# Patient Record
Sex: Female | Born: 1955 | Race: Black or African American | Hispanic: No | Marital: Single | Smoking: Current some day smoker
Health system: Southern US, Community
[De-identification: ages and names within clinical notes are randomized; demographics above are authoritative.]

## PROBLEM LIST (undated history)

## (undated) DIAGNOSIS — R5383 Other fatigue: Secondary | ICD-10-CM

## (undated) DIAGNOSIS — G2401 Drug induced subacute dyskinesia: Secondary | ICD-10-CM

## (undated) DIAGNOSIS — E785 Hyperlipidemia, unspecified: Secondary | ICD-10-CM

## (undated) DIAGNOSIS — M1990 Unspecified osteoarthritis, unspecified site: Secondary | ICD-10-CM

## (undated) DIAGNOSIS — F209 Schizophrenia, unspecified: Secondary | ICD-10-CM

## (undated) DIAGNOSIS — G4730 Sleep apnea, unspecified: Secondary | ICD-10-CM

## (undated) DIAGNOSIS — R609 Edema, unspecified: Secondary | ICD-10-CM

## (undated) DIAGNOSIS — R5381 Other malaise: Secondary | ICD-10-CM

## (undated) DIAGNOSIS — N3281 Overactive bladder: Secondary | ICD-10-CM

## (undated) DIAGNOSIS — R1310 Dysphagia, unspecified: Secondary | ICD-10-CM

## (undated) DIAGNOSIS — I10 Essential (primary) hypertension: Secondary | ICD-10-CM

## (undated) DIAGNOSIS — J45909 Unspecified asthma, uncomplicated: Secondary | ICD-10-CM

## (undated) DIAGNOSIS — K219 Gastro-esophageal reflux disease without esophagitis: Secondary | ICD-10-CM

## (undated) DIAGNOSIS — R4182 Altered mental status, unspecified: Secondary | ICD-10-CM

## (undated) DIAGNOSIS — F0280 Dementia in other diseases classified elsewhere without behavioral disturbance: Secondary | ICD-10-CM

## (undated) DIAGNOSIS — A6000 Herpesviral infection of urogenital system, unspecified: Secondary | ICD-10-CM

## (undated) DIAGNOSIS — E119 Type 2 diabetes mellitus without complications: Secondary | ICD-10-CM

## (undated) DIAGNOSIS — G20A1 Parkinson's disease without dyskinesia, without mention of fluctuations: Secondary | ICD-10-CM

## (undated) HISTORY — DX: Other malaise: R53.81

## (undated) HISTORY — DX: Hyperlipidemia, unspecified: E78.5

## (undated) HISTORY — DX: Edema, unspecified: R60.9

## (undated) HISTORY — DX: Type 2 diabetes mellitus without complications: E11.9

## (undated) HISTORY — PX: CHOLECYSTECTOMY: SHX55

## (undated) HISTORY — PX: ABDOMINAL HYSTERECTOMY: SHX81

## (undated) HISTORY — DX: Schizophrenia, unspecified: F20.9

## (undated) HISTORY — DX: Altered mental status, unspecified: R41.82

## (undated) HISTORY — DX: Other fatigue: R53.83

## (undated) HISTORY — DX: Herpesviral infection of urogenital system, unspecified: A60.00

## (undated) HISTORY — PX: FOOT SURGERY: SHX648

## (undated) HISTORY — PX: GANGLION CYST EXCISION: SHX1691

## (undated) HISTORY — DX: Unspecified osteoarthritis, unspecified site: M19.90

## (undated) HISTORY — DX: Essential (primary) hypertension: I10

## (undated) HISTORY — PX: TONSILLECTOMY: SUR1361

---

## 2004-01-05 ENCOUNTER — Emergency Department: Payer: Self-pay | Admitting: General Practice

## 2004-02-11 ENCOUNTER — Emergency Department: Payer: Self-pay | Admitting: Emergency Medicine

## 2004-04-15 ENCOUNTER — Ambulatory Visit: Payer: Self-pay | Admitting: Family Medicine

## 2004-08-10 ENCOUNTER — Ambulatory Visit: Payer: Self-pay | Admitting: Orthopedic Surgery

## 2004-12-15 ENCOUNTER — Ambulatory Visit: Payer: Self-pay | Admitting: Internal Medicine

## 2005-04-12 ENCOUNTER — Ambulatory Visit: Payer: Self-pay

## 2005-04-12 LAB — HM MAMMOGRAPHY: HM Mammogram: NORMAL

## 2006-03-31 ENCOUNTER — Ambulatory Visit: Payer: Self-pay | Admitting: Gastroenterology

## 2006-09-07 ENCOUNTER — Ambulatory Visit: Payer: Self-pay | Admitting: Gastroenterology

## 2006-09-07 LAB — HM COLONOSCOPY

## 2006-11-01 ENCOUNTER — Emergency Department: Payer: Self-pay | Admitting: Emergency Medicine

## 2006-11-01 ENCOUNTER — Other Ambulatory Visit: Payer: Self-pay

## 2006-11-21 ENCOUNTER — Ambulatory Visit: Payer: Self-pay | Admitting: Family Medicine

## 2007-01-25 ENCOUNTER — Ambulatory Visit: Payer: Self-pay | Admitting: Family Medicine

## 2007-04-12 ENCOUNTER — Other Ambulatory Visit: Payer: Self-pay

## 2007-04-12 ENCOUNTER — Emergency Department: Payer: Self-pay | Admitting: Emergency Medicine

## 2007-12-29 ENCOUNTER — Ambulatory Visit: Payer: Self-pay | Admitting: Family Medicine

## 2008-01-26 ENCOUNTER — Ambulatory Visit: Payer: Self-pay | Admitting: Family Medicine

## 2008-04-04 ENCOUNTER — Ambulatory Visit: Payer: Self-pay | Admitting: Family Medicine

## 2008-04-05 ENCOUNTER — Ambulatory Visit: Payer: Self-pay | Admitting: Family Medicine

## 2008-04-25 ENCOUNTER — Ambulatory Visit: Payer: Self-pay | Admitting: Family Medicine

## 2008-09-26 ENCOUNTER — Ambulatory Visit: Payer: Self-pay | Admitting: Family Medicine

## 2008-10-21 ENCOUNTER — Emergency Department: Payer: Self-pay | Admitting: Unknown Physician Specialty

## 2009-01-27 ENCOUNTER — Ambulatory Visit: Payer: Self-pay | Admitting: Family Medicine

## 2009-07-11 ENCOUNTER — Ambulatory Visit: Payer: Self-pay | Admitting: Family Medicine

## 2010-06-11 LAB — HM PAP SMEAR: HM Pap smear: NORMAL

## 2010-07-07 ENCOUNTER — Ambulatory Visit: Payer: Self-pay

## 2010-08-12 ENCOUNTER — Emergency Department: Payer: Self-pay | Admitting: Emergency Medicine

## 2010-08-31 ENCOUNTER — Ambulatory Visit: Payer: Self-pay | Admitting: Family Medicine

## 2012-01-05 ENCOUNTER — Emergency Department: Payer: Self-pay

## 2012-01-05 LAB — URINALYSIS, COMPLETE
Bacteria: NONE SEEN
Bilirubin,UR: NEGATIVE
Blood: NEGATIVE
Ketone: NEGATIVE
RBC,UR: <1 /HPF (ref 0–5)
Squamous Epithelial: <1
WBC UR: 1 /HPF (ref 0–5)

## 2012-01-05 LAB — CBC
HCT: 38.3 % (ref 35.0–47.0)
HGB: 11.7 g/dL — ABNORMAL LOW (ref 12.0–16.0)
MCH: 20.8 pg — ABNORMAL LOW (ref 26.0–34.0)
MCHC: 30.5 g/dL — ABNORMAL LOW (ref 32.0–36.0)
MCV: 68 fL — ABNORMAL LOW (ref 80–100)
WBC: 6.3 10*3/uL (ref 3.6–11.0)

## 2012-01-05 LAB — COMPREHENSIVE METABOLIC PANEL
Albumin: 3.9 g/dL (ref 3.4–5.0)
Alkaline Phosphatase: 95 U/L (ref 50–136)
Anion Gap: 5 — ABNORMAL LOW (ref 7–16)
Calcium, Total: 9.1 mg/dL (ref 8.5–10.1)
Chloride: 106 mmol/L (ref 98–107)
Creatinine: 0.76 mg/dL (ref 0.60–1.30)
EGFR (African American): >60
Glucose: 109 mg/dL — ABNORMAL HIGH (ref 65–99)
Potassium: 3.9 mmol/L (ref 3.5–5.1)
SGOT(AST): 17 U/L (ref 15–37)
SGPT (ALT): 20 U/L (ref 12–78)
Total Protein: 7.3 g/dL (ref 6.4–8.2)

## 2012-02-08 ENCOUNTER — Ambulatory Visit: Payer: Self-pay | Admitting: Family Medicine

## 2012-08-11 LAB — COMPREHENSIVE METABOLIC PANEL
Alkaline Phosphatase: 82 U/L (ref 50–136)
Anion Gap: 6 — ABNORMAL LOW (ref 7–16)
BUN: 10 mg/dL (ref 7–18)
Bilirubin,Total: 0.6 mg/dL (ref 0.2–1.0)
Calcium, Total: 9.1 mg/dL (ref 8.5–10.1)
Creatinine: 0.66 mg/dL (ref 0.60–1.30)
EGFR (African American): >60
EGFR (Non-African Amer.): >60
Glucose: 103 mg/dL — ABNORMAL HIGH (ref 65–99)
Potassium: 3.3 mmol/L — ABNORMAL LOW (ref 3.5–5.1)
SGPT (ALT): 19 U/L (ref 12–78)
Sodium: 139 mmol/L (ref 136–145)

## 2012-08-11 LAB — URINALYSIS, COMPLETE
Bilirubin,UR: NEGATIVE
Blood: NEGATIVE
Glucose,UR: NEGATIVE mg/dL (ref 0–75)
Nitrite: NEGATIVE
Squamous Epithelial: 1

## 2012-08-11 LAB — CBC
HCT: 35.9 % (ref 35.0–47.0)
HGB: 11.2 g/dL — ABNORMAL LOW (ref 12.0–16.0)
MCH: 20.7 pg — ABNORMAL LOW (ref 26.0–34.0)
MCHC: 31.3 g/dL — ABNORMAL LOW (ref 32.0–36.0)
MCV: 66 fL — ABNORMAL LOW (ref 80–100)
Platelet: 180 10*3/uL (ref 150–440)
RBC: 5.43 10*6/uL — ABNORMAL HIGH (ref 3.80–5.20)

## 2012-08-11 LAB — CK TOTAL AND CKMB (NOT AT ARMC)
CK, Total: 204 U/L (ref 21–215)
CK-MB: 4 ng/mL — ABNORMAL HIGH (ref 0.5–3.6)

## 2012-08-12 ENCOUNTER — Inpatient Hospital Stay: Payer: Self-pay | Admitting: Psychiatry

## 2012-08-14 LAB — BASIC METABOLIC PANEL
Anion Gap: 1 — ABNORMAL LOW (ref 7–16)
BUN: 10 mg/dL (ref 7–18)
Chloride: 104 mmol/L (ref 98–107)
EGFR (Non-African Amer.): >60
Glucose: 98 mg/dL (ref 65–99)

## 2012-08-16 LAB — BASIC METABOLIC PANEL
BUN: 17 mg/dL (ref 7–18)
Co2: 29 mmol/L (ref 21–32)
Creatinine: 0.84 mg/dL (ref 0.60–1.30)
Glucose: 89 mg/dL (ref 65–99)

## 2012-08-30 LAB — LIPID PANEL
HDL Cholesterol: 57 mg/dL (ref 40–60)
Ldl Cholesterol, Calc: 152 mg/dL — ABNORMAL HIGH (ref 0–100)
VLDL Cholesterol, Calc: 26 mg/dL (ref 5–40)

## 2012-08-30 LAB — BASIC METABOLIC PANEL
Co2: 33 mmol/L — ABNORMAL HIGH (ref 21–32)
EGFR (African American): >60
EGFR (Non-African Amer.): >60
Glucose: 102 mg/dL — ABNORMAL HIGH (ref 65–99)
Osmolality: 267 (ref 275–301)
Sodium: 133 mmol/L — ABNORMAL LOW (ref 136–145)

## 2013-02-09 ENCOUNTER — Ambulatory Visit: Payer: Self-pay | Admitting: Podiatry

## 2013-02-16 ENCOUNTER — Ambulatory Visit: Payer: Self-pay | Admitting: Podiatry

## 2013-02-23 ENCOUNTER — Ambulatory Visit: Payer: Self-pay | Admitting: Podiatry

## 2013-03-02 ENCOUNTER — Ambulatory Visit: Payer: Self-pay | Admitting: Podiatry

## 2013-03-16 ENCOUNTER — Emergency Department: Payer: Self-pay | Admitting: Emergency Medicine

## 2013-03-16 LAB — BASIC METABOLIC PANEL
Anion Gap: 6 — ABNORMAL LOW (ref 7–16)
BUN: 14 mg/dL (ref 7–18)
CALCIUM: 8.8 mg/dL (ref 8.5–10.1)
CHLORIDE: 99 mmol/L (ref 98–107)
CO2: 29 mmol/L (ref 21–32)
Creatinine: 0.84 mg/dL (ref 0.60–1.30)
EGFR (African American): >60
Glucose: 145 mg/dL — ABNORMAL HIGH (ref 65–99)
Osmolality: 271 (ref 275–301)
POTASSIUM: 4 mmol/L (ref 3.5–5.1)
Sodium: 134 mmol/L — ABNORMAL LOW (ref 136–145)

## 2013-03-16 LAB — CBC
HCT: 37.6 % (ref 35.0–47.0)
HGB: 12.2 g/dL (ref 12.0–16.0)
MCH: 22 pg — AB (ref 26.0–34.0)
MCHC: 32.5 g/dL (ref 32.0–36.0)
MCV: 68 fL — ABNORMAL LOW (ref 80–100)
Platelet: 185 10*3/uL (ref 150–440)
RBC: 5.57 10*6/uL — AB (ref 3.80–5.20)
RDW: 17.1 % — AB (ref 11.5–14.5)
WBC: 7.4 10*3/uL (ref 3.6–11.0)

## 2013-03-16 LAB — TROPONIN I: Troponin-I: <0.02 ng/mL

## 2013-03-20 ENCOUNTER — Ambulatory Visit: Payer: Self-pay | Admitting: Podiatry

## 2013-03-27 ENCOUNTER — Ambulatory Visit: Payer: Self-pay | Admitting: Podiatry

## 2013-03-30 ENCOUNTER — Encounter: Payer: Self-pay | Admitting: Podiatry

## 2013-03-30 ENCOUNTER — Ambulatory Visit (INDEPENDENT_AMBULATORY_CARE_PROVIDER_SITE_OTHER): Payer: Medicaid Other | Admitting: Podiatry

## 2013-03-30 ENCOUNTER — Ambulatory Visit (INDEPENDENT_AMBULATORY_CARE_PROVIDER_SITE_OTHER): Payer: Medicaid Other

## 2013-03-30 VITALS — BP 124/73 | HR 86 | Resp 16 | Ht 72.0 in | Wt 312.0 lb

## 2013-03-30 DIAGNOSIS — M779 Enthesopathy, unspecified: Secondary | ICD-10-CM

## 2013-03-30 DIAGNOSIS — B351 Tinea unguium: Secondary | ICD-10-CM

## 2013-03-30 DIAGNOSIS — M19079 Primary osteoarthritis, unspecified ankle and foot: Secondary | ICD-10-CM

## 2013-03-30 DIAGNOSIS — Q6650 Congenital pes planus, unspecified foot: Secondary | ICD-10-CM

## 2013-03-30 MED ORDER — TRIAMCINOLONE ACETONIDE 10 MG/ML IJ SUSP
10.0000 mg | Freq: Once | INTRAMUSCULAR | Status: AC
  Administered 2013-03-30: 10 mg

## 2013-03-30 NOTE — Progress Notes (Signed)
Subjective:     Patient ID: Christy BashMinnie B Pecora, female   DOB: 09/04/1955, 58 y.o.   MRN: [id number]  Foot Pain   patient presents with caregiver with grade pain in the ankle region of both feet and extreme obesity swelling and difficulty with ambulation   Review of Systems  All other systems reviewed and are negative.       Objective:   Physical Exam  Nursing note and vitals reviewed. Constitutional: She is oriented to person, place, and time.  Cardiovascular: Intact distal pulses.   Musculoskeletal: Normal range of motion.  Neurological: She is oriented to person, place, and time.  Skin: Skin is dry.   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 Reappoint as needed

## 2013-03-30 NOTE — Progress Notes (Signed)
   Subjective:    Patient ID: Christy [name], female    DOB: 09/04/1955, 58 y.o.   MRN: [id number]  HPI Comments: The left  Foot has the most pain , it hurts all over, flat feet and no arch support .   Foot Pain Associated symptoms include numbness and weakness.      Review of Systems  Constitutional:       Sweating   HENT:       Sinus problems  Trouble swallowing Sneezing   Eyes: Positive for itching.  Respiratory: Positive for wheezing.   Gastrointestinal: Positive for constipation.  Allergic/Immunologic: Positive for environmental allergies.  Neurological: Positive for weakness, light-headedness and numbness.  Hematological:       Slow to heal   Psychiatric/Behavioral: Positive for hallucinations and behavioral problems.       Objective:   Physical Exam        Assessment & Plan:

## 2013-05-04 ENCOUNTER — Telehealth: Payer: Self-pay | Admitting: *Deleted

## 2013-05-04 ENCOUNTER — Ambulatory Visit (INDEPENDENT_AMBULATORY_CARE_PROVIDER_SITE_OTHER): Payer: Medicaid Other | Admitting: *Deleted

## 2013-05-04 ENCOUNTER — Encounter: Payer: Self-pay | Admitting: Podiatry

## 2013-05-04 VITALS — Resp 16 | Ht 69.0 in | Wt 305.6 lb

## 2013-05-04 DIAGNOSIS — Q6650 Congenital pes planus, unspecified foot: Secondary | ICD-10-CM

## 2013-05-04 NOTE — Progress Notes (Signed)
Measured for diabetic shoes. 

## 2013-05-04 NOTE — Telephone Encounter (Signed)
CALLED AND SPOKE WITH PT REGARDING DIABETIC SHOES. PT HAD CAME IN FOR APPT. TODAY WANTED DIABETIC SHOES, WAS MEASURED FOR SHOES, NO PAPERWORK WAS SENT TO PRIMARY DR AS OF YET FOR APPROVAL, DR REGAL HAS NOT FILLED OUT FORM FOR SHOES AS OF YET, AND PT HAS MEDICAID WHICH WILL NOT COVER DIABETIC SHOES. TOLD PT WE WOULD HAVE TO SEND HER TO [facility] FOR RX FOR SHOES. PT STATES SHE WILL TALK TO HER COUSIN TO SEE IF SHE WILL PAY FOR THEM AND GET THEM FROM OUR OFFICE. PT STATES SHE WILL CALL BACK TO LET US KNOW WHAT SHE IS GOING TO DO.

## 2013-05-04 NOTE — Progress Notes (Signed)
Subjective:     Patient ID: Christy [name], female   DOB: 09/04/1955, 58 y.o.   MRN: [id number]  HPI patient presents for diabetic shoe measurements at this time   Review of Systems     Objective:   Physical Exam Neurovascular status unchanged with obese patient is long-term history of diabetes and has at risk possibilities for ulceration    Assessment:     At risk diabetic with structural damage and obesity    Plan:     Scanned and casted for diabetic shoes to try to prevent future ulceration

## 2013-05-15 ENCOUNTER — Telehealth: Payer: Self-pay | Admitting: *Deleted

## 2013-05-15 NOTE — Telephone Encounter (Signed)
SPOKE WITH Christy [name] AND SHE GAVE ME THE APPROVAL TO SPEAK WITH HER COUSIN Christy [name] REGARDING HER DIABETIC SHOES. TOLD Christy [name] MEDICAID DOES NOT COVER SHOES AND COST IS $140.00 FOR SHOES AND $59.00 FOR EACH PAIR OF INSERTS FOR SHOES. Christy [name] SAID THEY ARE CHECKING TO SEE IF Christy [name] HAS MEDICARE AND WILL GET BACK IN TOUCH WITH US REGARDING HER INSURANCE INFORMATION AND TO SEE IF WE WILL NEED TO ORDER DIABETIC SHOES.

## 2013-07-16 DIAGNOSIS — IMO0001 Reserved for inherently not codable concepts without codable children: Secondary | ICD-10-CM | POA: Insufficient documentation

## 2013-07-19 ENCOUNTER — Ambulatory Visit: Payer: Self-pay | Admitting: Family Medicine

## 2013-07-24 ENCOUNTER — Ambulatory Visit: Payer: Self-pay | Admitting: Gastroenterology

## 2013-07-31 ENCOUNTER — Ambulatory Visit: Payer: Self-pay | Admitting: Gastroenterology

## 2013-07-31 HISTORY — PX: UPPER GI ENDOSCOPY: SHX6162

## 2013-08-03 LAB — PATHOLOGY REPORT

## 2013-08-21 ENCOUNTER — Ambulatory Visit: Payer: Self-pay | Admitting: Family Medicine

## 2013-09-13 ENCOUNTER — Ambulatory Visit: Payer: Self-pay | Admitting: Family Medicine

## 2013-10-04 ENCOUNTER — Ambulatory Visit: Payer: Self-pay | Admitting: Family Medicine

## 2013-10-05 ENCOUNTER — Encounter: Payer: Self-pay | Admitting: *Deleted

## 2013-10-05 NOTE — Progress Notes (Signed)
Diabetic shoes not approved by medicaid. Pt said she would call me back if she could get someone to help her with the cost of the shoes. No one at time is able to help with cost with shoes at time. Pt will need to sch appt if she would like to try for diabetic shoes again so pt can be scanned for inserts.

## 2013-11-14 ENCOUNTER — Ambulatory Visit: Payer: Self-pay | Admitting: Family Medicine

## 2014-04-12 ENCOUNTER — Ambulatory Visit (INDEPENDENT_AMBULATORY_CARE_PROVIDER_SITE_OTHER): Payer: Medicaid Other | Admitting: Podiatry

## 2014-04-12 DIAGNOSIS — M79676 Pain in unspecified toe(s): Secondary | ICD-10-CM

## 2014-04-12 DIAGNOSIS — M779 Enthesopathy, unspecified: Secondary | ICD-10-CM

## 2014-04-12 DIAGNOSIS — B351 Tinea unguium: Secondary | ICD-10-CM

## 2014-04-12 NOTE — Progress Notes (Signed)
Subjective:  °  ° Patient ID: Christy [name], female   DOB: 01/11/1956, 58 y.o.   MRN: [id number] ° °HPI patient presents with painful nails 1-5 both feet with an obese female who cannot cut them herself and presents with generalized depression of the arch with chronic pain ° ° °Review of Systems ° °   °Objective:  ° Physical Exam °Neurovascular status unchanged from previous visit with significant limitation of motion subtalar and  ankle joint bilateral with thick nail disease 1-5 both feet that are painful when pressed and making walking and shoe gear difficult.    °Assessment:  °   °Painful mycotic nail infections 1-5 both feet with ingrown toenails and chronic foot structural issues with chronic pain  °   °Plan:  °   °Reviewed chronic pain and utilization of different types of shoes to try to support the arch and spent time discussing this with her. Debrided nailbeds 1-5 both feet with no iatrogenic bleeding noted  °   ° ° °

## 2014-05-06 LAB — HEMOGLOBIN A1C: Hgb A1c MFr Bld: 11.5 % — AB (ref 4.0–6.0)

## 2014-05-06 LAB — HEPATIC FUNCTION PANEL
ALT: 21 U/L (ref 7–35)
AST: 13 U/L (ref 13–35)
Alkaline Phosphatase: 84 U/L (ref 25–125)

## 2014-05-06 LAB — CBC AND DIFFERENTIAL
HEMATOCRIT: 39 % (ref 36–46)
Hemoglobin: 12.5 g/dL (ref 12.0–16.0)
NEUTROS ABS: 5 /uL
Platelets: 211 10*3/uL (ref 150–399)
WBC: 9.4 10*3/mL

## 2014-05-06 LAB — BASIC METABOLIC PANEL
BUN: 10 mg/dL (ref 4–21)
Creatinine: 0.8 mg/dL (ref 0.5–1.1)
GLUCOSE: 310 mg/dL
POTASSIUM: 4 mmol/L (ref 3.4–5.3)
Sodium: 130 mmol/L — AB (ref 137–147)

## 2014-05-06 LAB — TSH: TSH: 4.19 u[IU]/mL (ref 0.41–5.90)

## 2014-05-14 ENCOUNTER — Encounter
Admit: 2014-05-14 | Disposition: A | Discharge status: Home / Self Care | Payer: Self-pay | Attending: Family Medicine | Admitting: Family Medicine

## 2014-05-17 NOTE — H&P (Signed)
PATIENT NAME:  Christy [name], Christy [name] MR#:  [id number] DATE OF BIRTH:  09/04/1955  DATE OF ADMISSION:  08/12/2012 DATE OF ASSESSMENT: 08/14/2012   REFERRING PHYSICIAN: Emergency Room doctor  ADMITTING PHYSICIAN: Christy RanaSurya Challa, MD  ATTENDING PHYSICIAN: Christy Croker [name]. Yaretzy Olazabal, MD    IDENTIFYING DATA: Christy [name] is a 59 year old female with history of schizophrenia.   CHIEF COMPLAINT: "I need to go home."  HISTORY OF PRESENT ILLNESS:  Christy [name] has a long-standing history of mental illness. She is in the care of Christy [name] team. She had been doing very well while on Abilify.  Several months ago, the patient discontinued Abilify, as she felt that the medication makes her too sleepy, and she did not need a sleeping pill. She gradually decompensated.  Her [name] team worker brought her to the [name] disorganized and hallucinating. The patient initially refused to take any medications. She claims to be allergic to most of them. She reports that some of the medicines make her sleepy. Some other medicines make it very difficult for her to use her arms. She believes that there is absolutely nothing wrong with her, and she should be discharged to home.   PAST PSYCHIATRIC HISTORY: The [name], according to her many, many years ago. She does not remember many medication names but admits that at some point she was on Haldol monthly injections. She is not interested in taking Haldol or any medication for that matter now. She denies suicide attempt. She denies history of substance abuse.   FAMILY PSYCHIATRIC HISTORY: None reported.   PAST MEDICAL HISTORY: Peripheral edema, hypertension, hypokalemia, microcytic anemia.   ALLERGIES: No known drug allergies per our chart.  MEDICATIONS ON ADMISSION: Abilify 15 mg in the morning, 15 at night, omeprazole 20 mg daily, furosemide 40 mg daily, hydrochlorothiazide/triamterene, aspirin 81 mg, Valtrex 1000 mg daily.   SOCIAL HISTORY: She  is disabled. She lives by herself in an apartment.  Ordinarily, she cooks, clean and shops for herself with the help of [name] team. She completed 9th grade and then dropped out of school. She has never been married. She has no children. She has 1 brother in [place] area with whom she is close.   REVIEW OF SYSTEMS: CONSTITUTIONAL: No fevers or chills. Positive for gradual weight gain.  EYES: No double or blurred vision.  ENT: No hearing losses.  RESPIRATORY: No shortness of breath or cough.  CARDIOVASCULAR: Positive for lower extremity edema.  GASTROINTESTINAL: No abdominal pain, nausea, vomiting or diarrhea.  GENITOURINARY: No incontinence or frequency.  ENDOCRINE: No heat or cold intolerance.  LYMPHATIC: No anemia or easy bruising.  INTEGUMENTARY: No acne or rash.  MUSCULOSKELETAL: No muscle or joint pain.  NEUROLOGIC: No tingling or weakness.  PSYCHIATRIC: See history of present illness for details.   PHYSICAL EXAMINATION: VITAL SIGNS: Blood pressure 163/88, pulse 77, respirations 21, temperature 97.7.  GENERAL: This is an obese female in no acute distress.  HEENT: The pupils are equal, round and reactive to light. Sclerae are anicteric.  NECK: Supple, no thyromegaly.  LUNGS: Clear to auscultation, no dullness to percussion.  HEART: Regular rhythm and rate. No murmurs, rubs or gallops.  ABDOMEN: Soft, nontender, nondistended, positive bowel sounds  MUSCULOSKELETAL:  Normal muscle strength in all extremities.  SKIN: No rashes or bruises.  LYMPHATIC: No cervical adenopathy.  NEUROLOGIC: Cranial nerves II through XII are intact.   LABORATORY DATA: Chemistries are within normal limits. LFTs within normal limits. Troponins within normal limits.  CBC: White blood count 6.8, hemoglobin 11.2, hematocrit 35.9, platelets 180. Urinalysis is leukocyte esterase 1+ with 6 white blood cells per field.   MENTAL STATUS EXAMINATION ON ADMISSION: The patient is alert and oriented to person, place, and  somewhat to situation. She is pleasant, polite and cooperative. She is adequately groomed. She maintains good eye contact. Her speech is soft. Mood is fine with flat affect. Thought processing is slow. She denied thoughts of hurting herself. She is delusional and paranoid. She adamantly denies hearing any voices. Her cognition is grossly intact but difficult to assess. Her insight and judgment are poor.   SUICIDE RISK ASSESSMENT ON ADMISSION: This is a patient with a long history of psychosis but no mood symptoms who was admitted to the [name] floridly psychotic in the context of medication noncompliance.   DIAGNOSES: AXIS I: Paranoid schizophrenia.   AXIS II: Deferred.   AXIS III:  1.  Hypertension.  2.  Lower extremity edema.  3.  Gastroesophageal reflux disease. 4.  Anemia.   AXIS IV: Mental illness, primary support, treatment compliance.   AXIS V: Global Assessment of Functioning on admission 35.   PLAN: The patient was admitted to [facility] Medicine Unit for safety, stabilization and medication management. She was initially placed on suicide precautions and was closely monitored for any unsafe behaviors. She underwent full psychiatric and risk assessment. She received pharmacotherapy, individual and group psychotherapy, substance abuse counseling and support from therapeutic milieu.   1.  Psychosis: The patient refuses to take Abilify. She was started on Risperdal by admitting psychiatrist. 2.  Medicine consultant is kindly helping [place] to address medical problems.  3.  Disposition:  She will be discharged to home. She will follow up with [name] team.     ____________________________ Ellin Goodie. Jennet Maduro, MD jbp:cb D: 08/14/2012 15:59:46 ET T: 08/14/2012 16:21:33 ET JOB#: [id number]  cc: Karey Stucki [name]. Jennet Maduro, MD, <Dictator> Shari Prows MD ELECTRONICALLY SIGNED 08/17/2012 20:19

## 2014-05-17 NOTE — Consult Note (Signed)
PATIENT NAME:  Christy [name], Christy [name] MR#:  [id number] DATE OF BIRTH:  09/04/1955  DATE OF CONSULTATION:  08/12/2012  REFERRING PHYSICIAN:      Margarita RanaSurya Challa, M.D.  CONSULTING PHYSICIAN:  Carney CornersAmir M. Nicolet Griffy, MD  REASON FOR CONSULTATION: The patient was admitted with psychosis, has history of hypertension and she is complaining of peripheral leg edema.   HISTORY OF PRESENT ILLNESS:  Christy [name] is a 59 year old African American female with past medical history of systemic hypertension, peripheral leg edema, microcytic hypochromic anemia with baseline hemoglobin of 11, history of colonoscopy with findings of bleeding internal hemorrhoids. The patient was admitted to the hospital for evaluation of psychosis. She is complaining also of leg edema. According to the patient, the leg edema is chronic for many years. She was treated at one time with hydrochlorothiazide with triamterene,  then she stopped it and then she was placed on furosemide or Lasix. The patient tells me that she stopped this medicine as well. At this point, in the patient denies any other symptoms. No chest pain. No shortness of breath. No cough. No GI or urinary complaints.   REVIEW OF SYSTEMS:   A 10-point system review was negative, apart from the leg edema.   PHYSICAL EXAMINATION: VITAL SIGNS: Her blood pressure 148/68, pulse 65, respiratory rate 18, temperature 98.2.  GENERAL APPEARANCE: This is a middle-aged female sitting at the bedside, in no acute distress.  HEAD AND NECK: No pallor. No icterus. No cyanosis.  HEART: Normal S1, S2. No S3, S4. No murmur. No gallop. No carotid bruits.  RESPIRATORY: Normal breathing pattern without use of accessory muscles. No rales. No wheezing.  ABDOMEN: Soft, obese without tenderness. No hepatosplenomegaly.  SKIN: No ulcers. No subcutaneous nodules.  EXTREMITIES: She has +2 peripheral edema in lower extremities. The edema is up to just below the knee area.  MUSCULOSKELETAL: No joint swelling. No clubbing.   NEUROLOGIC: Cranial nerves II through XII are intact. No focal motor deficit.  PSYCHIATRIC: The patient is alert, oriented to place and people. Mood and affect; she looks cheerful and happy.   LABORATORY FINDINGS: Ultrasound of lower extremities showed no evidence of deep vein thrombosis in either leg. Her serum glucose 103, BUN 10, creatinine 0.6, sodium 139, potassium 3.3. Liver function tests and liver transaminases were normal. Troponin less than 0.02. CBC showed white count of 6000, hemoglobin 11.2. Her baseline hemoglobin is 11, MCV, MCH and MCHC are low at 66, 20, 31, respectively. Urinalysis was unremarkable.   ASSESSMENT: 1.  Peripheral leg edema, chronic. The patient is noncompliant with her medications.  2.  Systemic hypertension.  3.  Psychosis. 4.  Microcytic hypochromic anemia.   5.  Mild hypokalemia.  6.  History of cholecystectomy and hysterectomy.   PLAN: We will add potassium supplementation daily. Resume Lasix, will start at 40 mg twice a day. Once the peripheral edema is better, this can be reduced to once a day. I will recheck her basic metabolic profile in a couple of days to ensure improvement of her potassium. Hopefully, with the addition of a diuretic, her blood pressure will be better controlled as well. Otherwise, she may need a blood pressure medication as well.   ____________________________ Carney CornersAmir M. Rudene Rearwish, MD amd:nts D: 08/12/2012 23:08:07 ET T: 08/13/2012 03:17:14 ET JOB#: [id number]  cc: Carney CornersAmir M. Rudene Rearwish, MD, <Dictator> Zollie ScaleAMIR M Taejah Ohalloran MD ELECTRONICALLY SIGNED 08/13/2012 6:16

## 2014-05-17 NOTE — H&P (Signed)
PATIENT NAME:  Christy, [name] MR#:  [id number] DATE OF BIRTH:  [birth year]  DATE OF ADMISSION:  08/12/2012  PLACE OF DICTATION:  [facility], [place], [place].  SEX:  Female.  RACE:  African American.  AGE:  59 years.  INITIAL ASSESSMENT AND PSYCHIATRIC EVALUATION  IDENTIFYING INFORMATION:  The patient is a 59 year old African American female, not employed, last worked many years ago and is single, never married and lives by herself in an apartment.  The patient was being followed by [facility] and was brought by caseworker with a chief complaint "medically not doing well" and has problems with cellulitis of her right leg and ankle and has edema of 3+ and rambling speech and psychotic.    HISTORY OF PRESENT ILLNESS:  The patient reports "that she was brought here for help and cannot give details and she is a poor historian."    PAST PSYCHIATRIC HISTORY:  The patient reports that she had been inpatient in psychiatry on many occasions and she cannot remember the details. She is being followed and also given medications for her mental illness and could not tell the exact reason why she was brought here.  According to information obtained from the chart, the patient was on Abilify stating that she was responding to internal stimuli and is psychotic.  The patient denies any history of suicide attempt.    FAMILY HITSORY OF MENTAL ILLNESS:  No known mental illness.  No history of suicide in the family.    FAMILY HISTORY:  Raised by parents.  Father worked.  Father died and she reports that both of his legs were cut off.  Mother retired.  Also, all of her family members are dead except for one brother who lives in [place], but in touch with him.    PERSONAL HISTORY:  Dropped out in 9th grade.  Born in [place].  No GED.    WORK HISTORY:  Not employed and last worked many years ago.  Very poor historian.   ALCOHOL AND DRUGS:  Has occasional drink of alcohol.  Denies  street or prescription drug abuse.  Does admit smoking nicotine cigarettes occasionally.    MARRIAGES:  Never married.  No children.    PAST MEDICAL HISTORY:  Has high blood pressure, diabetes mellitus.   PAST SURGICAL HISTORY:  No major surgeries.  No major injuries.  No history of motor vehicle accident.  Never been unconscious.  ALLERGIES:  ALLERGIC TO MANY MEDICATIONS ACCORDING TO HER, BUT SHE CANNOT GIVE THE DETAILS.    However, being followed by Dr. Sullivan Lone at [facility].  Last appointment was a month ago.  Next appointment is made.  PHYSICAL EXAMINATION:  VITAL SIGNS:  Temperature is 98.2, pulse is 70 per minute and regular, respiratory rate is 20 per minute and regular.  Blood pressure is 140/70 mmHg.   HEENT:  Head is normocephalic, atraumatic.  Eyes: PERRLA.  Fundi bilaterally benign.  EOMs are intact.  Tympanic membranes intact.  No exudates. NECK:  Supple without any organomegaly, lymphadenopathy or thyromegaly.  CHEST:  Normal expansion, normal breath sounds heard.  HEART:  Normal S1, S2, without any murmurs or gallops. ABDOMEN:  Very obese, soft. Bowel sounds heard. RECTAL AND PELVIC:  Deferred. NEUROLOGIC:  Gait not tested as the patient is in a wheelchair.  Has cellulitis of the right ankle and right foot and pedal edema.  Left leg shows some cellulitis with edema.  The patient uses the wheelchair for the same.  Romberg not tested.  Cranial nerves II through XII grossly intact and normal.    MENTAL STATUS EXAMINATION:  The patient dressed in hospital clothes.  Alert.  She knew it was [place].  Did not know capital of [place].  She knew [place] is the capital of , name of the current president, but not any previous presidents.  Cognition and knowledge  are  below average.  General knowledge of information is poor and probably adequate for her level of education.  Denies feeling depressed.  Denies feeling hopeless or helpless.  Denies feeling  worthless or useless.  Admits that she does feel paranoid and suspicious and has rambling speech.  Denies hearing voices.  Denies seeing things.  Could not spell the word world and says all she knew is it started with W.  She knew there were 4 quarters, but could not count the rest of the money as she said she is very poor with them.  Her memory and recall are poor and she could not remember more than 1 object after several minutes and general knowledge information is rather below average.  Denies any ideas or plans to hurt herself of others.  Insight and judgment guarded.    IMPRESSION:  Schizophrenia, chronic paranoid, in exacerbation.  Diabetes mellitus and hypertension with cellulitis of the right ankle and left side.  Exogenous obesity, moderate.    During the stay in the hospital the patient will be continued on her other medications that she has been taking on an outpatient basis and these will be adjusted.  A hospitalist or medical consult will be requested so that she can be helped with her cellulitis.  Medications will be adjusted and then she will be discharged after she is stabilized physically and mentally with adequate follow-up appointment in the community.     ____________________________ Jannet MantisSurya K. Guss Bundehalla, MD skc:ea D: 08/12/2012 21:00:52 ET T: 08/13/2012 00:06:02 ET JOB#: [id number]  cc: Monika SalkSurya K. Guss Bundehalla, MD, <Dictator> Beau FannySURYA K Merle Cirelli MD ELECTRONICALLY SIGNED 08/13/2012 20:30

## 2014-05-21 ENCOUNTER — Ambulatory Visit
Admit: 2014-05-21 | Disposition: A | Discharge status: Home / Self Care | Payer: Self-pay | Attending: Family Medicine | Admitting: Family Medicine

## 2014-05-22 ENCOUNTER — Other Ambulatory Visit: Payer: Self-pay | Admitting: Family Medicine

## 2014-05-22 DIAGNOSIS — Z1231 Encounter for screening mammogram for malignant neoplasm of breast: Secondary | ICD-10-CM

## 2014-06-07 ENCOUNTER — Ambulatory Visit: Payer: Self-pay | Admitting: Dietician

## 2014-06-10 ENCOUNTER — Ambulatory Visit
Admission: RE | Admit: 2014-06-10 | Discharge: 2014-06-10 | Disposition: A | Discharge status: Home / Self Care | Payer: Medicaid Other | Source: Ambulatory Visit | Attending: Family Medicine | Admitting: Family Medicine

## 2014-06-10 DIAGNOSIS — Z1231 Encounter for screening mammogram for malignant neoplasm of breast: Secondary | ICD-10-CM | POA: Insufficient documentation

## 2014-06-14 ENCOUNTER — Encounter: Payer: Medicaid Other | Attending: Family Medicine | Admitting: Dietician

## 2014-06-14 VITALS — BP 124/82 | Ht 69.5 in | Wt 329.6 lb

## 2014-06-14 DIAGNOSIS — I10 Essential (primary) hypertension: Secondary | ICD-10-CM | POA: Insufficient documentation

## 2014-06-14 DIAGNOSIS — E119 Type 2 diabetes mellitus without complications: Secondary | ICD-10-CM | POA: Insufficient documentation

## 2014-06-14 DIAGNOSIS — F209 Schizophrenia, unspecified: Secondary | ICD-10-CM | POA: Insufficient documentation

## 2014-06-14 NOTE — Patient Instructions (Signed)
Try some Sun Chips (orange or red bag) instead of Doritos or potato chips. Add a few pecans or almonds with your cereal or oatmeal at breakfast. Eat 1/2 can of beefaroni or ravioli, and save the rest for the next day. Add a vegetable like broccoli with it.  Try 2% milk and cheese. Try a Development worker, communityLean Cuisine or Healthy Choice or Smart Ones meal for lunch or supper, and add a vegetable or fruit with it.  For something sweet, try sugar free pudding or jello, fruit, or a few vanilla wafers.

## 2014-06-14 NOTE — Progress Notes (Signed)
Instructed pt on basic meal planning using plate method. Recorded her verbal diet history report and discussed specific recommendations to improve nutrient balance in her meals. Wrote individualized menus based on patient's food preferences.   Educational materials provided:  Simple 1-page food lists with balanced plate          3-day menus          Instruction list/ goals

## 2014-07-01 ENCOUNTER — Other Ambulatory Visit: Payer: Self-pay | Admitting: Family Medicine

## 2014-07-03 ENCOUNTER — Telehealth: Payer: Self-pay | Admitting: Family Medicine

## 2014-07-03 DIAGNOSIS — E1142 Type 2 diabetes mellitus with diabetic polyneuropathy: Secondary | ICD-10-CM

## 2014-07-03 NOTE — Telephone Encounter (Addendum)
Tammy with [facility] called stating pt has an appointment 07/15/14 for a nail trim.  They are requesting a referral, NPI and # of visits.  Pt has Medicaid and this auth/MJ

## 2014-07-12 ENCOUNTER — Ambulatory Visit: Payer: Medicaid Other

## 2014-07-15 ENCOUNTER — Ambulatory Visit (INDEPENDENT_AMBULATORY_CARE_PROVIDER_SITE_OTHER): Payer: Medicaid Other | Admitting: Podiatry

## 2014-07-15 DIAGNOSIS — M79676 Pain in unspecified toe(s): Secondary | ICD-10-CM

## 2014-07-15 DIAGNOSIS — B351 Tinea unguium: Secondary | ICD-10-CM | POA: Diagnosis not present

## 2014-07-15 NOTE — Progress Notes (Signed)
Subjective:     Patient ID: Christy [name], female   DOB: Aug 22, 1955, 59 y.o.   MRN: [id number]  HPI patient presents with painful nails 1-5 both feet with an obese female who cannot cut them herself and presents with generalized depression of the arch with chronic pain   Review of Systems     Objective:   Physical Exam Neurovascular status unchanged from previous visit with significant limitation of motion subtalar and  ankle joint bilateral with thick nail disease 1-5 both feet that are painful when pressed and making walking and shoe gear difficult.    Assessment:     Painful mycotic nail infections 1-5 both feet with ingrown toenails and chronic foot structural issues with chronic pain     Plan:     Reviewed chronic pain and utilization of different types of shoes to try to support the arch and spent time discussing this with her. Debrided nailbeds 1-5 both feet with no iatrogenic bleeding noted

## 2014-07-17 ENCOUNTER — Telehealth: Payer: Self-pay | Admitting: Family Medicine

## 2014-07-17 NOTE — Telephone Encounter (Signed)
Please review-aa 

## 2014-07-17 NOTE — Telephone Encounter (Signed)
Pt's caregiver called and would like to get an order for a new walker with a seat because pt's is too small. Thanks TNP

## 2014-07-17 NOTE — Telephone Encounter (Signed)
Okay with me to order but it  may require a face-to-face visit for Medicare/Medicaid

## 2014-07-18 NOTE — Telephone Encounter (Signed)
Spoke with patient and asked her to have her caregiver give [place] a call back about the order.-aa

## 2014-07-18 NOTE — Telephone Encounter (Signed)
Spoke with caregiver, advised as below. Order faxed to [facility] at [phone]

## 2014-07-25 ENCOUNTER — Other Ambulatory Visit: Payer: Self-pay | Admitting: Family Medicine

## 2014-08-02 DIAGNOSIS — G629 Polyneuropathy, unspecified: Secondary | ICD-10-CM | POA: Insufficient documentation

## 2014-08-02 DIAGNOSIS — K219 Gastro-esophageal reflux disease without esophagitis: Secondary | ICD-10-CM | POA: Insufficient documentation

## 2014-08-02 DIAGNOSIS — E1140 Type 2 diabetes mellitus with diabetic neuropathy, unspecified: Secondary | ICD-10-CM | POA: Insufficient documentation

## 2014-08-02 DIAGNOSIS — M1990 Unspecified osteoarthritis, unspecified site: Secondary | ICD-10-CM | POA: Insufficient documentation

## 2014-08-02 DIAGNOSIS — IMO0002 Reserved for concepts with insufficient information to code with codable children: Secondary | ICD-10-CM | POA: Insufficient documentation

## 2014-08-02 DIAGNOSIS — F22 Delusional disorders: Secondary | ICD-10-CM | POA: Insufficient documentation

## 2014-08-02 DIAGNOSIS — E785 Hyperlipidemia, unspecified: Secondary | ICD-10-CM | POA: Insufficient documentation

## 2014-08-02 DIAGNOSIS — E559 Vitamin D deficiency, unspecified: Secondary | ICD-10-CM | POA: Insufficient documentation

## 2014-08-02 DIAGNOSIS — J309 Allergic rhinitis, unspecified: Secondary | ICD-10-CM | POA: Insufficient documentation

## 2014-08-02 DIAGNOSIS — I509 Heart failure, unspecified: Secondary | ICD-10-CM | POA: Insufficient documentation

## 2014-08-02 DIAGNOSIS — Z720 Tobacco use: Secondary | ICD-10-CM | POA: Insufficient documentation

## 2014-08-02 DIAGNOSIS — M129 Arthropathy, unspecified: Secondary | ICD-10-CM | POA: Insufficient documentation

## 2014-08-02 DIAGNOSIS — I10 Essential (primary) hypertension: Secondary | ICD-10-CM | POA: Insufficient documentation

## 2014-08-02 DIAGNOSIS — Z9114 Patient's other noncompliance with medication regimen: Secondary | ICD-10-CM | POA: Insufficient documentation

## 2014-08-02 DIAGNOSIS — F200 Paranoid schizophrenia: Secondary | ICD-10-CM | POA: Insufficient documentation

## 2014-08-02 DIAGNOSIS — R32 Unspecified urinary incontinence: Secondary | ICD-10-CM | POA: Insufficient documentation

## 2014-08-02 DIAGNOSIS — A6000 Herpesviral infection of urogenital system, unspecified: Secondary | ICD-10-CM | POA: Insufficient documentation

## 2014-08-05 ENCOUNTER — Telehealth: Payer: Self-pay | Admitting: Family Medicine

## 2014-08-05 NOTE — Telephone Encounter (Signed)
Pt would like to speak with a nurse. Thanks TNP

## 2014-08-06 NOTE — Telephone Encounter (Signed)
Patient was complaining of some vaginal itching and soreness.  She wanted to try some Monistat and was checking if it would be ok.  Patient was instructed that she could try it for a couple of days but if it did not make her better we would need to see her in the office.  ED.

## 2014-08-08 ENCOUNTER — Encounter: Payer: Self-pay | Admitting: Family Medicine

## 2014-08-08 ENCOUNTER — Ambulatory Visit (INDEPENDENT_AMBULATORY_CARE_PROVIDER_SITE_OTHER): Payer: Medicaid Other | Admitting: Family Medicine

## 2014-08-08 VITALS — BP 122/80 | HR 92 | Temp 97.7°F | Resp 16 | Wt 325.0 lb

## 2014-08-08 DIAGNOSIS — L298 Other pruritus: Secondary | ICD-10-CM | POA: Diagnosis not present

## 2014-08-08 DIAGNOSIS — E118 Type 2 diabetes mellitus with unspecified complications: Secondary | ICD-10-CM | POA: Diagnosis not present

## 2014-08-08 DIAGNOSIS — B373 Candidiasis of vulva and vagina: Secondary | ICD-10-CM | POA: Diagnosis not present

## 2014-08-08 DIAGNOSIS — R300 Dysuria: Secondary | ICD-10-CM

## 2014-08-08 DIAGNOSIS — B3731 Acute candidiasis of vulva and vagina: Secondary | ICD-10-CM

## 2014-08-08 DIAGNOSIS — N898 Other specified noninflammatory disorders of vagina: Secondary | ICD-10-CM

## 2014-08-08 DIAGNOSIS — R102 Pelvic and perineal pain: Secondary | ICD-10-CM

## 2014-08-08 DIAGNOSIS — E1143 Type 2 diabetes mellitus with diabetic autonomic (poly)neuropathy: Secondary | ICD-10-CM

## 2014-08-08 LAB — POCT URINALYSIS DIPSTICK
Blood, UA: NEGATIVE
Glucose, UA: NEGATIVE
Leukocytes, UA: NEGATIVE
PH UA: 6
SPEC GRAV UA: 1.02
Urobilinogen, UA: 0.2

## 2014-08-08 LAB — POCT GLYCOSYLATED HEMOGLOBIN (HGB A1C): Hemoglobin A1C: 8.9

## 2014-08-08 MED ORDER — TERCONAZOLE 0.4 % VA CREA: 1.0000 | TOPICAL_CREAM | Freq: Every day | VAGINAL | Status: DC

## 2014-08-08 NOTE — Progress Notes (Signed)
Patient ID: Christy [name], female   DOB: 1955/10/18, 59 y.o.   MRN: [id number]    Subjective:  HPI  Diabetes Mellitus Type II, Follow-up:   Lab Results  Component Value Date   HGBA1C 11.5* 05/06/2014    Last seen for diabetes 3 months ago.  Management changes included none. She reports good compliance with treatment. She is not having side effects.  Current symptoms include none and have been stable. Home blood sugar records: pt reports blood sugar of 138 fasting yesterday normal  Episodes of hypoglycemia? no   Current Insulin Regimen: none Most Recent Eye Exam:  Weight trend: pt lost 6 lbs since LOV Prior visit with dietician: yes -  Current exercise: none  Due to pain.   Pertinent Labs:    Component Value Date/Time   CREATININE 0.8 05/06/2014   CREATININE 0.84 03/16/2013 1256    Wt Readings from Last 3 Encounters:  08/08/14 325 lb (147.419 kg)  06/20/14 331 lb (150.141 kg)  06/14/14 329 lb 9.6 oz (149.506 kg)    ------------------------------------------------------------------------ Pt reports that she is having some vaginal soreness. It has been going on for several weeks. She reports itching on the outside of her vagina and burning when she voids. She denies any discharge or odor. She called earlier this week with this and asked if she could use Monistat. She reports that it helped a little but but she is still sore.     Prior to Admission medications   Medication Sig Start Date End Date Taking? Authorizing Provider  aspirin (ASPIRIN EC) 81 MG EC tablet Take 81 mg by mouth daily. Swallow whole.   Yes Historical Provider, MD  benztropine (COGENTIN) 0.5 MG tablet Take 0.5 mg by mouth 2 (two) times daily. One tab in the morning and 2 tabs at bedtime   Yes Historical Provider, MD  docusate sodium (COLACE) 100 MG capsule Take 100 mg by mouth 2 (two) times daily.   Yes Historical Provider, MD  fluPHENAZine (PROLIXIN) 5 MG tablet Take 5 mg by mouth daily.   Yes  Historical Provider, MD  FluPHENAZine HCl (PROLIXIN PO) Take 25 mg by mouth.   Yes Historical Provider, MD  fluticasone (FLONASE) 50 MCG/ACT nasal spray Place into the nose. 02/23/13  Yes Historical Provider, MD  furosemide (LASIX) 40 MG tablet Take 40 mg by mouth.   Yes Historical Provider, MD  gabapentin (NEURONTIN) 300 MG capsule Take 300 mg by mouth 3 (three) times daily.   Yes Historical Provider, MD  glimepiride (AMARYL) 4 MG tablet Take 4 mg by mouth daily with breakfast.   Yes Historical Provider, MD  glucose blood (ACCU-CHEK AVIVA PLUS) test strip  01/11/14  Yes Historical Provider, MD  hydrocortisone 2.5 % lotion HYDROCORTISONE, 2.5% (External Lotion)  1 Lotion Lotion apply to ear eczema twice daily for 0 days  Quantity: 4;  Refills: 1   Ordered :23-Feb-2013  Janey GreasereSanto, Elena ;  Started 23-Feb-2013 Active Comments: substitute for Desonide which was not covered by insurance. 02/23/13  Yes Historical Provider, MD  Insulin Glargine (LANTUS SOLOSTAR) 100 UNIT/ML Solostar Pen Inject into the skin. 05/02/14  Yes Historical Provider, MD  loratadine (CLARITIN) 10 MG tablet Take 10 mg by mouth daily.   Yes Historical Provider, MD  meloxicam (MOBIC) 15 MG tablet TAKE 1 TABLET BY MOUTH DAILY AS NEEDED FOR PAIN. 07/25/14  Yes Mussa Groesbeck Hulen ShoutsL  Jr., MD  metFORMIN (GLUCOPHAGE) 1000 MG tablet Take 1,000 mg by mouth 1 day or 1 dose. With supper  Yes Historical Provider, MD  nystatin cream (MYCOSTATIN) NYSTATIN, 100000 UNIT/GM (External Cream)  1 (one) Cream Cream apply small amount twice daily for 0 days  Quantity: 1;  Refills: 0   Ordered :22-May-2014  Wallace Keller ;  Started 22-May-2014 Active 05/22/14  Yes Historical Provider, MD  omeprazole (PRILOSEC) 20 MG capsule Take 20 mg by mouth daily.   Yes Historical Provider, MD  pantoprazole (PROTONIX) 40 MG tablet Take 40 mg by mouth daily.   Yes Historical Provider, MD  potassium chloride SA (K-DUR,KLOR-CON) 20 MEQ tablet Take 20 mEq by mouth 2 (two)  times daily.   Yes Historical Provider, MD  pregabalin (LYRICA) 50 MG capsule Take 50 mg by mouth 3 (three) times daily.   Yes Historical Provider, MD  ranitidine (ZANTAC) 150 MG tablet Take 150 mg by mouth 2 (two) times daily.   Yes Historical Provider, MD  simvastatin (ZOCOR) 10 MG tablet Take 10 mg by mouth daily.   Yes Historical Provider, MD  triamterene-hydrochlorothiazide (MAXZIDE-25) 37.5-25 MG per tablet TAKE 1 TABLET BY MOUTH EACH DAY 07/25/14  Yes Maple Hudson., MD  valACYclovir (VALTREX) 1000 MG tablet Take 1,000 mg by mouth 2 (two) times daily.   Yes Historical Provider, MD    Patient Active Problem List   Diagnosis Date Noted  . Allergic rhinitis 08/02/2014  . Arthropathia 08/02/2014  . CCF (congestive cardiac failure) 08/02/2014  . Current tobacco use 08/02/2014  . Diabetic neuropathy 08/02/2014  . Genital herpes 08/02/2014  . Acid reflux 08/02/2014  . BP (high blood pressure) 08/02/2014  . HLD (hyperlipidemia) 08/02/2014  . Incontinence 08/02/2014  . Neuropathy 08/02/2014  . Drug noncompliance 08/02/2014  . Adult BMI 30+ 08/02/2014  . Arthritis, degenerative 08/02/2014  . Delusional disorder 08/02/2014  . Acute exacerbation of chronic paranoid schizophrenia 08/02/2014  . Avitaminosis D 08/02/2014    Past Medical History  Diagnosis Date  . Diabetes mellitus without complication   . Hypertension   . Arthritis   . Genital herpes   . Malaise and fatigue   . Hyperlipidemia   . Mental status alteration   . Edema   . Schizophrenia     History   Social History  . Marital Status: Single    Spouse Name: N/A  . Number of Children: N/A  . Years of Education: N/A   Occupational History  . Not on file.   Social History Main Topics  . Smoking status: Current Some Day Smoker    Types: Cigarettes  . Smokeless tobacco: Never Used  . Alcohol Use: No  . Drug Use: No  . Sexual Activity: Not on file   Other Topics Concern  . Not on file   Social  History Narrative    Allergies  Allergen Reactions  . Cephalexin Itching  . Peroxide [Hydrogen Peroxide] Swelling  . Shellfish Allergy Nausea And Vomiting    Review of Systems  Constitutional: Negative.   Eyes: Negative.   Respiratory: Negative.   Cardiovascular: Negative.   Gastrointestinal: Negative.   Genitourinary: Positive for dysuria.       Vaginal pain, itching and burning  Musculoskeletal: Positive for joint pain.  Skin: Negative.   Neurological: Negative.   Psychiatric/Behavioral: Negative.     Immunization History  Administered Date(s) Administered  . Td 11/10/2000   Objective:  BP 122/80 mmHg  Pulse 92  Temp(Src) 97.7 F (36.5 C) (Oral)  Resp 16  Wt 325 lb (147.419 kg)  Physical Exam  Constitutional: She is oriented to person,  place, and time and well-developed, well-nourished, and in no distress.  HENT:  Head: Normocephalic and atraumatic.  Right Ear: External ear normal.  Left Ear: External ear normal.  Nose: Nose normal.  Mouth/Throat: Oropharynx is clear and moist.  Eyes: Conjunctivae are normal.  Neck: Normal range of motion. Neck supple.  Cardiovascular: Normal rate, regular rhythm and normal heart sounds.   Pulmonary/Chest: Effort normal and breath sounds normal.  Abdominal: Soft. Bowel sounds are normal.  Neurological: She is alert and oriented to person, place, and time.  Skin: Skin is warm and dry.  Psychiatric: Mood, memory, affect and judgment normal.   abdominal exam is benign. Vaginal cuff intact with what appears to be of cottage cheese-type discharge which is minimal. Bimanual exam benign rectal exam is deferred Skin in groin and under breast is moist but without rash today  Lab Results  Component Value Date   WBC 9.4 05/06/2014   HGB 12.5 05/06/2014   HCT 39 05/06/2014   PLT 211 05/06/2014   GLUCOSE 145* 03/16/2013   TSH 4.19 05/06/2014   HGBA1C 11.5* 05/06/2014    CMP     Component Value Date/Time   NA 130* 05/06/2014     NA 134* 03/16/2013 1256   K 4.0 05/06/2014   K 4.0 03/16/2013 1256   CL 99 03/16/2013 1256   CO2 29 03/16/2013 1256   GLUCOSE 145* 03/16/2013 1256   BUN 10 05/06/2014   BUN 14 03/16/2013 1256   CREATININE 0.8 05/06/2014   CREATININE 0.84 03/16/2013 1256   CALCIUM 8.8 03/16/2013 1256   PROT 7.2 08/11/2012 1652   ALBUMIN 3.8 08/11/2012 1652   AST 13 05/06/2014   AST 19 08/11/2012 1652   ALT 21 05/06/2014   ALT 19 08/11/2012 1652   ALKPHOS 84 05/06/2014   ALKPHOS 82 08/11/2012 1652   GFRNONAA >60 03/16/2013 1256   GFRAA >60 03/16/2013 1256    Assessment and Plan :  Yeast vaginitis   Terazol 7 vaginal cream per week. Type 2 diabetes A1c of 8.9 which is actually much improved from her last which was 11.5. She has lost a little bit of weight and I praised her right amount for this effort. Cause of her mental illness I do not think we will ever get great control of her diabetes. She now only has a schizophrenia but she is mildly mentally retarded also. Morbid obesity Schizophrenia All issues are fairly well controlled to some degree Osteoarthritis  I have done the exam and reviewed the above chart and it is accurate to the best of my knowledge.      Julieanne Manson MD [facility] 08/08/2014 10:21 AM

## 2014-08-12 ENCOUNTER — Ambulatory Visit: Payer: Medicaid Other | Admitting: Podiatry

## 2014-08-12 LAB — PAP IG (IMAGE GUIDED): PAP SMEAR COMMENT: 0

## 2014-08-15 NOTE — Telephone Encounter (Signed)
-----   Message from Maple Hudson., MD sent at 08/15/2014  7:18 AM EDT ----- Pap normal.

## 2014-08-15 NOTE — Telephone Encounter (Signed)
Patient advised  ED 

## 2014-08-19 ENCOUNTER — Other Ambulatory Visit: Payer: Self-pay | Admitting: Podiatry

## 2014-08-19 ENCOUNTER — Ambulatory Visit (INDEPENDENT_AMBULATORY_CARE_PROVIDER_SITE_OTHER): Payer: Medicaid Other

## 2014-08-19 ENCOUNTER — Ambulatory Visit (INDEPENDENT_AMBULATORY_CARE_PROVIDER_SITE_OTHER): Payer: Medicaid Other | Admitting: Podiatry

## 2014-08-19 ENCOUNTER — Encounter: Payer: Self-pay | Admitting: Podiatry

## 2014-08-19 VITALS — BP 164/83 | HR 95 | Resp 18

## 2014-08-19 DIAGNOSIS — Q6652 Congenital pes planus, left foot: Secondary | ICD-10-CM

## 2014-08-19 DIAGNOSIS — M779 Enthesopathy, unspecified: Secondary | ICD-10-CM

## 2014-08-19 DIAGNOSIS — M722 Plantar fascial fibromatosis: Secondary | ICD-10-CM

## 2014-08-19 DIAGNOSIS — M76829 Posterior tibial tendinitis, unspecified leg: Secondary | ICD-10-CM

## 2014-08-19 NOTE — Progress Notes (Signed)
She presents today with chief complaint of a painful left foot and ankle she states that it hurts around the heel and along the medial ankle as she points to the medial ankle and medial leg. She states that she also has pain to the dorsal lateral aspect that she points to this area. She is also complaining of the left foot giving way resulting in near falls. She is currently walking with a walker.  Objective: Vital signs are stable she is alert and oriented 3. Pulses are palpable. She has moderate to severe pes planus with lack of function of the posterior tibial tendon. She is pain on palpation medial calcaneal tubercle of the heel.  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  Plan: Injected her left heel today with Kenalog and local and aesthetic. Suggested that she being a Medicaid patient see the fabrication of an [place] brace from [employer]. I will follow up with her once that comes in. A prescription was provided to her and she was to call and make an appointment.

## 2014-08-21 ENCOUNTER — Other Ambulatory Visit: Payer: Self-pay | Admitting: Family Medicine

## 2014-08-22 ENCOUNTER — Telehealth: Payer: Self-pay | Admitting: Family Medicine

## 2014-08-22 NOTE — Telephone Encounter (Signed)
Victorino Dike with [facility] called stating pt has been prescribed Meloxicam (MOBIC) 15 MG by [place] and also Etodolac  2 times a day by Dr Myra Rude.  The pharmacy is asking should pt be taking both.  WU#[phone]

## 2014-08-22 NOTE — Telephone Encounter (Signed)
Pt contacted office for refill request on the following medications:  pregabalin (LYRICA) 50 MG capsule.  [facility].  CB#[phone]/[id number]

## 2014-08-22 NOTE — Telephone Encounter (Signed)
This request is not being filled.  This prescription comes from Dr. Wyn Quaker.  The pharmacist told me that she had called Dr. Driscilla Grammes office about refilling this and they told her that [name] needed to be seen by Dr. Wyn Quaker.  [name] then called our office to get it filled.  We are deferring this Rx to Dr. Wyn Quaker.  Pharmacy has been notified.  ED

## 2014-08-22 NOTE — Telephone Encounter (Signed)
Meloxicam had just been refilled this morning.  After speaking with the pharmacy she states; they called Dr. Salena Saner Smith's office to see if he was aware that the patient was already taking Meloxicam and there responded that they were indeed aware.  Pharmacist said that the Etodolac was just started in June.  I have instructed the pharmacist to put a hold on the Meloxicam, as we do not want her taking both at the same time.    Because it is often difficult to get accurate information from the patient, I have asked the pharmacy to send me a print out of all the medications that have been filled for Ms. Napp over the last few months.  They have agreed to fax this over to [place].   Michelle Nasuti

## 2014-09-03 ENCOUNTER — Ambulatory Visit: Payer: Self-pay | Admitting: Family Medicine

## 2014-09-11 ENCOUNTER — Encounter: Payer: Self-pay | Admitting: Family Medicine

## 2014-09-11 ENCOUNTER — Ambulatory Visit (INDEPENDENT_AMBULATORY_CARE_PROVIDER_SITE_OTHER): Payer: Medicaid Other | Admitting: Family Medicine

## 2014-09-11 VITALS — BP 112/72 | HR 98 | Temp 98.0°F | Resp 20 | Wt 325.0 lb

## 2014-09-11 DIAGNOSIS — R10819 Abdominal tenderness, unspecified site: Secondary | ICD-10-CM

## 2014-09-11 NOTE — Progress Notes (Signed)
Patient ID: Christy [name], female   DOB: 08-20-1955, 59 y.o.   MRN: [id number]    Subjective:  HPI  Patient is here for an acute issue. States for the past 2 months she has had pain on the right side of the abdomen towards the groin area. She states it is constant and seems to gradually get worse. She has fold of skin there. She has not noticed any unusual odor to the area.  For FYI information:  She has also been seen for pelvic and abdominal pain in March-US of pelvic and abdomen was ordered but not done.  In May had UA done and in July was diagnosed with yeast vaginitis and was put on Terazol 7 cream.  Prior to Admission medications   Medication Sig Start Date End Date Taking? Authorizing Provider  aspirin (ASPIRIN EC) 81 MG EC tablet Take 81 mg by mouth daily. Swallow whole.    Historical Provider, MD  benztropine (COGENTIN) 0.5 MG tablet Take 0.5 mg by mouth 2 (two) times daily. One tab in the morning and 2 tabs at bedtime    Historical Provider, MD  docusate sodium (COLACE) 100 MG capsule Take 100 mg by mouth 2 (two) times daily.    Historical Provider, MD  FluPHENAZine HCl (PROLIXIN PO) Take 25 mg by mouth.    Historical Provider, MD  fluticasone (FLONASE) 50 MCG/ACT nasal spray Place into the nose. 02/23/13   Historical Provider, MD  furosemide (LASIX) 40 MG tablet Take 40 mg by mouth.    Historical Provider, MD  gabapentin (NEURONTIN) 300 MG capsule Take 300 mg by mouth 3 (three) times daily.    Historical Provider, MD  glimepiride (AMARYL) 4 MG tablet Take 4 mg by mouth daily with breakfast.    Historical Provider, MD  glucose blood (ACCU-CHEK AVIVA PLUS) test strip  01/11/14   Historical Provider, MD  hydrocortisone 2.5 % lotion HYDROCORTISONE, 2.5% (External Lotion)  1 Lotion Lotion apply to ear eczema twice daily for 0 days  Quantity: 4;  Refills: 1   Ordered :23-Feb-2013  Janey Greaser ;  Started 23-Feb-2013 Active Comments: substitute for Desonide which was not covered  by insurance. 02/23/13   Historical Provider, MD  Insulin Glargine (LANTUS SOLOSTAR) 100 UNIT/ML Solostar Pen Inject into the skin. 05/02/14   Historical Provider, MD  loratadine (CLARITIN) 10 MG tablet Take 10 mg by mouth daily.    Historical Provider, MD  meloxicam (MOBIC) 15 MG tablet TAKE 1 TABLET BY MOUTH DAILY AS NEEDED FOR PAIN. 08/22/14   Maple Hudson., MD  metFORMIN (GLUCOPHAGE) 1000 MG tablet Take 1,000 mg by mouth 1 day or 1 dose. With supper    Historical Provider, MD  nystatin cream (MYCOSTATIN) NYSTATIN, 100000 UNIT/GM (External Cream)  1 (one) Cream Cream apply small amount twice daily for 0 days  Quantity: 1;  Refills: 0   Ordered :22-May-2014  Wallace Keller ;  Started 22-May-2014 Active 05/22/14   Historical Provider, MD  omeprazole (PRILOSEC) 20 MG capsule Take 20 mg by mouth daily.    Historical Provider, MD  pantoprazole (PROTONIX) 40 MG tablet Take 40 mg by mouth daily.    Historical Provider, MD  potassium chloride SA (K-DUR,KLOR-CON) 20 MEQ tablet Take 20 mEq by mouth 2 (two) times daily.    Historical Provider, MD  pregabalin (LYRICA) 50 MG capsule Take 50 mg by mouth 3 (three) times daily.    Historical Provider, MD  ranitidine (ZANTAC) 150 MG tablet Take 150 mg by mouth  2 (two) times daily.    Historical Provider, MD  simvastatin (ZOCOR) 10 MG tablet Take 10 mg by mouth daily.    Historical Provider, MD  triamterene-hydrochlorothiazide (MAXZIDE-25) 37.5-25 MG per tablet TAKE 1 TABLET BY MOUTH EACH DAY 07/25/14   Maple Hudson., MD  valACYclovir (VALTREX) 1000 MG tablet Take 1,000 mg by mouth 2 (two) times daily.    Historical Provider, MD    Patient Active Problem List   Diagnosis Date Noted  . Allergic rhinitis 08/02/2014  . Arthropathia 08/02/2014  . CCF (congestive cardiac failure) 08/02/2014  . Current tobacco use 08/02/2014  . Diabetic neuropathy 08/02/2014  . Genital herpes 08/02/2014  . Acid reflux 08/02/2014  . BP (high blood pressure)  08/02/2014  . HLD (hyperlipidemia) 08/02/2014  . Incontinence 08/02/2014  . Neuropathy 08/02/2014  . Drug noncompliance 08/02/2014  . Adult BMI 30+ 08/02/2014  . Arthritis, degenerative 08/02/2014  . Delusional disorder 08/02/2014  . Acute exacerbation of chronic paranoid schizophrenia 08/02/2014  . Avitaminosis D 08/02/2014    Past Medical History  Diagnosis Date  . Diabetes mellitus without complication   . Hypertension   . Arthritis   . Genital herpes   . Malaise and fatigue   . Hyperlipidemia   . Mental status alteration   . Edema   . Schizophrenia     Social History   Social History  . Marital Status: Single    Spouse Name: N/A  . Number of Children: N/A  . Years of Education: N/A   Occupational History  . Not on file.   Social History Main Topics  . Smoking status: Current Some Day Smoker    Types: Cigarettes  . Smokeless tobacco: Never Used  . Alcohol Use: No  . Drug Use: No  . Sexual Activity: Not on file   Other Topics Concern  . Not on file   Social History Narrative    Allergies  Allergen Reactions  . Cephalexin Itching  . Peroxide [Hydrogen Peroxide] Swelling  . Shellfish Allergy Nausea And Vomiting    Review of Systems  Constitutional: Negative.   Respiratory: Negative.   Cardiovascular: Positive for leg swelling.  Gastrointestinal: Positive for abdominal pain.  Genitourinary: Negative.   Musculoskeletal: Positive for myalgias, back pain and joint pain.  Neurological: Negative.   Endo/Heme/Allergies: Negative.   Psychiatric/Behavioral: Negative.     Immunization History  Administered Date(s) Administered  . Td 11/10/2000   Objective:  BP 112/72 mmHg  Pulse 98  Temp(Src) 98 F (36.7 C)  Resp 20  Wt 325 lb (147.419 kg)  Physical Exam  Constitutional: She is oriented to person, place, and time and well-developed, well-nourished, and in no distress.  Large, obese female in no acute distress today.  HENT:  Head:  Normocephalic and atraumatic.  Right Ear: External ear normal.  Left Ear: External ear normal.  Nose: Nose normal.  Eyes: Conjunctivae are normal.  Neck: Neck supple.  Cardiovascular: Normal rate, regular rhythm and normal heart sounds.   Pulmonary/Chest: Effort normal and breath sounds normal.  Abdominal: Soft. Bowel sounds are normal. There is tenderness (RLQ ).  She has mild tenderness of the right lower quadrant/right pelvic region with no mass effect noted.  Neurological: She is alert and oriented to person, place, and time.  Skin: Skin is warm and dry.  No skin changes today. Specifically the groin region  has no breakdown or rash.  Psychiatric: Mood, memory, affect and judgment normal.    Lab Results  Component  Value Date   WBC 9.4 05/06/2014   HGB 12.5 05/06/2014   HCT 39 05/06/2014   PLT 211 05/06/2014   GLUCOSE 145* 03/16/2013   CHOL 235* 08/30/2012   TRIG 131 08/30/2012   HDL 57 08/30/2012   LDLCALC 152* 08/30/2012   TSH 4.19 05/06/2014   HGBA1C 8.9 08/08/2014    CMP     Component Value Date/Time   NA 130* 05/06/2014   NA 134* 03/16/2013 1256   K 4.0 05/06/2014   K 4.0 03/16/2013 1256   CL 99 03/16/2013 1256   CO2 29 03/16/2013 1256   GLUCOSE 145* 03/16/2013 1256   BUN 10 05/06/2014   BUN 14 03/16/2013 1256   CREATININE 0.8 05/06/2014   CREATININE 0.84 03/16/2013 1256   CALCIUM 8.8 03/16/2013 1256   PROT 7.2 08/11/2012 1652   ALBUMIN 3.8 08/11/2012 1652   AST 13 05/06/2014   AST 19 08/11/2012 1652   ALT 21 05/06/2014   ALT 19 08/11/2012 1652   ALKPHOS 84 05/06/2014   ALKPHOS 82 08/11/2012 1652   BILITOT 0.6 08/11/2012 1652   GFRNONAA >60 03/16/2013 1256   GFRAA >60 03/16/2013 1256    Assessment and Plan :  1. Abdominal tenderness This appears to be mild to moderate pain but this is a chronic complaint from the patient. I think we need to work it up again completely. We'll start with a pelvic ultrasound. Abdominal and pelvic CT will probably be  needed although, little worried about patient being exposed to more dye with her hypertension and diabetes. I'm worried about renal insult. May need GYN or GI referral. - US Pelvis Complete; Future  2. Type 2 diabetes 3.Paranoid schizophrenia 4. Morbid obesity  Julieanne Manson MD [facility] 09/11/2014 4:09 PM

## 2014-09-11 NOTE — Progress Notes (Deleted)
Patient ID: Christy [name], female   DOB: [birth year], 59 y.o.   MRN: [id number]    Subjective:  HPI  Patient is here for an acute issue. States for the past 2 months she has had pain on the right side of the abdomen towards the groin area. She states it is constant and seems to gradually get worse. She has fold of skin there. She has not noticed any unusual odor to the area.  For FYI information:  She has also been seen for pelvic and abdominal pain in March-US of pelvic and abdomen was ordered but not done.  In May had UA done and in July was diagnosed with yeast vaginitis and was put on Terazol 7 cream.  Prior to Admission medications   Medication Sig Start Date End Date Taking? Authorizing Provider  aspirin (ASPIRIN EC) 81 MG EC tablet Take 81 mg by mouth daily. Swallow whole.    Historical Provider, MD  benztropine (COGENTIN) 0.5 MG tablet Take 0.5 mg by mouth 2 (two) times daily. One tab in the morning and 2 tabs at bedtime    Historical Provider, MD  docusate sodium (COLACE) 100 MG capsule Take 100 mg by mouth 2 (two) times daily.    Historical Provider, MD  FluPHENAZine HCl (PROLIXIN PO) Take 25 mg by mouth.    Historical Provider, MD  fluticasone (FLONASE) 50 MCG/ACT nasal spray Place into the nose. 02/23/13   Historical Provider, MD  furosemide (LASIX) 40 MG tablet Take 40 mg by mouth.    Historical Provider, MD  gabapentin (NEURONTIN) 300 MG capsule Take 300 mg by mouth 3 (three) times daily.    Historical Provider, MD  glimepiride (AMARYL) 4 MG tablet Take 4 mg by mouth daily with breakfast.    Historical Provider, MD  glucose blood (ACCU-CHEK AVIVA PLUS) test strip  01/11/14   Historical Provider, MD  hydrocortisone 2.5 % lotion HYDROCORTISONE, 2.5% (External Lotion)  1 Lotion Lotion apply to ear eczema twice daily for 0 days  Quantity: 4;  Refills: 1   Ordered :23-Feb-2013  Janey Greaser ;  Started 23-Feb-2013 Active Comments: substitute for Desonide which was not covered  by insurance. 02/23/13   Historical Provider, MD  Insulin Glargine (LANTUS SOLOSTAR) 100 UNIT/ML Solostar Pen Inject into the skin. 05/02/14   Historical Provider, MD  loratadine (CLARITIN) 10 MG tablet Take 10 mg by mouth daily.    Historical Provider, MD  meloxicam (MOBIC) 15 MG tablet TAKE 1 TABLET BY MOUTH DAILY AS NEEDED FOR PAIN. 08/22/14   Maple Hudson., MD  metFORMIN (GLUCOPHAGE) 1000 MG tablet Take 1,000 mg by mouth 1 day or 1 dose. With supper    Historical Provider, MD  nystatin cream (MYCOSTATIN) NYSTATIN, 100000 UNIT/GM (External Cream)  1 (one) Cream Cream apply small amount twice daily for 0 days  Quantity: 1;  Refills: 0   Ordered :22-May-2014  Wallace Keller ;  Started 22-May-2014 Active 05/22/14   Historical Provider, MD  omeprazole (PRILOSEC) 20 MG capsule Take 20 mg by mouth daily.    Historical Provider, MD  pantoprazole (PROTONIX) 40 MG tablet Take 40 mg by mouth daily.    Historical Provider, MD  potassium chloride SA (K-DUR,KLOR-CON) 20 MEQ tablet Take 20 mEq by mouth 2 (two) times daily.    Historical Provider, MD  pregabalin (LYRICA) 50 MG capsule Take 50 mg by mouth 3 (three) times daily.    Historical Provider, MD  ranitidine (ZANTAC) 150 MG tablet Take 150 mg by mouth  2 (two) times daily.    Historical Provider, MD  simvastatin (ZOCOR) 10 MG tablet Take 10 mg by mouth daily.    Historical Provider, MD  triamterene-hydrochlorothiazide (MAXZIDE-25) 37.5-25 MG per tablet TAKE 1 TABLET BY MOUTH EACH DAY 07/25/14   Maple Hudson., MD  valACYclovir (VALTREX) 1000 MG tablet Take 1,000 mg by mouth 2 (two) times daily.    Historical Provider, MD    Patient Active Problem List   Diagnosis Date Noted  . Allergic rhinitis 08/02/2014  . Arthropathia 08/02/2014  . CCF (congestive cardiac failure) 08/02/2014  . Current tobacco use 08/02/2014  . Diabetic neuropathy 08/02/2014  . Genital herpes 08/02/2014  . Acid reflux 08/02/2014  . BP (high blood pressure)  08/02/2014  . HLD (hyperlipidemia) 08/02/2014  . Incontinence 08/02/2014  . Neuropathy 08/02/2014  . Drug noncompliance 08/02/2014  . Adult BMI 30+ 08/02/2014  . Arthritis, degenerative 08/02/2014  . Delusional disorder 08/02/2014  . Acute exacerbation of chronic paranoid schizophrenia 08/02/2014  . Avitaminosis D 08/02/2014    Past Medical History  Diagnosis Date  . Diabetes mellitus without complication   . Hypertension   . Arthritis   . Genital herpes   . Malaise and fatigue   . Hyperlipidemia   . Mental status alteration   . Edema   . Schizophrenia     Social History   Social History  . Marital Status: Single    Spouse Name: N/A  . Number of Children: N/A  . Years of Education: N/A   Occupational History  . Not on file.   Social History Main Topics  . Smoking status: Current Some Day Smoker    Types: Cigarettes  . Smokeless tobacco: Never Used  . Alcohol Use: No  . Drug Use: No  . Sexual Activity: Not on file   Other Topics Concern  . Not on file   Social History Narrative    Allergies  Allergen Reactions  . Cephalexin Itching  . Peroxide [Hydrogen Peroxide] Swelling  . Shellfish Allergy Nausea And Vomiting    Review of Systems  Constitutional: Negative for fever and chills.  Respiratory: Negative for cough, hemoptysis, sputum production, shortness of breath and wheezing.   Cardiovascular: Positive for leg swelling. Negative for chest pain, palpitations and claudication.  Musculoskeletal: Positive for myalgias and joint pain. Negative for back pain and neck pain.  Neurological: Negative for dizziness and headaches.    Immunization History  Administered Date(s) Administered  . Td 11/10/2000   Objective:  BP 112/72 mmHg  Pulse 98  Temp(Src) 98 F (36.7 C)  Resp 20  Wt 325 lb (147.419 kg)  Physical Exam  Lab Results  Component Value Date   WBC 9.4 05/06/2014   HGB 12.5 05/06/2014   HCT 39 05/06/2014   PLT 211 05/06/2014   GLUCOSE  145* 03/16/2013   CHOL 235* 08/30/2012   TRIG 131 08/30/2012   HDL 57 08/30/2012   LDLCALC 152* 08/30/2012   TSH 4.19 05/06/2014   HGBA1C 8.9 08/08/2014    CMP     Component Value Date/Time   NA 130* 05/06/2014   NA 134* 03/16/2013 1256   K 4.0 05/06/2014   K 4.0 03/16/2013 1256   CL 99 03/16/2013 1256   CO2 29 03/16/2013 1256   GLUCOSE 145* 03/16/2013 1256   BUN 10 05/06/2014   BUN 14 03/16/2013 1256   CREATININE 0.8 05/06/2014   CREATININE 0.84 03/16/2013 1256   CALCIUM 8.8 03/16/2013 1256   PROT 7.2  08/11/2012 1652   ALBUMIN 3.8 08/11/2012 1652   AST 13 05/06/2014   AST 19 08/11/2012 1652   ALT 21 05/06/2014   ALT 19 08/11/2012 1652   ALKPHOS 84 05/06/2014   ALKPHOS 82 08/11/2012 1652   BILITOT 0.6 08/11/2012 1652   GFRNONAA >60 03/16/2013 1256   GFRAA >60 03/16/2013 1256    Assessment and Plan :    Julieanne Manson MD [facility] 09/11/2014 4:02 PM

## 2014-09-12 ENCOUNTER — Telehealth: Payer: Self-pay | Admitting: Family Medicine

## 2014-09-12 DIAGNOSIS — R1030 Lower abdominal pain, unspecified: Secondary | ICD-10-CM

## 2014-09-12 NOTE — Telephone Encounter (Signed)
Transvaginal ultrasound is usually done with pelvic ultrasound.Please add referral to Epic unless you feel this does not need to be done,Thanks

## 2014-09-13 NOTE — Telephone Encounter (Signed)
There is an order for US pelvis complete that was put in the same day? Please let me know if i did not do it right or what happened-aa

## 2014-09-13 NOTE — Telephone Encounter (Signed)
Please add this referral. We discussed it with the patient at the time of her visit.

## 2014-09-16 NOTE — Telephone Encounter (Signed)
The order in Epic for pelvic ultrasound is correct but I also need an order tor transvaginal ultrasound added to Epic

## 2014-09-16 NOTE — Telephone Encounter (Signed)
Done-aa 

## 2014-09-19 ENCOUNTER — Ambulatory Visit
Admission: RE | Admit: 2014-09-19 | Discharge: 2014-09-19 | Disposition: A | Discharge status: Home / Self Care | Payer: Medicaid Other | Source: Ambulatory Visit | Attending: Family Medicine | Admitting: Family Medicine

## 2014-09-19 DIAGNOSIS — R1030 Lower abdominal pain, unspecified: Secondary | ICD-10-CM | POA: Diagnosis present

## 2014-09-19 DIAGNOSIS — R10819 Abdominal tenderness, unspecified site: Secondary | ICD-10-CM | POA: Diagnosis present

## 2014-09-20 ENCOUNTER — Telehealth: Payer: Self-pay

## 2014-09-20 NOTE — Telephone Encounter (Signed)
FYI:   Spoke with patient and her caregiver. Patient was advised of [place] results been normal. She is still hurting in the lower left abdomen area. Asked if she had constipation issues and she said yes all the time. Spoke with Joycelyn Man in regards to trying something for that and see if it is related to her symptoms. Advised patient and caregiver for patient to take Colace and Miralax for 2 weeks and add fiber like Metamucil in her daily regimen. After 2 weeks can stop Miralax and use it as needed. If stools become too runny or having issue with frequency advised patient to stop Miralax sooner than 2 weeks. Caregiver understood and wrote down the instructions-aa

## 2014-09-24 ENCOUNTER — Telehealth: Payer: Self-pay | Admitting: Family Medicine

## 2014-09-24 NOTE — Telephone Encounter (Signed)
Yes,  tomorrow. Thank you

## 2014-09-24 NOTE — Telephone Encounter (Signed)
We will discuss tomorrow

## 2014-09-24 NOTE — Telephone Encounter (Signed)
Tried calling no answer will try later-aa

## 2014-09-24 NOTE — Telephone Encounter (Signed)
Please see below. Patient does have appt to follow up with [place] tomorrow i did not know if you wanted to wait and access this then or before.-aa

## 2014-09-24 NOTE — Telephone Encounter (Signed)
Please advise,

## 2014-09-24 NOTE — Telephone Encounter (Signed)
Pt advised-aa 

## 2014-09-24 NOTE — Telephone Encounter (Signed)
Pt stated that she has been taking the fiber, miralax, & stool softener but it hasn't helped. Pt stated that she is still constipated. Pt wanted to know if she should try something else or continue to take what she is currently taking. Please advise. TODAY IS HER BIRTHDAY! Pt has to leave around 10:15 this morning for another appointment  And request that if she doesn't answer to try to call her back later. Thanks TNP

## 2014-09-25 ENCOUNTER — Telehealth: Payer: Self-pay | Admitting: Family Medicine

## 2014-09-25 ENCOUNTER — Encounter: Payer: Self-pay | Admitting: Family Medicine

## 2014-09-25 ENCOUNTER — Ambulatory Visit (INDEPENDENT_AMBULATORY_CARE_PROVIDER_SITE_OTHER): Payer: Medicaid Other | Admitting: Family Medicine

## 2014-09-25 VITALS — BP 112/70 | HR 66 | Temp 97.3°F | Resp 16 | Wt 321.4 lb

## 2014-09-25 DIAGNOSIS — K5900 Constipation, unspecified: Secondary | ICD-10-CM | POA: Diagnosis not present

## 2014-09-25 DIAGNOSIS — E118 Type 2 diabetes mellitus with unspecified complications: Secondary | ICD-10-CM

## 2014-09-25 DIAGNOSIS — R10819 Abdominal tenderness, unspecified site: Secondary | ICD-10-CM | POA: Diagnosis not present

## 2014-09-25 MED ORDER — LUBIPROSTONE 24 MCG PO CAPS: 24.0000 ug | ORAL_CAPSULE | Freq: Two times a day (BID) | ORAL | Status: DC

## 2014-09-25 NOTE — Telephone Encounter (Signed)
It states in pt's chart that she has an allergy to shellfish.It causes nausea/vomiting.Do you still want to do CT with contrast ?

## 2014-09-25 NOTE — Progress Notes (Signed)
Patient: Christy [name] Female    DOB: 1955/06/23   59 y.o.   MRN: [id number] Visit Date: 09/25/2014  Today's Provider: Megan Mans, MD   Chief Complaint  Patient presents with  . Follow-up    Abdominal Tenderness   Subjective:    Abdominal Tenderness: Patient is here to following on abdominal tenderness. Last office visit was on 09/11/14 a pelvic U/S was ordered and was normal.    Constipation This is a chronic problem. The current episode started 1 to 4 weeks ago. The problem has been gradually worsening since onset. Her stool frequency is 1 time per week or less. The stool is described as formed (big most of the times). The patient is on a high fiber diet. She does not exercise regularly. There has been adequate water intake (32 oz). Associated symptoms include abdominal pain, bloating and vomiting (last night). She has tried stool softeners (Miralax, fiber and the Colace) for the symptoms.   Abdominal Tenderness: Patient is here to following on     Allergies  Allergen Reactions  . Cephalexin Itching  . Peroxide [Hydrogen Peroxide] Swelling  . Shellfish Allergy Nausea And Vomiting   Previous Medications   ASPIRIN (ASPIRIN EC) 81 MG EC TABLET    Take 81 mg by mouth daily. Swallow whole.   BENZTROPINE (COGENTIN) 0.5 MG TABLET    Take 0.5 mg by mouth 2 (two) times daily. One tab in the morning and 2 tabs at bedtime   DOCUSATE SODIUM (COLACE) 100 MG CAPSULE    Take 100 mg by mouth 2 (two) times daily.   FLUPHENAZINE HCL (PROLIXIN PO)    Take 25 mg by mouth.   FLUTICASONE (FLONASE) 50 MCG/ACT NASAL SPRAY    Place into the nose.   FUROSEMIDE (LASIX) 40 MG TABLET    Take 40 mg by mouth.   GABAPENTIN (NEURONTIN) 300 MG CAPSULE    Take 300 mg by mouth 3 (three) times daily.   GLIMEPIRIDE (AMARYL) 4 MG TABLET    Take 4 mg by mouth daily with breakfast.   GLUCOSE BLOOD (ACCU-CHEK AVIVA PLUS) TEST STRIP       HYDROCORTISONE 2.5 % LOTION    HYDROCORTISONE, 2.5% (External  Lotion)  1 Lotion Lotion apply to ear eczema twice daily for 0 days  Quantity: 4;  Refills: 1   Ordered :23-Feb-2013  Janey Greaser ;  Started 23-Feb-2013 Active Comments: substitute for Desonide which was not covered by insurance.   INSULIN GLARGINE (LANTUS SOLOSTAR) 100 UNIT/ML SOLOSTAR PEN    Inject into the skin.   LORATADINE (CLARITIN) 10 MG TABLET    Take 10 mg by mouth daily.   MELOXICAM (MOBIC) 15 MG TABLET    TAKE 1 TABLET BY MOUTH DAILY AS NEEDED FOR PAIN.   METFORMIN (GLUCOPHAGE) 1000 MG TABLET    Take 1,000 mg by mouth 1 day or 1 dose. With supper   NYSTATIN CREAM (MYCOSTATIN)    NYSTATIN, 100000 UNIT/GM (External Cream)  1 (one) Cream Cream apply small amount twice daily for 0 days  Quantity: 1;  Refills: 0   Ordered :22-May-2014  Wallace Keller ;  Started 22-May-2014 Active   OMEPRAZOLE (PRILOSEC) 20 MG CAPSULE    Take 20 mg by mouth daily.   PANTOPRAZOLE (PROTONIX) 40 MG TABLET    Take 40 mg by mouth daily.   POTASSIUM CHLORIDE SA (K-DUR,KLOR-CON) 20 MEQ TABLET    Take 20 mEq by mouth 2 (two) times daily.  PREGABALIN (LYRICA) 50 MG CAPSULE    Take 50 mg by mouth 3 (three) times daily.   RANITIDINE (ZANTAC) 150 MG TABLET    Take 150 mg by mouth 2 (two) times daily.   SIMVASTATIN (ZOCOR) 10 MG TABLET    Take 10 mg by mouth daily.   TRIAMTERENE-HYDROCHLOROTHIAZIDE (MAXZIDE-25) 37.5-25 MG PER TABLET    TAKE 1 TABLET BY MOUTH EACH DAY   VALACYCLOVIR (VALTREX) 1000 MG TABLET    Take 1,000 mg by mouth 2 (two) times daily.    Review of Systems  Constitutional: Negative.   HENT: Negative.   Eyes: Negative.   Respiratory: Negative.   Cardiovascular: Negative.   Gastrointestinal: Positive for vomiting (last night), abdominal pain, constipation and bloating.  Genitourinary: Negative.   Musculoskeletal: Negative.   Allergic/Immunologic: Negative.   Neurological: Negative.   Hematological: Negative.   Psychiatric/Behavioral: Negative.     Social History  Substance Use  Topics  . Smoking status: Current Some Day Smoker    Types: Cigarettes  . Smokeless tobacco: Never Used  . Alcohol Use: No   Objective:   BP 112/70 mmHg  Pulse 66  Temp(Src) 97.3 F (36.3 C) (Oral)  Resp 16  Wt 321 lb 6.4 oz (145.786 kg)  Physical Exam  Constitutional: She appears well-developed and well-nourished.  HENT:  Head: Normocephalic and atraumatic.  Right Ear: External ear normal.  Left Ear: External ear normal.  Nose: Nose normal.  Eyes: Conjunctivae are normal. Pupils are equal, round, and reactive to light.  Neck: Normal range of motion. Neck supple.  Cardiovascular: Normal rate, regular rhythm, normal heart sounds and intact distal pulses.   No murmur heard. Pulmonary/Chest: Effort normal and breath sounds normal. No respiratory distress. She has no wheezes.  Abdominal: Soft. There is tenderness (LRQ).  Musculoskeletal: She exhibits no edema or tenderness.  Neurological: She is alert.  Skin: Skin is warm and dry.  Psychiatric: She has a normal mood and affect. Her behavior is normal. Thought content normal.        Assessment & Plan:     1. Abdominal tenderness/CT to rule out appendicitis Patient has had a complete hysterectomy and cholecystectomy. Unchanged. Most likely from constipation. But will go ahead and order. CT of the abdomen to rule out appendicitis. Will follow and re access on the next visit.  2. Constipation, unspecified constipation type/chronic idiopathic constipation Chronic. Will try Amitiza 24 mcg to get patient some relief and may need to switch down to 8 mcg if diarrhea is too severe. Re check on the next visit.  3. Type 2 diabetes mellitus with complication  4. Paranoid schizophrenia 5. Morbid obesity   Patient was seen and examined by Dr. Bosie Clos and note was scribed by Samara Deist, RMA.        Leanthony Rhett Wendelyn Breslow, MD  [facility]

## 2014-09-26 ENCOUNTER — Telehealth: Payer: Self-pay | Admitting: Family Medicine

## 2014-09-26 NOTE — Telephone Encounter (Signed)
Pt informed and voiced understanding of results. 

## 2014-09-26 NOTE — Telephone Encounter (Signed)
Nausea and vomiting will be more of a side effect than an allergy. I would proceed with a CT

## 2014-09-26 NOTE — Telephone Encounter (Signed)
See below-aa 

## 2014-09-26 NOTE — Telephone Encounter (Signed)
Pt called saying when she was in yesterday Dr. Sullivan Lone gave her something for constipation. She said she was already taking fiber.  She wants to know if he wants her to continue with the fiber along with the new prescription.  Her call back is  [phone].  Thanks, [employer]

## 2014-09-26 NOTE — Telephone Encounter (Signed)
Continue fiber diet for now. If the medicine works with might stop the fiber down the road.

## 2014-09-27 ENCOUNTER — Encounter: Payer: Self-pay | Admitting: Family Medicine

## 2014-10-01 ENCOUNTER — Telehealth: Payer: Self-pay | Admitting: Family Medicine

## 2014-10-01 NOTE — Telephone Encounter (Signed)
Pt called still having problems with constipation.   Call back is [phone]  Thank teri

## 2014-10-01 NOTE — Telephone Encounter (Signed)
See below please-aa 

## 2014-10-02 ENCOUNTER — Other Ambulatory Visit: Payer: Self-pay

## 2014-10-02 ENCOUNTER — Ambulatory Visit
Admission: RE | Admit: 2014-10-02 | Discharge: 2014-10-02 | Disposition: A | Discharge status: Home / Self Care | Payer: Medicaid Other | Source: Ambulatory Visit | Attending: Family Medicine | Admitting: Family Medicine

## 2014-10-02 ENCOUNTER — Encounter (INDEPENDENT_AMBULATORY_CARE_PROVIDER_SITE_OTHER): Payer: Self-pay

## 2014-10-02 DIAGNOSIS — R10819 Abdominal tenderness, unspecified site: Secondary | ICD-10-CM | POA: Diagnosis present

## 2014-10-02 DIAGNOSIS — K5909 Other constipation: Secondary | ICD-10-CM

## 2014-10-02 DIAGNOSIS — K760 Fatty (change of) liver, not elsewhere classified: Secondary | ICD-10-CM | POA: Insufficient documentation

## 2014-10-02 MED ORDER — IOHEXOL 350 MG/ML SOLN
125.0000 mL | Freq: Once | INTRAVENOUS | Status: AC | PRN
  Administered 2014-10-02: 125 mL via INTRAVENOUS

## 2014-10-02 NOTE — Progress Notes (Signed)
Patient requested a GI referral instead of trying a different constipation medication. Order placed.

## 2014-10-02 NOTE — Telephone Encounter (Signed)
Patient states she is taking Miralax, Fiber and Amitiza daily.

## 2014-10-02 NOTE — Telephone Encounter (Signed)
Tasking Amitiza 24 daily.?

## 2014-10-02 NOTE — Telephone Encounter (Signed)
All I have left his to try GI referral or Linzess 290 mg daily in place of Amitiza.

## 2014-10-02 NOTE — Telephone Encounter (Signed)
Patient's emergency contact Dorene Allen advised as directed below. Dorene verbalized understanding, and states she will advise patient as directed below. Dorene will have patient return call.

## 2014-10-09 ENCOUNTER — Ambulatory Visit (INDEPENDENT_AMBULATORY_CARE_PROVIDER_SITE_OTHER): Payer: Medicaid Other | Admitting: Family Medicine

## 2014-10-09 VITALS — BP 98/64 | HR 120 | Temp 97.9°F | Resp 18 | Wt 321.0 lb

## 2014-10-09 DIAGNOSIS — R10819 Abdominal tenderness, unspecified site: Secondary | ICD-10-CM | POA: Diagnosis not present

## 2014-10-09 DIAGNOSIS — E860 Dehydration: Secondary | ICD-10-CM

## 2014-10-09 DIAGNOSIS — K5900 Constipation, unspecified: Secondary | ICD-10-CM | POA: Diagnosis not present

## 2014-10-09 NOTE — Progress Notes (Signed)
Patient ID: Christy [name], female   DOB: April 30, 1955, 59 y.o.   MRN: [id number]    Subjective:  HPI  Constipation follow up: Patient was seen for this issue 2 weeks ago. Patient states she has been taking AMitiza every morning and thinks maybe this helped some. She does complains of having a very watery stool yesterday and today. She has had nausea and some vomiting also. Today she feels weak and dizzy.   Prior to Admission medications   Medication Sig Start Date End Date Taking? Authorizing Provider  aspirin (ASPIRIN EC) 81 MG EC tablet Take 81 mg by mouth daily. Swallow whole.    Historical Provider, MD  benztropine (COGENTIN) 0.5 MG tablet Take 0.5 mg by mouth 2 (two) times daily. One tab in the morning and 2 tabs at bedtime    Historical Provider, MD  docusate sodium (COLACE) 100 MG capsule Take 100 mg by mouth 2 (two) times daily.    Historical Provider, MD  FluPHENAZine HCl (PROLIXIN PO) Take 25 mg by mouth.    Historical Provider, MD  fluticasone (FLONASE) 50 MCG/ACT nasal spray Place into the nose. 02/23/13   Historical Provider, MD  furosemide (LASIX) 40 MG tablet Take 40 mg by mouth.    Historical Provider, MD  gabapentin (NEURONTIN) 300 MG capsule Take 300 mg by mouth 3 (three) times daily.    Historical Provider, MD  glimepiride (AMARYL) 4 MG tablet Take 4 mg by mouth daily with breakfast.    Historical Provider, MD  glucose blood (ACCU-CHEK AVIVA PLUS) test strip  01/11/14   Historical Provider, MD  hydrocortisone 2.5 % lotion HYDROCORTISONE, 2.5% (External Lotion)  1 Lotion Lotion apply to ear eczema twice daily for 0 days  Quantity: 4;  Refills: 1   Ordered :23-Feb-2013  Janey Greaser ;  Started 23-Feb-2013 Active Comments: substitute for Desonide which was not covered by insurance. 02/23/13   Historical Provider, MD  Insulin Glargine (LANTUS SOLOSTAR) 100 UNIT/ML Solostar Pen Inject into the skin. 05/02/14   Historical Provider, MD  loratadine (CLARITIN) 10 MG tablet Take 10  mg by mouth daily.    Historical Provider, MD  lubiprostone (AMITIZA) 24 MCG capsule Take 1 capsule (24 mcg total) by mouth 2 (two) times daily with a meal. 09/25/14   Maple Hudson., MD  meloxicam (MOBIC) 15 MG tablet TAKE 1 TABLET BY MOUTH DAILY AS NEEDED FOR PAIN. 08/22/14   Maple Hudson., MD  metFORMIN (GLUCOPHAGE) 1000 MG tablet Take 1,000 mg by mouth 1 day or 1 dose. With supper    Historical Provider, MD  nystatin cream (MYCOSTATIN) NYSTATIN, 100000 UNIT/GM (External Cream)  1 (one) Cream Cream apply small amount twice daily for 0 days  Quantity: 1;  Refills: 0   Ordered :22-May-2014  Wallace Keller ;  Started 22-May-2014 Active 05/22/14   Historical Provider, MD  omeprazole (PRILOSEC) 20 MG capsule Take 20 mg by mouth daily.    Historical Provider, MD  pantoprazole (PROTONIX) 40 MG tablet Take 40 mg by mouth daily.    Historical Provider, MD  potassium chloride SA (K-DUR,KLOR-CON) 20 MEQ tablet Take 20 mEq by mouth 2 (two) times daily.    Historical Provider, MD  pregabalin (LYRICA) 50 MG capsule Take 50 mg by mouth 3 (three) times daily.    Historical Provider, MD  ranitidine (ZANTAC) 150 MG tablet Take 150 mg by mouth 2 (two) times daily.    Historical Provider, MD  simvastatin (ZOCOR) 10 MG tablet Take 10 mg  by mouth daily.    Historical Provider, MD  triamterene-hydrochlorothiazide (MAXZIDE-25) 37.5-25 MG per tablet TAKE 1 TABLET BY MOUTH EACH DAY 07/25/14   Maple Hudson., MD  valACYclovir (VALTREX) 1000 MG tablet Take 1,000 mg by mouth 2 (two) times daily.    Historical Provider, MD    Patient Active Problem List   Diagnosis Date Noted  . Allergic rhinitis 08/02/2014  . Arthropathia 08/02/2014  . CCF (congestive cardiac failure) 08/02/2014  . Current tobacco use 08/02/2014  . Diabetic neuropathy 08/02/2014  . Genital herpes 08/02/2014  . Acid reflux 08/02/2014  . BP (high blood pressure) 08/02/2014  . HLD (hyperlipidemia) 08/02/2014  . Incontinence  08/02/2014  . Neuropathy 08/02/2014  . Drug noncompliance 08/02/2014  . Adult BMI 30+ 08/02/2014  . Arthritis, degenerative 08/02/2014  . Delusional disorder 08/02/2014  . Acute exacerbation of chronic paranoid schizophrenia 08/02/2014  . Avitaminosis D 08/02/2014    Past Medical History  Diagnosis Date  . Hypertension   . Arthritis   . Genital herpes   . Malaise and fatigue   . Hyperlipidemia   . Mental status alteration   . Edema   . Schizophrenia   . Diabetes mellitus without complication     Patient takes Metformin    Social History   Social History  . Marital Status: Single    Spouse Name: N/A  . Number of Children: N/A  . Years of Education: N/A   Occupational History  . Not on file.   Social History Main Topics  . Smoking status: Current Some Day Smoker    Types: Cigarettes  . Smokeless tobacco: Never Used  . Alcohol Use: No  . Drug Use: No  . Sexual Activity: Not on file   Other Topics Concern  . Not on file   Social History Narrative    Allergies  Allergen Reactions  . Cephalexin Itching  . Peroxide [Hydrogen Peroxide] Swelling  . Shellfish Allergy Nausea And Vomiting    Review of Systems  Constitutional: Positive for chills and malaise/fatigue. Negative for fever.  Eyes: Negative.   Respiratory: Negative.   Cardiovascular: Negative.   Gastrointestinal: Positive for nausea, vomiting, abdominal pain, diarrhea and constipation.  Genitourinary: Negative.   Musculoskeletal: Positive for back pain and joint pain.  Neurological: Positive for dizziness and weakness.  Psychiatric/Behavioral: Negative.     Immunization History  Administered Date(s) Administered  . Td 11/10/2000   Objective:  BP 98/64 mmHg  Pulse 120  Temp(Src) 97.9 F (36.6 C)  Resp 18  Wt 321 lb (145.605 kg)  Physical Exam  Constitutional: She is oriented to person, place, and time and well-developed, well-nourished, and in no distress.  Obese black female in no acute  distress.  HENT:  Head: Normocephalic and atraumatic.  Right Ear: External ear normal.  Left Ear: External ear normal.  Nose: Nose normal.  Eyes: Conjunctivae are normal.  Neck: Neck supple.  Cardiovascular: Normal rate, regular rhythm and normal heart sounds.   Pulmonary/Chest: Effort normal and breath sounds normal.  Abdominal: Soft.  Neurological: She is alert and oriented to person, place, and time.  Skin: Skin is warm and dry.  Psychiatric: Mood, memory, affect and judgment normal.    Lab Results  Component Value Date   WBC 9.4 05/06/2014   HGB 12.5 05/06/2014   HCT 39 05/06/2014   PLT 211 05/06/2014   GLUCOSE 145* 03/16/2013   CHOL 235* 08/30/2012   TRIG 131 08/30/2012   HDL 57 08/30/2012  LDLCALC 152* 08/30/2012   TSH 4.19 05/06/2014   HGBA1C 8.9 08/08/2014    CMP     Component Value Date/Time   NA 130* 05/06/2014   NA 134* 03/16/2013 1256   K 4.0 05/06/2014   K 4.0 03/16/2013 1256   CL 99 03/16/2013 1256   CO2 29 03/16/2013 1256   GLUCOSE 145* 03/16/2013 1256   BUN 10 05/06/2014   BUN 14 03/16/2013 1256   CREATININE 0.8 05/06/2014   CREATININE 0.84 03/16/2013 1256   CALCIUM 8.8 03/16/2013 1256   PROT 7.2 08/11/2012 1652   ALBUMIN 3.8 08/11/2012 1652   AST 13 05/06/2014   AST 19 08/11/2012 1652   ALT 21 05/06/2014   ALT 19 08/11/2012 1652   ALKPHOS 84 05/06/2014   ALKPHOS 82 08/11/2012 1652   BILITOT 0.6 08/11/2012 1652   GFRNONAA >60 03/16/2013 1256   GFRAA >60 03/16/2013 1256    Assessment and Plan :  1. Abdominal tenderness  - CBC with Differential/Platelet - Comprehensive metabolic panel - Amylase - Lipase  2. Constipation, unspecified constipation type Stop Amitiza as  Think this has caused diarrhea and dehydration. - CBC with Differential/Platelet - Comprehensive metabolic panel  3. Dehydration Stop Amitiza and push fluids.RTC 1 week.Note tachycardia. - CBC with Differential/Platelet - Comprehensive metabolic  panel' 4.OA 5.Obesity 6.Schizophrenia  Julieanne Manson MD [facility] 10/09/2014 3:33 PM

## 2014-10-09 NOTE — Patient Instructions (Addendum)
STOP AMITIZA

## 2014-10-10 ENCOUNTER — Telehealth: Payer: Self-pay | Admitting: Emergency Medicine

## 2014-10-10 LAB — COMPREHENSIVE METABOLIC PANEL
A/G RATIO: 1.8 (ref 1.1–2.5)
ALK PHOS: 90 IU/L (ref 39–117)
ALT: 31 IU/L (ref 0–32)
AST: 25 IU/L (ref 0–40)
Albumin: 4.7 g/dL (ref 3.6–4.8)
BUN/Creatinine Ratio: 11 (ref 11–26)
BUN: 11 mg/dL (ref 8–27)
Bilirubin Total: 0.7 mg/dL (ref 0.0–1.2)
CHLORIDE: 85 mmol/L — AB (ref 97–108)
CO2: 28 mmol/L (ref 18–29)
Calcium: 10.1 mg/dL (ref 8.7–10.3)
Creatinine, Ser: 1.01 mg/dL — ABNORMAL HIGH (ref 0.57–1.00)
GFR calc Af Amer: 69 mL/min/{1.73_m2} (ref >59)
GFR calc non Af Amer: 60 mL/min/{1.73_m2} (ref >59)
GLOBULIN, TOTAL: 2.6 g/dL (ref 1.5–4.5)
Glucose: 183 mg/dL — ABNORMAL HIGH (ref 65–99)
POTASSIUM: 3.8 mmol/L (ref 3.5–5.2)
SODIUM: 136 mmol/L (ref 134–144)
Total Protein: 7.3 g/dL (ref 6.0–8.5)

## 2014-10-10 LAB — CBC WITH DIFFERENTIAL/PLATELET
Basophils Absolute: 0.1 10*3/uL (ref 0.0–0.2)
Basos: 0 %
EOS (ABSOLUTE): 0.3 10*3/uL (ref 0.0–0.4)
EOS: 3 %
HEMATOCRIT: 44 % (ref 34.0–46.6)
Hemoglobin: 13.8 g/dL (ref 11.1–15.9)
Immature Grans (Abs): 0 10*3/uL (ref 0.0–0.1)
Immature Granulocytes: 0 %
LYMPHS ABS: 1.9 10*3/uL (ref 0.7–3.1)
Lymphs: 15 %
MCH: 21.8 pg — ABNORMAL LOW (ref 26.6–33.0)
MCHC: 31.4 g/dL — AB (ref 31.5–35.7)
MCV: 70 fL — AB (ref 79–97)
MONOS ABS: 1.2 10*3/uL — AB (ref 0.1–0.9)
Monocytes: 10 %
Neutrophils Absolute: 9.1 10*3/uL — ABNORMAL HIGH (ref 1.4–7.0)
Neutrophils: 72 %
PLATELETS: 257 10*3/uL (ref 150–379)
RBC: 6.32 x10E6/uL — AB (ref 3.77–5.28)
RDW: 17.8 % — AB (ref 12.3–15.4)
WBC: 12.7 10*3/uL — AB (ref 3.4–10.8)

## 2014-10-10 LAB — LIPASE: LIPASE: 13 U/L (ref 0–59)

## 2014-10-10 LAB — AMYLASE: AMYLASE: 41 U/L (ref 31–124)

## 2014-10-10 NOTE — Telephone Encounter (Signed)
Pt still feels bad, a little dizzy, weaker she thinks, bowels are ok-not movement today. Abdominal pain still present, not vomiting since last office visit. She is drinking fluids. Her blood sugar was 190 when she checked it this morning, has bad headache.-aa

## 2014-10-10 NOTE — Telephone Encounter (Signed)
Tried to call pt to see how she was feeling this morning per Dr. Sullivan Lone. LM with a female that answered the phone. Christy [name] was taking her medications right now and she would have her call me back.

## 2014-10-15 ENCOUNTER — Telehealth: Payer: Self-pay | Admitting: Family Medicine

## 2014-10-15 ENCOUNTER — Other Ambulatory Visit: Payer: Self-pay

## 2014-10-15 ENCOUNTER — Ambulatory Visit: Payer: Medicaid Other

## 2014-10-15 MED ORDER — VALACYCLOVIR HCL 1 G PO TABS: 1000.0000 mg | ORAL_TABLET | Freq: Two times a day (BID) | ORAL | Status: DC

## 2014-10-15 MED ORDER — GLIMEPIRIDE 4 MG PO TABS: 4.0000 mg | ORAL_TABLET | Freq: Every day | ORAL | Status: DC

## 2014-10-15 MED ORDER — RANITIDINE HCL 150 MG PO TABS: 150.0000 mg | ORAL_TABLET | Freq: Two times a day (BID) | ORAL | Status: DC

## 2014-10-15 NOTE — Telephone Encounter (Signed)
Victorino Dike has a question about the directions for her valtrex.    Please call her back at [phone]  Thanks Barth Kirks

## 2014-10-16 ENCOUNTER — Ambulatory Visit: Payer: Medicaid Other | Admitting: Family Medicine

## 2014-10-16 NOTE — Telephone Encounter (Signed)
Clarified-aa

## 2014-11-04 ENCOUNTER — Encounter: Payer: Self-pay | Admitting: Family Medicine

## 2014-11-04 ENCOUNTER — Ambulatory Visit (INDEPENDENT_AMBULATORY_CARE_PROVIDER_SITE_OTHER): Payer: Medicaid Other | Admitting: Family Medicine

## 2014-11-04 VITALS — BP 118/74 | HR 82 | Temp 97.6°F | Resp 16 | Wt 328.0 lb

## 2014-11-04 DIAGNOSIS — B372 Candidiasis of skin and nail: Secondary | ICD-10-CM | POA: Diagnosis not present

## 2014-11-04 DIAGNOSIS — J0100 Acute maxillary sinusitis, unspecified: Secondary | ICD-10-CM | POA: Diagnosis not present

## 2014-11-04 DIAGNOSIS — R079 Chest pain, unspecified: Secondary | ICD-10-CM

## 2014-11-04 MED ORDER — AMOXICILLIN 500 MG PO CAPS: 1000.0000 mg | ORAL_CAPSULE | Freq: Two times a day (BID) | ORAL | Status: DC

## 2014-11-04 NOTE — Progress Notes (Signed)
Patient ID: Christy [name], female   DOB: 06/27/1955, 59 y.o.   MRN: [id number]    Subjective:  HPI Pt reports that for the past 2 weeks she has had sinus congestion, pain and pressure. She has green mucus coming from her nose and is coughing up green/yellow sputum. She does reports that she has SOB. She also has itchy/watery eyes, itchy ears and sneezing.   She also reports that the "yeast" rash that is under her breast is still not better. IT is located under both breast and around the sides of her body. It itches and has an odor to it.   Prior to Admission medications   Medication Sig Start Date End Date Taking? Authorizing Provider  aspirin (ASPIRIN EC) 81 MG EC tablet Take 81 mg by mouth daily. Swallow whole.   Yes Historical Provider, MD  benztropine (COGENTIN) 0.5 MG tablet Take 0.5 mg by mouth 2 (two) times daily. One tab in the morning and 2 tabs at bedtime   Yes Historical Provider, MD  docusate sodium (COLACE) 100 MG capsule Take 100 mg by mouth 2 (two) times daily.   Yes Historical Provider, MD  FluPHENAZine HCl (PROLIXIN PO) Take 25 mg by mouth.   Yes Historical Provider, MD  fluticasone (FLONASE) 50 MCG/ACT nasal spray Place into the nose. 02/23/13  Yes Historical Provider, MD  furosemide (LASIX) 40 MG tablet Take 40 mg by mouth.   Yes Historical Provider, MD  gabapentin (NEURONTIN) 300 MG capsule Take 300 mg by mouth 3 (three) times daily.   Yes Historical Provider, MD  glimepiride (AMARYL) 4 MG tablet Take 1 tablet (4 mg total) by mouth daily with breakfast. 10/15/14  Yes Maple Hudson., MD  glucose blood (ACCU-CHEK AVIVA PLUS) test strip  01/11/14  Yes Historical Provider, MD  hydrocortisone 2.5 % lotion HYDROCORTISONE, 2.5% (External Lotion)  1 Lotion Lotion apply to ear eczema twice daily for 0 days  Quantity: 4;  Refills: 1   Ordered :23-Feb-2013  Janey Greaser ;  Started 23-Feb-2013 Active Comments: substitute for Desonide which was not covered by insurance. 02/23/13   Yes Historical Provider, MD  Insulin Glargine (LANTUS SOLOSTAR) 100 UNIT/ML Solostar Pen Inject into the skin. 05/02/14  Yes Historical Provider, MD  loratadine (CLARITIN) 10 MG tablet Take 10 mg by mouth daily.   Yes Historical Provider, MD  lubiprostone (AMITIZA) 24 MCG capsule Take 1 capsule (24 mcg total) by mouth 2 (two) times daily with a meal. 09/25/14  Yes Maple Hudson., MD  meloxicam (MOBIC) 15 MG tablet TAKE 1 TABLET BY MOUTH DAILY AS NEEDED FOR PAIN. 08/22/14  Yes Maple Hudson., MD  metFORMIN (GLUCOPHAGE) 1000 MG tablet Take 1,000 mg by mouth 1 day or 1 dose. With supper   Yes Historical Provider, MD  nystatin cream (MYCOSTATIN) NYSTATIN, 100000 UNIT/GM (External Cream)  1 (one) Cream Cream apply small amount twice daily for 0 days  Quantity: 1;  Refills: 0   Ordered :22-May-2014  Wallace Keller ;  Started 22-May-2014 Active 05/22/14  Yes Historical Provider, MD  omeprazole (PRILOSEC) 20 MG capsule Take 20 mg by mouth daily.   Yes Historical Provider, MD  pantoprazole (PROTONIX) 40 MG tablet Take 40 mg by mouth daily.   Yes Historical Provider, MD  potassium chloride SA (K-DUR,KLOR-CON) 20 MEQ tablet Take 20 mEq by mouth 2 (two) times daily.   Yes Historical Provider, MD  pregabalin (LYRICA) 50 MG capsule Take 50 mg by mouth 3 (three) times daily.  Yes Historical Provider, MD  ranitidine (ZANTAC) 150 MG tablet Take 1 tablet (150 mg total) by mouth 2 (two) times daily. 10/15/14  Yes Richard Hulen Shouts., MD  simvastatin (ZOCOR) 10 MG tablet Take 10 mg by mouth daily.   Yes Historical Provider, MD  triamterene-hydrochlorothiazide (MAXZIDE-25) 37.5-25 MG per tablet TAKE 1 TABLET BY MOUTH EACH DAY 07/25/14  Yes Maple Hudson., MD  valACYclovir (VALTREX) 1000 MG tablet Take 1 tablet (1,000 mg total) by mouth 2 (two) times daily. 10/15/14  Yes Richard Hulen Shouts., MD    Patient Active Problem List   Diagnosis Date Noted  . Allergic rhinitis 08/02/2014  . Arthropathia  08/02/2014  . CCF (congestive cardiac failure) (HCC) 08/02/2014  . Current tobacco use 08/02/2014  . Diabetic neuropathy (HCC) 08/02/2014  . Genital herpes 08/02/2014  . Acid reflux 08/02/2014  . BP (high blood pressure) 08/02/2014  . HLD (hyperlipidemia) 08/02/2014  . Incontinence 08/02/2014  . Neuropathy (HCC) 08/02/2014  . Drug noncompliance 08/02/2014  . Adult BMI 30+ 08/02/2014  . Arthritis, degenerative 08/02/2014  . Delusional disorder (HCC) 08/02/2014  . Acute exacerbation of chronic paranoid schizophrenia (HCC) 08/02/2014  . Avitaminosis D 08/02/2014    Past Medical History  Diagnosis Date  . Hypertension   . Arthritis   . Genital herpes   . Malaise and fatigue   . Hyperlipidemia   . Mental status alteration   . Edema   . Schizophrenia (HCC)   . Diabetes mellitus without complication [facility])     Patient takes Metformin    Social History   Social History  . Marital Status: Single    Spouse Name: N/A  . Number of Children: N/A  . Years of Education: N/A   Occupational History  . Not on file.   Social History Main Topics  . Smoking status: Current Some Day Smoker    Types: Cigarettes  . Smokeless tobacco: Never Used  . Alcohol Use: No  . Drug Use: No  . Sexual Activity: Not on file   Other Topics Concern  . Not on file   Social History Narrative    Allergies  Allergen Reactions  . Cephalexin Itching  . Peroxide [Hydrogen Peroxide] Swelling  . Shellfish Allergy Nausea And Vomiting    Review of Systems  Constitutional: Positive for malaise/fatigue.  HENT: Positive for congestion.   Eyes: Negative.   Respiratory: Positive for cough, sputum production and shortness of breath.   Cardiovascular: Negative.        Patient has nonexertional very vague chest pain that she has had on and off for years.  Gastrointestinal: Negative.   Genitourinary: Negative.   Musculoskeletal: Negative.   Skin: Positive for itching and rash.  Neurological: Positive  for headaches.  Endo/Heme/Allergies: Negative.   Psychiatric/Behavioral: Negative.     Immunization History  Administered Date(s) Administered  . Td 11/10/2000   Objective:  BP 118/74 mmHg  Pulse 82  Temp(Src) 97.6 F (36.4 C) (Oral)  Resp 16  Wt 328 lb (148.78 kg)  SpO2 98%  Physical Exam  Lab Results  Component Value Date   WBC 12.7* 10/09/2014   HGB 12.5 05/06/2014   HCT 44.0 10/09/2014   PLT 211 05/06/2014   GLUCOSE 183* 10/09/2014   CHOL 235* 08/30/2012   TRIG 131 08/30/2012   HDL 57 08/30/2012   LDLCALC 152* 08/30/2012   TSH 4.19 05/06/2014   HGBA1C 8.9 08/08/2014    CMP     Component Value Date/Time  NA 136 10/09/2014 1631   NA 134* 03/16/2013 1256   K 3.8 10/09/2014 1631   K 4.0 03/16/2013 1256   CL 85* 10/09/2014 1631   CL 99 03/16/2013 1256   CO2 28 10/09/2014 1631   CO2 29 03/16/2013 1256   GLUCOSE 183* 10/09/2014 1631   GLUCOSE 145* 03/16/2013 1256   BUN 11 10/09/2014 1631   BUN 14 03/16/2013 1256   CREATININE 1.01* 10/09/2014 1631   CREATININE 0.8 05/06/2014   CREATININE 0.84 03/16/2013 1256   CALCIUM 10.1 10/09/2014 1631   CALCIUM 8.8 03/16/2013 1256   PROT 7.3 10/09/2014 1631   PROT 7.2 08/11/2012 1652   ALBUMIN 4.7 10/09/2014 1631   ALBUMIN 3.8 08/11/2012 1652   AST 25 10/09/2014 1631   AST 19 08/11/2012 1652   ALT 31 10/09/2014 1631   ALT 19 08/11/2012 1652   ALKPHOS 90 10/09/2014 1631   ALKPHOS 82 08/11/2012 1652   BILITOT 0.7 10/09/2014 1631   BILITOT 0.6 08/11/2012 1652   GFRNONAA 60 10/09/2014 1631   GFRNONAA >60 03/16/2013 1256   GFRAA 69 10/09/2014 1631   GFRAA >60 03/16/2013 1256    Assessment and Plan :  1. Chest pain, unspecified chest pain type Follow for now. Symptoms seem noncardiac. May need cardiology referral due to risk factors including family history. - EKG 12-Lead  2. Yeast dermatitis Normal exam today. - Ambulatory referral to Dermatology  3. Acute maxillary sinusitis, recurrence not specified  -  amoxicillin (AMOXIL) 500 MG capsule; Take 2 capsules (1,000 mg total) by mouth 2 (two) times daily.  Dispense: 40 capsule; Refill: 0 4.TIIDM 5.paranoid Shizophrenia 6.Morbid Obesity  I have done the exam and reviewed the above chart and it is accurate to the best of my knowledge.  Julieanne Manson MD [facility] 11/04/2014 10:28 AM

## 2014-11-18 ENCOUNTER — Other Ambulatory Visit: Payer: Self-pay

## 2014-11-18 MED ORDER — FUROSEMIDE 40 MG PO TABS: 40.0000 mg | ORAL_TABLET | Freq: Every day | ORAL | Status: DC

## 2014-11-18 MED ORDER — SIMVASTATIN 10 MG PO TABS: 10.0000 mg | ORAL_TABLET | Freq: Every day | ORAL | Status: DC

## 2014-12-05 ENCOUNTER — Telehealth: Payer: Self-pay | Admitting: Family Medicine

## 2014-12-05 ENCOUNTER — Ambulatory Visit: Payer: Medicaid Other | Admitting: Family Medicine

## 2014-12-05 NOTE — Telephone Encounter (Signed)
terazol cream as what patient is requesting, having similar symptoms like before per Christy [name], please review. Do we need to see her for this?-aa

## 2014-12-05 NOTE — Telephone Encounter (Signed)
Pt contacted office for refill request on the following medications: Terconazole vaginal cream.  [facility].  CB#[phone]/MW

## 2014-12-10 ENCOUNTER — Telehealth: Payer: Self-pay | Admitting: Family Medicine

## 2014-12-10 NOTE — Telephone Encounter (Signed)
Pt called needsa refill on her vaginal yeast infection medication.  [facility]  Her call [phone].  ThanksTeri

## 2014-12-11 ENCOUNTER — Other Ambulatory Visit: Payer: Self-pay

## 2014-12-11 MED ORDER — TERCONAZOLE 0.8 % VA CREA: 1.0000 | TOPICAL_CREAM | Freq: Every day | VAGINAL | Status: DC

## 2014-12-12 ENCOUNTER — Ambulatory Visit (INDEPENDENT_AMBULATORY_CARE_PROVIDER_SITE_OTHER): Payer: Medicaid Other | Admitting: Family Medicine

## 2014-12-12 VITALS — BP 124/68 | HR 98 | Temp 97.6°F | Resp 16 | Wt 322.0 lb

## 2014-12-12 DIAGNOSIS — J45909 Unspecified asthma, uncomplicated: Secondary | ICD-10-CM

## 2014-12-12 DIAGNOSIS — E1143 Type 2 diabetes mellitus with diabetic autonomic (poly)neuropathy: Secondary | ICD-10-CM

## 2014-12-12 DIAGNOSIS — M25562 Pain in left knee: Secondary | ICD-10-CM | POA: Diagnosis not present

## 2014-12-12 MED ORDER — ALBUTEROL SULFATE HFA 108 (90 BASE) MCG/ACT IN AERS: 1.0000 | INHALATION_SPRAY | Freq: Four times a day (QID) | RESPIRATORY_TRACT | Status: DC | PRN

## 2014-12-12 NOTE — Progress Notes (Signed)
Patient ID: Christy [name], female   DOB: 1955/11/03, 59 y.o.   MRN: [id number]    Subjective:  HPI  Diabetes Mellitus Type II, Follow-up:   Lab Results  Component Value Date   HGBA1C 8.9 08/08/2014   HGBA1C 11.5* 05/06/2014    Last seen for diabetes 3 months ago.  Management since then includes none. She reports good compliance with treatment. She is not having side effects.  Current symptoms include: none Home blood sugar records: 130's  Episodes of hypoglycemia? no  Pertinent Labs:    Component Value Date/Time   CHOL 235* 08/30/2012 0531   TRIG 131 08/30/2012 0531   CREATININE 1.01* 10/09/2014 1631   CREATININE 0.8 05/06/2014   CREATININE 0.84 03/16/2013 1256    Wt Readings from Last 3 Encounters:  12/12/14 322 lb (146.058 kg)  11/04/14 328 lb (148.78 kg)  10/09/14 321 lb (145.605 kg)    ------------------------------------------------------------------------   Pt reports that she thinks she has a cold. She has a cough and is coughing up mucus that is green. He ears are still itching.  She also say that she is SOB. She reports that she thinks she had a fever this morning and chills. She said this has been going on for about a week but she shortness of breath started last night.  Prior to Admission medications   Medication Sig Start Date End Date Taking? Authorizing Provider  amoxicillin (AMOXIL) 500 MG capsule Take 2 capsules (1,000 mg total) by mouth 2 (two) times daily. 11/04/14  Yes Richard Hulen Shouts., MD  aspirin (ASPIRIN EC) 81 MG EC tablet Take 81 mg by mouth daily. Swallow whole.   Yes Historical Provider, MD  benztropine (COGENTIN) 0.5 MG tablet Take 0.5 mg by mouth 2 (two) times daily. One tab in the morning and 2 tabs at bedtime   Yes Historical Provider, MD  docusate sodium (COLACE) 100 MG capsule Take 100 mg by mouth 2 (two) times daily.   Yes Historical Provider, MD  FluPHENAZine HCl (PROLIXIN PO) Take 25 mg by mouth.   Yes Historical Provider, MD    fluticasone (FLONASE) 50 MCG/ACT nasal spray Place into the nose. 02/23/13  Yes Historical Provider, MD  furosemide (LASIX) 40 MG tablet Take 1 tablet (40 mg total) by mouth daily. 11/18/14  Yes Richard Hulen Shouts., MD  gabapentin (NEURONTIN) 300 MG capsule Take 300 mg by mouth 3 (three) times daily.   Yes Historical Provider, MD  glimepiride (AMARYL) 4 MG tablet Take 1 tablet (4 mg total) by mouth daily with breakfast. 10/15/14  Yes Maple Hudson., MD  glucose blood (ACCU-CHEK AVIVA PLUS) test strip  01/11/14  Yes Historical Provider, MD  hydrocortisone 2.5 % lotion HYDROCORTISONE, 2.5% (External Lotion)  1 Lotion Lotion apply to ear eczema twice daily for 0 days  Quantity: 4;  Refills: 1   Ordered :23-Feb-2013  Janey Greaser ;  Started 23-Feb-2013 Active Comments: substitute for Desonide which was not covered by insurance. 02/23/13  Yes Historical Provider, MD  Insulin Glargine (LANTUS SOLOSTAR) 100 UNIT/ML Solostar Pen Inject into the skin. 05/02/14  Yes Historical Provider, MD  loratadine (CLARITIN) 10 MG tablet Take 10 mg by mouth daily.   Yes Historical Provider, MD  lubiprostone (AMITIZA) 24 MCG capsule Take 1 capsule (24 mcg total) by mouth 2 (two) times daily with a meal. 09/25/14  Yes Maple Hudson., MD  meloxicam (MOBIC) 15 MG tablet TAKE 1 TABLET BY MOUTH DAILY AS NEEDED FOR PAIN.  08/22/14  Yes Richard Hulen Shouts., MD  metFORMIN (GLUCOPHAGE) 1000 MG tablet Take 1,000 mg by mouth 1 day or 1 dose. With supper   Yes Historical Provider, MD  nystatin cream (MYCOSTATIN) NYSTATIN, 100000 UNIT/GM (External Cream)  1 (one) Cream Cream apply small amount twice daily for 0 days  Quantity: 1;  Refills: 0   Ordered :22-May-2014  Wallace Keller ;  Started 22-May-2014 Active 05/22/14  Yes Historical Provider, MD  omeprazole (PRILOSEC) 20 MG capsule Take 20 mg by mouth daily.   Yes Historical Provider, MD  pantoprazole (PROTONIX) 40 MG tablet Take 40 mg by mouth daily.   Yes  Historical Provider, MD  potassium chloride SA (K-DUR,KLOR-CON) 20 MEQ tablet Take 20 mEq by mouth 2 (two) times daily.   Yes Historical Provider, MD  pregabalin (LYRICA) 50 MG capsule Take 50 mg by mouth 3 (three) times daily.   Yes Historical Provider, MD  ranitidine (ZANTAC) 150 MG tablet Take 1 tablet (150 mg total) by mouth 2 (two) times daily. 10/15/14  Yes Richard Hulen Shouts., MD  simvastatin (ZOCOR) 10 MG tablet Take 1 tablet (10 mg total) by mouth daily. 11/18/14  Yes Richard Hulen Shouts., MD  terconazole (TERAZOL 3) 0.8 % vaginal cream Place 1 applicator vaginally at bedtime. 12/11/14  Yes Richard Hulen Shouts., MD  triamterene-hydrochlorothiazide [facility]) 37.5-25 MG per tablet TAKE 1 TABLET BY MOUTH EACH DAY 07/25/14  Yes Maple Hudson., MD  valACYclovir (VALTREX) 1000 MG tablet Take 1 tablet (1,000 mg total) by mouth 2 (two) times daily. 10/15/14  Yes Richard Hulen Shouts., MD    Patient Active Problem List   Diagnosis Date Noted  . Allergic rhinitis 08/02/2014  . Arthropathia 08/02/2014  . CCF (congestive cardiac failure) (HCC) 08/02/2014  . Current tobacco use 08/02/2014  . Diabetic neuropathy (HCC) 08/02/2014  . Genital herpes 08/02/2014  . Acid reflux 08/02/2014  . BP (high blood pressure) 08/02/2014  . HLD (hyperlipidemia) 08/02/2014  . Incontinence 08/02/2014  . Neuropathy (HCC) 08/02/2014  . Drug noncompliance 08/02/2014  . Adult BMI 30+ 08/02/2014  . Arthritis, degenerative 08/02/2014  . Delusional disorder (HCC) 08/02/2014  . Acute exacerbation of chronic paranoid schizophrenia (HCC) 08/02/2014  . Avitaminosis D 08/02/2014    Past Medical History  Diagnosis Date  . Hypertension   . Arthritis   . Genital herpes   . Malaise and fatigue   . Hyperlipidemia   . Mental status alteration   . Edema   . Schizophrenia (HCC)   . Diabetes mellitus without complication [facility])     Patient takes Metformin    Social History   Social History  . Marital  Status: Single    Spouse Name: N/A  . Number of Children: N/A  . Years of Education: N/A   Occupational History  . Not on file.   Social History Main Topics  . Smoking status: Current Some Day Smoker    Types: Cigarettes  . Smokeless tobacco: Never Used  . Alcohol Use: No  . Drug Use: No  . Sexual Activity: Not on file   Other Topics Concern  . Not on file   Social History Narrative    Allergies  Allergen Reactions  . Cephalexin Itching  . Peroxide [Hydrogen Peroxide] Swelling  . Shellfish Allergy Nausea And Vomiting    Review of Systems  Constitutional: Positive for fever and chills.  HENT: Negative.   Eyes: Negative.   Respiratory: Positive for cough, sputum production and shortness  of breath.   Cardiovascular: Negative.   Gastrointestinal: Negative.   Genitourinary: Negative.   Musculoskeletal: Negative.   Skin: Positive for itching.  Neurological: Negative.   Endo/Heme/Allergies: Negative.   Psychiatric/Behavioral: Negative.     Immunization History  Administered Date(s) Administered  . Td 11/10/2000   Objective:  BP 124/68 mmHg  Pulse 98  Temp(Src) 97.6 F (36.4 C) (Oral)  Resp 16  Wt 322 lb (146.058 kg)  SpO2 95%  Physical Exam  Constitutional: She is oriented to person, place, and time and well-developed, well-nourished, and in no distress.  HENT:  Head: Normocephalic and atraumatic.  Right Ear: External ear normal.  Left Ear: External ear normal.  Nose: Nose normal.  Mouth/Throat: Oropharynx is clear and moist.  Eyes: Conjunctivae and EOM are normal. Pupils are equal, round, and reactive to light.  Neck: Normal range of motion. Neck supple.  Cardiovascular: Normal rate, regular rhythm, normal heart sounds and intact distal pulses.   Pulmonary/Chest: Effort normal and breath sounds normal.  Abdominal: Soft. Bowel sounds are normal.  Musculoskeletal: Normal range of motion.  Neurological: She is alert and oriented to person, place, and  time. She has normal reflexes. Gait normal. GCS score is 15.  Skin: Skin is warm and dry.  Psychiatric: Mood, memory, affect and judgment normal.    Lab Results  Component Value Date   WBC 12.7* 10/09/2014   HGB 12.5 05/06/2014   HCT 44.0 10/09/2014   PLT 211 05/06/2014   GLUCOSE 183* 10/09/2014   CHOL 235* 08/30/2012   TRIG 131 08/30/2012   HDL 57 08/30/2012   LDLCALC 152* 08/30/2012   TSH 4.19 05/06/2014   HGBA1C 8.9 08/08/2014    CMP     Component Value Date/Time   NA 136 10/09/2014 1631   NA 134* 03/16/2013 1256   K 3.8 10/09/2014 1631   K 4.0 03/16/2013 1256   CL 85* 10/09/2014 1631   CL 99 03/16/2013 1256   CO2 28 10/09/2014 1631   CO2 29 03/16/2013 1256   GLUCOSE 183* 10/09/2014 1631   GLUCOSE 145* 03/16/2013 1256   BUN 11 10/09/2014 1631   BUN 14 03/16/2013 1256   CREATININE 1.01* 10/09/2014 1631   CREATININE 0.8 05/06/2014   CREATININE 0.84 03/16/2013 1256   CALCIUM 10.1 10/09/2014 1631   CALCIUM 8.8 03/16/2013 1256   PROT 7.3 10/09/2014 1631   PROT 7.2 08/11/2012 1652   ALBUMIN 4.7 10/09/2014 1631   ALBUMIN 3.8 08/11/2012 1652   AST 25 10/09/2014 1631   AST 19 08/11/2012 1652   ALT 31 10/09/2014 1631   ALT 19 08/11/2012 1652   ALKPHOS 90 10/09/2014 1631   ALKPHOS 82 08/11/2012 1652   BILITOT 0.7 10/09/2014 1631   BILITOT 0.6 08/11/2012 1652   GFRNONAA 60 10/09/2014 1631   GFRNONAA >60 03/16/2013 1256   GFRAA 69 10/09/2014 1631   GFRAA >60 03/16/2013 1256    Assessment and Plan :  1. Diabetic autonomic neuropathy associated with type 2 diabetes mellitus (HCC)  - POCT HgB A1C--8.3 today.  2. Left knee pain/OA  - Ambulatory referral to Orthopedic Surgery  3. Bronchitis, asthmatic, unspecified asthma severity, uncomplicated  - albuterol (PROVENTIL HFA;VENTOLIN HFA) 108 (90 BASE) MCG/ACT inhaler; Inhale 1-2 puffs into the lungs every 6 (six) hours as needed for wheezing or shortness of breath.  Dispense: 1 Inhaler; Refill: 0 4.TIIDM 5.morbid  obesity 6Paranoid schizophrenia  I have done the exam and reviewed the above chart and it is accurate to the best  of my knowledge.  Patient was seen and examined by Dr. Julieanne Mansonichard Gilbert, and noted scribed by Dimas ChyleBrittany Byrd, CMA  Julieanne Mansonichard Gilbert MD [facility] 12/12/2014 4:33 PM

## 2015-01-03 ENCOUNTER — Encounter: Payer: Self-pay | Admitting: *Deleted

## 2015-01-06 ENCOUNTER — Encounter: Admission: RE | Payer: Self-pay | Source: Ambulatory Visit

## 2015-01-06 ENCOUNTER — Ambulatory Visit: Admission: RE | Admit: 2015-01-06 | Payer: Medicaid Other | Source: Ambulatory Visit | Admitting: Gastroenterology

## 2015-01-06 HISTORY — DX: Gastro-esophageal reflux disease without esophagitis: K21.9

## 2015-01-06 SURGERY — COLONOSCOPY WITH PROPOFOL
Anesthesia: General

## 2015-01-08 ENCOUNTER — Other Ambulatory Visit: Payer: Self-pay

## 2015-01-08 MED ORDER — TRIAMTERENE-HCTZ 37.5-25 MG PO TABS: 1.0000 | ORAL_TABLET | Freq: Every day | ORAL | Status: DC

## 2015-01-21 ENCOUNTER — Telehealth: Payer: Self-pay | Admitting: Family Medicine

## 2015-01-21 NOTE — Telephone Encounter (Signed)
Caregiver called saying Christy [name] has been having diarrhea, and throwing up going on for several days.  Per patients account.  Otherwise she seems fine.  She missed her appt for the colonoscopy because of transportation.  Please advise [phone] is the caregivers number.  ThanksTeri

## 2015-01-21 NOTE — Telephone Encounter (Signed)
Please review-aa 

## 2015-01-21 NOTE — Telephone Encounter (Signed)
Should probably be seen. OK to see a PA.

## 2015-01-21 NOTE — Telephone Encounter (Signed)
Per pt caregiver. She has not been eating as well as she normally is. She has been doing a lot of sleeping and some chills at night only, but other that she does not act like she is sick. This started on Friday/Saturday of this past weekend. They were concerned because she did not think if it was s stomach bug she should be still lingering. Caregiver would like to know if she needs an appointment or it could be viral.

## 2015-01-21 NOTE — Telephone Encounter (Signed)
Tried to call pt caregiver, no answer. It sounded like someone picked up but I could not hear anything.

## 2015-01-22 NOTE — Telephone Encounter (Signed)
Appointment scheduled for 01/24/15 with Jenni/MW

## 2015-01-24 ENCOUNTER — Ambulatory Visit (INDEPENDENT_AMBULATORY_CARE_PROVIDER_SITE_OTHER): Payer: Medicaid Other | Admitting: Physician Assistant

## 2015-01-24 ENCOUNTER — Encounter: Payer: Self-pay | Admitting: Physician Assistant

## 2015-01-24 VITALS — BP 102/60 | HR 98 | Temp 97.8°F | Resp 20 | Wt 327.0 lb

## 2015-01-24 DIAGNOSIS — R1110 Vomiting, unspecified: Secondary | ICD-10-CM | POA: Diagnosis not present

## 2015-01-24 DIAGNOSIS — D72829 Elevated white blood cell count, unspecified: Secondary | ICD-10-CM

## 2015-01-24 DIAGNOSIS — R1084 Generalized abdominal pain: Secondary | ICD-10-CM

## 2015-01-24 DIAGNOSIS — K2970 Gastritis, unspecified, without bleeding: Secondary | ICD-10-CM | POA: Diagnosis not present

## 2015-01-24 MED ORDER — ONDANSETRON HCL 4 MG PO TABS: 4.0000 mg | ORAL_TABLET | Freq: Three times a day (TID) | ORAL | Status: DC | PRN

## 2015-01-24 NOTE — Progress Notes (Signed)
Patient ID: Christy [name], female   DOB: April 22, 1955, 59 y.o.   MRN: [id number]       Patient: Christy [name] Female    DOB: 1956/01/19   59 y.o.   MRN: [id number] Visit Date: 01/24/2015  Today's Provider: Margaretann Loveless, PA-C   Chief Complaint  Patient presents with  . Abdominal Pain   Subjective:    HPI  Patient has had abdominal pain and vomiting since since week before christmas-about 2 weeks. She is not able to keep food or fluids down, patient states this happens every day. Has had some sweats, chills, diarrhea. She also had similar symptoms in September 2016 and had a complete workup. She was also scheduled to have a colonoscopy due to this but she did not have a colonoscopy secondary to transportation issues.    Allergies  Allergen Reactions  . Cephalexin Itching  . Peroxide [Hydrogen Peroxide] Swelling  . Shellfish Allergy Nausea And Vomiting   Previous Medications   ALBUTEROL (PROVENTIL HFA;VENTOLIN HFA) 108 (90 BASE) MCG/ACT INHALER    Inhale 1-2 puffs into the lungs every 6 (six) hours as needed for wheezing or shortness of breath.   AMOXICILLIN (AMOXIL) 500 MG CAPSULE    Take 2 capsules (1,000 mg total) by mouth 2 (two) times daily.   ASPIRIN (ASPIRIN EC) 81 MG EC TABLET    Take 81 mg by mouth daily. Swallow whole.   BENZTROPINE (COGENTIN) 0.5 MG TABLET    Take 0.5 mg by mouth 2 (two) times daily. One tab in the morning and 2 tabs at bedtime   DOCUSATE SODIUM (COLACE) 100 MG CAPSULE    Take 100 mg by mouth 2 (two) times daily.   FLUPHENAZINE HCL (PROLIXIN PO)    Take 25 mg by mouth.   FLUTICASONE (FLONASE) 50 MCG/ACT NASAL SPRAY    Place into the nose.   FUROSEMIDE (LASIX) 40 MG TABLET    Take 1 tablet (40 mg total) by mouth daily.   GABAPENTIN (NEURONTIN) 300 MG CAPSULE    Take 300 mg by mouth 3 (three) times daily.   GLIMEPIRIDE (AMARYL) 4 MG TABLET    Take 1 tablet (4 mg total) by mouth daily with breakfast.   GLUCOSE BLOOD (ACCU-CHEK AVIVA PLUS) TEST STRIP         HYDROCORTISONE 2.5 % LOTION    HYDROCORTISONE, 2.5% (External Lotion)  1 Lotion Lotion apply to ear eczema twice daily for 0 days  Quantity: 4;  Refills: 1   Ordered :23-Feb-2013  Janey Greaser ;  Started 23-Feb-2013 Active Comments: substitute for Desonide which was not covered by insurance.   INSULIN GLARGINE (LANTUS SOLOSTAR) 100 UNIT/ML SOLOSTAR PEN    Inject into the skin.   LORATADINE (CLARITIN) 10 MG TABLET    Take 10 mg by mouth daily.   LUBIPROSTONE (AMITIZA) 24 MCG CAPSULE    Take 1 capsule (24 mcg total) by mouth 2 (two) times daily with a meal.   MELOXICAM (MOBIC) 15 MG TABLET    TAKE 1 TABLET BY MOUTH DAILY AS NEEDED FOR PAIN.   METFORMIN (GLUCOPHAGE) 1000 MG TABLET    Take 1,000 mg by mouth 1 day or 1 dose. With supper   NYSTATIN CREAM (MYCOSTATIN)    NYSTATIN, 100000 UNIT/GM (External Cream)  1 (one) Cream Cream apply small amount twice daily for 0 days  Quantity: 1;  Refills: 0   Ordered :22-May-2014  Wallace Keller ;  Started 22-May-2014 Active   OMEPRAZOLE (PRILOSEC) 20 MG CAPSULE  Take 20 mg by mouth daily.   PANTOPRAZOLE (PROTONIX) 40 MG TABLET    Take 40 mg by mouth daily.   POTASSIUM CHLORIDE SA (K-DUR,KLOR-CON) 20 MEQ TABLET    Take 20 mEq by mouth 2 (two) times daily.   PREGABALIN (LYRICA) 50 MG CAPSULE    Take 50 mg by mouth 3 (three) times daily.   RANITIDINE (ZANTAC) 150 MG TABLET    Take 1 tablet (150 mg total) by mouth 2 (two) times daily.   SIMVASTATIN (ZOCOR) 10 MG TABLET    Take 1 tablet (10 mg total) by mouth daily.   TERCONAZOLE (TERAZOL 3) 0.8 % VAGINAL CREAM    Place 1 applicator vaginally at bedtime.   TRIAMTERENE-HYDROCHLOROTHIAZIDE (MAXZIDE-25) 37.5-25 MG TABLET    Take 1 tablet by mouth daily.   VALACYCLOVIR (VALTREX) 1000 MG TABLET    Take 1 tablet (1,000 mg total) by mouth 2 (two) times daily.    Review of Systems  Constitutional: Positive for chills and fatigue. Negative for fever.  HENT: Negative.   Eyes: Negative.   Respiratory:  Positive for shortness of breath. Negative for cough, chest tightness and wheezing.   Cardiovascular: Positive for leg swelling. Negative for chest pain and palpitations.  Gastrointestinal: Positive for nausea, vomiting, abdominal pain and diarrhea. Negative for constipation, blood in stool and rectal pain.  Endocrine: Negative.   Genitourinary: Negative.   Musculoskeletal: Positive for back pain, joint swelling, arthralgias and gait problem.  Neurological: Positive for tremors and weakness.    Social History  Substance Use Topics  . Smoking status: Current Some Day Smoker -- 0.25 packs/day    Types: Cigarettes  . Smokeless tobacco: Never Used  . Alcohol Use: No   Objective:   BP 102/60 mmHg  Pulse 98  Temp(Src) 97.8 F (36.6 C)  Resp 20  Wt 327 lb (148.326 kg)  Physical Exam  Constitutional: She is oriented to person, place, and time. She appears well-developed and well-nourished. No distress.  Cardiovascular: Normal rate, regular rhythm and normal heart sounds.  Exam reveals no gallop and no friction rub.   No murmur heard. Pulmonary/Chest: Effort normal and breath sounds normal. No respiratory distress. She has no wheezes. She has no rales.  Abdominal: Soft. Normal appearance and bowel sounds are normal. She exhibits no distension and no mass. There is no hepatosplenomegaly. There is generalized tenderness. There is no rebound, no guarding, no CVA tenderness, no tenderness at McBurney's point and negative [name]'s sign.  Neurological: She is alert and oriented to person, place, and time.  Skin: Skin is warm and dry. She is not diaphoretic.        Assessment & Plan:     1. Generalized abdominal pain I will check labs again as below. I will follow-up with her pending these lab results. I do question if it is a viral gastritis versus possible diabetic gastroparesis. We did discuss the possibility of getting an abdominal x-ray as well today but she stated that her transportation  would most likely not be able to take her over there. I did discuss with her that we would hold off on the x-ray at this time but that if her symptoms do not improve with the Zofran which I'm giving her as below to treat the nausea and vomiting that we may likely need to proceed with this as well. She voiced understanding. I did also encourage her to make sure to be staying well hydrated and to drink plenty of fluids as she may  become dehydrated and cause and electrolyte balance due to the nausea, vomiting and diarrhea. She voiced understanding for this as well. She is to call the office if symptoms fail to improve or worsen. I also advised her to go to the hospital if symptoms worsen over the weekend. - CBC With Differential - Comprehensive Metabolic Panel (CMET) - Lipase - Amylase - ondansetron (ZOFRAN) 4 MG tablet; Take 1 tablet (4 mg total) by mouth every 8 (eight) hours as needed for nausea or vomiting.  Dispense: 20 tablet; Refill: 0  2. Intractable vomiting with nausea, vomiting of unspecified type We'll treat nausea and vomiting with Zofran as below. I did discuss with her that she should only take 1 tablet every 8 hours as needed for nausea and vomiting. She voiced understanding. She is to call the office if symptoms fail to improve or worsen. She is to go to the hospital if symptoms worsen over the holiday weekend. - ondansetron (ZOFRAN) 4 MG tablet; Take 1 tablet (4 mg total) by mouth every 8 (eight) hours as needed for nausea or vomiting.  Dispense: 20 tablet; Refill: 0  3. Gastritis Question viral gastritis versus diabetic gastroparesis. I will check labs and treat nausea with Zofran as above. I will follow-up with her pending the lab results. May consider further testing if no improvement.       Margaretann Loveless, PA-C  [facility]

## 2015-01-24 NOTE — Patient Instructions (Addendum)
Gastritis, Adult Gastritis is soreness and swelling (inflammation) of the lining of the stomach. Gastritis can develop as a sudden onset (acute) or long-term (chronic) condition. If gastritis is not treated, it can lead to stomach bleeding and ulcers. CAUSES  Gastritis occurs when the stomach lining is weak or damaged. Digestive juices from the stomach then inflame the weakened stomach lining. The stomach lining may be weak or damaged due to viral or bacterial infections. One common bacterial infection is the Helicobacter pylori infection. Gastritis can also result from excessive alcohol consumption, taking certain medicines, or having too much acid in the stomach.  SYMPTOMS  In some cases, there are no symptoms. When symptoms are present, they may include:  Pain or a burning sensation in the upper abdomen.  Nausea.  Vomiting.  An uncomfortable feeling of fullness after eating. DIAGNOSIS  Your caregiver may suspect you have gastritis based on your symptoms and a physical exam. To determine the cause of your gastritis, your caregiver may perform the following:  Blood or stool tests to check for the H pylori bacterium.  Gastroscopy. A thin, flexible tube (endoscope) is passed down the esophagus and into the stomach. The endoscope has a light and camera on the end. Your caregiver uses the endoscope to view the inside of the stomach.  Taking a tissue sample (biopsy) from the stomach to examine under a microscope. TREATMENT  Depending on the cause of your gastritis, medicines may be prescribed. If you have a bacterial infection, such as an H pylori infection, antibiotics may be given. If your gastritis is caused by too much acid in the stomach, H2 blockers or antacids may be given. Your caregiver may recommend that you stop taking aspirin, ibuprofen, or other nonsteroidal anti-inflammatory drugs (NSAIDs). HOME CARE INSTRUCTIONS  Only take over-the-counter or prescription medicines as directed by  your caregiver.  If you were given antibiotic medicines, take them as directed. Finish them even if you start to feel better.  Drink enough fluids to keep your urine clear or pale yellow.  Avoid foods and drinks that make your symptoms worse, such as:  Caffeine or alcoholic drinks.  Chocolate.  Peppermint or mint flavorings.  Garlic and onions.  Spicy foods.  Citrus fruits, such as oranges, lemons, or limes.  Tomato-based foods such as sauce, chili, salsa, and pizza.  Fried and fatty foods.  Eat small, frequent meals instead of large meals. SEEK IMMEDIATE MEDICAL CARE IF:   You have black or dark red stools.  You vomit blood or material that looks like coffee grounds.  You are unable to keep fluids down.  Your abdominal pain gets worse.  You have a fever.  You do not feel better after 1 week.  You have any other questions or concerns. MAKE SURE YOU:  Understand these instructions.  Will watch your condition.  Will get help right away if you are not doing well or get worse.   This information is not intended to replace advice given to you by your health care provider. Make sure you discuss any questions you have with your health care provider.   Document Released: 01/05/2001 Document Revised: 07/13/2011 Document Reviewed: 02/24/2011 Elsevier Interactive Patient Education 2016 Elsevier Inc.  Nausea and Vomiting Nausea is a sick feeling that often comes before throwing up (vomiting). Vomiting is a reflex where stomach contents come out of your mouth. Vomiting can cause severe loss of body fluids (dehydration). Children and elderly adults can become dehydrated quickly, especially if they also have  diarrhea. Nausea and vomiting are symptoms of a condition or disease. It is important to find the cause of your symptoms. CAUSES   Direct irritation of the stomach lining. This irritation can result from increased acid production (gastroesophageal reflux disease),  infection, food poisoning, taking certain medicines (such as nonsteroidal anti-inflammatory drugs), alcohol use, or tobacco use.  Signals from the brain.These signals could be caused by a headache, heat exposure, an inner ear disturbance, increased pressure in the brain from injury, infection, a tumor, or a concussion, pain, emotional stimulus, or metabolic problems.  An obstruction in the gastrointestinal tract (bowel obstruction).  Illnesses such as diabetes, hepatitis, gallbladder problems, appendicitis, kidney problems, cancer, sepsis, atypical symptoms of a heart attack, or eating disorders.  Medical treatments such as chemotherapy and radiation.  Receiving medicine that makes you sleep (general anesthetic) during surgery. DIAGNOSIS Your caregiver may ask for tests to be done if the problems do not improve after a few days. Tests may also be done if symptoms are severe or if the reason for the nausea and vomiting is not clear. Tests may include:  Urine tests.  Blood tests.  Stool tests.  Cultures (to look for evidence of infection).  X-rays or other imaging studies. Test results can help your caregiver make decisions about treatment or the need for additional tests. TREATMENT You need to stay well hydrated. Drink frequently but in small amounts.You may wish to drink water, sports drinks, clear broth, or eat frozen ice pops or gelatin dessert to help stay hydrated.When you eat, eating slowly may help prevent nausea.There are also some antinausea medicines that may help prevent nausea. HOME CARE INSTRUCTIONS   Take all medicine as directed by your caregiver.  If you do not have an appetite, do not force yourself to eat. However, you must continue to drink fluids.  If you have an appetite, eat a normal diet unless your caregiver tells you differently.  Eat a variety of complex carbohydrates (rice, wheat, potatoes, bread), lean meats, yogurt, fruits, and vegetables.  Avoid  high-fat foods because they are more difficult to digest.  Drink enough water and fluids to keep your urine clear or pale yellow.  If you are dehydrated, ask your caregiver for specific rehydration instructions. Signs of dehydration may include:  Severe thirst.  Dry lips and mouth.  Dizziness.  Dark urine.  Decreasing urine frequency and amount.  Confusion.  Rapid breathing or pulse. SEEK IMMEDIATE MEDICAL CARE IF:   You have blood or brown flecks (like coffee grounds) in your vomit.  You have black or bloody stools.  You have a severe headache or stiff neck.  You are confused.  You have severe abdominal pain.  You have chest pain or trouble breathing.  You do not urinate at least once every 8 hours.  You develop cold or clammy skin.  You continue to vomit for longer than 24 to 48 hours.  You have a fever. MAKE SURE YOU:   Understand these instructions.  Will watch your condition.  Will get help right away if you are not doing well or get worse.   This information is not intended to replace advice given to you by your health care provider. Make sure you discuss any questions you have with your health care provider.   Document Released: 01/11/2005 Document Revised: 04/05/2011 Document Reviewed: 06/10/2010 Elsevier Interactive Patient Education 2016 Elsevier Inc.  Ondansetron tablets What is this medicine? ONDANSETRON (on DAN se tron) is used to treat nausea and vomiting  caused by chemotherapy. It is also used to prevent or treat nausea and vomiting after surgery. This medicine may be used for other purposes; ask your health care provider or pharmacist if you have questions. What should I tell my health care provider before I take this medicine? They need to know if you have any of these conditions: -heart disease -history of irregular heartbeat -liver disease -low levels of magnesium or potassium in the blood -an unusual or allergic reaction to  ondansetron, granisetron, other medicines, foods, dyes, or preservatives -pregnant or trying to get pregnant -breast-feeding How should I use this medicine? Take this medicine by mouth with a glass of water. Follow the directions on your prescription label. Take your doses at regular intervals. Do not take your medicine more often than directed. Talk to your pediatrician regarding the use of this medicine in children. Special care may be needed. Overdosage: If you think you have taken too much of this medicine contact a poison control center or emergency room at once. NOTE: This medicine is only for you. Do not share this medicine with others. What if I miss a dose? If you miss a dose, take it as soon as you can. If it is almost time for your next dose, take only that dose. Do not take double or extra doses. What may interact with this medicine? Do not take this medicine with any of the following medications: -apomorphine -certain medicines for fungal infections like fluconazole, itraconazole, ketoconazole, posaconazole, voriconazole -cisapride -dofetilide -dronedarone -pimozide -thioridazine -ziprasidone This medicine may also interact with the following medications: -carbamazepine -certain medicines for depression, anxiety, or psychotic disturbances -fentanyl -linezolid -MAOIs like Carbex, Eldepryl, Marplan, Nardil, and Parnate -methylene blue (injected into a vein) -other medicines that prolong the QT interval (cause an abnormal heart rhythm) -phenytoin -rifampicin -tramadol This list may not describe all possible interactions. Give your health care provider a list of all the medicines, herbs, non-prescription drugs, or dietary supplements you use. Also tell them if you smoke, drink alcohol, or use illegal drugs. Some items may interact with your medicine. What should I watch for while using this medicine? Check with your doctor or health care professional right away if you have  any sign of an allergic reaction. What side effects may I notice from receiving this medicine? Side effects that you should report to your doctor or health care professional as soon as possible: -allergic reactions like skin rash, itching or hives, swelling of the face, lips or tongue -breathing problems -confusion -dizziness -fast or irregular heartbeat -feeling faint or lightheaded, falls -fever and chills -loss of balance or coordination -seizures -sweating -swelling of the hands or feet -tightness in the chest -tremors -unusually weak or tired Side effects that usually do not require medical attention (report to your doctor or health care professional if they continue or are bothersome): -constipation or diarrhea -headache This list may not describe all possible side effects. Call your doctor for medical advice about side effects. You may report side effects to FDA at 1-800-FDA-1088. Where should I keep my medicine? Keep out of the reach of children. Store between 2 and 30 degrees C (36 and 86 degrees F). Throw away any unused medicine after the expiration date. NOTE: This sheet is a summary. It may not cover all possible information. If you have questions about this medicine, talk to your doctor, pharmacist, or health care provider.    2016, Elsevier/Gold Standard. (2012-10-18 16:27:45)

## 2015-01-25 LAB — AMYLASE: Amylase: 29 U/L — ABNORMAL LOW (ref 31–124)

## 2015-01-25 LAB — COMPREHENSIVE METABOLIC PANEL
A/G RATIO: 1.7 (ref 1.1–2.5)
ALBUMIN: 4.4 g/dL (ref 3.6–4.8)
ALK PHOS: 93 IU/L (ref 39–117)
ALT: 21 IU/L (ref 0–32)
AST: 19 IU/L (ref 0–40)
BUN / CREAT RATIO: 8 — AB (ref 11–26)
BUN: 8 mg/dL (ref 8–27)
Bilirubin Total: 0.4 mg/dL (ref 0.0–1.2)
CALCIUM: 10 mg/dL (ref 8.7–10.3)
CO2: 22 mmol/L (ref 18–29)
CREATININE: 0.98 mg/dL (ref 0.57–1.00)
Chloride: 91 mmol/L — ABNORMAL LOW (ref 96–106)
GFR calc Af Amer: 72 mL/min/{1.73_m2} (ref >59)
GFR, EST NON AFRICAN AMERICAN: 62 mL/min/{1.73_m2} (ref >59)
GLOBULIN, TOTAL: 2.6 g/dL (ref 1.5–4.5)
Glucose: 268 mg/dL — ABNORMAL HIGH (ref 65–99)
POTASSIUM: 4 mmol/L (ref 3.5–5.2)
SODIUM: 136 mmol/L (ref 134–144)
Total Protein: 7 g/dL (ref 6.0–8.5)

## 2015-01-25 LAB — CBC WITH DIFFERENTIAL
Basophils Absolute: 0 10*3/uL (ref 0.0–0.2)
Basos: 0 %
EOS (ABSOLUTE): 0.2 10*3/uL (ref 0.0–0.4)
Eos: 1 %
HEMATOCRIT: 38.7 % (ref 34.0–46.6)
Hemoglobin: 12.6 g/dL (ref 11.1–15.9)
IMMATURE GRANULOCYTES: 0 %
Immature Grans (Abs): 0 10*3/uL (ref 0.0–0.1)
LYMPHS ABS: 1.7 10*3/uL (ref 0.7–3.1)
Lymphs: 12 %
MCH: 22.8 pg — ABNORMAL LOW (ref 26.6–33.0)
MCHC: 32.6 g/dL (ref 31.5–35.7)
MCV: 70 fL — AB (ref 79–97)
MONOS ABS: 1.2 10*3/uL — AB (ref 0.1–0.9)
Monocytes: 9 %
Neutrophils Absolute: 10.9 10*3/uL — ABNORMAL HIGH (ref 1.4–7.0)
Neutrophils: 78 %
RBC: 5.53 x10E6/uL — AB (ref 3.77–5.28)
RDW: 17.7 % — ABNORMAL HIGH (ref 12.3–15.4)
WBC: 14.1 10*3/uL — AB (ref 3.4–10.8)

## 2015-01-25 LAB — LIPASE: Lipase: 19 U/L (ref 0–59)

## 2015-01-28 ENCOUNTER — Telehealth: Payer: Self-pay

## 2015-01-28 MED ORDER — SULFAMETHOXAZOLE-TRIMETHOPRIM 800-160 MG PO TABS: 1.0000 | ORAL_TABLET | Freq: Two times a day (BID) | ORAL | Status: DC

## 2015-01-28 NOTE — Telephone Encounter (Signed)
Advised patient as below. Please send in abx into pharmacy. [facility] Thanks!

## 2015-01-28 NOTE — Telephone Encounter (Signed)
-----   Message from Margaretann LovelessJennifer M Burnette, [place] sent at 01/28/2015  9:24 AM EST ----- White blood cell count is elevated indicating possible infectious nature. I will send in an antibiotic to her pharmacy. She is to call the office if symptoms have not improved. When she finishes the antibiotic if she is still having symptoms we can recheck her white blood cell count to see if it is still elevated.

## 2015-01-28 NOTE — Addendum Note (Signed)
Addended by: Margaretann LovelessBURNETTE, JENNIFER M on: 01/28/2015 09:26 AM   Modules accepted: Orders

## 2015-02-06 ENCOUNTER — Telehealth: Payer: Self-pay | Admitting: Family Medicine

## 2015-02-06 DIAGNOSIS — B3731 Acute candidiasis of vulva and vagina: Secondary | ICD-10-CM

## 2015-02-06 DIAGNOSIS — B373 Candidiasis of vulva and vagina: Secondary | ICD-10-CM

## 2015-02-06 MED ORDER — TERCONAZOLE 0.8 % VA CREA: 1.0000 | TOPICAL_CREAM | Freq: Every day | VAGINAL | Status: DC

## 2015-02-06 NOTE — Telephone Encounter (Signed)
Please review-aa 

## 2015-02-06 NOTE — Telephone Encounter (Signed)
Med sent to pharmacy. Tried to call pt LMTCB

## 2015-02-06 NOTE — Telephone Encounter (Signed)
Pt called for refill terconazole (TERAZOL 3) 0.8 % vaginal cream  [facility]  Thanks Barth Kirkseri

## 2015-02-06 NOTE — Telephone Encounter (Signed)
1 refill 

## 2015-02-10 LAB — POCT GLYCOSYLATED HEMOGLOBIN (HGB A1C): HEMOGLOBIN A1C: 8.3

## 2015-02-11 ENCOUNTER — Ambulatory Visit (INDEPENDENT_AMBULATORY_CARE_PROVIDER_SITE_OTHER): Payer: Medicaid Other | Admitting: Family Medicine

## 2015-02-11 ENCOUNTER — Encounter: Payer: Self-pay | Admitting: Family Medicine

## 2015-02-11 VITALS — BP 112/72 | HR 104 | Temp 97.5°F | Resp 18 | Wt 320.0 lb

## 2015-02-11 DIAGNOSIS — R1084 Generalized abdominal pain: Secondary | ICD-10-CM | POA: Diagnosis not present

## 2015-02-11 NOTE — Progress Notes (Signed)
Patient ID: Christy [name], female   DOB: July 04, 1955, 60 y.o.   MRN: [id number]    Subjective:  HPI Pt is here for a follow from when she saw Jenni on 01/24/15. She saw Tawanna Sat for vomiting and abdominal pain. She checked labs ( CBC, Met C, lipase, Amylase) and her WBC was elevated. Tawanna Sat told her she needed it rechecked to make sure it went down after she finished her antibiotics. Pt is no longer having vomiting and abdominal pain, but still does not feel well. She has a hard time resting. She feels the need to move her legs all the time and can not seem to get comfortable.   Prior to Admission medications   Medication Sig Start Date End Date Taking? Authorizing Provider  albuterol (PROVENTIL HFA;VENTOLIN HFA) 108 (90 BASE) MCG/ACT inhaler Inhale 1-2 puffs into the lungs every 6 (six) hours as needed for wheezing or shortness of breath. 12/12/14  Yes Kahlen Boyde Maceo Pro., MD  aspirin (ASPIRIN EC) 81 MG EC tablet Take 81 mg by mouth daily. Swallow whole.   Yes Historical Provider, MD  benztropine (COGENTIN) 0.5 MG tablet Take 0.5 mg by mouth 2 (two) times daily. One tab in the morning and 2 tabs at bedtime   Yes Historical Provider, MD  docusate sodium (COLACE) 100 MG capsule Take 100 mg by mouth 2 (two) times daily.   Yes Historical Provider, MD  FluPHENAZine HCl (PROLIXIN PO) Take 25 mg by mouth.   Yes Historical Provider, MD  fluticasone (FLONASE) 50 MCG/ACT nasal spray Place into the nose. 02/23/13  Yes Historical Provider, MD  furosemide (LASIX) 40 MG tablet Take 1 tablet (40 mg total) by mouth daily. 11/18/14  Yes Izzy Courville Maceo Pro., MD  gabapentin (NEURONTIN) 300 MG capsule Take 300 mg by mouth 3 (three) times daily.   Yes Historical Provider, MD  glimepiride (AMARYL) 4 MG tablet Take 1 tablet (4 mg total) by mouth daily with breakfast. 10/15/14  Yes Jerrol Banana., MD  glucose blood (ACCU-CHEK AVIVA PLUS) test strip  01/11/14  Yes Historical Provider, MD  hydrocortisone 2.5 % lotion  HYDROCORTISONE, 2.5% (External Lotion)  1 Lotion Lotion apply to ear eczema twice daily for 0 days  Quantity: 4;  Refills: 1   Ordered :23-Feb-2013  Althea Charon ;  Started 23-Feb-2013 Active Comments: substitute for Desonide which was not covered by insurance. 02/23/13  Yes Historical Provider, MD  Insulin Glargine (LANTUS SOLOSTAR) 100 UNIT/ML Solostar Pen Inject into the skin. 05/02/14  Yes Historical Provider, MD  loratadine (CLARITIN) 10 MG tablet Take 10 mg by mouth daily.   Yes Historical Provider, MD  lubiprostone (AMITIZA) 24 MCG capsule Take 1 capsule (24 mcg total) by mouth 2 (two) times daily with a meal. 09/25/14  Yes Jerrol Banana., MD  meloxicam (MOBIC) 15 MG tablet TAKE 1 TABLET BY MOUTH DAILY AS NEEDED FOR PAIN. 08/22/14  Yes Jerrol Banana., MD  metFORMIN (GLUCOPHAGE) 1000 MG tablet Take 1,000 mg by mouth 1 day or 1 dose. With supper   Yes Historical Provider, MD  nystatin cream (MYCOSTATIN) NYSTATIN, 100000 UNIT/GM (External Cream)  1 (one) Cream Cream apply small amount twice daily for 0 days  Quantity: 1;  Refills: 0   Ordered :22-May-2014  Sharen Hones ;  Started 22-May-2014 Active 05/22/14  Yes Historical Provider, MD  omeprazole (PRILOSEC) 20 MG capsule Take 20 mg by mouth daily.   Yes Historical Provider, MD  ondansetron (ZOFRAN) 4 MG tablet Take  1 tablet (4 mg total) by mouth every 8 (eight) hours as needed for nausea or vomiting. 01/24/15  Yes Clearnce Sorrel Burnette, PA-C  pantoprazole (PROTONIX) 40 MG tablet Take 40 mg by mouth daily.   Yes Historical Provider, MD  potassium chloride SA (K-DUR,KLOR-CON) 20 MEQ tablet Take 20 mEq by mouth 2 (two) times daily.   Yes Historical Provider, MD  pregabalin (LYRICA) 50 MG capsule Take 50 mg by mouth 3 (three) times daily.   Yes Historical Provider, MD  ranitidine (ZANTAC) 150 MG tablet Take 1 tablet (150 mg total) by mouth 2 (two) times daily. 10/15/14  Yes Triniti Gruetzmacher Maceo Pro., MD  simvastatin (ZOCOR) 10 MG tablet  Take 1 tablet (10 mg total) by mouth daily. 11/18/14  Yes Georgian Mcclory Maceo Pro., MD  sulfamethoxazole-trimethoprim (BACTRIM DS,SEPTRA DS) 800-160 MG tablet Take 1 tablet by mouth 2 (two) times daily. 01/28/15  Yes Clearnce Sorrel Burnette, PA-C  terconazole (TERAZOL 3) 0.8 % vaginal cream Place 1 applicator vaginally at bedtime. 02/06/15  Yes Uvaldo Rybacki Maceo Pro., MD  triamterene-hydrochlorothiazide (HDQQIWL-79) 37.5-25 MG tablet Take 1 tablet by mouth daily. 01/08/15  Yes Tata Timmins Maceo Pro., MD  valACYclovir (VALTREX) 1000 MG tablet Take 1 tablet (1,000 mg total) by mouth 2 (two) times daily. 10/15/14  Yes Jonel Sick Maceo Pro., MD    Patient Active Problem List   Diagnosis Date Noted  . Allergic rhinitis 08/02/2014  . Arthropathia 08/02/2014  . CCF (congestive cardiac failure) ([place]) 08/02/2014  . Current tobacco use 08/02/2014  . Diabetic neuropathy ([place]) 08/02/2014  . Genital herpes 08/02/2014  . Acid reflux 08/02/2014  . BP (high blood pressure) 08/02/2014  . HLD (hyperlipidemia) 08/02/2014  . Incontinence 08/02/2014  . Neuropathy ([place]) 08/02/2014  . Drug noncompliance 08/02/2014  . Adult BMI 30+ 08/02/2014  . Arthritis, degenerative 08/02/2014  . Delusional disorder ([place]) 08/02/2014  . Acute exacerbation of chronic paranoid schizophrenia ([place]) 08/02/2014  . Avitaminosis D 08/02/2014    Past Medical History  Diagnosis Date  . Hypertension   . Arthritis   . Genital herpes   . Malaise and fatigue   . Hyperlipidemia   . Mental status alteration   . Edema   . Schizophrenia ([place])   . Diabetes mellitus without complication [facility])     Patient takes Metformin  . GERD (gastroesophageal reflux disease)     Social History   Social History  . Marital Status: Single    Spouse Name: N/A  . Number of Children: N/A  . Years of Education: N/A   Occupational History  . Not on file.   Social History Main Topics  . Smoking status: Current Some Day Smoker -- 0.25 packs/day    Types:  Cigarettes  . Smokeless tobacco: Never Used  . Alcohol Use: No  . Drug Use: No  . Sexual Activity: Not on file   Other Topics Concern  . Not on file   Social History Narrative    Allergies  Allergen Reactions  . Cephalexin Itching  . Peroxide [Hydrogen Peroxide] Swelling  . Shellfish Allergy Nausea And Vomiting    Review of Systems  Constitutional: Negative.   HENT: Negative.   Eyes: Negative.   Respiratory: Negative.   Cardiovascular: Negative.   Gastrointestinal: Negative.   Genitourinary: Negative.   Musculoskeletal: Positive for back pain.  Skin: Negative.   Neurological: Negative.   Endo/Heme/Allergies: Negative.   Psychiatric/Behavioral: Negative.     Immunization History  Administered Date(s) Administered  . Td 11/10/2000  Objective:  BP 112/72 mmHg  Pulse 104  Temp(Src) 97.5 F (36.4 C) (Oral)  Resp 18  Wt 320 lb (145.151 kg)  SpO2 95%  Physical Exam  Constitutional: She is oriented to person, place, and time and well-developed, well-nourished, and in no distress.  Obese black female, mildly unkempt. This is normal appearance for her with her schizophrenia.  HENT:  Head: Normocephalic and atraumatic.  Right Ear: External ear normal.  Left Ear: External ear normal.  Nose: Nose normal.  Eyes: Conjunctivae and EOM are normal. Pupils are equal, round, and reactive to light.  Neck: Normal range of motion. Neck supple.  Cardiovascular: Normal rate, regular rhythm, normal heart sounds and intact distal pulses.   Pulmonary/Chest: Effort normal and breath sounds normal.  Abdominal: Soft. Bowel sounds are normal.  Very mild left lower quadrant tenderness without guarding rebound or mass effect. His is actually a chronic finding.  Musculoskeletal: Normal range of motion.  Neurological: She is alert and oriented to person, place, and time. She has normal reflexes. Gait normal. GCS score is 15.  Skin: Skin is warm and dry.  Psychiatric: Mood, memory, affect  and judgment normal.    Lab Results  Component Value Date   WBC 14.1* 01/24/2015   HGB 12.5 05/06/2014   HCT 38.7 01/24/2015   PLT 257 10/09/2014   GLUCOSE 268* 01/24/2015   CHOL 235* 08/30/2012   TRIG 131 08/30/2012   HDL 57 08/30/2012   LDLCALC 152* 08/30/2012   TSH 4.19 05/06/2014   HGBA1C 8.3 02/10/2015    CMP     Component Value Date/Time   NA 136 01/24/2015 1212   NA 134* 03/16/2013 1256   K 4.0 01/24/2015 1212   K 4.0 03/16/2013 1256   CL 91* 01/24/2015 1212   CL 99 03/16/2013 1256   CO2 22 01/24/2015 1212   CO2 29 03/16/2013 1256   GLUCOSE 268* 01/24/2015 1212   GLUCOSE 145* 03/16/2013 1256   BUN 8 01/24/2015 1212   BUN 14 03/16/2013 1256   CREATININE 0.98 01/24/2015 1212   CREATININE 0.8 05/06/2014   CREATININE 0.84 03/16/2013 1256   CALCIUM 10.0 01/24/2015 1212   CALCIUM 8.8 03/16/2013 1256   PROT 7.0 01/24/2015 1212   PROT 7.2 08/11/2012 1652   ALBUMIN 4.4 01/24/2015 1212   ALBUMIN 3.8 08/11/2012 1652   AST 19 01/24/2015 1212   AST 19 08/11/2012 1652   ALT 21 01/24/2015 1212   ALT 19 08/11/2012 1652   ALKPHOS 93 01/24/2015 1212   ALKPHOS 82 08/11/2012 1652   BILITOT 0.4 01/24/2015 1212   BILITOT 0.6 08/11/2012 1652   GFRNONAA 62 01/24/2015 1212   GFRNONAA >60 03/16/2013 1256   GFRAA 72 01/24/2015 1212   GFRAA >60 03/16/2013 1256    Assessment and Plan :  1. Generalized abdominal pain - CBC with Differential/Platelet - Sedimentation rate Patient has had a complete workup for this several times in the past few years. His is included to GI and surgical evaluation and recent ultrasound and CT. This could be ischemic but this is highly doubtful. 2. Morbid obesity 3. Schizophrenia 4. Type 2 diabetes  Patient was seen and examined by Dr. Miguel Aschoff, and noted scribed by Webb Laws, [place] MD [facility] 02/11/2015 3:00 PM

## 2015-02-12 LAB — CBC WITH DIFFERENTIAL/PLATELET
BASOS ABS: 0 10*3/uL (ref 0.0–0.2)
BASOS: 0 %
EOS (ABSOLUTE): 0.5 10*3/uL — AB (ref 0.0–0.4)
Eos: 5 %
Hematocrit: 40.7 % (ref 34.0–46.6)
Hemoglobin: 12.8 g/dL (ref 11.1–15.9)
IMMATURE GRANS (ABS): 0 10*3/uL (ref 0.0–0.1)
Immature Granulocytes: 0 %
LYMPHS ABS: 2.1 10*3/uL (ref 0.7–3.1)
LYMPHS: 22 %
MCH: 22.4 pg — AB (ref 26.6–33.0)
MCHC: 31.4 g/dL — AB (ref 31.5–35.7)
MCV: 71 fL — AB (ref 79–97)
Monocytes Absolute: 0.8 10*3/uL (ref 0.1–0.9)
Monocytes: 8 %
NEUTROS ABS: 6.4 10*3/uL (ref 1.4–7.0)
Neutrophils: 65 %
PLATELETS: 267 10*3/uL (ref 150–379)
RBC: 5.71 x10E6/uL — ABNORMAL HIGH (ref 3.77–5.28)
RDW: 18.3 % — ABNORMAL HIGH (ref 12.3–15.4)
WBC: 9.9 10*3/uL (ref 3.4–10.8)

## 2015-02-12 LAB — SEDIMENTATION RATE: Sed Rate: 26 mm/hr (ref 0–40)

## 2015-03-06 ENCOUNTER — Telehealth: Payer: Self-pay

## 2015-03-06 NOTE — Telephone Encounter (Signed)
Victorino Dike called from [facility] and just wanted to make sure that patient should be on both Lasix and Maxzide? They wanted to double check since they were both with fluid pill. Please review. When calling back to the pharmacy ask for JB. Thanks-aa

## 2015-03-12 NOTE — Telephone Encounter (Signed)
ok 

## 2015-03-12 NOTE — Telephone Encounter (Signed)
lmtcb Jb was not in yet, Dr. Sullivan Lone said yes for patient to have both medications due to patient's health issues and size, may need to make a change in the future due to possible kidney damage-aa

## 2015-03-13 ENCOUNTER — Other Ambulatory Visit: Payer: Self-pay

## 2015-03-13 DIAGNOSIS — J45909 Unspecified asthma, uncomplicated: Secondary | ICD-10-CM

## 2015-03-13 MED ORDER — ALBUTEROL SULFATE HFA 108 (90 BASE) MCG/ACT IN AERS: 1.0000 | INHALATION_SPRAY | Freq: Four times a day (QID) | RESPIRATORY_TRACT | Status: DC | PRN

## 2015-03-13 NOTE — Telephone Encounter (Signed)
Christy [name] was advised yesterday by Janeece Fitting

## 2015-04-02 ENCOUNTER — Telehealth: Payer: Self-pay

## 2015-04-02 DIAGNOSIS — B3731 Acute candidiasis of vulva and vagina: Secondary | ICD-10-CM

## 2015-04-02 DIAGNOSIS — B373 Candidiasis of vulva and vagina: Secondary | ICD-10-CM

## 2015-04-02 MED ORDER — TERCONAZOLE 0.8 % VA CREA: 1.0000 | TOPICAL_CREAM | Freq: Every day | VAGINAL | Status: DC

## 2015-04-02 NOTE — Telephone Encounter (Signed)
Patient wants to know if we can fill Terazol cream for her. She has itching and some soreness and uses this at times like this so it doesn't get worse. Last fill was in January by Dr. Sullivan LoneGilbert. She has appt to come in on Friday for other issues because she could not get here earlier with her ride-aa

## 2015-04-02 NOTE — Telephone Encounter (Signed)
[facility]

## 2015-04-04 ENCOUNTER — Ambulatory Visit: Payer: Self-pay | Admitting: Physician Assistant

## 2015-04-10 ENCOUNTER — Ambulatory Visit: Payer: Medicaid Other | Admitting: Family Medicine

## 2015-04-15 ENCOUNTER — Ambulatory Visit (INDEPENDENT_AMBULATORY_CARE_PROVIDER_SITE_OTHER): Payer: Medicaid Other | Admitting: Family Medicine

## 2015-04-15 ENCOUNTER — Encounter: Payer: Self-pay | Admitting: Family Medicine

## 2015-04-15 VITALS — BP 126/72 | HR 84 | Temp 97.5°F | Resp 18 | Wt 312.0 lb

## 2015-04-15 DIAGNOSIS — Z720 Tobacco use: Secondary | ICD-10-CM

## 2015-04-15 DIAGNOSIS — R197 Diarrhea, unspecified: Secondary | ICD-10-CM

## 2015-04-15 DIAGNOSIS — R1110 Vomiting, unspecified: Secondary | ICD-10-CM

## 2015-04-15 DIAGNOSIS — E785 Hyperlipidemia, unspecified: Secondary | ICD-10-CM | POA: Diagnosis not present

## 2015-04-15 DIAGNOSIS — L298 Other pruritus: Secondary | ICD-10-CM

## 2015-04-15 DIAGNOSIS — E1143 Type 2 diabetes mellitus with diabetic autonomic (poly)neuropathy: Secondary | ICD-10-CM | POA: Diagnosis not present

## 2015-04-15 DIAGNOSIS — F200 Paranoid schizophrenia: Secondary | ICD-10-CM | POA: Diagnosis not present

## 2015-04-15 DIAGNOSIS — R1084 Generalized abdominal pain: Secondary | ICD-10-CM | POA: Diagnosis not present

## 2015-04-15 DIAGNOSIS — N898 Other specified noninflammatory disorders of vagina: Secondary | ICD-10-CM

## 2015-04-15 DIAGNOSIS — B373 Candidiasis of vulva and vagina: Secondary | ICD-10-CM | POA: Diagnosis not present

## 2015-04-15 DIAGNOSIS — B3731 Acute candidiasis of vulva and vagina: Secondary | ICD-10-CM

## 2015-04-15 LAB — POCT WET PREP (WET MOUNT)
Clue Cells Wet Prep Whiff POC: NEGATIVE
WBC, Wet Prep HPF POC: NEGATIVE

## 2015-04-15 LAB — IFOBT (OCCULT BLOOD): IFOBT: NEGATIVE

## 2015-04-15 MED ORDER — TERCONAZOLE 0.8 % VA CREA: 1.0000 | TOPICAL_CREAM | Freq: Every day | VAGINAL | Status: DC

## 2015-04-15 NOTE — Progress Notes (Signed)
Patient ID: Christy [name], female   DOB: Feb 27, 1955, 60 y.o.   MRN: [id number]    Subjective:  HPI  Patient is still having abdominal pain-RLQ, vomiting, indigestion, regurgitation, diarrhea. She is vomiting after eating soft or hard solids or fluids. She lost 8 lbs since last visit in February 11, 2015. Patient has had these symptoms for a few months now. She has not seen gyn for lower abdominal pain, she was not able to get colonoscopy due to not having someone to drive her and would like to get this set up again when she has assistance to help her.  Prior to Admission medications   Medication Sig Start Date End Date Taking? Authorizing Provider  albuterol (PROVENTIL HFA;VENTOLIN HFA) 108 (90 Base) MCG/ACT inhaler Inhale 1-2 puffs into the lungs every 6 (six) hours as needed for wheezing or shortness of breath. 03/13/15  Yes Richard Hulen Shouts., MD  aspirin (ASPIRIN EC) 81 MG EC tablet Take 81 mg by mouth daily. Swallow whole.   Yes Historical Provider, MD  benztropine (COGENTIN) 0.5 MG tablet Take 0.5 mg by mouth 2 (two) times daily. One tab in the morning and 2 tabs at bedtime   Yes Historical Provider, MD  docusate sodium (COLACE) 100 MG capsule Take 100 mg by mouth 2 (two) times daily.   Yes Historical Provider, MD  FluPHENAZine HCl (PROLIXIN PO) Take 25 mg by mouth.   Yes Historical Provider, MD  fluticasone (FLONASE) 50 MCG/ACT nasal spray Place into the nose. 02/23/13  Yes Historical Provider, MD  furosemide (LASIX) 40 MG tablet Take 1 tablet (40 mg total) by mouth daily. 11/18/14  Yes Richard Hulen Shouts., MD  gabapentin (NEURONTIN) 300 MG capsule Take 300 mg by mouth 3 (three) times daily.   Yes Historical Provider, MD  glimepiride (AMARYL) 4 MG tablet Take 1 tablet (4 mg total) by mouth daily with breakfast. 10/15/14  Yes Maple Hudson., MD  glucose blood (ACCU-CHEK AVIVA PLUS) test strip  01/11/14  Yes Historical Provider, MD  hydrocortisone 2.5 % lotion HYDROCORTISONE, 2.5%  (External Lotion)  1 Lotion Lotion apply to ear eczema twice daily for 0 days  Quantity: 4;  Refills: 1   Ordered :23-Feb-2013  Janey Greaser ;  Started 23-Feb-2013 Active Comments: substitute for Desonide which was not covered by insurance. 02/23/13  Yes Historical Provider, MD  Insulin Glargine (LANTUS SOLOSTAR) 100 UNIT/ML Solostar Pen Inject into the skin. 05/02/14  Yes Historical Provider, MD  loratadine (CLARITIN) 10 MG tablet Take 10 mg by mouth daily.   Yes Historical Provider, MD  lubiprostone (AMITIZA) 24 MCG capsule Take 1 capsule (24 mcg total) by mouth 2 (two) times daily with a meal. 09/25/14  Yes Maple Hudson., MD  meloxicam (MOBIC) 15 MG tablet TAKE 1 TABLET BY MOUTH DAILY AS NEEDED FOR PAIN. 08/22/14  Yes Maple Hudson., MD  metFORMIN (GLUCOPHAGE) 1000 MG tablet Take 1,000 mg by mouth 1 day or 1 dose. With supper   Yes Historical Provider, MD  nystatin cream (MYCOSTATIN) NYSTATIN, 100000 UNIT/GM (External Cream)  1 (one) Cream Cream apply small amount twice daily for 0 days  Quantity: 1;  Refills: 0   Ordered :22-May-2014  Wallace Keller ;  Started 22-May-2014 Active 05/22/14  Yes Historical Provider, MD  omeprazole (PRILOSEC) 20 MG capsule Take 20 mg by mouth daily.   Yes Historical Provider, MD  ondansetron (ZOFRAN) 4 MG tablet Take 1 tablet (4 mg total) by mouth every 8 (eight)  hours as needed for nausea or vomiting. 01/24/15  Yes Alessandra BevelsJennifer M Burnette, PA-C  pantoprazole (PROTONIX) 40 MG tablet Take 40 mg by mouth daily.   Yes Historical Provider, MD  potassium chloride SA (K-DUR,KLOR-CON) 20 MEQ tablet Take 20 mEq by mouth 2 (two) times daily.   Yes Historical Provider, MD  pregabalin (LYRICA) 50 MG capsule Take 50 mg by mouth 3 (three) times daily.   Yes Historical Provider, MD  ranitidine (ZANTAC) 150 MG tablet Take 1 tablet (150 mg total) by mouth 2 (two) times daily. 10/15/14  Yes Richard Hulen ShoutsL Gilbert Jr., MD  simvastatin (ZOCOR) 10 MG tablet Take 1 tablet (10 mg  total) by mouth daily. 11/18/14  Yes Richard Hulen ShoutsL Gilbert Jr., MD  terconazole (TERAZOL 3) 0.8 % vaginal cream Place 1 applicator vaginally at bedtime. 04/02/15  Yes Alessandra BevelsJennifer M Burnette, PA-C  triamterene-hydrochlorothiazide (MAXZIDE-25) 37.5-25 MG tablet Take 1 tablet by mouth daily. 01/08/15  Yes Richard Hulen ShoutsL Gilbert Jr., MD  valACYclovir (VALTREX) 1000 MG tablet Take 1 tablet (1,000 mg total) by mouth 2 (two) times daily. 10/15/14  Yes Richard Hulen ShoutsL Gilbert Jr., MD    Patient Active Problem List   Diagnosis Date Noted  . Allergic rhinitis 08/02/2014  . Arthropathia 08/02/2014  . CCF (congestive cardiac failure) (HCC) 08/02/2014  . Current tobacco use 08/02/2014  . Diabetic neuropathy (HCC) 08/02/2014  . Genital herpes 08/02/2014  . Acid reflux 08/02/2014  . BP (high blood pressure) 08/02/2014  . HLD (hyperlipidemia) 08/02/2014  . Incontinence 08/02/2014  . Neuropathy (HCC) 08/02/2014  . Drug noncompliance 08/02/2014  . Adult BMI 30+ 08/02/2014  . Arthritis, degenerative 08/02/2014  . Delusional disorder (HCC) 08/02/2014  . Acute exacerbation of chronic paranoid schizophrenia (HCC) 08/02/2014  . Avitaminosis D 08/02/2014    Past Medical History  Diagnosis Date  . Hypertension   . Arthritis   . Genital herpes   . Malaise and fatigue   . Hyperlipidemia   . Mental status alteration   . Edema   . Schizophrenia (HCC)   . Diabetes mellitus without complication [facility](HCC)     Patient takes Metformin  . GERD (gastroesophageal reflux disease)     Social History   Social History  . Marital Status: Single    Spouse Name: N/A  . Number of Children: N/A  . Years of Education: N/A   Occupational History  . Not on file.   Social History Main Topics  . Smoking status: Current Some Day Smoker -- 0.25 packs/day    Types: Cigarettes  . Smokeless tobacco: Never Used  . Alcohol Use: No  . Drug Use: No  . Sexual Activity: Not on file   Other Topics Concern  . Not on file   Social History  Narrative    Allergies  Allergen Reactions  . Cephalexin Itching  . Peroxide [Hydrogen Peroxide] Swelling  . Shellfish Allergy Nausea And Vomiting    Review of Systems  Constitutional: Positive for malaise/fatigue. Negative for fever.       Severe sweats  Respiratory: Positive for shortness of breath.   Cardiovascular: Negative.   Gastrointestinal: Positive for vomiting, abdominal pain and diarrhea.  Musculoskeletal: Positive for myalgias, back pain and joint pain.  Neurological: Positive for weakness.    Immunization History  Administered Date(s) Administered  . Td 11/10/2000   Objective:  BP 126/72 mmHg  Pulse 84  Temp(Src) 97.5 F (36.4 C)  Resp 18  Wt 312 lb (141.522 kg)  SpO2 98%  Physical Exam  Constitutional:  She is oriented to person, place, and time and well-developed, well-nourished, and in no distress.  HENT:  Head: Normocephalic and atraumatic.  Right Ear: External ear normal.  Left Ear: External ear normal.  Nose: Nose normal.  Cardiovascular: Normal rate, regular rhythm, normal heart sounds and intact distal pulses.   No murmur heard. Pulmonary/Chest: Effort normal and breath sounds normal. No respiratory distress.  Abdominal: Soft. She exhibits no mass. There is no tenderness. There is no rebound and no guarding.  Genitourinary: Vagina normal. Guaiac negative stool. No vaginal discharge found.  Neurological: She is alert and oriented to person, place, and time.  Skin: Skin is warm and dry.  Psychiatric: Mood and affect normal.    Lab Results  Component Value Date   WBC 9.9 02/11/2015   HGB 12.5 05/06/2014   HCT 40.7 02/11/2015   PLT 267 02/11/2015   GLUCOSE 268* 01/24/2015   CHOL 235* 08/30/2012   TRIG 131 08/30/2012   HDL 57 08/30/2012   LDLCALC 152* 08/30/2012   TSH 4.19 05/06/2014   HGBA1C 8.3 02/10/2015    CMP     Component Value Date/Time   NA 136 01/24/2015 1212   NA 134* 03/16/2013 1256   K 4.0 01/24/2015 1212   K 4.0  03/16/2013 1256   CL 91* 01/24/2015 1212   CL 99 03/16/2013 1256   CO2 22 01/24/2015 1212   CO2 29 03/16/2013 1256   GLUCOSE 268* 01/24/2015 1212   GLUCOSE 145* 03/16/2013 1256   BUN 8 01/24/2015 1212   BUN 14 03/16/2013 1256   CREATININE 0.98 01/24/2015 1212   CREATININE 0.8 05/06/2014   CREATININE 0.84 03/16/2013 1256   CALCIUM 10.0 01/24/2015 1212   CALCIUM 8.8 03/16/2013 1256   PROT 7.0 01/24/2015 1212   PROT 7.2 08/11/2012 1652   ALBUMIN 4.4 01/24/2015 1212   ALBUMIN 3.8 08/11/2012 1652   AST 19 01/24/2015 1212   AST 19 08/11/2012 1652   ALT 21 01/24/2015 1212   ALT 19 08/11/2012 1652   ALKPHOS 93 01/24/2015 1212   ALKPHOS 82 08/11/2012 1652   BILITOT 0.4 01/24/2015 1212   BILITOT 0.6 08/11/2012 1652   GFRNONAA 62 01/24/2015 1212   GFRNONAA >60 03/16/2013 1256   GFRAA 72 01/24/2015 1212   GFRAA >60 03/16/2013 1256    Assessment and Plan :  1. Generalized abdominal pain Unchanged. Will refer to gyn and refer back to GI-needs colonoscopy and maybe endoscopy. Will check labs also. - Ambulatory referral to Gastroenterology - Ambulatory referral to Gynecology - CBC with Differential/Platelet - Comprehensive metabolic panel - TSH - H. pylori antibody, IgG - Lipase - IFOBT POC (occult bld, rslt in office)-negative  2. Intractable vomiting with nausea, vomiting of unspecified type - Ambulatory referral to Gastroenterology - Comprehensive metabolic panel - TSH - H. pylori antibody, IgG These symptoms are very vague. I think the patient is okay but due to mental illness need to follow through on her issues. More than 50% time spent in counseling for these things 3. Vaginal itching Wet prep in the office did show some yeast cells. Refilled vaginal cream. Advised patient to have protected sex with her partner or not at all due to possibility of infections. - terconazole (TERAZOL 3) 0.8 % vaginal cream; Place 1 applicator vaginally at bedtime.  Dispense: 20 g; Refill: 1 -  POCT Wet Prep [employer])  4. Diarrhea, unspecified type - Ambulatory referral to Gastroenterology - IFOBT POC (occult bld, rslt in office)-negative  5. Diabetic autonomic  neuropathy associated with type 2 diabetes mellitus (HCC) Stop Metformin-this could be contributing to diarrhea. Will check A1C on the next visit and discuss further plan of care. - Lipid Panel With LDL/HDL Ratio - TSH  6. Acute exacerbation of chronic paranoid schizophrenia [facility]) Advised patient and caretaker today how important it is for patient to take her medications daily as directed.  7. HLD (hyperlipidemia) - Lipid Panel With LDL/HDL Ratio  8. Current tobacco use Needs to quit. Also advised patient to try and loose weight which can help some of her health issues.  GERD-advised patient she should not be taking Omeprazole and PRotonix (per our list), stop Omeprazole. Advised patient and caregiver to call us back if the medication list we have does not match what medications she has at home that she has been taking.  Patient was seen and examined by Dr. Bosie Clos and note was scribed by Samara Deist, RMA.   9. Vaginal candida - terconazole (TERAZOL 3) 0.8 % vaginal cream; Place 1 applicator vaginally at bedtime.  Dispense: 20 g; Refill: 1   Julieanne Manson MD [facility] 04/15/2015 11:26 AM

## 2015-04-16 ENCOUNTER — Telehealth: Payer: Self-pay | Admitting: Family Medicine

## 2015-04-16 LAB — COMPREHENSIVE METABOLIC PANEL
ALBUMIN: 4.4 g/dL (ref 3.6–4.8)
ALT: 22 IU/L (ref 0–32)
AST: 20 IU/L (ref 0–40)
Albumin/Globulin Ratio: 1.8 (ref 1.2–2.2)
Alkaline Phosphatase: 87 IU/L (ref 39–117)
BUN / CREAT RATIO: 8 — AB (ref 11–26)
BUN: 6 mg/dL — AB (ref 8–27)
Bilirubin Total: 0.6 mg/dL (ref 0.0–1.2)
CO2: 27 mmol/L (ref 18–29)
CREATININE: 0.73 mg/dL (ref 0.57–1.00)
Calcium: 9.9 mg/dL (ref 8.7–10.3)
Chloride: 93 mmol/L — ABNORMAL LOW (ref 96–106)
GFR calc non Af Amer: 89 mL/min/{1.73_m2} (ref >59)
GFR, EST AFRICAN AMERICAN: 103 mL/min/{1.73_m2} (ref >59)
GLUCOSE: 223 mg/dL — AB (ref 65–99)
Globulin, Total: 2.4 g/dL (ref 1.5–4.5)
Potassium: 4.4 mmol/L (ref 3.5–5.2)
Sodium: 139 mmol/L (ref 134–144)
TOTAL PROTEIN: 6.8 g/dL (ref 6.0–8.5)

## 2015-04-16 LAB — LIPID PANEL WITH LDL/HDL RATIO
Cholesterol, Total: 182 mg/dL (ref 100–199)
HDL: 46 mg/dL (ref >39)
LDL CALC: 93 mg/dL (ref 0–99)
LDl/HDL Ratio: 2 ratio units (ref 0.0–3.2)
Triglycerides: 215 mg/dL — ABNORMAL HIGH (ref 0–149)
VLDL CHOLESTEROL CAL: 43 mg/dL — AB (ref 5–40)

## 2015-04-16 LAB — CBC WITH DIFFERENTIAL/PLATELET
BASOS ABS: 0 10*3/uL (ref 0.0–0.2)
Basos: 0 %
EOS (ABSOLUTE): 0.6 10*3/uL — ABNORMAL HIGH (ref 0.0–0.4)
Eos: 7 %
HEMOGLOBIN: 12.7 g/dL (ref 11.1–15.9)
Hematocrit: 41 % (ref 34.0–46.6)
IMMATURE GRANS (ABS): 0 10*3/uL (ref 0.0–0.1)
Immature Granulocytes: 0 %
LYMPHS: 22 %
Lymphocytes Absolute: 2 10*3/uL (ref 0.7–3.1)
MCH: 22.3 pg — AB (ref 26.6–33.0)
MCHC: 31 g/dL — ABNORMAL LOW (ref 31.5–35.7)
MCV: 72 fL — ABNORMAL LOW (ref 79–97)
MONOCYTES: 14 %
Monocytes Absolute: 1.2 10*3/uL — ABNORMAL HIGH (ref 0.1–0.9)
NEUTROS ABS: 5.1 10*3/uL (ref 1.4–7.0)
Neutrophils: 57 %
Platelets: 268 10*3/uL (ref 150–379)
RBC: 5.7 x10E6/uL — AB (ref 3.77–5.28)
RDW: 17.9 % — ABNORMAL HIGH (ref 12.3–15.4)
WBC: 9 10*3/uL (ref 3.4–10.8)

## 2015-04-16 LAB — LIPASE: LIPASE: 13 U/L (ref 0–59)

## 2015-04-16 LAB — TSH: TSH: 4.68 u[IU]/mL — AB (ref 0.450–4.500)

## 2015-04-16 LAB — H. PYLORI ANTIBODY, IGG

## 2015-04-16 NOTE — Telephone Encounter (Signed)
Per Fransico MichaelJerlene (Caregiver) pt was given sheet when she left office with medications that she is supposed to be on.She states that several of the medications listed pt is not currently taking

## 2015-04-16 NOTE — Telephone Encounter (Signed)
Called patient to verify medication list. Unable to leave a voicemail. Will try again later.

## 2015-04-17 NOTE — Telephone Encounter (Signed)
Spoke with Jerlene-caregiver, we went over medications that patient has at home and what we have in our chart, these are the medications that we have had on her list for at least a year but she is not taking-Zofran, Meloxicam, Lyrica,Gabapentin and Lantus pen 10 units daily. Does she need to re start any of these?-aa  Also need to advised patient labs were ok-aa

## 2015-04-21 NOTE — Telephone Encounter (Signed)
[facility]   Please call her back [phone]

## 2015-04-21 NOTE — Telephone Encounter (Signed)
Please review-aa 

## 2015-04-21 NOTE — Telephone Encounter (Signed)
Please let me know about her medication and what she needs to restart from the list below-aa

## 2015-04-24 ENCOUNTER — Encounter: Payer: Self-pay | Admitting: Obstetrics and Gynecology

## 2015-04-24 ENCOUNTER — Ambulatory Visit (INDEPENDENT_AMBULATORY_CARE_PROVIDER_SITE_OTHER): Payer: Medicaid Other | Admitting: Obstetrics and Gynecology

## 2015-04-24 VITALS — BP 154/93 | HR 95 | Ht 69.5 in | Wt 319.4 lb

## 2015-04-24 DIAGNOSIS — Z90710 Acquired absence of both cervix and uterus: Secondary | ICD-10-CM | POA: Diagnosis not present

## 2015-04-24 DIAGNOSIS — R1030 Lower abdominal pain, unspecified: Secondary | ICD-10-CM

## 2015-04-24 DIAGNOSIS — Z6841 Body Mass Index (BMI) 40.0 and over, adult: Secondary | ICD-10-CM | POA: Diagnosis not present

## 2015-04-24 LAB — POCT URINALYSIS DIPSTICK
GLUCOSE UA: NEGATIVE
KETONES UA: NEGATIVE
Leukocytes, UA: NEGATIVE
Nitrite, UA: NEGATIVE
PH UA: 6.5
Protein, UA: NEGATIVE
RBC UA: NEGATIVE
Spec Grav, UA: <=1.005
Urobilinogen, UA: 0.2

## 2015-04-24 NOTE — Progress Notes (Signed)
GYN ENCOUNTER NOTE  Subjective:       Christy [name] is a 60 y.o. G0P0000 female is here for gynecologic evaluation of the following issues:  1.  Chronic right lower quadrant pain  60 year old female status post TAH(uncertain about ovaries), status post cholecystectomy, presents for evaluation of long history of right lower quadrant abdominal pain.  She  states that the pain has been present for several months. Character is sharp with waxing and waning episodes. It hurts both day and night. Alleviating factors include lying down. Exacerbating factors include movement, activity, bowel movements and urination. Patient is status post abdominal hysterectomy by Dr. Vernell Leep for uterine fibroids and menorrhagia; she does not know if she had her ovaries removed. She has never been on hormone replacement therapy. Currently she is experiencing some vasomotor symptoms.   CT scan of the abdomen and pelvis with contrast on 10/02/2014 did not demonstrate any adnexal masses; ovaries were not visualized. Ultrasound of pelvis on 09/19/2014 revealed no abnormalities and again the ovaries were not visualized.    Gynecologic History No LMP recorded. Patient has had a hysterectomy. Contraception: status post hysterectomy  Obstetric History OB History  Gravida Para Term Preterm AB SAB TAB Ectopic Multiple Living         Past Medical History  Diagnosis Date  . Hypertension   . Arthritis   . Malaise and fatigue   . Hyperlipidemia   . Mental status alteration   . Edema   . Schizophrenia (HCC)   . Diabetes mellitus without complication [facility])     Patient takes Metformin  . GERD (gastroesophageal reflux disease)   . Genital herpes     Past Surgical History  Procedure Laterality Date  . Tonsillectomy    . Foot surgery Right   . Ganglion cyst excision    . Cholecystectomy    . Upper gi endoscopy  07/31/13    mild chronic inflammation and reactive gastropathy-no need for  another EGD repeat  . Abdominal hysterectomy      due to fibroids in 1997    Current Outpatient Prescriptions on File Prior to Visit  Medication Sig Dispense Refill  . albuterol (PROVENTIL HFA;VENTOLIN HFA) 108 (90 Base) MCG/ACT inhaler Inhale 1-2 puffs into the lungs every 6 (six) hours as needed for wheezing or shortness of breath. 8 Inhaler 6  . aspirin (ASPIRIN EC) 81 MG EC tablet Take 81 mg by mouth daily. Swallow whole.    . benztropine (COGENTIN) 0.5 MG tablet Take 0.5 mg by mouth 2 (two) times daily. One tab in the morning and 2 tabs at bedtime    . docusate sodium (COLACE) 100 MG capsule Take 100 mg by mouth 2 (two) times daily.    [name] etodolac (LODINE) 500 MG tablet Take 500 mg by mouth 2 (two) times daily.    . FluPHENAZine HCl (PROLIXIN PO) Take 25 mg by mouth. injection    . furosemide (LASIX) 40 MG tablet Take 1 tablet (40 mg total) by mouth daily. 30 tablet 5  . gabapentin (NEURONTIN) 300 MG capsule Take 300 mg by mouth 3 (three) times daily.    [name] glimepiride (AMARYL) 4 MG tablet Take 1 tablet (4 mg total) by mouth daily with breakfast. 30 tablet 12  . glucose blood (ACCU-CHEK AVIVA PLUS) test strip     . Insulin Glargine (LANTUS SOLOSTAR) 100 UNIT/ML Solostar Pen Inject into the skin. Reported on 04/17/2015    .  loratadine (CLARITIN) 10 MG tablet Take 10 mg by mouth daily.    . meloxicam (MOBIC) 15 MG tablet TAKE 1 TABLET BY MOUTH DAILY AS NEEDED FOR PAIN. 30 tablet 3  . ondansetron (ZOFRAN) 4 MG tablet Take 1 tablet (4 mg total) by mouth every 8 (eight) hours as needed for nausea or vomiting. 20 tablet 0  . pantoprazole (PROTONIX) 40 MG tablet Take 40 mg by mouth daily.    . potassium chloride SA (K-DUR,KLOR-CON) 20 MEQ tablet Take 20 mEq by mouth 2 (two) times daily.    . pregabalin (LYRICA) 50 MG capsule Take 50 mg by mouth 3 (three) times daily.    . ranitidine (ZANTAC) 150 MG tablet Take 1 tablet (150 mg total) by mouth 2 (two) times daily. 60 tablet 12  . simvastatin  (ZOCOR) 10 MG tablet Take 1 tablet (10 mg total) by mouth daily. 30 tablet 12  . terconazole (TERAZOL 3) 0.8 % vaginal cream Place 1 applicator vaginally at bedtime. 20 g 1  . triamterene-hydrochlorothiazide (MAXZIDE-25) 37.5-25 MG tablet Take 1 tablet by mouth daily. 30 tablet 5  . valACYclovir (VALTREX) 1000 MG tablet Take 1 tablet (1,000 mg total) by mouth 2 (two) times daily. 30 tablet 12   No current facility-administered medications on file prior to visit.    Allergies  Allergen Reactions  . Cephalexin Itching  . Peroxide [Hydrogen Peroxide] Swelling  . Shellfish Allergy Nausea And Vomiting    Social History   Social History  . Marital Status: Single    Spouse Name: N/A  . Number of Children: N/A  . Years of Education: N/A   Occupational History  . Not on file.   Social History Main Topics  . Smoking status: Current Some Day Smoker -- 0.25 packs/day    Types: Cigarettes  . Smokeless tobacco: Never Used  . Alcohol Use: No  . Drug Use: No  . Sexual Activity: Yes   Other Topics Concern  . Not on file   Social History Narrative    Family History  Problem Relation Age of Onset  . Diabetes Mother   . Pulmonary embolism Mother   . Diabetes Father     The following portions of the patient's history were reviewed and updated as appropriate: allergies, current medications, past family history, past medical history, past social history, past surgical history and problem list.  Review of Systems Review of Systems - General ROS: negative for - fatigue, chills, fever, night sweats. POSITIVE-vasomotor symptoms (hot flashes) Hematological and Lymphatic ROS: negative for - bleeding problems or swollen lymph nodes Gastrointestinal ROS: negative for - abdominal pain, blood in stools, change in bowel habits and nausea/vomiting Musculoskeletal ROS: negative for - joint pain, muscle pain or muscular weakness Genito-Urinary ROS: negative for - negative for vaginal discharge,  vaginal odor, vaginal bleeding. POSITIVE-right lower quadrant pain  Objective:   BP 154/93 mmHg  Pulse 95  Ht 5' 9.5" (1.765 m)  Wt 319 lb 6.4 oz (144.879 kg)  BMI 46.51 kg/m2 CONSTITUTIONAL: Well-developed, well-nourished female in no acute distress.  HENT:  Normocephalic, atraumatic.  NECK: Normal range of motion, supple, no masses.  Normal thyroid.  SKIN: Skin is warm and dry. No rash noted. Not diaphoretic. No erythema. No pallor. NEUROLGIC: Alert and oriented to person, place, and time. PSYCHIATRIC: Normal mood and affect. Normal behavior. Normal judgment and thought content. CARDIOVASCULAR:Not Examined RESPIRATORY: Not Examined BREASTS: Not Examined ABDOMEN: Soft, non distended; Non tender.  No Organomegaly.; large pannus; no peritoneal signs;  no hernias PELVIC:  External Genitalia: Normal  BUS: Normal  Vagina: Normal; vaginal vault intact; no masses and nontender  Cervix: surgically absent  Uterus: surgically absent  Adnexa: nonpalpable and nontender  RV: Normal external exam; normal sphincter tone; no rectal masses  Bladder: Nontender MUSCULOSKELETAL: Normal range of motion. No tenderness.  No cyanosis, clubbing, or edema.     Assessment:   1. Lower abdominal pain - POCT urinalysis dipstick - Urine culture - US Pelvis Complete; Future - US Transvaginal Non-OB; Future   2. Status post TAH  3. Morbid obesity  Plan:   1. Pelvic ultrasound 2. Patient given reassurance that her chronic pain is not likely due to gynecologic origin. 3. Follow-up GI consultation 4. Patient will be notified by phone ultrasound results  A total of 30 minutes were spent face-to-face with the patient during the encounter with greater than 50% dealing with counseling and coordination of care.  Herold Harms, MD  Note: This dictation was prepared with Dragon dictation along with smaller phrase technology. Any transcriptional errors that result from this process are  unintentional.

## 2015-04-27 LAB — URINE CULTURE: ORGANISM ID, BACTERIA: NO GROWTH

## 2015-04-28 ENCOUNTER — Emergency Department: Payer: Medicaid Other

## 2015-04-28 ENCOUNTER — Emergency Department
Admission: EM | Admit: 2015-04-28 | Discharge: 2015-04-29 | Disposition: A | Discharge status: Home / Self Care | Payer: Medicaid Other | Attending: Emergency Medicine | Admitting: Emergency Medicine

## 2015-04-28 ENCOUNTER — Encounter: Payer: Self-pay | Admitting: Emergency Medicine

## 2015-04-28 DIAGNOSIS — I509 Heart failure, unspecified: Secondary | ICD-10-CM | POA: Diagnosis not present

## 2015-04-28 DIAGNOSIS — Z9114 Patient's other noncompliance with medication regimen: Secondary | ICD-10-CM | POA: Insufficient documentation

## 2015-04-28 DIAGNOSIS — Z8489 Family history of other specified conditions: Secondary | ICD-10-CM | POA: Insufficient documentation

## 2015-04-28 DIAGNOSIS — R739 Hyperglycemia, unspecified: Secondary | ICD-10-CM

## 2015-04-28 DIAGNOSIS — R112 Nausea with vomiting, unspecified: Secondary | ICD-10-CM

## 2015-04-28 DIAGNOSIS — Z833 Family history of diabetes mellitus: Secondary | ICD-10-CM | POA: Diagnosis not present

## 2015-04-28 DIAGNOSIS — K219 Gastro-esophageal reflux disease without esophagitis: Secondary | ICD-10-CM | POA: Diagnosis not present

## 2015-04-28 DIAGNOSIS — R109 Unspecified abdominal pain: Secondary | ICD-10-CM | POA: Diagnosis present

## 2015-04-28 DIAGNOSIS — Z720 Tobacco use: Secondary | ICD-10-CM | POA: Insufficient documentation

## 2015-04-28 DIAGNOSIS — R197 Diarrhea, unspecified: Secondary | ICD-10-CM | POA: Insufficient documentation

## 2015-04-28 DIAGNOSIS — I10 Essential (primary) hypertension: Secondary | ICD-10-CM | POA: Diagnosis not present

## 2015-04-28 DIAGNOSIS — E1140 Type 2 diabetes mellitus with diabetic neuropathy, unspecified: Secondary | ICD-10-CM | POA: Diagnosis not present

## 2015-04-28 DIAGNOSIS — E871 Hypo-osmolality and hyponatremia: Secondary | ICD-10-CM | POA: Diagnosis not present

## 2015-04-28 DIAGNOSIS — R42 Dizziness and giddiness: Secondary | ICD-10-CM

## 2015-04-28 DIAGNOSIS — E876 Hypokalemia: Secondary | ICD-10-CM

## 2015-04-28 DIAGNOSIS — E785 Hyperlipidemia, unspecified: Secondary | ICD-10-CM | POA: Diagnosis not present

## 2015-04-28 DIAGNOSIS — J9600 Acute respiratory failure, unspecified whether with hypoxia or hypercapnia: Secondary | ICD-10-CM | POA: Diagnosis present

## 2015-04-28 HISTORY — DX: Unspecified asthma, uncomplicated: J45.909

## 2015-04-28 LAB — BASIC METABOLIC PANEL
Anion gap: 15 (ref 5–15)
BUN: 10 mg/dL (ref 6–20)
CHLORIDE: 94 mmol/L — AB (ref 101–111)
CO2: 23 mmol/L (ref 22–32)
Calcium: 8.9 mg/dL (ref 8.9–10.3)
Creatinine, Ser: 0.88 mg/dL (ref 0.44–1.00)
GFR calc Af Amer: >60 mL/min (ref >60)
GFR calc non Af Amer: >60 mL/min (ref >60)
GLUCOSE: 247 mg/dL — AB (ref 65–99)
POTASSIUM: 3.4 mmol/L — AB (ref 3.5–5.1)
SODIUM: 132 mmol/L — AB (ref 135–145)

## 2015-04-28 LAB — BASIC METABOLIC PANEL WITH GFR
Anion gap: 13 (ref 5–15)
BUN: 10 mg/dL (ref 6–20)
CO2: 27 mmol/L (ref 22–32)
Calcium: 9.1 mg/dL (ref 8.9–10.3)
Chloride: 90 mmol/L — ABNORMAL LOW (ref 101–111)
Creatinine, Ser: 0.95 mg/dL (ref 0.44–1.00)
GFR calc Af Amer: >60 mL/min
GFR calc non Af Amer: >60 mL/min
Glucose, Bld: 311 mg/dL — ABNORMAL HIGH (ref 65–99)
Potassium: 2.8 mmol/L — CL (ref 3.5–5.1)
Sodium: 130 mmol/L — ABNORMAL LOW (ref 135–145)

## 2015-04-28 LAB — COMPREHENSIVE METABOLIC PANEL
ALBUMIN: 4.6 g/dL (ref 3.5–5.0)
ALT: 29 U/L (ref 14–54)
ANION GAP: 16 — AB (ref 5–15)
AST: 37 U/L (ref 15–41)
Alkaline Phosphatase: 93 U/L (ref 38–126)
BILIRUBIN TOTAL: 1.1 mg/dL (ref 0.3–1.2)
BUN: 10 mg/dL (ref 6–20)
CALCIUM: 9.9 mg/dL (ref 8.9–10.3)
CO2: 27 mmol/L (ref 22–32)
Chloride: 89 mmol/L — ABNORMAL LOW (ref 101–111)
Creatinine, Ser: 0.89 mg/dL (ref 0.44–1.00)
GFR calc non Af Amer: >60 mL/min (ref >60)
GLUCOSE: 306 mg/dL — AB (ref 65–99)
POTASSIUM: 2.5 mmol/L — AB (ref 3.5–5.1)
SODIUM: 132 mmol/L — AB (ref 135–145)
TOTAL PROTEIN: 8.3 g/dL — AB (ref 6.5–8.1)

## 2015-04-28 LAB — TROPONIN I: Troponin I: <0.03 ng/mL

## 2015-04-28 LAB — LIPASE, BLOOD: Lipase: 12 U/L (ref 11–51)

## 2015-04-28 LAB — RAPID INFLUENZA A&B ANTIGENS
Influenza A (ARMC): NEGATIVE
Influenza B (ARMC): NEGATIVE

## 2015-04-28 LAB — CBC
HEMATOCRIT: 44.2 % (ref 35.0–47.0)
HEMOGLOBIN: 14 g/dL (ref 12.0–16.0)
MCH: 22.4 pg — ABNORMAL LOW (ref 26.0–34.0)
MCHC: 31.7 g/dL — AB (ref 32.0–36.0)
MCV: 70.5 fL — ABNORMAL LOW (ref 80.0–100.0)
Platelets: 281 10*3/uL (ref 150–440)
RBC: 6.27 MIL/uL — AB (ref 3.80–5.20)
RDW: 16.6 % — ABNORMAL HIGH (ref 11.5–14.5)
WBC: 13.9 10*3/uL — ABNORMAL HIGH (ref 3.6–11.0)

## 2015-04-28 LAB — CREATININE, SERUM
CREATININE: 0.85 mg/dL (ref 0.44–1.00)
GFR calc Af Amer: >60 mL/min (ref >60)
GFR calc non Af Amer: >60 mL/min (ref >60)

## 2015-04-28 MED ORDER — ONDANSETRON HCL 4 MG PO TABS: 4.0000 mg | ORAL_TABLET | Freq: Four times a day (QID) | ORAL | Status: DC | PRN

## 2015-04-28 MED ORDER — ONDANSETRON HCL 4 MG/2ML IJ SOLN: 4.0000 mg | Freq: Four times a day (QID) | INTRAMUSCULAR | Status: DC | PRN

## 2015-04-28 MED ORDER — DIATRIZOATE MEGLUMINE & SODIUM 66-10 % PO SOLN
15.0000 mL | Freq: Once | ORAL | Status: AC
  Administered 2015-04-28: 15 mL via ORAL

## 2015-04-28 MED ORDER — POTASSIUM CHLORIDE 10 MEQ/100ML IV SOLN
10.0000 meq | INTRAVENOUS | Status: AC
  Administered 2015-04-28 (×4): 10 meq via INTRAVENOUS
  Filled 2015-04-28 (×5): qty 100

## 2015-04-28 MED ORDER — METHYLPREDNISOLONE SODIUM SUCC 125 MG IJ SOLR: 60.0000 mg | Freq: Two times a day (BID) | INTRAMUSCULAR | Status: DC

## 2015-04-28 MED ORDER — ONDANSETRON HCL 4 MG PO TABS: 4.0000 mg | ORAL_TABLET | Freq: Three times a day (TID) | ORAL | Status: DC | PRN

## 2015-04-28 MED ORDER — HEPARIN SODIUM (PORCINE) 5000 UNIT/ML IJ SOLN: 5000.0000 [IU] | Freq: Three times a day (TID) | INTRAMUSCULAR | Status: DC

## 2015-04-28 MED ORDER — DIPHENOXYLATE-ATROPINE 2.5-0.025 MG PO TABS
1.0000 | ORAL_TABLET | Freq: Once | ORAL | Status: AC
  Administered 2015-04-28: 1 via ORAL
  Filled 2015-04-28: qty 1

## 2015-04-28 MED ORDER — MELOXICAM 7.5 MG PO TABS: 7.5000 mg | ORAL_TABLET | Freq: Every day | ORAL | Status: DC

## 2015-04-28 MED ORDER — NICOTINE 21 MG/24HR TD PT24: 21.0000 mg | MEDICATED_PATCH | Freq: Every day | TRANSDERMAL | Status: DC

## 2015-04-28 MED ORDER — SODIUM CHLORIDE 0.9 % IV BOLUS (SEPSIS): 1000.0000 mL | Freq: Once | INTRAVENOUS | Status: DC

## 2015-04-28 MED ORDER — HYDROCODONE-ACETAMINOPHEN 5-325 MG PO TABS: 1.0000 | ORAL_TABLET | ORAL | Status: DC | PRN

## 2015-04-28 MED ORDER — FAMOTIDINE 20 MG PO TABS
10.0000 mg | ORAL_TABLET | Freq: Every day | ORAL | Status: DC
Start: 2015-04-28 — End: 2015-04-28

## 2015-04-28 MED ORDER — IOPAMIDOL (ISOVUE-300) INJECTION 61%
100.0000 mL | Freq: Once | INTRAVENOUS | Status: AC | PRN
  Administered 2015-04-28: 100 mL via INTRAVENOUS

## 2015-04-28 MED ORDER — ASPIRIN 81 MG PO TBEC: 81.0000 mg | DELAYED_RELEASE_TABLET | Freq: Every day | ORAL | Status: DC

## 2015-04-28 MED ORDER — BISACODYL 5 MG PO TBEC: 5.0000 mg | DELAYED_RELEASE_TABLET | Freq: Every day | ORAL | Status: DC | PRN

## 2015-04-28 MED ORDER — BENZTROPINE MESYLATE 0.5 MG PO TABS: 0.5000 mg | ORAL_TABLET | Freq: Two times a day (BID) | ORAL | Status: DC

## 2015-04-28 MED ORDER — INSULIN ASPART 100 UNIT/ML ~~LOC~~ SOLN
10.0000 [IU] | Freq: Once | SUBCUTANEOUS | Status: AC
  Administered 2015-04-28: 10 [IU] via SUBCUTANEOUS
  Filled 2015-04-28: qty 10

## 2015-04-28 MED ORDER — ONDANSETRON HCL 4 MG/2ML IJ SOLN
4.0000 mg | Freq: Once | INTRAMUSCULAR | Status: AC
  Administered 2015-04-28: 4 mg via INTRAVENOUS
  Filled 2015-04-28: qty 2

## 2015-04-28 MED ORDER — IPRATROPIUM-ALBUTEROL 0.5-2.5 (3) MG/3ML IN SOLN: 3.0000 mL | Freq: Four times a day (QID) | RESPIRATORY_TRACT | Status: DC

## 2015-04-28 MED ORDER — VALACYCLOVIR HCL 500 MG PO TABS: 1000.0000 mg | ORAL_TABLET | Freq: Two times a day (BID) | ORAL | Status: DC

## 2015-04-28 MED ORDER — TRAZODONE HCL 50 MG PO TABS: 25.0000 mg | ORAL_TABLET | Freq: Every evening | ORAL | Status: DC | PRN

## 2015-04-28 MED ORDER — POTASSIUM CHLORIDE CRYS ER 20 MEQ PO TBCR
20.0000 meq | EXTENDED_RELEASE_TABLET | Freq: Two times a day (BID) | ORAL | Status: DC
Start: 2015-04-28 — End: 2015-04-28

## 2015-04-28 MED ORDER — DOCUSATE SODIUM 100 MG PO CAPS: 100.0000 mg | ORAL_CAPSULE | Freq: Two times a day (BID) | ORAL | Status: DC

## 2015-04-28 MED ORDER — PANTOPRAZOLE SODIUM 40 MG PO TBEC: 40.0000 mg | DELAYED_RELEASE_TABLET | Freq: Every day | ORAL | Status: DC

## 2015-04-28 MED ORDER — GLIMEPIRIDE 2 MG PO TABS: 4.0000 mg | ORAL_TABLET | Freq: Every day | ORAL | Status: DC

## 2015-04-28 MED ORDER — POTASSIUM CHLORIDE CRYS ER 20 MEQ PO TBCR
40.0000 meq | EXTENDED_RELEASE_TABLET | Freq: Once | ORAL | Status: AC
  Administered 2015-04-28: 40 meq via ORAL
  Filled 2015-04-28: qty 2

## 2015-04-28 MED ORDER — SODIUM CHLORIDE 0.9 % IV SOLN: INTRAVENOUS | Status: DC

## 2015-04-28 MED ORDER — INSULIN ASPART 100 UNIT/ML ~~LOC~~ SOLN: 10.0000 [IU] | Freq: Once | SUBCUTANEOUS | Status: DC

## 2015-04-28 MED ORDER — ACETAMINOPHEN 325 MG PO TABS: 650.0000 mg | ORAL_TABLET | Freq: Four times a day (QID) | ORAL | Status: DC | PRN

## 2015-04-28 MED ORDER — HYDROMORPHONE HCL 1 MG/ML IJ SOLN
1.0000 mg | Freq: Once | INTRAMUSCULAR | Status: AC
  Administered 2015-04-28: 1 mg via INTRAVENOUS
  Filled 2015-04-28: qty 1

## 2015-04-28 MED ORDER — LORATADINE 10 MG PO TABS: 10.0000 mg | ORAL_TABLET | Freq: Every day | ORAL | Status: DC

## 2015-04-28 MED ORDER — ACETAMINOPHEN 650 MG RE SUPP: 650.0000 mg | Freq: Four times a day (QID) | RECTAL | Status: DC | PRN

## 2015-04-28 MED ORDER — SIMVASTATIN 10 MG PO TABS: 10.0000 mg | ORAL_TABLET | Freq: Every day | ORAL | Status: DC

## 2015-04-28 MED ORDER — SODIUM CHLORIDE 0.9 % IV BOLUS (SEPSIS)
1000.0000 mL | Freq: Once | INTRAVENOUS | Status: AC
  Administered 2015-04-28: 1000 mL via INTRAVENOUS

## 2015-04-28 MED ORDER — FAMOTIDINE IN NACL 20-0.9 MG/50ML-% IV SOLN
20.0000 mg | Freq: Once | INTRAVENOUS | Status: AC
  Administered 2015-04-28: 20 mg via INTRAVENOUS
  Filled 2015-04-28: qty 50

## 2015-04-28 MED ORDER — PREGABALIN 50 MG PO CAPS: 50.0000 mg | ORAL_CAPSULE | Freq: Three times a day (TID) | ORAL | Status: DC

## 2015-04-28 MED ORDER — TRIAMTERENE-HCTZ 37.5-25 MG PO TABS: 1.0000 | ORAL_TABLET | Freq: Every day | ORAL | Status: DC

## 2015-04-28 MED ORDER — GABAPENTIN 300 MG PO CAPS: 300.0000 mg | ORAL_CAPSULE | Freq: Three times a day (TID) | ORAL | Status: DC

## 2015-04-28 NOTE — ED Provider Notes (Addendum)
[facility] Emergency Department Provider Note  ____________________________________________  Time seen: Approximately 2:22 PM  I have reviewed the triage vital signs and the nursing notes.   HISTORY  Chief Complaint Abdominal Pain and Emesis    HPI Christy [name] is a 60 y.o. female with a history of morbid obesity, HTN, HL, DM, GERD presenting with nausea, vomiting, loose stool, epigastric pain, and shortness of breath. The patient reports that for the past 2 weeks she has had intractable nausea and vomiting associated with multiple daily episodes of loose nonbloody stools. She is also progressively felt short of breath with exertion. She has not had any chest pain. She has had subjective fever but no chills. No urinary symptoms. No known sick contacts. She does have lightheadedness with standing.   Past Medical History  Diagnosis Date  . Hypertension   . Arthritis   . Malaise and fatigue   . Hyperlipidemia   . Mental status alteration   . Edema   . Schizophrenia (HCC)   . Diabetes mellitus without complication [facility])     Patient takes Metformin  . GERD (gastroesophageal reflux disease)   . Genital herpes   . Asthma     Patient Active Problem List   Diagnosis Date Noted  . Allergic rhinitis 08/02/2014  . Arthropathia 08/02/2014  . CCF (congestive cardiac failure) (HCC) 08/02/2014  . Current tobacco use 08/02/2014  . Diabetic neuropathy (HCC) 08/02/2014  . Genital herpes 08/02/2014  . Acid reflux 08/02/2014  . BP (high blood pressure) 08/02/2014  . HLD (hyperlipidemia) 08/02/2014  . Incontinence 08/02/2014  . Neuropathy (HCC) 08/02/2014  . Drug noncompliance 08/02/2014  . Adult BMI 30+ 08/02/2014  . Arthritis, degenerative 08/02/2014  . Delusional disorder (HCC) 08/02/2014  . Acute exacerbation of chronic paranoid schizophrenia (HCC) 08/02/2014  . Avitaminosis D 08/02/2014    Past Surgical History  Procedure Laterality Date  .  Tonsillectomy    . Foot surgery Right   . Ganglion cyst excision    . Cholecystectomy    . Upper gi endoscopy  07/31/13    mild chronic inflammation and reactive gastropathy-no need for another EGD repeat  . Abdominal hysterectomy      due to fibroids in 1997    Current Outpatient Rx  Name  Route  Sig  Dispense  Refill  . albuterol (PROVENTIL HFA;VENTOLIN HFA) 108 (90 Base) MCG/ACT inhaler   Inhalation   Inhale 1-2 puffs into the lungs every 6 (six) hours as needed for wheezing or shortness of breath.   8 Inhaler   6   . aspirin (ASPIRIN EC) 81 MG EC tablet   Oral   Take 81 mg by mouth daily. Swallow whole.         . benztropine (COGENTIN) 0.5 MG tablet   Oral   Take 0.5 mg by mouth 2 (two) times daily. One tab in the morning and 2 tabs at bedtime         . docusate sodium (COLACE) 100 MG capsule   Oral   Take 100 mg by mouth 2 (two) times daily.         [name] etodolac (LODINE) 500 MG tablet   Oral   Take 500 mg by mouth 2 (two) times daily.         . FluPHENAZine HCl (PROLIXIN PO)   Oral   Take 25 mg by mouth. injection         . furosemide (LASIX) 40 MG tablet   Oral  Take 1 tablet (40 mg total) by mouth daily.   30 tablet   5   . gabapentin (NEURONTIN) 300 MG capsule   Oral   Take 300 mg by mouth 3 (three) times daily.         [name] glimepiride (AMARYL) 4 MG tablet   Oral   Take 1 tablet (4 mg total) by mouth daily with breakfast.   30 tablet   12   . glucose blood (ACCU-CHEK AVIVA PLUS) test strip               . Insulin Glargine (LANTUS SOLOSTAR) 100 UNIT/ML Solostar Pen   Subcutaneous   Inject into the skin. Reported on 04/17/2015         . loratadine (CLARITIN) 10 MG tablet   Oral   Take 10 mg by mouth daily.         . meloxicam (MOBIC) 15 MG tablet      TAKE 1 TABLET BY MOUTH DAILY AS NEEDED FOR PAIN.   30 tablet   3   . ondansetron (ZOFRAN) 4 MG tablet   Oral   Take 1 tablet (4 mg total) by mouth every 8 (eight) hours as  needed for nausea or vomiting.   20 tablet   0   . pantoprazole (PROTONIX) 40 MG tablet   Oral   Take 40 mg by mouth daily.         . potassium chloride SA (K-DUR,KLOR-CON) 20 MEQ tablet   Oral   Take 20 mEq by mouth 2 (two) times daily.         . pregabalin (LYRICA) 50 MG capsule   Oral   Take 50 mg by mouth 3 (three) times daily.         . ranitidine (ZANTAC) 150 MG tablet   Oral   Take 1 tablet (150 mg total) by mouth 2 (two) times daily.   60 tablet   12   . simvastatin (ZOCOR) 10 MG tablet   Oral   Take 1 tablet (10 mg total) by mouth daily.   30 tablet   12   . terconazole (TERAZOL 3) 0.8 % vaginal cream   Vaginal   Place 1 applicator vaginally at bedtime.   20 g   1   . triamterene-hydrochlorothiazide (MAXZIDE-25) 37.5-25 MG tablet   Oral   Take 1 tablet by mouth daily.   30 tablet   5   . valACYclovir (VALTREX) 1000 MG tablet   Oral   Take 1 tablet (1,000 mg total) by mouth 2 (two) times daily.   30 tablet   12     Allergies Cephalexin; Peroxide; and Shellfish allergy  Family History  Problem Relation Age of Onset  . Diabetes Mother   . Pulmonary embolism Mother   . Diabetes Father     Social History Social History  Substance Use Topics  . Smoking status: Current Some Day Smoker -- 0.25 packs/day    Types: Cigarettes  . Smokeless tobacco: Never Used  . Alcohol Use: No    Review of Systems Constitutional: Positive subjective fever. Positive postural lightheadedness. Negative syncope. Eyes: No visual changes. No eye discharge. ENT: No sore throat. No congestion or rhinorrhea. Cardiovascular: Denies chest pain. Denies palpitations. Respiratory: Positive shortness of breath.  Positive cough. Gastrointestinal: Positive epigastric abdominal pain.  Positive nausea, positive vomiting.  Positive diarrhea.  No constipation. Genitourinary: Negative for dysuria. Musculoskeletal: Positive for chronic back pain. Skin: Negative for  rash. Neurological: Negative  for headaches. No focal numbness, tingling or weakness.   10-point ROS otherwise negative.  ____________________________________________   PHYSICAL EXAM:  VITAL SIGNS: ED Triage Vitals  Enc Vitals Group     BP 04/28/15 1146 135/82 mmHg     Pulse Rate 04/28/15 1146 114     Resp 04/28/15 1146 18     Temp 04/28/15 1146 97.9 F (36.6 C)     Temp Source 04/28/15 1146 Oral     SpO2 04/28/15 1146 96 %     Weight 04/28/15 1146 300 lb (136.079 kg)     Height 04/28/15 1146 5\' 8"  (1.727 m)     Head Cir --      Peak Flow --      Pain Score 04/28/15 1147 10     Pain Loc --      Pain Edu? --      Excl. in GC? --     Constitutional: Alert and oriented. Chronically ill appearing but not in any  acute distress. Answers questions appropriately. Eyes: Conjunctivae are normal.  EOMI. No scleral icterus. Head: Atraumatic. Nose: No congestion/rhinnorhea. Mouth/Throat: Mucous membranes are mildly dry.  Neck: No stridor.  Supple.  No meningismus. Cardiovascular: Normal rate, regular rhythm. No murmurs, rubs or gallops.  Respiratory: Normal respiratory effort.  No accessory muscle use or retractions. Lungs CTAB.  No wheezes, rales or ronchi. Gastrointestinal: Obese. Soft and nondistended.  Minimal tenderness to palpation in the epigastrium. No right upper quadrant pain or [name] sign. No guarding or rebound.  No peritoneal signs. Musculoskeletal: No LE edema. No ttp in the calves or palpable cords.  Negative Homan's sign. Neurologic:  A&Ox3.  Speech is clear.  Face and smile are symmetric.  EOMI.  Moves all extremities well. Skin:  Skin is warm, dry and intact. No rash noted. Psychiatric: Mood and affect are normal. Speech and behavior are normal.  Normal judgement.  ____________________________________________   LABS (all labs ordered are listed, but only abnormal results are displayed)  Labs Reviewed  COMPREHENSIVE METABOLIC PANEL - Abnormal; Notable for the  following:    Sodium 132 (*)    Potassium 2.5 (*)    Chloride 89 (*)    Glucose, Bld 306 (*)    Total Protein 8.3 (*)    Anion gap 16 (*)    All other components within normal limits  CBC - Abnormal; Notable for the following:    WBC 13.9 (*)    RBC 6.27 (*)    MCV 70.5 (*)    MCH 22.4 (*)    MCHC 31.7 (*)    RDW 16.6 (*)    All other components within normal limits  RAPID INFLUENZA A&B ANTIGENS ([facility] ONLY)  LIPASE, BLOOD  TROPONIN I  URINALYSIS COMPLETEWITH MICROSCOPIC ([facility] ONLY)  BASIC METABOLIC PANEL  CBG MONITORING, ED   ____________________________________________  EKG  ED ECG REPORT I, Rockne MenghiniNorman, Anne-Caroline, the attending physician, personally viewed and interpreted this ECG.   Date: 04/28/2015  EKG Time: 1142  Rate: 117  Rhythm: sinus tachycardia  Axis: Leftward  Intervals:none  ST&T Change: No ST changes.  ____________________________________________  RADIOLOGY  Dg Chest 2 View  04/28/2015  CLINICAL DATA:  Abdominal pain, vomiting EXAM: CHEST  2 VIEW COMPARISON:  07/19/2013 FINDINGS: Cardiomediastinal silhouette is stable. No acute infiltrate or pleural effusion. No pulmonary edema. Mild degenerative changes mid and lower thoracic spine. IMPRESSION: No active cardiopulmonary disease. Mild degenerative changes mid and lower thoracic spine. Electronically Signed   By: Natasha MeadLiviu  Pop  M.D.   On: 04/28/2015 12:59   Ct Abdomen Pelvis W Contrast  04/28/2015  CLINICAL DATA:  Epigastric pain.  Vomiting for 2 weeks. EXAM: CT ABDOMEN AND PELVIS WITH CONTRAST TECHNIQUE: Multidetector CT imaging of the abdomen and pelvis was performed using the standard protocol following bolus administration of intravenous contrast. CONTRAST:  ISOVUE-300 IOPAMIDOL (ISOVUE-300) INJECTION 61% COMPARISON:  10/02/2014 FINDINGS: Lower chest: Lung bases are clear. No effusions. Heart is normal size. Hepatobiliary: Diffuse fatty infiltration of the liver. Prior cholecystectomy. Pancreas: No focal  abnormality Spleen: No focal abnormality Adrenals/Urinary Tract: No adrenal abnormality. No focal renal abnormality. No stones or hydronephrosis. Urinary bladder is unremarkable. Stomach/Bowel: Appendix is normal. Stomach, large and small bowel grossly unremarkable. Vascular/Lymphatic: No evidence of aneurysm or adenopathy. Reproductive: Prior hysterectomy.  No adnexal masses Other: No free fluid or free air. Musculoskeletal: No acute bony abnormality or focal bone lesion. IMPRESSION: No acute findings in the abdomen or pelvis. Diffuse fatty infiltration of the liver. Electronically Signed   By: Charlett Nose M.D.   On: 04/28/2015 15:49    ____________________________________________   PROCEDURES  Procedure(s) performed: None  Critical Care performed: No ____________________________________________   INITIAL IMPRESSION / ASSESSMENT AND PLAN / ED COURSE  Pertinent labs & imaging results that were available during my care of the patient were reviewed by me and considered in my medical decision making (see chart for details).  60 y.o. female with 2 weeks of inability to tolerate by mouth, associated lightheadedness, cough and shortness of breath. I will evaluate the patient for flu, she suddenly has influenza-like illness. She may have a viral or foodborne GI illness but this would be a long course for that. On my exam, she has an obese abdomen, but she has no severe pain that would be suggestive of an acute intra-abdominal process. However, her labs are concerning, will consider imaging. In the meantime, I will treat her with IV fluids, antiemetics, antidiarrheals, and pain medication.  ----------------------------------------- 5:14 PM on 04/28/2015 -----------------------------------------  The patient is feeling much better since her arrival. She is no longer lightheaded nor is she nauseated. She feels hungry and would like to try to drink something. She is not had any diarrhea since her  arrival. Her CT scan does not show any acute process. At this time, we are working on bringing her blood sugar down and she does have a blood sugar in the 300s and an anion gap which is borderline at 16. We will also continue the repletion of her potassium. The fluid should also help with her sodium. If her numbers are trending in the right direction and she continues to feel well and be able to tolerate PO, I will plan to discharge her home.  ----------------------------------------- 8:00 PM on 04/28/2015 -----------------------------------------  Overall, the patient is feeling significantly better. Her tachycardia has completely resolved and the rest of her vital signs remained stable. She is feeling better and able to tolerate by mouth without vomiting. She has a repeat basic metabolic panel that was sent after a liter of fluid and slightly prematurely before the entirety of her calcium supplementation or her insulin was administered. Her potassium is slightly improved and her sodium is grossly unchanged but still 1:30. Her glucose is still in the 300s but her anion gap is gone from 16-13. Overall, I feel that the patient has clinically moving in the right direction and can likely eventually be discharged tonight but I would like to complete her repletion and reevaluate her  sodium, potassium, glucose after treatment.  I will plan to sign the patient out to Dr. Derrill Kay, who will follow up the results of the repeat BMP and re-evaluate the patient for discharge.  ____________________________________________  FINAL CLINICAL IMPRESSION(S) / ED DIAGNOSES  Final diagnoses:  Nausea vomiting and diarrhea  Hypokalemia  Hyponatremia  Lightheadedness  Hyperglycemia      NEW MEDICATIONS STARTED DURING THIS VISIT:  New Prescriptions   No medications on file     Rockne Menghini, MD 04/28/15 1715  Rockne Menghini, MD 04/28/15 2002

## 2015-04-28 NOTE — Discharge Instructions (Signed)
Please take a clear liquid diet for the next 48 hours, then advance to bland BRAT diet as tolerated.  Please make a follow-up appointment with your primary care doctor in the next 1-2 days to have your potassium and your sodium rechecked. Please drink plenty of fluids stay well-hydrated.  Return to the emergency department if you develop lightheadedness or fainting, severe pain, fever, or any other symptoms concerning to you.

## 2015-04-28 NOTE — ED Notes (Signed)
Says she has been vomiting for 2 weeks.  Today feels like she is going to pass out.  Says she can't even keep liquids down.  Pt is diaphoretic.

## 2015-04-28 NOTE — ED Notes (Signed)
3rd bag of potassium finished, Blood work drawn.

## 2015-04-29 ENCOUNTER — Ambulatory Visit (INDEPENDENT_AMBULATORY_CARE_PROVIDER_SITE_OTHER): Payer: Medicaid Other | Admitting: Family Medicine

## 2015-04-29 ENCOUNTER — Encounter: Payer: Self-pay | Admitting: Family Medicine

## 2015-04-29 VITALS — BP 144/78 | Temp 98.1°F | Resp 20 | Wt 306.0 lb

## 2015-04-29 DIAGNOSIS — R109 Unspecified abdominal pain: Secondary | ICD-10-CM | POA: Diagnosis not present

## 2015-04-29 DIAGNOSIS — R110 Nausea: Secondary | ICD-10-CM

## 2015-04-29 MED ORDER — SUCRALFATE 1 G PO TABS
1.0000 g | ORAL_TABLET | Freq: Three times a day (TID) | ORAL | Status: DC
Start: 2015-04-29 — End: 2016-04-20

## 2015-04-29 MED ORDER — ONDANSETRON HCL 4 MG PO TABS: 4.0000 mg | ORAL_TABLET | Freq: Three times a day (TID) | ORAL | Status: DC | PRN

## 2015-04-29 NOTE — Telephone Encounter (Signed)
She should have follow-up soon from a hospital visit. We can review all these medications in. She needs to bring all of her medications in with her.

## 2015-04-29 NOTE — Telephone Encounter (Signed)
She is coming in today in a few minutes-aa

## 2015-04-29 NOTE — Patient Instructions (Signed)
Stop taking Etodolac 500mg .

## 2015-04-29 NOTE — Progress Notes (Signed)
Patient ID: Christy [name], female   DOB: 05/09/1955, 60 y.o.   MRN: [id number]       Patient: Christy [name] Female    DOB: September 14, 1955   60 y.o.   MRN: [id number] Visit Date: 04/29/2015  Today's Provider: Megan Mans, MD   Chief Complaint  Patient presents with  . Hospitalization Follow-up  . Diabetes   Subjective:    HPI Patient is here today for a hospital follow up: Patient was discharged from [facility] ER last night on 04/28/2015 due to elevated BS. She reports that she checked her BS earlier today and it was 223.  She also mentioned that she had nausea, vomiting, loose stool, and abdominal pain. She reports that no medications were changed, and patient reports that a Rx for Zofran was written. She reports that she has not had it filled yet. Patient also reports that her potassium was very low, and she was given IV fluids. Patient reports that she still has some nausea today, but feels a little better.     Allergies  Allergen Reactions  . Cephalexin Itching  . Peroxide [Hydrogen Peroxide] Swelling  . Shellfish Allergy Nausea And Vomiting   Previous Medications   ALBUTEROL (PROVENTIL HFA;VENTOLIN HFA) 108 (90 BASE) MCG/ACT INHALER    Inhale 1-2 puffs into the lungs every 6 (six) hours as needed for wheezing or shortness of breath.   ASPIRIN EC 81 MG TABLET    Take 81 mg by mouth daily.   BENZTROPINE (COGENTIN) 0.5 MG TABLET    Take 0.5 mg by mouth 2 (two) times daily. One tab in the morning and 2 tabs at bedtime   DOCUSATE SODIUM (COLACE) 100 MG CAPSULE    Take 100 mg by mouth 2 (two) times daily.   ETODOLAC (LODINE) 500 MG TABLET    Take 500 mg by mouth 2 (two) times daily.   FLUPHENAZINE HCL (PROLIXIN PO)    Take 25 mg by mouth. injection   FUROSEMIDE (LASIX) 40 MG TABLET    Take 1 tablet (40 mg total) by mouth daily.   GABAPENTIN (NEURONTIN) 300 MG CAPSULE    Take 300 mg by mouth 3 (three) times daily.   GLIMEPIRIDE (AMARYL) 4 MG TABLET    Take 1 tablet (4 mg total) by mouth  daily with breakfast.   LORATADINE (CLARITIN) 10 MG TABLET    Take 10 mg by mouth daily.   MELOXICAM (MOBIC) 15 MG TABLET    TAKE 1 TABLET BY MOUTH DAILY AS NEEDED FOR PAIN.   ONDANSETRON (ZOFRAN) 4 MG TABLET    Take 1 tablet (4 mg total) by mouth every 8 (eight) hours as needed for nausea or vomiting.   PANTOPRAZOLE (PROTONIX) 40 MG TABLET    Take 40 mg by mouth daily.   POTASSIUM CHLORIDE SA (K-DUR,KLOR-CON) 20 MEQ TABLET    Take 20 mEq by mouth 2 (two) times daily.   PREGABALIN (LYRICA) 50 MG CAPSULE    Take 50 mg by mouth 3 (three) times daily.   RANITIDINE (ZANTAC) 150 MG TABLET    Take 1 tablet (150 mg total) by mouth 2 (two) times daily.   SIMVASTATIN (ZOCOR) 10 MG TABLET    Take 1 tablet (10 mg total) by mouth daily.   TERCONAZOLE (TERAZOL 3) 0.8 % VAGINAL CREAM    Place 1 applicator vaginally at bedtime.   TRIAMTERENE-HYDROCHLOROTHIAZIDE (MAXZIDE-25) 37.5-25 MG TABLET    Take 1 tablet by mouth daily.   VALACYCLOVIR (VALTREX) 1000 MG TABLET  Take 1 tablet (1,000 mg total) by mouth 2 (two) times daily.    Review of Systems  Constitutional: Positive for activity change, appetite change and fatigue.  Eyes: Negative.   Respiratory: Negative.   Gastrointestinal: Positive for nausea and abdominal pain. Negative for vomiting, diarrhea, constipation and blood in stool.  Endocrine: Negative.   Genitourinary: Negative.   Psychiatric/Behavioral: Negative.     Social History  Substance Use Topics  . Smoking status: Current Some Day Smoker -- 0.25 packs/day    Types: Cigarettes  . Smokeless tobacco: Never Used  . Alcohol Use: No   Objective:   BP 144/78 mmHg  Temp(Src) 98.1 F (36.7 C)  Resp 20  Wt 306 lb (138.801 kg)  Physical Exam  Constitutional: She appears well-developed and well-nourished.  HENT:  Head: Normocephalic and atraumatic.  Right Ear: External ear normal.  Left Ear: External ear normal.  Nose: Nose normal.  Mouth/Throat: Oropharynx is clear and moist.  Eyes:  Conjunctivae are normal.  Neck: Neck supple. No thyromegaly present.  Cardiovascular: Normal rate, regular rhythm and normal heart sounds.   Pulmonary/Chest: Effort normal and breath sounds normal.  Abdominal: Soft. She exhibits no distension. There is no tenderness. There is no rebound.  Lymphadenopathy:    She has no cervical adenopathy.  Neurological: She is alert. No cranial nerve deficit. She exhibits normal muscle tone. Coordination normal.  Skin: Skin is warm and dry.  Psychiatric: She has a normal mood and affect. Her behavior is normal. Judgment and thought content normal.        Assessment & Plan:     1. Nausea This symptom has improved. More than 50% of this visit is spent in discussing, counseling, and coordination of care. - sucralfate (CARAFATE) 1 g tablet; Take 1 tablet (1 g total) by mouth 4 (four) times daily -  with meals and at bedtime.  Dispense: 120 tablet; Refill: 12 - ondansetron (ZOFRAN) 4 MG tablet; Take 1 tablet (4 mg total) by mouth every 8 (eight) hours as needed for nausea or vomiting.  Dispense: 55 tablet; Refill: 0  2. Abdominal pain in female I am really unsure what this pain is from. I think it is functional. She has had it for many years and has had complete workups, both GI and gynecologic. She is presently seeing Dr. Greggory Keenefrancesco from GYN again. Add Carafate before meals and at bedtime. Consider stopping PPI  as in a rare patient could possibly cause the pain. - sucralfate (CARAFATE) 1 g tablet; Take 1 tablet (1 g total) by mouth 4 (four) times daily -  with meals and at bedtime.  Dispense: 120 tablet; Refill: 12 - Ambulatory referral to Gastroenterology 3.Morbid obesity Unfortunately, due to her thought process /schizophrenia, the patient actually thinks she is too thin.. Have again advised her to lose weight. 4. Schizophrenia Again, advised her new caregiver to make sure that she takes her medications for her mental illness.      Kamillah Didonato Wendelyn BreslowGilbert  Jr, MD  [facility]

## 2015-05-01 ENCOUNTER — Telehealth: Payer: Self-pay | Admitting: Family Medicine

## 2015-05-01 ENCOUNTER — Other Ambulatory Visit: Payer: Medicaid Other

## 2015-05-01 DIAGNOSIS — Z6841 Body Mass Index (BMI) 40.0 and over, adult: Secondary | ICD-10-CM

## 2015-05-01 DIAGNOSIS — Z90710 Acquired absence of both cervix and uterus: Secondary | ICD-10-CM | POA: Insufficient documentation

## 2015-05-01 NOTE — Patient Instructions (Signed)
1. Physical exam is unremarkable today. 2. Dilatation that right lower quadrant pain is related to a gynecologic source 3. Ultrasound is scheduled and he will be notified by phone of results 4. Recommend follow-up with GI consultation for assessment of COLON.

## 2015-05-01 NOTE — Telephone Encounter (Signed)
Minnies caregiver called wanting to know if [name] has has a recent Pelvic US.  Dr. Algis Downs is wanting to order one but she thinks Mionnie has already had one recently.  Her call back is [phone]   Thanks Barth Kirkseri

## 2015-05-01 NOTE — Telephone Encounter (Signed)
Patient's last pelvic US was done in 09/19/2014. Advised patient's caregiver.

## 2015-05-05 ENCOUNTER — Other Ambulatory Visit: Payer: Self-pay

## 2015-05-05 MED ORDER — FUROSEMIDE 40 MG PO TABS: 40.0000 mg | ORAL_TABLET | Freq: Every day | ORAL | Status: DC

## 2015-05-06 ENCOUNTER — Other Ambulatory Visit: Payer: Medicaid Other

## 2015-05-19 ENCOUNTER — Ambulatory Visit: Payer: Medicaid Other | Admitting: Family Medicine

## 2015-05-21 ENCOUNTER — Ambulatory Visit: Payer: Medicaid Other | Admitting: Family Medicine

## 2015-05-27 ENCOUNTER — Ambulatory Visit (INDEPENDENT_AMBULATORY_CARE_PROVIDER_SITE_OTHER): Payer: Medicaid Other | Admitting: Family Medicine

## 2015-05-27 ENCOUNTER — Encounter: Payer: Self-pay | Admitting: Family Medicine

## 2015-05-27 VITALS — BP 118/74 | HR 78 | Temp 98.0°F | Resp 18 | Wt 305.0 lb

## 2015-05-27 DIAGNOSIS — M158 Other polyosteoarthritis: Secondary | ICD-10-CM

## 2015-05-27 DIAGNOSIS — R1084 Generalized abdominal pain: Secondary | ICD-10-CM | POA: Diagnosis not present

## 2015-05-27 DIAGNOSIS — R1110 Vomiting, unspecified: Secondary | ICD-10-CM | POA: Diagnosis not present

## 2015-05-27 DIAGNOSIS — E1142 Type 2 diabetes mellitus with diabetic polyneuropathy: Secondary | ICD-10-CM

## 2015-05-27 LAB — GLUCOSE, POCT (MANUAL RESULT ENTRY): POC Glucose: 320 mg/dl — AB (ref 70–99)

## 2015-05-27 NOTE — Progress Notes (Signed)
Patient ID: Christy [name], female   DOB: 1955-04-08, 60 y.o.   MRN: [id number]    Subjective:  HPI  Patient is here for follow up after April 4th visit. She was prescribed Zofran and Carafate but patient is not sure if she is taking both of these medications. Patient states she is still hurting in her stomach and not feeling better. She states she is still vomiting also, but is able to keep soup and salad down better than other foods. She is 1 lb down in 1 month.  This is a chronic problem which is been going on for well over 5 years. It has been evaluated multiple times. We will evaluate again if it persists Prior to Admission medications   Medication Sig Start Date End Date Taking? Authorizing Provider  albuterol (PROVENTIL HFA;VENTOLIN HFA) 108 (90 Base) MCG/ACT inhaler Inhale 1-2 puffs into the lungs every 6 (six) hours as needed for wheezing or shortness of breath. 03/13/15   Maple Hudson., MD  aspirin EC 81 MG tablet Take 81 mg by mouth daily.    Historical Provider, MD  benztropine (COGENTIN) 0.5 MG tablet Take 0.5 mg by mouth 2 (two) times daily. One tab in the morning and 2 tabs at bedtime    Historical Provider, MD  docusate sodium (COLACE) 100 MG capsule Take 100 mg by mouth 2 (two) times daily.    Historical Provider, MD  FluPHENAZine HCl (PROLIXIN PO) Take 25 mg by mouth. injection    Historical Provider, MD  furosemide (LASIX) 40 MG tablet Take 1 tablet (40 mg total) by mouth daily. 05/05/15   Richard Hulen Shouts., MD  gabapentin (NEURONTIN) 300 MG capsule Take 300 mg by mouth 3 (three) times daily.    Historical Provider, MD  glimepiride (AMARYL) 4 MG tablet Take 1 tablet (4 mg total) by mouth daily with breakfast. 10/15/14   Maple Hudson., MD  loratadine (CLARITIN) 10 MG tablet Take 10 mg by mouth daily.    Historical Provider, MD  meloxicam (MOBIC) 15 MG tablet TAKE 1 TABLET BY MOUTH DAILY AS NEEDED FOR PAIN. 08/22/14   Maple Hudson., MD  ondansetron (ZOFRAN)  4 MG tablet Take 1 tablet (4 mg total) by mouth every 8 (eight) hours as needed for nausea or vomiting. 04/29/15   Richard Hulen Shouts., MD  pantoprazole (PROTONIX) 40 MG tablet Take 40 mg by mouth daily.    Historical Provider, MD  potassium chloride SA (K-DUR,KLOR-CON) 20 MEQ tablet Take 20 mEq by mouth 2 (two) times daily.    Historical Provider, MD  pregabalin (LYRICA) 50 MG capsule Take 50 mg by mouth 3 (three) times daily.    Historical Provider, MD  ranitidine (ZANTAC) 150 MG tablet Take 1 tablet (150 mg total) by mouth 2 (two) times daily. 10/15/14   Richard Hulen Shouts., MD  simvastatin (ZOCOR) 10 MG tablet Take 1 tablet (10 mg total) by mouth daily. 11/18/14   Richard Hulen Shouts., MD  sucralfate (CARAFATE) 1 g tablet Take 1 tablet (1 g total) by mouth 4 (four) times daily -  with meals and at bedtime. 04/29/15   Richard Hulen Shouts., MD  terconazole (TERAZOL 3) 0.8 % vaginal cream Place 1 applicator vaginally at bedtime. Patient not taking: Reported on 04/29/2015 04/15/15   Maple Hudson., MD  triamterene-hydrochlorothiazide (MAXZIDE-25) 37.5-25 MG tablet Take 1 tablet by mouth daily. 01/08/15   Richard Hulen Shouts., MD  valACYclovir [facility])  1000 MG tablet Take 1 tablet (1,000 mg total) by mouth 2 (two) times daily. 10/15/14   Richard Hulen Shouts., MD    Patient Active Problem List   Diagnosis Date Noted  . Status post abdominal hysterectomy 05/01/2015  . Morbid obesity with body mass index (BMI) of 45.0 to 49.9 in adult [facility]) 05/01/2015  . Acute respiratory failure (HCC) 04/28/2015  . Allergic rhinitis 08/02/2014  . Arthropathia 08/02/2014  . CCF (congestive cardiac failure) (HCC) 08/02/2014  . Current tobacco use 08/02/2014  . Diabetic neuropathy (HCC) 08/02/2014  . Genital herpes 08/02/2014  . Acid reflux 08/02/2014  . BP (high blood pressure) 08/02/2014  . HLD (hyperlipidemia) 08/02/2014  . Incontinence 08/02/2014  . Neuropathy (HCC) 08/02/2014  . Drug noncompliance  08/02/2014  . Adult BMI 30+ 08/02/2014  . Arthritis, degenerative 08/02/2014  . Delusional disorder (HCC) 08/02/2014  . Acute exacerbation of chronic paranoid schizophrenia (HCC) 08/02/2014  . Avitaminosis D 08/02/2014  . Can't get food down 07/16/2013    Past Medical History  Diagnosis Date  . Hypertension   . Arthritis   . Malaise and fatigue   . Hyperlipidemia   . Mental status alteration   . Edema   . Schizophrenia (HCC)   . Diabetes mellitus without complication [facility])     Patient takes Metformin  . GERD (gastroesophageal reflux disease)   . Genital herpes   . Asthma     Social History   Social History  . Marital Status: Single    Spouse Name: N/A  . Number of Children: N/A  . Years of Education: N/A   Occupational History  . Not on file.   Social History Main Topics  . Smoking status: Current Some Day Smoker -- 0.25 packs/day    Types: Cigarettes  . Smokeless tobacco: Never Used  . Alcohol Use: No  . Drug Use: No  . Sexual Activity: Yes   Other Topics Concern  . Not on file   Social History Narrative    Allergies  Allergen Reactions  . Cephalexin Itching  . Peroxide [Hydrogen Peroxide] Swelling  . Shellfish Allergy Nausea And Vomiting    Review of Systems  Constitutional: Positive for malaise/fatigue.  Respiratory: Negative.   Cardiovascular: Negative.   Gastrointestinal: Positive for nausea, vomiting and abdominal pain.  Musculoskeletal: Positive for myalgias and joint pain.  Neurological: Positive for weakness.    Immunization History  Administered Date(s) Administered  . Td 11/10/2000   Objective:  There were no vitals taken for this visit.  Physical Exam  Constitutional: She is oriented to person, place, and time and well-developed, well-nourished, and in no distress.  Eyes: Conjunctivae are normal. Pupils are equal, round, and reactive to light.  Cardiovascular: Normal rate, regular rhythm, normal heart sounds and intact distal  pulses.   No murmur heard. Pulmonary/Chest: Effort normal and breath sounds normal. No respiratory distress. She has no wheezes.  Abdominal: There is tenderness (mild RLQ chronic x 10 years). There is no rebound and no guarding.  Neurological: She is alert and oriented to person, place, and time.    Lab Results  Component Value Date   WBC 13.9* 04/28/2015   HGB 14.0 04/28/2015   HCT 44.2 04/28/2015   PLT 281 04/28/2015   GLUCOSE 247* 04/28/2015   CHOL 182 04/15/2015   TRIG 215* 04/15/2015   HDL 46 04/15/2015   LDLCALC 93 04/15/2015   TSH 4.680* 04/15/2015   HGBA1C 8.3 02/10/2015    CMP  Component Value Date/Time   NA 132* 04/28/2015 2216   NA 139 04/15/2015 1205   NA 134* 03/16/2013 1256   K 3.4* 04/28/2015 2216   K 4.0 03/16/2013 1256   CL 94* 04/28/2015 2216   CL 99 03/16/2013 1256   CO2 23 04/28/2015 2216   CO2 29 03/16/2013 1256   GLUCOSE 247* 04/28/2015 2216   GLUCOSE 223* 04/15/2015 1205   GLUCOSE 145* 03/16/2013 1256   BUN 10 04/28/2015 2216   BUN 6* 04/15/2015 1205   BUN 14 03/16/2013 1256   CREATININE 0.85 04/28/2015 2216   CREATININE 0.88 04/28/2015 2216   CREATININE 0.8 05/06/2014   CREATININE 0.84 03/16/2013 1256   CALCIUM 8.9 04/28/2015 2216   CALCIUM 8.8 03/16/2013 1256   PROT 8.3* 04/28/2015 1159   PROT 6.8 04/15/2015 1205   PROT 7.2 08/11/2012 1652   ALBUMIN 4.6 04/28/2015 1159   ALBUMIN 4.4 04/15/2015 1205   ALBUMIN 3.8 08/11/2012 1652   AST 37 04/28/2015 1159   AST 19 08/11/2012 1652   ALT 29 04/28/2015 1159   ALT 19 08/11/2012 1652   ALKPHOS 93 04/28/2015 1159   ALKPHOS 82 08/11/2012 1652   BILITOT 1.1 04/28/2015 1159   BILITOT 0.6 04/15/2015 1205   BILITOT 0.6 08/11/2012 1652   GFRNONAA >60 04/28/2015 2216   GFRNONAA >60 04/28/2015 2216   GFRNONAA >60 03/16/2013 1256   GFRAA >60 04/28/2015 2216   GFRAA >60 04/28/2015 2216   GFRAA >60 03/16/2013 1256    Assessment and Plan :  1. Generalized abdominal pain Not better.  Unchanged, continuous issue. Tender on exam in the RLQ but that has been this way for 10 years. Will stop Meloxicam that can be causing her abdominal pain.I think this is certainly contributing to her pain. Avoid all prescription and OTC NSAIDs.  2. Intractable vomiting with nausea, vomiting of unspecified type Unchanged.  3. Other osteoarthritis involving multiple joints Stop Meloxicam, 4. Schizophrenia Apparently controlled as long as she always takes her medication. Will address diabetes on the next visit may need to add a medication most likely. Will check A1C next time. Random glucose today 320 not fasting, will probably consider adding Actos but would like to see if her abdominal pain improves with stopping Meloxicam first before adding another medication.  Patient was seen and examined by Dr. Bosie Closichard L Gilbert and note was scribed by Samara DeistAnastasiya Aleksandrova, RMA.    Julieanne Mansonichard Gilbert MD [facility] 05/27/2015 3:13 PM

## 2015-05-29 ENCOUNTER — Other Ambulatory Visit: Payer: Self-pay | Admitting: Family Medicine

## 2015-05-31 ENCOUNTER — Emergency Department: Payer: Medicaid Other

## 2015-05-31 ENCOUNTER — Emergency Department
Admission: EM | Admit: 2015-05-31 | Discharge: 2015-05-31 | Disposition: A | Discharge status: Home / Self Care | Payer: Medicaid Other | Attending: Emergency Medicine | Admitting: Emergency Medicine

## 2015-05-31 ENCOUNTER — Encounter: Payer: Self-pay | Admitting: Emergency Medicine

## 2015-05-31 DIAGNOSIS — J45909 Unspecified asthma, uncomplicated: Secondary | ICD-10-CM | POA: Insufficient documentation

## 2015-05-31 DIAGNOSIS — R0789 Other chest pain: Secondary | ICD-10-CM | POA: Diagnosis not present

## 2015-05-31 DIAGNOSIS — E785 Hyperlipidemia, unspecified: Secondary | ICD-10-CM | POA: Diagnosis not present

## 2015-05-31 DIAGNOSIS — J9620 Acute and chronic respiratory failure, unspecified whether with hypoxia or hypercapnia: Secondary | ICD-10-CM | POA: Insufficient documentation

## 2015-05-31 DIAGNOSIS — K219 Gastro-esophageal reflux disease without esophagitis: Secondary | ICD-10-CM | POA: Diagnosis not present

## 2015-05-31 DIAGNOSIS — R197 Diarrhea, unspecified: Secondary | ICD-10-CM | POA: Diagnosis not present

## 2015-05-31 DIAGNOSIS — R112 Nausea with vomiting, unspecified: Secondary | ICD-10-CM | POA: Diagnosis not present

## 2015-05-31 DIAGNOSIS — I509 Heart failure, unspecified: Secondary | ICD-10-CM | POA: Diagnosis not present

## 2015-05-31 DIAGNOSIS — Z7982 Long term (current) use of aspirin: Secondary | ICD-10-CM | POA: Insufficient documentation

## 2015-05-31 DIAGNOSIS — Z79899 Other long term (current) drug therapy: Secondary | ICD-10-CM | POA: Diagnosis not present

## 2015-05-31 DIAGNOSIS — F209 Schizophrenia, unspecified: Secondary | ICD-10-CM | POA: Diagnosis not present

## 2015-05-31 DIAGNOSIS — I110 Hypertensive heart disease with heart failure: Secondary | ICD-10-CM | POA: Diagnosis not present

## 2015-05-31 DIAGNOSIS — M1990 Unspecified osteoarthritis, unspecified site: Secondary | ICD-10-CM | POA: Diagnosis not present

## 2015-05-31 DIAGNOSIS — E1140 Type 2 diabetes mellitus with diabetic neuropathy, unspecified: Secondary | ICD-10-CM | POA: Diagnosis not present

## 2015-05-31 DIAGNOSIS — Z7984 Long term (current) use of oral hypoglycemic drugs: Secondary | ICD-10-CM | POA: Diagnosis not present

## 2015-05-31 DIAGNOSIS — R079 Chest pain, unspecified: Secondary | ICD-10-CM

## 2015-05-31 LAB — CBC WITH DIFFERENTIAL/PLATELET
Basophils Absolute: 0.1 10*3/uL (ref 0–0.1)
Basophils Relative: 1 %
Eosinophils Absolute: 0.4 10*3/uL (ref 0–0.7)
Eosinophils Relative: 6 %
HEMATOCRIT: 38.9 % (ref 35.0–47.0)
Hemoglobin: 12.3 g/dL (ref 12.0–16.0)
LYMPHS ABS: 1.6 10*3/uL (ref 1.0–3.6)
LYMPHS PCT: 21 %
MCH: 22.3 pg — ABNORMAL LOW (ref 26.0–34.0)
MCHC: 31.6 g/dL — AB (ref 32.0–36.0)
MCV: 70.6 fL — AB (ref 80.0–100.0)
MONO ABS: 0.8 10*3/uL (ref 0.2–0.9)
MONOS PCT: 10 %
NEUTROS ABS: 4.6 10*3/uL (ref 1.4–6.5)
Neutrophils Relative %: 62 %
Platelets: 212 10*3/uL (ref 150–440)
RBC: 5.5 MIL/uL — ABNORMAL HIGH (ref 3.80–5.20)
RDW: 16.9 % — AB (ref 11.5–14.5)
WBC: 7.4 10*3/uL (ref 3.6–11.0)

## 2015-05-31 LAB — URINALYSIS COMPLETE WITH MICROSCOPIC (ARMC ONLY)
BACTERIA UA: NONE SEEN
BILIRUBIN URINE: NEGATIVE
Glucose, UA: 50 mg/dL — AB
Hgb urine dipstick: NEGATIVE
KETONES UR: NEGATIVE mg/dL
LEUKOCYTES UA: NEGATIVE
NITRITE: NEGATIVE
PROTEIN: NEGATIVE mg/dL
RBC / HPF: NONE SEEN RBC/hpf (ref 0–5)
Specific Gravity, Urine: 1.004 — ABNORMAL LOW (ref 1.005–1.030)
pH: 7 (ref 5.0–8.0)

## 2015-05-31 LAB — COMPREHENSIVE METABOLIC PANEL
ALT: 18 U/L (ref 14–54)
AST: 23 U/L (ref 15–41)
Albumin: 3.9 g/dL (ref 3.5–5.0)
Alkaline Phosphatase: 75 U/L (ref 38–126)
Anion gap: 11 (ref 5–15)
BUN: 5 mg/dL — AB (ref 6–20)
CHLORIDE: 100 mmol/L — AB (ref 101–111)
CO2: 26 mmol/L (ref 22–32)
CREATININE: 0.75 mg/dL (ref 0.44–1.00)
Calcium: 9.4 mg/dL (ref 8.9–10.3)
GFR calc Af Amer: >60 mL/min (ref >60)
GFR calc non Af Amer: >60 mL/min (ref >60)
Glucose, Bld: 304 mg/dL — ABNORMAL HIGH (ref 65–99)
Potassium: 3.9 mmol/L (ref 3.5–5.1)
Sodium: 137 mmol/L (ref 135–145)
Total Bilirubin: 0.4 mg/dL (ref 0.3–1.2)
Total Protein: 6.9 g/dL (ref 6.5–8.1)

## 2015-05-31 LAB — TROPONIN I: Troponin I: <0.03 ng/mL (ref <0.031)

## 2015-05-31 LAB — LIPASE, BLOOD: Lipase: 14 U/L (ref 11–51)

## 2015-05-31 MED ORDER — GI COCKTAIL ~~LOC~~
30.0000 mL | Freq: Once | ORAL | Status: AC
  Administered 2015-05-31: 30 mL via ORAL
  Filled 2015-05-31: qty 30

## 2015-05-31 NOTE — ED Provider Notes (Signed)
[facility] Emergency Department Provider Note   ____________________________________________  Time seen: Approximately 3:20 PM I have reviewed the triage vital signs and the triage nursing note.  HISTORY  Chief Complaint Chest Pain   Historian Patient, somewhat limited by underlying psychiatric disease, question her reliability as a historian  HPI Christy [name] is a 60 y.o. female with a history of multiple risk factors for coronary disease, is here complaining of time 10 chest pain. She states that she started feeling "sick" overnight and states that she had nausea with some nonbloody nonbilious emesis overnight as well as a little bit of diarrhea. This morning in all days she's had some epigastric and lower central chest discomfort which she is unable to describe the character of. She's had a little bit of shortness of breath, but no pleuritic chest pain, fever, coughing, or sputum production.  No black or bloody stools.    Past Medical History  Diagnosis Date  . Hypertension   . Arthritis   . Malaise and fatigue   . Hyperlipidemia   . Mental status alteration   . Edema   . Schizophrenia (HCC)   . Diabetes mellitus without complication [facility])     Patient takes Metformin  . GERD (gastroesophageal reflux disease)   . Genital herpes   . Asthma     Patient Active Problem List   Diagnosis Date Noted  . Status post abdominal hysterectomy 05/01/2015  . Morbid obesity with body mass index (BMI) of 45.0 to 49.9 in adult [facility]) 05/01/2015  . Acute respiratory failure (HCC) 04/28/2015  . Allergic rhinitis 08/02/2014  . Arthropathia 08/02/2014  . CCF (congestive cardiac failure) (HCC) 08/02/2014  . Current tobacco use 08/02/2014  . Diabetic neuropathy (HCC) 08/02/2014  . Genital herpes 08/02/2014  . Acid reflux 08/02/2014  . BP (high blood pressure) 08/02/2014  . HLD (hyperlipidemia) 08/02/2014  . Incontinence 08/02/2014  . Neuropathy (HCC) 08/02/2014   . Drug noncompliance 08/02/2014  . Adult BMI 30+ 08/02/2014  . Arthritis, degenerative 08/02/2014  . Delusional disorder (HCC) 08/02/2014  . Acute exacerbation of chronic paranoid schizophrenia (HCC) 08/02/2014  . Avitaminosis D 08/02/2014  . Can't get food down 07/16/2013    Past Surgical History  Procedure Laterality Date  . Tonsillectomy    . Foot surgery Right   . Ganglion cyst excision    . Cholecystectomy    . Upper gi endoscopy  07/31/13    mild chronic inflammation and reactive gastropathy-no need for another EGD repeat  . Abdominal hysterectomy      due to fibroids in 1997    Current Outpatient Rx  Name  Route  Sig  Dispense  Refill  . albuterol (PROVENTIL HFA;VENTOLIN HFA) 108 (90 Base) MCG/ACT inhaler   Inhalation   Inhale 1-2 puffs into the lungs every 6 (six) hours as needed for wheezing or shortness of breath.   8 Inhaler   6   . aspirin EC 81 MG tablet   Oral   Take 81 mg by mouth daily.         . benztropine (COGENTIN) 0.5 MG tablet   Oral   Take 0.5 mg by mouth 2 (two) times daily. One tab in the morning and 2 tabs at bedtime         . docusate sodium (COLACE) 100 MG capsule   Oral   Take 100 mg by mouth 2 (two) times daily.         . FluPHENAZine HCl (PROLIXIN PO)  Oral   Take 25 mg by mouth. injection         . furosemide (LASIX) 40 MG tablet   Oral   Take 1 tablet (40 mg total) by mouth daily.   30 tablet   12   . gabapentin (NEURONTIN) 300 MG capsule   Oral   Take 300 mg by mouth 3 (three) times daily.         [name]. glimepiride (AMARYL) 4 MG tablet   Oral   Take 1 tablet (4 mg total) by mouth daily with breakfast.   30 tablet   12   . loratadine (CLARITIN) 10 MG tablet   Oral   Take 10 mg by mouth daily.         . ondansetron (ZOFRAN) 4 MG tablet   Oral   Take 1 tablet (4 mg total) by mouth every 8 (eight) hours as needed for nausea or vomiting.   55 tablet   0   . pantoprazole (PROTONIX) 40 MG tablet   Oral   Take  40 mg by mouth daily.         . potassium chloride SA (K-DUR,KLOR-CON) 20 MEQ tablet   Oral   Take 20 mEq by mouth 2 (two) times daily.         . pregabalin (LYRICA) 50 MG capsule   Oral   Take 50 mg by mouth 3 (three) times daily.         . ranitidine (ZANTAC) 150 MG tablet   Oral   Take 1 tablet (150 mg total) by mouth 2 (two) times daily.   60 tablet   12   . simvastatin (ZOCOR) 10 MG tablet   Oral   Take 1 tablet (10 mg total) by mouth daily.   30 tablet   12   . sucralfate (CARAFATE) 1 g tablet   Oral   Take 1 tablet (1 g total) by mouth 4 (four) times daily -  with meals and at bedtime.   120 tablet   12   . terconazole (TERAZOL 3) 0.8 % vaginal cream   Vaginal   Place 1 applicator vaginally at bedtime. Patient not taking: Reported on 04/29/2015   20 g   1   . triamterene-hydrochlorothiazide (MAXZIDE-25) 37.5-25 MG tablet   Oral   Take 1 tablet by mouth daily.   30 tablet   5   . valACYclovir (VALTREX) 1000 MG tablet   Oral   Take 1 tablet (1,000 mg total) by mouth 2 (two) times daily.   30 tablet   12     Allergies Cephalexin; Peroxide; and Shellfish allergy  Family History  Problem Relation Age of Onset  . Diabetes Mother   . Pulmonary embolism Mother   . Diabetes Father     Social History Social History  Substance Use Topics  . Smoking status: Current Some Day Smoker -- 0.25 packs/day    Types: Cigarettes  . Smokeless tobacco: Never Used  . Alcohol Use: No    Review of Systems  Constitutional: Negative for fever or chills. Eyes: Negative for visual changes. ENT: Negative for sore throat. Cardiovascular: Negative for palpitations. Respiratory: Negative for cough. Gastrointestinal: Reports some mild to moderate epigastric abdominal pain as bloating. Genitourinary: Negative for dysuria. Musculoskeletal: Feels some pain to the back, but really unable to characterize further. Skin: Negative for rash. Neurological: Negative for  headache. 10 point Review of Systems otherwise negative ____________________________________________   PHYSICAL EXAM:  VITAL SIGNS: ED Triage  Vitals  Enc Vitals Group     BP 05/31/15 1501 150/75 mmHg     Pulse Rate 05/31/15 1501 87     Resp 05/31/15 1501 18     Temp 05/31/15 1501 97.6 F (36.4 C)     Temp Source 05/31/15 1501 Oral     SpO2 05/31/15 1501 97 %     Weight 05/31/15 1501 312 lb (141.522 kg)     Height 05/31/15 1501  (1.753 m)     Head Cir --      Peak Flow --      Pain Score 05/31/15 1504 9     Pain Loc --      Pain Edu? --      Excl. in GC? --      Constitutional: Alert and Cooperative. Well appearing overall and in no distress. HEENT   Head: Normocephalic and atraumatic.      Eyes: Conjunctivae are normal. PERRL. Normal extraocular movements.      Ears:         Nose: No congestion/rhinnorhea.   Mouth/Throat: Mucous membranes are moist.   Neck: No stridor. Cardiovascular/Chest: Normal rate, regular rhythm.  No murmurs, rubs, or gallops. Respiratory: Normal respiratory effort without tachypnea nor retractions. Breath sounds are clear and equal bilaterally. No wheezes/rales/rhonchi. Gastrointestinal: Soft. No distention, no guarding, no rebound. Mild tenderness in epigastrium. No right lower quadrant tenderness. No lower abdominal tenderness. Morbidly obese. Genitourinary/rectal:Deferred Musculoskeletal: Nontender with normal range of motion in all extremities. No joint effusions.  No lower extremity tenderness.  No edema. Neurologic:  Normal speech and language. No gross or focal neurologic deficits are appreciated. Skin:  Skin is warm, dry and intact. No rash noted. Psychiatric: Somewhat flat affect, but no unstable mood. Speech and behavior are normal.   ____________________________________________   EKG I, Governor Rooks, MD, the attending physician have personally viewed and interpreted all ECGs.  83 bpm. Normal sinus rhythm. Narrow QRS.  Normal axis. Nonspecific T waves inferior and laterally. ____________________________________________  LABS (pertinent positives/negatives)  Urinalysis negative Conference metabolic panel without significant abnormalities. Glucose 304 Lipase 14 Troponin less than 0.03 White blood count 7.4, hemoglobin 12.3 and platelet count 212  ____________________________________________  RADIOLOGY All Xrays were viewed by me. Imaging interpreted by Radiologist.  Abdomen acute with chest:IMPRESSION: No acute cardiopulmonary process.  Paucity small bowel gas. __________________________________________  PROCEDURES  Procedure(s) performed: None  Critical Care performed: None  ____________________________________________   ED COURSE / ASSESSMENT AND PLAN  Pertinent labs & imaging results that were available during my care of the patient were reviewed by me and considered in my medical decision making (see chart for details).   This patient came for evaluation of chest discomfort, but when I talk with her it seems like she's had some vomiting and diarrhea and mid epigastric discomfort since last night. Clinically I am most suspicious of GERD/gastritis.  Her pain has been ongoing all day, and although her EKG is nonspecific, her troponin is negative. I'm not suspicious of acute coronary syndrome.  Labs are reassuring.  After GI cocktail, patient reports improvement and ready to go home.    CONSULTATIONS:  None   Patient / Family / Caregiver informed of clinical course, medical decision-making process, and agree with plan.   I discussed return precautions, follow-up instructions, and discharged instructions with patient and/or family.   ___________________________________________   FINAL CLINICAL IMPRESSION(S) / ED DIAGNOSES   Final diagnoses:  Nausea vomiting and diarrhea  Gastroesophageal reflux disease, esophagitis  presence not specified  Chest pain, unspecified chest  pain type              Note: This dictation was prepared with Dragon dictation. Any transcriptional errors that result from this process are unintentional   Governor Rooks, MD 05/31/15 1701

## 2015-05-31 NOTE — ED Notes (Signed)
Pt arrived by EMS from home with c/o of chest pain at a 10. EMS gave 324 of aspirin and 2x nitro spray and pt states pain decreased to 9. Upon arrival pt states pain continues to be a 9. EMS 12 lead NSR but pt has significant cardiac history.

## 2015-05-31 NOTE — ED Notes (Signed)
Pt verbalized understanding of discharge instructions. NAD at this time. 

## 2015-05-31 NOTE — ED Notes (Addendum)
[employer] called for patient ride home.

## 2015-05-31 NOTE — ED Notes (Signed)
Patient transported to X-ray 

## 2015-05-31 NOTE — Discharge Instructions (Signed)
You were evaluated for vomiting and diarrhea with upper abdominal and chest discomfort which I suspect is due to acid reflux.  You examine evaluation are reassuring in the emergency department today.  For symptom control you may try over-the-counter Maalox or Tums. Return to emergency however any new or worsening condition including worsening chest pain, black or bloody stools, trouble breathing, concern for dehydration, dizziness or passing out or any other symptoms concerning to you.  I recommend seeing your primary doctor this week, and a cardiologist for atypical chest pain - call Monday for appointment -- Dr. Milta DeitersKhan's office number is provided.   Gastroesophageal Reflux Disease, Adult Normally, food travels down the esophagus and stays in the stomach to be digested. However, when a person has gastroesophageal reflux disease (GERD), food and stomach acid move back up into the esophagus. When this happens, the esophagus becomes sore and inflamed. Over time, GERD can create small holes (ulcers) in the lining of the esophagus.  CAUSES This condition is caused by a problem with the muscle between the esophagus and the stomach (lower esophageal sphincter, or LES). Normally, the LES muscle closes after food passes through the esophagus to the stomach. When the LES is weakened or abnormal, it does not close properly, and that allows food and stomach acid to go back up into the esophagus. The LES can be weakened by certain dietary substances, medicines, and medical conditions, including:  Tobacco use.  Pregnancy.  Having a hiatal hernia.  Heavy alcohol use.  Certain foods and beverages, such as coffee, chocolate, onions, and peppermint. RISK FACTORS This condition is more likely to develop in:  People who have an increased body weight.  People who have connective tissue disorders.  People who use NSAID medicines. SYMPTOMS Symptoms of this condition include:  Heartburn.  Difficult or  painful swallowing.  The feeling of having a lump in the throat.  Abitter taste in the mouth.  Bad breath.  Having a large amount of saliva.  Having an upset or bloated stomach.  Belching.  Chest pain.  Shortness of breath or wheezing.  Ongoing (chronic) cough or a night-time cough.  Wearing away of tooth enamel.  Weight loss. Different conditions can cause chest pain. Make sure to see your health care provider if you experience chest pain. DIAGNOSIS Your health care provider will take a medical history and perform a physical exam. To determine if you have mild or severe GERD, your health care provider may also monitor how you respond to treatment. You may also have other tests, including:  An endoscopy toexamine your stomach and esophagus with a small camera.  A test thatmeasures the acidity level in your esophagus.  A test thatmeasures how much pressure is on your esophagus.  A barium swallow or modified barium swallow to show the shape, size, and functioning of your esophagus. TREATMENT The goal of treatment is to help relieve your symptoms and to prevent complications. Treatment for this condition may vary depending on how severe your symptoms are. Your health care provider may recommend:  Changes to your diet.  Medicine.  Surgery. HOME CARE INSTRUCTIONS Diet  Follow a diet as recommended by your health care provider. This may involve avoiding foods and drinks such as:  Coffee and tea (with or without caffeine).  Drinks that containalcohol.  Energy drinks and sports drinks.  Carbonated drinks or sodas.  Chocolate and cocoa.  Peppermint and mint flavorings.  Garlic and onions.  Horseradish.  Spicy and acidic foods, including peppers,  chili powder, curry powder, vinegar, hot sauces, and barbecue sauce.  Citrus fruit juices and citrus fruits, such as oranges, lemons, and limes.  Tomato-based foods, such as red sauce, chili, salsa, and pizza  with red sauce.  Fried and fatty foods, such as donuts, french fries, potato chips, and high-fat dressings.  High-fat meats, such as hot dogs and fatty cuts of red and white meats, such as rib eye steak, sausage, ham, and bacon.  High-fat dairy items, such as whole milk, butter, and cream cheese.  Eat small, frequent meals instead of large meals.  Avoid drinking large amounts of liquid with your meals.  Avoid eating meals during the 2-3 hours before bedtime.  Avoid lying down right after you eat.  Do not exercise right after you eat. General Instructions  Pay attention to any changes in your symptoms.  Take over-the-counter and prescription medicines only as told by your health care provider. Do not take aspirin, ibuprofen, or other NSAIDs unless your health care provider told you to do so.  Do not use any tobacco products, including cigarettes, chewing tobacco, and e-cigarettes. If you need help quitting, ask your health care provider.  Wear loose-fitting clothing. Do not wear anything tight around your waist that causes pressure on your abdomen.  Raise (elevate) the head of your bed 6 inches (15cm).  Try to reduce your stress, such as with yoga or meditation. If you need help reducing stress, ask your health care provider.  If you are overweight, reduce your weight to an amount that is healthy for you. Ask your health care provider for guidance about a safe weight loss goal.  Keep all follow-up visits as told by your health care provider. This is important. SEEK MEDICAL CARE IF:  You have new symptoms.  You have unexplained weight loss.  You have difficulty swallowing, or it hurts to swallow.  You have wheezing or a persistent cough.  Your symptoms do not improve with treatment.  You have a hoarse voice. SEEK IMMEDIATE MEDICAL CARE IF:  You have pain in your arms, neck, jaw, teeth, or back.  You feel sweaty, dizzy, or light-headed.  You have chest pain or  shortness of breath.  You vomit and your vomit looks like blood or coffee grounds.  You faint.  Your stool is bloody or black.  You cannot swallow, drink, or eat.   This information is not intended to replace advice given to you by your health care provider. Make sure you discuss any questions you have with your health care provider.   Document Released: 10/21/2004 Document Revised: 10/02/2014 Document Reviewed: 05/08/2014 Elsevier Interactive Patient Education 2016 [employer]  Nonspecific Chest Pain It is often hard to find the cause of chest pain. There is always a chance that your pain could be related to something serious, such as a heart attack or a blood clot in your lungs. Chest pain can also be caused by conditions that are not life-threatening. If you have chest pain, it is very important to follow up with your doctor.  HOME CARE  If you were prescribed an antibiotic medicine, finish it all even if you start to feel better.  Avoid any activities that cause chest pain.  Do not use any tobacco products, including cigarettes, chewing tobacco, or electronic cigarettes. If you need help quitting, ask your doctor.  Do not drink alcohol.  Take medicines only as told by your doctor.  Keep all follow-up visits as told by your doctor. This is  important. This includes any further testing if your chest pain does not go away.  Your doctor may tell you to keep your head raised (elevated) while you sleep.  Make lifestyle changes as told by your doctor. These may include:  Getting regular exercise. Ask your doctor to suggest some activities that are safe for you.  Eating a heart-healthy diet. Your doctor or a diet specialist (dietitian) can help you to learn healthy eating options.  Maintaining a healthy weight.  Managing diabetes, if necessary.  Reducing stress. GET HELP IF:  Your chest pain does not go away, even after treatment.  You have a rash with blisters on your  chest.  You have a fever. GET HELP RIGHT AWAY IF:  Your chest pain is worse.  You have an increasing cough, or you cough up blood.  You have severe belly (abdominal) pain.  You feel extremely weak.  You pass out (faint).  You have chills.  You have sudden, unexplained chest discomfort.  You have sudden, unexplained discomfort in your arms, back, neck, or jaw.  You have shortness of breath at any time.  You suddenly start to sweat, or your skin gets clammy.  You feel nauseous.  You vomit.  You suddenly feel light-headed or dizzy.  Your heart begins to beat quickly, or it feels like it is skipping beats. These symptoms may be an emergency. Do not wait to see if the symptoms will go away. Get medical help right away. Call your local emergency services (911 in the [place] Do not drive yourself to the hospital.   This information is not intended to replace advice given to you by your health care provider. Make sure you discuss any questions you have with your health care provider.   Document Released: 06/30/2007 Document Revised: 02/01/2014 Document Reviewed: 08/17/2013 Elsevier Interactive Patient Education [employer]

## 2015-06-05 ENCOUNTER — Ambulatory Visit (INDEPENDENT_AMBULATORY_CARE_PROVIDER_SITE_OTHER): Payer: Medicaid Other

## 2015-06-05 DIAGNOSIS — R1030 Lower abdominal pain, unspecified: Secondary | ICD-10-CM

## 2015-06-10 ENCOUNTER — Other Ambulatory Visit: Payer: Self-pay | Admitting: Gastroenterology

## 2015-06-10 DIAGNOSIS — R1310 Dysphagia, unspecified: Secondary | ICD-10-CM

## 2015-06-16 ENCOUNTER — Ambulatory Visit
Admission: RE | Admit: 2015-06-16 | Discharge: 2015-06-16 | Disposition: A | Discharge status: Home / Self Care | Payer: Medicaid Other | Source: Ambulatory Visit | Attending: Gastroenterology | Admitting: Gastroenterology

## 2015-06-16 DIAGNOSIS — K228 Other specified diseases of esophagus: Secondary | ICD-10-CM | POA: Diagnosis not present

## 2015-06-16 DIAGNOSIS — R1310 Dysphagia, unspecified: Secondary | ICD-10-CM | POA: Diagnosis not present

## 2015-07-02 ENCOUNTER — Encounter: Payer: Self-pay | Admitting: Family Medicine

## 2015-07-02 ENCOUNTER — Ambulatory Visit (INDEPENDENT_AMBULATORY_CARE_PROVIDER_SITE_OTHER): Payer: Medicaid Other | Admitting: Family Medicine

## 2015-07-02 VITALS — BP 142/72 | HR 80 | Temp 98.0°F | Resp 16 | Wt 301.0 lb

## 2015-07-02 DIAGNOSIS — E119 Type 2 diabetes mellitus without complications: Secondary | ICD-10-CM

## 2015-07-02 MED ORDER — METFORMIN HCL 1000 MG PO TABS
1000.0000 mg | ORAL_TABLET | Freq: Two times a day (BID) | ORAL | Status: DC
Start: 2015-07-02 — End: 2015-07-28

## 2015-07-02 NOTE — Progress Notes (Signed)
Patient ID: Matilde BashMinnie B Celmer, female   DOB: 1955/02/09, 60 y.o.   MRN: [id number]       Patient: Matilde BashMinnie B Vinsant Female    DOB: 1955/02/09   60 y.o.   MRN: [id number] Visit Date: 07/02/2015  Today's Provider: Megan Mansichard  Jr, MD   Chief Complaint  Patient presents with  . Hospitalization Follow-up  . Diabetes   Subjective:    HPI Hospital Follow Up:  Patient was seen in the ER on 05/31/2014 due to vomiting and diarrhea. She reports that it was an excerbation of her acid reflux. Patient reports that she was given IV fluids and all other labs and tests with exception of her blood sugar was WNL.    Diabetes Mellitus Type II, Follow-up:   Lab Results  Component Value Date   HGBA1C 8.3 02/10/2015   HGBA1C 8.9 08/08/2014   HGBA1C 11.5* 05/06/2014    Last seen for diabetes 6 months ago.  Management since then includes no changes. She reports fair compliance with treatment. She is not having side effects.  Current symptoms include hyperglycemia, nausea and polyuria and have been stable. Home blood sugar records: trend: fluctuating a bit. Patient reports that blood sugars average between 260s-300s  Episodes of hypoglycemia? no  Weight trend: stable Current diet: well balanced Current exercise: none  Pertinent Labs:    Component Value Date/Time   CHOL 182 04/15/2015 1205   CHOL 235* 08/30/2012 0531   TRIG 215* 04/15/2015 1205   TRIG 131 08/30/2012 0531   HDL 46 04/15/2015 1205   HDL 57 08/30/2012 0531   LDLCALC 93 04/15/2015 1205   LDLCALC 152* 08/30/2012 0531   CREATININE 0.75 05/31/2015 1609   CREATININE 0.8 05/06/2014   CREATININE 0.84 03/16/2013 1256    Wt Readings from Last 3 Encounters:  07/02/15 301 lb (136.533 kg)  05/31/15 312 lb (141.522 kg)  05/27/15 305 lb (138.347 kg)       Allergies  Allergen Reactions  . Cephalexin Itching  . Peroxide [Hydrogen Peroxide] Swelling  . Shellfish Allergy Nausea And Vomiting   No outpatient prescriptions have been  marked as taking for the 07/02/15 encounter (Office Visit) with Maple Hudsonichard L  Jr., MD.    Review of Systems  Constitutional: Negative.   Respiratory: Negative.   Cardiovascular: Negative.   Gastrointestinal: Positive for nausea, vomiting, abdominal pain and diarrhea. Negative for constipation, blood in stool, abdominal distention, anal bleeding and rectal pain.  Musculoskeletal: Positive for arthralgias.  Neurological: Positive for tremors.    Social History  Substance Use Topics  . Smoking status: Current Some Day Smoker -- 0.25 packs/day    Types: Cigarettes  . Smokeless tobacco: Never Used  . Alcohol Use: No   Objective:   BP 142/72 mmHg  Pulse 80  Temp(Src) 98 F (36.7 C)  Resp 16  Wt 301 lb (136.533 kg)  Physical Exam  Constitutional: She is oriented to person, place, and time. She appears well-developed and well-nourished.  HENT:  Head: Normocephalic and atraumatic.  Right Ear: External ear normal.  Left Ear: External ear normal.  Nose: Nose normal.  Eyes: Conjunctivae are normal.  Neck: Neck supple.  Cardiovascular: Normal rate, regular rhythm and normal heart sounds.   Pulmonary/Chest: Effort normal and breath sounds normal.  Abdominal: Soft. Bowel sounds are normal. There is tenderness.  Genitourinary: No vaginal discharge found.  Patient has a erythematous ingrown hair follicle located on the right lateral aspect of her labia.     Neurological: She is alert  and oriented to person, place, and time.  Skin: Skin is warm and dry.  Psychiatric: She has a normal mood and affect. Her behavior is normal. Judgment and thought content normal.        Assessment & Plan:     1. Type 2 diabetes mellitus without complication, unspecified long term insulin use status (HCC) Worsening. HgbA1c today was 10.9. Start Metformin as below. Advised patient that she will have diarrhea while taking medication. However, the symptoms should not last long. Will recheck in 3-4 months  for stability.  - metFORMIN (GLUCOPHAGE) 1000 MG tablet; Take 1 tablet (1,000 mg total) by mouth 2 (two) times daily with a meal.  Dispense: 180 tablet; Refill: 3 2. Schizophrenia A caregiver from [facility] is with the patient on her visit today and is very attentive to her needs. This lady has been helping the patient for probably more than 25 years overall. More than 50% of this visit is spent in counseling with the patient and the representative from [facility]. 3. Obesity  Weight loss is discussed with patient 4. Chronic osteoarthritis and associated chronic pain 5. Chronic abdominal pain This is been just with patient and her caregiver and she has had complete workup several times over the past several years. In 2017 patient has had ultrasound labs and CT of the abdomen and pelvis.      Seymore Brodowski Wendelyn Breslow, MD  [facility]

## 2015-07-07 ENCOUNTER — Other Ambulatory Visit: Payer: Self-pay | Admitting: Family Medicine

## 2015-07-07 ENCOUNTER — Other Ambulatory Visit: Payer: Self-pay

## 2015-07-07 ENCOUNTER — Telehealth: Payer: Self-pay | Admitting: Family Medicine

## 2015-07-07 DIAGNOSIS — Z1239 Encounter for other screening for malignant neoplasm of breast: Secondary | ICD-10-CM

## 2015-07-07 MED ORDER — LORATADINE 10 MG PO TABS: 10.0000 mg | ORAL_TABLET | Freq: Every day | ORAL | Status: DC

## 2015-07-07 MED ORDER — TRIAMTERENE-HCTZ 37.5-25 MG PO TABS: 1.0000 | ORAL_TABLET | Freq: Every day | ORAL | Status: DC

## 2015-07-07 MED ORDER — POTASSIUM CHLORIDE CRYS ER 20 MEQ PO TBCR: 20.0000 meq | EXTENDED_RELEASE_TABLET | Freq: Two times a day (BID) | ORAL | Status: DC

## 2015-07-07 NOTE — Telephone Encounter (Signed)
Ok to order 

## 2015-07-07 NOTE — Telephone Encounter (Signed)
Pt is due for mammo and would like to have it set up.  They will need 3 days in advance in order to get transportation.  They did try and call themselves but said b/c she has medicaid we would have to call and set up.

## 2015-07-08 NOTE — Telephone Encounter (Signed)
Appointment made for Wednesday June 28 th @ 1:20pm. Caregiver informed.

## 2015-07-16 ENCOUNTER — Other Ambulatory Visit: Payer: Self-pay | Admitting: Family Medicine

## 2015-07-16 DIAGNOSIS — B3731 Acute candidiasis of vulva and vagina: Secondary | ICD-10-CM

## 2015-07-16 DIAGNOSIS — N898 Other specified noninflammatory disorders of vagina: Secondary | ICD-10-CM

## 2015-07-16 DIAGNOSIS — B373 Candidiasis of vulva and vagina: Secondary | ICD-10-CM

## 2015-07-16 MED ORDER — TERCONAZOLE 0.8 % VA CREA: 1.0000 | TOPICAL_CREAM | Freq: Every day | VAGINAL | Status: DC

## 2015-07-16 NOTE — Telephone Encounter (Signed)
Ok--2 rf.

## 2015-07-16 NOTE — Telephone Encounter (Signed)
Pt needing refill on her terconazole (TERAZOL 3) 0.8 % vaginal cream  She uses [facility]  Please ask for the Rx to be delivered today if possible.  Thanks,teri

## 2015-07-16 NOTE — Telephone Encounter (Signed)
Pt informed. Med sent to pharmacy.  

## 2015-07-16 NOTE — Telephone Encounter (Signed)
Please review-aa 

## 2015-07-23 ENCOUNTER — Ambulatory Visit
Admission: RE | Admit: 2015-07-23 | Discharge: 2015-07-23 | Disposition: A | Discharge status: Home / Self Care | Payer: Medicaid Other | Source: Ambulatory Visit | Attending: Family Medicine | Admitting: Family Medicine

## 2015-07-23 ENCOUNTER — Telehealth: Payer: Self-pay | Admitting: Family Medicine

## 2015-07-23 DIAGNOSIS — Z1231 Encounter for screening mammogram for malignant neoplasm of breast: Secondary | ICD-10-CM | POA: Insufficient documentation

## 2015-07-23 DIAGNOSIS — Z1239 Encounter for other screening for malignant neoplasm of breast: Secondary | ICD-10-CM

## 2015-07-23 MED ORDER — FLUTICASONE PROPIONATE 50 MCG/ACT NA SUSP: 2.0000 | Freq: Every day | NASAL | Status: DC

## 2015-07-23 NOTE — Telephone Encounter (Signed)
Pt states she is having sinus congestion and is asking if we will call in a nasal spray to help with this.  [facility].  CB#[phone]/MW

## 2015-07-23 NOTE — Telephone Encounter (Signed)
Fluticasone nasal spray--2 sprays per nostril daily.

## 2015-07-23 NOTE — Telephone Encounter (Signed)
Please review-aa 

## 2015-07-23 NOTE — Telephone Encounter (Signed)
DONE-AA 

## 2015-07-28 ENCOUNTER — Telehealth: Payer: Self-pay | Admitting: Family Medicine

## 2015-07-28 ENCOUNTER — Other Ambulatory Visit: Payer: Self-pay

## 2015-07-28 DIAGNOSIS — E119 Type 2 diabetes mellitus without complications: Secondary | ICD-10-CM

## 2015-07-28 MED ORDER — METFORMIN HCL 500 MG PO TABS: 500.0000 mg | ORAL_TABLET | Freq: Two times a day (BID) | ORAL | Status: DC

## 2015-07-28 NOTE — Telephone Encounter (Signed)
Spoke with patient and pharmacy, the patient gets her medicine put in a blister pack of all the meds she takes at each dosing time.  They are not in individual blister packs.  The patient needs to bring the blister card in to the pharmacy and they will fix it with 500 mg pills in the place of the 1000 mg pills.

## 2015-07-28 NOTE — Telephone Encounter (Signed)
Pt is having upset stomach causes by the metformin.  Pt is wanting to know is she can take a lower dose.  Pt checked sugar 07/25/15 and it was 120.

## 2015-07-28 NOTE — Telephone Encounter (Signed)
Please advise 

## 2015-07-28 NOTE — Telephone Encounter (Signed)
Okay to go from 1000 mg twice a day to 500 mg twice a day

## 2015-08-13 ENCOUNTER — Other Ambulatory Visit: Payer: Self-pay

## 2015-08-13 MED ORDER — ACCU-CHEK SOFTCLIX LANCET DEV MISC: Status: AC

## 2015-08-13 MED ORDER — GLUCOSE BLOOD VI STRP: ORAL_STRIP | Status: DC

## 2015-08-14 ENCOUNTER — Other Ambulatory Visit: Payer: Self-pay | Admitting: Family Medicine

## 2015-08-14 DIAGNOSIS — N898 Other specified noninflammatory disorders of vagina: Secondary | ICD-10-CM

## 2015-08-14 DIAGNOSIS — B3731 Acute candidiasis of vulva and vagina: Secondary | ICD-10-CM

## 2015-08-14 DIAGNOSIS — B373 Candidiasis of vulva and vagina: Secondary | ICD-10-CM

## 2015-08-14 NOTE — Telephone Encounter (Signed)
Pt contacted office for refill request on the following medications:  terconazole (TERAZOL 3) 0.8 % vaginal cream.  [facility].  CB#[phone]/MW

## 2015-08-14 NOTE — Telephone Encounter (Signed)
Please review-aa 

## 2015-08-15 ENCOUNTER — Ambulatory Visit: Payer: Medicaid Other | Admitting: Anesthesiology

## 2015-08-15 ENCOUNTER — Encounter
Admission: RE | Disposition: A | Discharge status: Home / Self Care | Payer: Self-pay | Source: Ambulatory Visit | Attending: Gastroenterology

## 2015-08-15 ENCOUNTER — Encounter: Payer: Self-pay | Admitting: Anesthesiology

## 2015-08-15 ENCOUNTER — Ambulatory Visit
Admission: RE | Admit: 2015-08-15 | Discharge: 2015-08-15 | Disposition: A | Discharge status: Home / Self Care | Payer: Medicaid Other | Source: Ambulatory Visit | Attending: Gastroenterology | Admitting: Gastroenterology

## 2015-08-15 DIAGNOSIS — B009 Herpesviral infection, unspecified: Secondary | ICD-10-CM | POA: Diagnosis not present

## 2015-08-15 DIAGNOSIS — K3189 Other diseases of stomach and duodenum: Secondary | ICD-10-CM | POA: Insufficient documentation

## 2015-08-15 DIAGNOSIS — R1031 Right lower quadrant pain: Secondary | ICD-10-CM | POA: Diagnosis present

## 2015-08-15 DIAGNOSIS — Q438 Other specified congenital malformations of intestine: Secondary | ICD-10-CM | POA: Diagnosis not present

## 2015-08-15 DIAGNOSIS — Z79899 Other long term (current) drug therapy: Secondary | ICD-10-CM | POA: Diagnosis not present

## 2015-08-15 DIAGNOSIS — Z7984 Long term (current) use of oral hypoglycemic drugs: Secondary | ICD-10-CM | POA: Insufficient documentation

## 2015-08-15 DIAGNOSIS — M1990 Unspecified osteoarthritis, unspecified site: Secondary | ICD-10-CM | POA: Insufficient documentation

## 2015-08-15 DIAGNOSIS — K2950 Unspecified chronic gastritis without bleeding: Secondary | ICD-10-CM | POA: Insufficient documentation

## 2015-08-15 DIAGNOSIS — I10 Essential (primary) hypertension: Secondary | ICD-10-CM | POA: Diagnosis not present

## 2015-08-15 DIAGNOSIS — F209 Schizophrenia, unspecified: Secondary | ICD-10-CM | POA: Diagnosis not present

## 2015-08-15 DIAGNOSIS — R1310 Dysphagia, unspecified: Secondary | ICD-10-CM | POA: Diagnosis not present

## 2015-08-15 DIAGNOSIS — E119 Type 2 diabetes mellitus without complications: Secondary | ICD-10-CM | POA: Diagnosis not present

## 2015-08-15 DIAGNOSIS — R112 Nausea with vomiting, unspecified: Secondary | ICD-10-CM | POA: Diagnosis not present

## 2015-08-15 DIAGNOSIS — E785 Hyperlipidemia, unspecified: Secondary | ICD-10-CM | POA: Insufficient documentation

## 2015-08-15 DIAGNOSIS — J45909 Unspecified asthma, uncomplicated: Secondary | ICD-10-CM | POA: Diagnosis not present

## 2015-08-15 DIAGNOSIS — R1011 Right upper quadrant pain: Secondary | ICD-10-CM | POA: Insufficient documentation

## 2015-08-15 DIAGNOSIS — Z7982 Long term (current) use of aspirin: Secondary | ICD-10-CM | POA: Diagnosis not present

## 2015-08-15 DIAGNOSIS — K219 Gastro-esophageal reflux disease without esophagitis: Secondary | ICD-10-CM | POA: Insufficient documentation

## 2015-08-15 HISTORY — PX: COLONOSCOPY WITH PROPOFOL: SHX5780

## 2015-08-15 HISTORY — PX: ESOPHAGOGASTRODUODENOSCOPY (EGD) WITH PROPOFOL: SHX5813

## 2015-08-15 LAB — GLUCOSE, CAPILLARY: GLUCOSE-CAPILLARY: 165 mg/dL — AB (ref 65–99)

## 2015-08-15 LAB — SURGICAL PATHOLOGY

## 2015-08-15 LAB — HM COLONOSCOPY

## 2015-08-15 SURGERY — COLONOSCOPY WITH PROPOFOL
Anesthesia: General

## 2015-08-15 MED ORDER — MIDAZOLAM HCL 5 MG/5ML IJ SOLN: INTRAMUSCULAR | Status: DC | PRN

## 2015-08-15 MED ORDER — SODIUM CHLORIDE 0.9 % IV SOLN
INTRAVENOUS | Status: DC
Start: 2015-08-15 — End: 2015-08-15

## 2015-08-15 MED ORDER — LIDOCAINE 2% (20 MG/ML) 5 ML SYRINGE
INTRAMUSCULAR | Status: DC | PRN
  Administered 2015-08-15: 40 mg via INTRAVENOUS

## 2015-08-15 MED ORDER — PHENYLEPHRINE HCL 10 MG/ML IJ SOLN
INTRAMUSCULAR | Status: DC | PRN
  Administered 2015-08-15: 100 ug via INTRAVENOUS

## 2015-08-15 MED ORDER — PROPOFOL 500 MG/50ML IV EMUL
INTRAVENOUS | Status: DC | PRN
  Administered 2015-08-15: 150 ug/kg/min via INTRAVENOUS
  Administered 2015-08-15: 100 ug/kg/min via INTRAVENOUS

## 2015-08-15 MED ORDER — SODIUM CHLORIDE 0.9 % IV SOLN
INTRAVENOUS | Status: DC
  Administered 2015-08-15 (×2): via INTRAVENOUS

## 2015-08-15 MED ORDER — PROPOFOL 10 MG/ML IV BOLUS
INTRAVENOUS | Status: DC | PRN
  Administered 2015-08-15: 10 mg via INTRAVENOUS
  Administered 2015-08-15: 100 mg via INTRAVENOUS
  Administered 2015-08-15 (×2): 20 mg via INTRAVENOUS

## 2015-08-15 MED ORDER — FENTANYL CITRATE (PF) 100 MCG/2ML IJ SOLN
INTRAMUSCULAR | Status: DC | PRN
  Administered 2015-08-15: 50 ug via INTRAVENOUS

## 2015-08-15 MED ORDER — TERCONAZOLE 0.8 % VA CREA
1.0000 | TOPICAL_CREAM | Freq: Every day | VAGINAL | Status: DC
Start: 2015-08-15 — End: 2015-10-06

## 2015-08-15 NOTE — Telephone Encounter (Signed)
1rf only. 

## 2015-08-15 NOTE — Transfer of Care (Signed)
Immediate Anesthesia Transfer of Care Note  Patient: Christy [name]  Procedure(s) Performed: Procedure(s): COLONOSCOPY WITH PROPOFOL (N/A) ESOPHAGOGASTRODUODENOSCOPY (EGD) WITH PROPOFOL (N/A)  Patient Location: PACU and Endoscopy Unit  Anesthesia Type:General  Level of Consciousness: awake, oriented and patient cooperative  Airway & Oxygen Therapy: Patient Spontanous Breathing and Patient connected to face mask oxygen  Post-op Assessment: Report given to RN and Post -op Vital signs reviewed and stable  Post vital signs: Reviewed and stable  Last Vitals:  Filed Vitals:   08/15/15 0758  BP: 139/104  Pulse: 94  Temp: 36.2 C  Resp: 20    Last Pain: There were no vitals filed for this visit.       Complications: No apparent anesthesia complications

## 2015-08-15 NOTE — Anesthesia Postprocedure Evaluation (Signed)
Anesthesia Post Note  Patient: Christy BashMinnie B [name]  Procedure(s) Performed: Procedure(s) (LRB): COLONOSCOPY WITH PROPOFOL (N/A) ESOPHAGOGASTRODUODENOSCOPY (EGD) WITH PROPOFOL (N/A)  Patient location during evaluation: Endoscopy Anesthesia Type: General Level of consciousness: awake and alert Pain management: pain level controlled Vital Signs Assessment: post-procedure vital signs reviewed and stable Respiratory status: spontaneous breathing, nonlabored ventilation, respiratory function stable and patient connected to nasal cannula oxygen Cardiovascular status: blood pressure returned to baseline and stable Postop Assessment: no signs of nausea or vomiting Anesthetic complications: no    Last Vitals:  Filed Vitals:   08/15/15 1050 08/15/15 1100  BP: 123/80 113/78  Pulse: 79 79  Temp:    Resp: 25 25    Last Pain: There were no vitals filed for this visit.               Melton Walls S

## 2015-08-15 NOTE — Anesthesia Preprocedure Evaluation (Signed)
Anesthesia Evaluation  Patient identified by MRN, date of birth, ID band Patient awake    Reviewed: Allergy & Precautions, NPO status , Patient's Chart, lab work & pertinent test results, reviewed documented beta blocker date and time   Airway Mallampati: III  TM Distance: >3 FB     Dental  (+) Chipped   Pulmonary asthma , Current Smoker,           Cardiovascular hypertension, Pt. on medications +CHF       Neuro/Psych PSYCHIATRIC DISORDERS Schizophrenia    GI/Hepatic GERD  ,  Endo/Other  diabetes, Type obesity  Renal/GU      Musculoskeletal  (+) Arthritis ,   Abdominal   Peds  Hematology   Anesthesia Other Findings   Reproductive/Obstetrics                             Anesthesia Physical Anesthesia Plan  ASA: III  Anesthesia Plan: General   Post-op Pain Management:    Induction: Intravenous  Airway Management Planned: Nasal Cannula  Additional Equipment:   Intra-op Plan:   Post-operative Plan:   Informed Consent: I have reviewed the patients History and Physical, chart, labs and discussed the procedure including the risks, benefits and alternatives for the proposed anesthesia with the patient or authorized representative who has indicated his/her understanding and acceptance.     Plan Discussed with: CRNA  Anesthesia Plan Comments:         Anesthesia Quick Evaluation

## 2015-08-15 NOTE — Telephone Encounter (Signed)
[facility]   Thanks,   -Vernona RiegerLaura

## 2015-08-15 NOTE — H&P (Signed)
Outpatient short stay form Pre-procedure 08/15/2015 9:12 AM Christena Deem MD  Primary Physician: Dr. Julieanne Manson  Reason for visit:  EGD and colonoscopy  History of present illness:  Patient is a 60 year old female presenting today for procedures as above. As a history of chronic right lower quadrant pain. There is some dysphagia this being cervical in nature. She has a normal barium swallow with the exception of delay in initiation of the primary swallowing maneuver. She also has some intermittent nausea and vomiting. She is on a number of medications including fluphenazine, gabapentin, meloxicam, Cogentin, pantoprazole. She does take 81 mg aspirin but has been held. sHe takes no anticoagulation medications.    Current facility-administered medications:  .  0.9 %  sodium chloride infusion, , Intravenous, Continuous, Christena Deem, MD, Last Rate: 20 mL/hr at 08/15/15 0817 .  0.9 %  sodium chloride infusion, , Intravenous, Continuous, Christena Deem, MD  Prescriptions prior to admission  Medication Sig Dispense Refill Last Dose  . aspirin EC 81 MG tablet Take 81 mg by mouth daily.   Past Week at Unknown time  . triamterene-hydrochlorothiazide (MAXZIDE-25) 37.5-25 MG tablet Take 1 tablet by mouth daily. 30 tablet 12 08/14/2015 at Unknown time  . valACYclovir (VALTREX) 1000 MG tablet Take 1 tablet (1,000 mg total) by mouth 2 (two) times daily. 30 tablet 12 08/14/2015 at Unknown time  . albuterol (PROVENTIL HFA;VENTOLIN HFA) 108 (90 Base) MCG/ACT inhaler Inhale 1-2 puffs into the lungs every 6 (six) hours as needed for wheezing or shortness of breath. 8 Inhaler 6 Taking  . benztropine (COGENTIN) 0.5 MG tablet Take 0.5 mg by mouth 2 (two) times daily. One tab in the morning and 2 tabs at bedtime   Taking  . docusate sodium (COLACE) 100 MG capsule Take 100 mg by mouth 2 (two) times daily.   Taking  . FluPHENAZine HCl (PROLIXIN PO) Take 25 mg by mouth. injection   Taking  . fluticasone  (FLONASE) 50 MCG/ACT nasal spray Place 2 sprays into both nostrils daily. 16 g 6   . furosemide (LASIX) 40 MG tablet Take 1 tablet (40 mg total) by mouth daily. 30 tablet 12 Taking  . gabapentin (NEURONTIN) 300 MG capsule Take 300 mg by mouth 3 (three) times daily.   Taking  . glimepiride (AMARYL) 4 MG tablet Take 1 tablet (4 mg total) by mouth daily with breakfast. 30 tablet 12 Taking  . glucose blood (ACCU-CHEK AVIVA) test strip Check sugar once daily DX E11.9 50 each 12   . Lancet Devices (ACCU-CHEK SOFTCLIX) lancets Check sugar once daily, DX E11.9 50 each 12   . loratadine (CLARITIN) 10 MG tablet Take 1 tablet (10 mg total) by mouth daily. 30 tablet 12   . metFORMIN (GLUCOPHAGE) 500 MG tablet Take 1 tablet (500 mg total) by mouth 2 (two) times daily with a meal. 180 tablet 3   . ondansetron (ZOFRAN) 4 MG tablet Take 1 tablet (4 mg total) by mouth every 8 (eight) hours as needed for nausea or vomiting. 55 tablet 0 Taking  . pantoprazole (PROTONIX) 40 MG tablet Take 40 mg by mouth daily.   Taking  . potassium chloride SA (K-DUR,KLOR-CON) 20 MEQ tablet Take 1 tablet (20 mEq total) by mouth 2 (two) times daily. 60 tablet 12   . pregabalin (LYRICA) 50 MG capsule Take 50 mg by mouth 3 (three) times daily.   Taking  . ranitidine (ZANTAC) 150 MG tablet Take 1 tablet (150 mg total) by mouth  2 (two) times daily. 60 tablet 12 Taking  . simvastatin (ZOCOR) 10 MG tablet Take 1 tablet (10 mg total) by mouth daily. 30 tablet 12 Taking  . sucralfate (CARAFATE) 1 g tablet Take 1 tablet (1 g total) by mouth 4 (four) times daily -  with meals and at bedtime. 120 tablet 12 Taking  . terconazole (TERAZOL 3) 0.8 % vaginal cream Place 1 applicator vaginally at bedtime. 20 g 2      Allergies  Allergen Reactions  . Cephalexin Itching  . Peroxide [Hydrogen Peroxide] Swelling  . Shellfish Allergy Nausea And Vomiting     Past Medical History  Diagnosis Date  . Hypertension   . Arthritis   . Malaise and  fatigue   . Hyperlipidemia   . Mental status alteration   . Edema   . Schizophrenia (HCC)   . Diabetes mellitus without complication [facility](HCC)     Patient takes Metformin  . GERD (gastroesophageal reflux disease)   . Genital herpes   . Asthma     Review of systems:      Physical Exam    Heart and lungs: Regular rate and rhythm without rub or gallop, lungs are bilaterally clear.    HEENT: Normocephalic atraumatic eyes are anicteric    Other:     Pertinant exam for procedure: Soft, tender to palpation in the right lower right upper quadrant. nondistended bowel sounds positive normoactive. Obese. No rebound.    Planned proceedures: EGD and colonoscopy with indicated procedures. I have discussed the risks benefits and complications of procedures to include not limited to bleeding, infection, perforation and the risk of sedation and the patient wishes to proceed.    Christena DeemMartin U Skulskie, MD Gastroenterology 08/15/2015  9:12 AM

## 2015-08-15 NOTE — Op Note (Signed)
[facility] Gastroenterology Patient Name: Christy ChildMinnie Kille Procedure Date: 08/15/2015 9:13 AM MRN: [id number] Account #: [id number] Date of Birth: November 29, 1955 Admit Type: Outpatient Age: [age] [facility] 4 Gender: Female Note Status: Finalized Procedure:            Colonoscopy Indications:          Abdominal pain in the right lower quadrant Providers:            Christena DeemMartin U. Faust Thorington, MD Referring MD:         Ferdinand Langoichard L. Sullivan LoneGilbert, MD (Referring MD) Medicines:            Monitored Anesthesia Care Complications:        No immediate complications. Procedure:            Pre-Anesthesia Assessment:                       - ASA Grade Assessment: III - A patient with severe                        systemic disease.                       After obtaining informed consent, the colonoscope was                        passed under direct vision. Throughout the procedure,                        the patient's blood pressure, pulse, and oxygen                        saturations were monitored continuously. The                        Colonoscope was introduced through the anus with the                        intention of advancing to the cecum. The scope was                        advanced to the hepatic flexure before the procedure                        was aborted. Medications were given. The quality of the                        bowel preparation was poor. The colonoscopy was                        unusually difficult due to significant looping, a                        tortuous colon and the patient's body habitus. Despite                        changing the patient to a supine position, changing the                        patient to a prone position, using manual pressure and  lavage I was unable to get to the cecum. Findings:      The colon (entire examined portion) was significantly redundant.      The exam was otherwise without abnormality.      The  digital rectal exam was normal. Pertinent negatives include Note       poor sphincter tone. Impression:           - Preparation of the colon was poor.                       - Redundant colon.                       - The examination was otherwise normal.                       - No specimens collected. Recommendation:       - Perform a CT scan (computed tomography) of abdomen                        with contrast and pelvis with contrast at appointment                        to be scheduled.                       - Return to GI clinic in 4 weeks. Procedure Code(s):    --- Professional ---                       [phone], 53, Colonoscopy, flexible; diagnostic, including                        collection of specimen(s) by brushing or washing, when                        performed (separate procedure) Diagnosis Code(s):    --- Professional ---                       R10.31, Right lower quadrant pain                       Q43.8, Other specified congenital malformations of                        intestine CPT copyright 2016 American Medical Association. All rights reserved. The codes documented in this report are preliminary and upon coder review may  be revised to meet current compliance requirements. Christena Deem, MD 08/15/2015 11:00:04 AM This report has been signed electronically. Number of Addenda: 0 Note Initiated On: 08/15/2015 9:13 AM Scope Withdrawal Time: 0 hours 6 minutes 40 seconds  Total Procedure Duration: 0 hours 36 minutes 4 seconds       [facility]

## 2015-08-15 NOTE — Op Note (Signed)
[facility] Gastroenterology Patient Name: Christy [name] Procedure Date: 08/15/2015 9:13 AM MRN: [id number] Account #: [id number] Date of Birth: 12-28-1955 Admit Type: Outpatient Age: 60 Room: [facility] ENDO ROOM 4 Gender: Female Note Status: Finalized Procedure:            Upper GI endoscopy Indications:          Abdominal pain in the right upper quadrant, Abdominal                        pain in the right lower quadrant, Dysphagia, Nausea                        with vomiting Providers:            Christena Deem, MD Referring MD:         Ferdinand Lango. Sullivan Lone, MD (Referring MD) Medicines:            Monitored Anesthesia Care Complications:        No immediate complications. Procedure:            Pre-Anesthesia Assessment:                       - ASA Grade Assessment: III - A patient with severe                        systemic disease.                       After obtaining informed consent, the endoscope was                        passed under direct vision. Throughout the procedure,                        the patient's blood pressure, pulse, and oxygen                        saturations were monitored continuously. The Endoscope                        was introduced through the mouth, and advanced to the                        third part of duodenum. The upper GI endoscopy was                        accomplished without difficulty. The patient tolerated                        the procedure well. Findings:      The Z-line was variable. Biopsies were taken with a cold forceps for       histology.      The exam of the esophagus was otherwise normal. No evidence of rings,       webs, stenosis or stricture.      Patchy mild inflammation characterized by erythema was found in the       gastric body. Biopsies were taken with a cold forceps for histology.       Biopsies were taken with a cold forceps for Helicobacter pylori testing.      The  cardia and gastric fundus were  normal on retroflexion.      The exam of the stomach was otherwise normal.      The examined duodenum was normal. Impression:           - Z-line variable. Biopsied.                       - Erosive gastritis. Biopsied.                       - Normal examined duodenum. Recommendation:       - Await pathology results.                       - Continue present medications.                       - Perform a modified barium swallow at appointment to                        be scheduled. Procedure Code(s):    --- Professional ---                       [phone], Esophagogastroduodenoscopy, flexible, transoral;                        with biopsy, single or multiple Diagnosis Code(s):    --- Professional ---                       K22.8, Other specified diseases of esophagus                       K29.60, Other gastritis without bleeding                       R10.11, Right upper quadrant pain                       R10.31, Right lower quadrant pain                       R13.10, Dysphagia, unspecified                       R11.2, Nausea with vomiting, unspecified CPT copyright 2016 American Medical Association. All rights reserved. The codes documented in this report are preliminary and upon coder review may  be revised to meet current compliance requirements. Christena DeemMartin U Laylynn Campanella, MD 08/15/2015 9:46:15 AM This report has been signed electronically. Number of Addenda: 0 Note Initiated On: 08/15/2015 9:13 AM      [facility]

## 2015-08-18 ENCOUNTER — Encounter: Payer: Self-pay | Admitting: Gastroenterology

## 2015-09-16 ENCOUNTER — Other Ambulatory Visit: Payer: Self-pay | Admitting: Gastroenterology

## 2015-09-16 DIAGNOSIS — R1013 Epigastric pain: Secondary | ICD-10-CM

## 2015-09-24 ENCOUNTER — Other Ambulatory Visit: Payer: Medicaid Other

## 2015-09-30 ENCOUNTER — Other Ambulatory Visit: Payer: Self-pay | Admitting: Gastroenterology

## 2015-09-30 DIAGNOSIS — R1310 Dysphagia, unspecified: Secondary | ICD-10-CM

## 2015-10-02 ENCOUNTER — Ambulatory Visit: Payer: Medicaid Other | Admitting: Family Medicine

## 2015-10-02 ENCOUNTER — Ambulatory Visit
Admission: RE | Admit: 2015-10-02 | Discharge: 2015-10-02 | Disposition: A | Discharge status: Home / Self Care | Payer: Medicaid Other | Source: Ambulatory Visit | Attending: Gastroenterology | Admitting: Gastroenterology

## 2015-10-02 DIAGNOSIS — R1312 Dysphagia, oropharyngeal phase: Secondary | ICD-10-CM | POA: Insufficient documentation

## 2015-10-02 DIAGNOSIS — R1013 Epigastric pain: Secondary | ICD-10-CM

## 2015-10-02 MED ORDER — TECHNETIUM TC 99M SULFUR COLLOID
2.0000 | Freq: Once | INTRAVENOUS | Status: AC | PRN
  Administered 2015-10-02: 2.15 via INTRAVENOUS

## 2015-10-06 ENCOUNTER — Other Ambulatory Visit: Payer: Self-pay | Admitting: Family Medicine

## 2015-10-06 DIAGNOSIS — B373 Candidiasis of vulva and vagina: Secondary | ICD-10-CM

## 2015-10-06 DIAGNOSIS — B3731 Acute candidiasis of vulva and vagina: Secondary | ICD-10-CM

## 2015-10-06 DIAGNOSIS — N898 Other specified noninflammatory disorders of vagina: Secondary | ICD-10-CM

## 2015-10-06 MED ORDER — TERCONAZOLE 0.8 % VA CREA: 1.0000 | TOPICAL_CREAM | Freq: Every day | VAGINAL | 5 refills | Status: DC

## 2015-10-06 NOTE — Telephone Encounter (Signed)
Pt contacted office for refill request on the following medications:  terconazole (TERAZOL 3) 0.8 % vaginal cream.  [facility].  Pt is requesting this delivered from the pharmacy. CB#[phone]/MW  This is a Dr Sullivan LoneGilbert pt/MW

## 2015-10-08 ENCOUNTER — Ambulatory Visit
Admission: RE | Admit: 2015-10-08 | Discharge: 2015-10-08 | Disposition: A | Discharge status: Home / Self Care | Payer: Medicaid Other | Source: Ambulatory Visit | Attending: Gastroenterology | Admitting: Gastroenterology

## 2015-10-08 DIAGNOSIS — R1312 Dysphagia, oropharyngeal phase: Secondary | ICD-10-CM | POA: Diagnosis present

## 2015-10-08 DIAGNOSIS — R1310 Dysphagia, unspecified: Secondary | ICD-10-CM

## 2015-10-08 NOTE — Therapy (Signed)
[facility] DIAGNOSTIC RADIOLOGY [address] [place], [place], [address] Phone: [phone]   Fax:     Modified Barium Swallow  Patient Details  Name: Christy [name] MRN: [id number] Date of Birth: [birth year] No Data Recorded  Encounter Date: 10/08/2015      End of Session - 10/08/15 1350    Visit Number 1   Number of Visits 1   Date for SLP Re-Evaluation 10/08/15   SLP Start Time 1240   SLP Stop Time  1330   SLP Time Calculation (min) 50 min   Activity Tolerance Patient tolerated treatment well      Past Medical History:  Diagnosis Date  . Arthritis   . Asthma   . Diabetes mellitus without complication [facility])    Patient takes Metformin  . Edema   . Genital herpes   . GERD (gastroesophageal reflux disease)   . Hyperlipidemia   . Hypertension   . Malaise and fatigue   . Mental status alteration   . Schizophrenia [facility])     Past Surgical History:  Procedure Laterality Date  . ABDOMINAL HYSTERECTOMY     due to fibroids in 1997  . CHOLECYSTECTOMY    . COLONOSCOPY WITH PROPOFOL N/A 08/15/2015   Procedure: COLONOSCOPY WITH PROPOFOL;  Surgeon: Christena Deem, MD;  Location: [facility] ENDOSCOPY;  Service: Endoscopy;  Laterality: N/A;  . ESOPHAGOGASTRODUODENOSCOPY (EGD) WITH PROPOFOL N/A 08/15/2015   Procedure: ESOPHAGOGASTRODUODENOSCOPY (EGD) WITH PROPOFOL;  Surgeon: Christena Deem, MD;  Location: [facility] ENDOSCOPY;  Service: Endoscopy;  Laterality: N/A;  . FOOT SURGERY Right   . GANGLION CYST EXCISION    . TONSILLECTOMY    . UPPER GI ENDOSCOPY  07/31/13   mild chronic inflammation and reactive gastropathy-no need for another EGD repeat    There were no vitals filed for this visit.   Subjective: Patient behavior: (alertness, ability to follow instructions, etc.): The patient is alert and able to fully participate in the study.  Chief complaint: Patient reports vomiting after meals several times per week.   Objective:  Radiological  Procedure: A videoflouroscopic evaluation of oral-preparatory, reflex initiation, and pharyngeal phases of the swallow was performed; as well as a screening of the upper esophageal phase.  I. POSTURE: Upright in MBS chair  II. VIEW: Lateral  III. COMPENSATORY STRATEGIES: N/A  IV. BOLUSES ADMINISTERED:   Thin Liquid: 2 small sips, 4 rapid, consecutive sips   Nectar-thick Liquid: 1 moderate size sip   Honey-thick Liquid: N/A   Puree: 3 teaspoon presentations   Mechanical Soft: 1/4 graham cracker in applesauce  V. RESULTS OF EVALUATION: A. ORAL PREPARATORY PHASE: (The lips, tongue, and velum are observed for strength and coordination)       **Overall Severity Rating: Within normal limits  B. SWALLOW INITIATION/REFLEX: (The reflex is normal if "triggered" by the time the bolus reached the base of the tongue)  **Overall Severity Rating: Mild; triggers at the valleculae with solids and while falling from the valleculae to the pyriform sinuses with liquids  C. PHARYNGEAL PHASE: (Pharyngeal function is normal if the bolus shows rapid, smooth, and continuous transit through the pharynx and there is no pharyngeal residue after the swallow)  **Overall Severity Rating: Within normal limits  D. LARYNGEAL PENETRATION: (Material entering into the laryngeal inlet/vestibule but not aspirated) Transient laryngeal penetration of liquids without laryngeal vestibule residue  E. ASPIRATION: None  F. 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  Oral control of the bolus including oral hold, rotary mastication, and anterior to posterior transfer are within normal limits. Timing of the pharyngeal swallow is delayed, triggering at the valleculae with solids and while falling from  the valleculae to the pyriform sinuses with liquids.  Aspects of the pharyngeal stage of swallowing including tongue base retraction, hyolaryngeal excursion, epiglottic inversion, duration/amplitude of UES opening, and laryngeal vestibule closure at the height of the swallow are within normal limits.  There is no significant pharyngeal residue.  There is transient laryngeal penetration with liquids and no aspiration.  The reported symptoms were not reproduced during this study, but do not appear to be due to disruption of oropharyngeal swallow function.  The patient was advised to avoid overeating at one time; aim for frequent small meals vs. large meals.      PLAN/RECOMMENDATIONS:   A. Diet: Regular   B. Swallowing Precautions: Reflux precautions- focus on reducing size of meals   C. Recommended consultation to: follow up with GI as recommended   D. Therapy recommendations N/A   E. Results and recommendations were discussed with the patient immediately following the study and the final report routed to the referring practitioner.     Oropharyngeal dysphagia  Dysphagia - Plan: DG SWALLOWING FUNC-SPEECH PATHOLOGY, DG SWALLOWING FUNC-SPEECH PATHOLOGY        Problem List Patient Active Problem List   Diagnosis Date Noted  . Status post abdominal hysterectomy 05/01/2015  . Morbid obesity with body mass index (BMI) of 45.0 to 49.9 in adult [facility](HCC) 05/01/2015  . Acute respiratory failure (HCC) 04/28/2015  . Allergic rhinitis 08/02/2014  . Arthropathia 08/02/2014  . CCF (congestive cardiac failure) (HCC) 08/02/2014  . Current tobacco use 08/02/2014  . Diabetic neuropathy (HCC) 08/02/2014  . Genital herpes 08/02/2014  . Acid reflux 08/02/2014  . BP (high blood pressure) 08/02/2014  . HLD (hyperlipidemia) 08/02/2014  . Incontinence 08/02/2014  . Neuropathy (HCC) 08/02/2014  . Drug noncompliance 08/02/2014  . Adult BMI 30+ 08/02/2014  . Arthritis, degenerative 08/02/2014  .  Delusional disorder (HCC) 08/02/2014  . Acute exacerbation of chronic paranoid schizophrenia (HCC) 08/02/2014  . Avitaminosis D 08/02/2014  . Can't get food down 07/16/2013   Dollene PrimroseSusan G Kemonte Ullman, MS/CCC- SLP  Leandrew KoyanagiAbernathy, Susie 10/08/2015, 1:52 PM  [facility] DIAGNOSTIC RADIOLOGY [address] [place], [place], [address] Phone: [phone]   Fax:     Name: Christy [name] MRN: [id number] Date of Birth: 12/18/1955

## 2015-10-21 ENCOUNTER — Other Ambulatory Visit: Payer: Self-pay

## 2015-10-21 MED ORDER — GLIMEPIRIDE 4 MG PO TABS: 4.0000 mg | ORAL_TABLET | Freq: Every day | ORAL | 12 refills | Status: DC

## 2015-10-21 MED ORDER — RANITIDINE HCL 150 MG PO TABS: 150.0000 mg | ORAL_TABLET | Freq: Two times a day (BID) | ORAL | 12 refills | Status: DC

## 2015-11-05 ENCOUNTER — Encounter: Payer: Self-pay | Admitting: Podiatry

## 2015-11-05 ENCOUNTER — Ambulatory Visit (INDEPENDENT_AMBULATORY_CARE_PROVIDER_SITE_OTHER): Payer: Medicaid Other | Admitting: Podiatry

## 2015-11-05 DIAGNOSIS — M79676 Pain in unspecified toe(s): Secondary | ICD-10-CM

## 2015-11-05 DIAGNOSIS — B351 Tinea unguium: Secondary | ICD-10-CM | POA: Diagnosis not present

## 2015-11-05 NOTE — Progress Notes (Signed)
She presents today with chief complaint of painful elongated toenails 1 through 5 bilateral. Pulses are palpable. Toenails are thick yellow dystrophic lytic mycotic.  Assessment: Pain limb secondary onychomycosis.  Plan: Debridement of toenails 1 through 5 bilateral.

## 2015-11-12 ENCOUNTER — Telehealth: Payer: Self-pay | Admitting: Family Medicine

## 2015-11-12 NOTE — Telephone Encounter (Signed)
Pt contacted office for refill request on the following medications:  terconazole (TERAZOL 3) 0.8 % vaginal cream.  [facility].  CB#[phone]/MW ° °

## 2015-11-12 NOTE — Telephone Encounter (Signed)
Please review-aa 

## 2015-11-13 ENCOUNTER — Other Ambulatory Visit: Payer: Self-pay

## 2015-11-13 MED ORDER — VALACYCLOVIR HCL 1 G PO TABS: 1000.0000 mg | ORAL_TABLET | Freq: Two times a day (BID) | ORAL | 12 refills | Status: DC

## 2015-11-13 NOTE — Telephone Encounter (Signed)
Fax from pharmacy requesting refill on Valtrex-aa

## 2015-11-14 ENCOUNTER — Telehealth: Payer: Self-pay | Admitting: Family Medicine

## 2015-11-14 NOTE — Telephone Encounter (Signed)
Christy [name] with [facility] would like the nurse to return her call to clarify directions for valACYclovir (VALTREX) 1000 MG tablet. Christy [name] stated directions were for 1 tablet twice a day but quantity was only for 30 tablets and she just wanted to make sure that was how it needed to be filled. Please advise. Thanks TNP

## 2015-11-14 NOTE — Telephone Encounter (Signed)
Victorino DikeJennifer advised that quantity should be 60 not 30.-aa

## 2015-11-19 ENCOUNTER — Other Ambulatory Visit: Payer: Self-pay

## 2015-11-19 MED ORDER — SIMVASTATIN 10 MG PO TABS: 10.0000 mg | ORAL_TABLET | Freq: Every day | ORAL | 12 refills | Status: DC

## 2016-01-05 ENCOUNTER — Encounter: Payer: Self-pay | Admitting: Physician Assistant

## 2016-01-05 ENCOUNTER — Ambulatory Visit (INDEPENDENT_AMBULATORY_CARE_PROVIDER_SITE_OTHER): Payer: Medicaid Other | Admitting: Physician Assistant

## 2016-01-05 VITALS — BP 116/68 | HR 84 | Temp 98.5°F | Resp 16 | Wt 312.0 lb

## 2016-01-05 DIAGNOSIS — L659 Nonscarring hair loss, unspecified: Secondary | ICD-10-CM | POA: Diagnosis not present

## 2016-01-05 DIAGNOSIS — K529 Noninfective gastroenteritis and colitis, unspecified: Secondary | ICD-10-CM

## 2016-01-05 MED ORDER — ONDANSETRON HCL 4 MG PO TABS: 4.0000 mg | ORAL_TABLET | Freq: Three times a day (TID) | ORAL | 0 refills | Status: DC | PRN

## 2016-01-05 NOTE — Progress Notes (Signed)
Patient: Christy [name] Female    DOB: [birth year]   60 y.o.   MRN: [id number] Visit Date: 01/05/2016  Today's Provider: Trey Sailors, PA-C   Chief Complaint  Patient presents with  . Emesis    Started yesterday  . Diarrhea   Subjective:    Emesis   This is a new problem. The current episode started yesterday. The problem occurs 2 to 4 times per day. There has been no fever. Associated symptoms include abdominal pain, chills, coughing, diarrhea, dizziness, headaches and myalgias. Pertinent negatives include no fever.  Diarrhea   This is a new problem. The current episode started yesterday. The problem occurs 2 to 4 times per day. Associated symptoms include abdominal pain, chills, coughing, headaches, myalgias and vomiting. Pertinent negatives include no fever.   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 Had one episode of vomiting yesterday night after dinner and several episodes of non-bloody diarrhea. Has eaten and drank today without vomiting. Has been checking sugar. Blood sugar in office was 166. Not feverish. Some right lower abdominal pain that has been ongoing for years. C/o some myalgias.    Allergies  Allergen Reactions  . Cephalexin Itching  . Peroxide [Hydrogen Peroxide] Swelling  . Shellfish Allergy Nausea And Vomiting     Current Outpatient Prescriptions:  .  albuterol (PROVENTIL HFA;VENTOLIN HFA) 108 (90 Base) MCG/ACT inhaler, Inhale 1-2 puffs into the lungs every 6 (six) hours as needed for wheezing or shortness of breath., Disp: 8 Inhaler, Rfl: 6 .  aspirin EC 81 MG tablet, Take 81 mg by mouth daily., Disp: , Rfl:  .  benztropine (COGENTIN) 0.5 MG tablet, Take 0.5 mg by mouth 2 (two) times daily. One tab in the morning and 2 tabs at bedtime, Disp: , Rfl:  .  docusate sodium (COLACE) 100 MG capsule, Take 100 mg by mouth 2 (two) times daily., Disp: , Rfl:  .  FluPHENAZine HCl (PROLIXIN PO), Take 25  mg by mouth. injection, Disp: , Rfl:  .  fluticasone (FLONASE) 50 MCG/ACT nasal spray, Place 2 sprays into both nostrils daily., Disp: 16 g, Rfl: 6 .  furosemide (LASIX) 40 MG tablet, Take 1 tablet (40 mg total) by mouth daily., Disp: 30 tablet, Rfl: 12 .  gabapentin (NEURONTIN) 300 MG capsule, Take 300 mg by mouth 3 (three) times daily., Disp: , Rfl:  .  glimepiride (AMARYL) 4 MG tablet, Take 1 tablet (4 mg total) by mouth daily with breakfast., Disp: 30 tablet, Rfl: 12 .  glucose blood (ACCU-CHEK AVIVA) test strip, Check sugar once daily DX E11.9, Disp: 50 each, Rfl: 12 .  Lancet Devices (ACCU-CHEK SOFTCLIX) lancets, Check sugar once daily, DX E11.9, Disp: 50 each, Rfl: 12 .  loratadine (CLARITIN) 10 MG tablet, Take 1 tablet (10 mg total) by mouth daily., Disp: 30 tablet, Rfl: 12 .  metFORMIN (GLUCOPHAGE) 500 MG tablet, Take 1 tablet (500 mg total) by mouth 2 (two) times daily with a meal., Disp: 180 tablet, Rfl: 3 .  pantoprazole (PROTONIX) 40 MG tablet, Take 40 mg by mouth daily., Disp: , Rfl:  .  potassium chloride SA (K-DUR,KLOR-CON) 20 MEQ tablet, Take 1 tablet (20 mEq total) by mouth 2 (two) times daily., Disp: 60 tablet, Rfl: 12 .  pregabalin (LYRICA) 50 MG capsule, Take 50 mg by mouth 3 (three) times daily., Disp: , Rfl:  .  ranitidine (ZANTAC) 150 MG tablet, Take 1  tablet (150 mg total) by mouth 2 (two) times daily., Disp: 60 tablet, Rfl: 12 .  simvastatin (ZOCOR) 10 MG tablet, Take 1 tablet (10 mg total) by mouth daily., Disp: 30 tablet, Rfl: 12 .  sucralfate (CARAFATE) 1 g tablet, Take 1 tablet (1 g total) by mouth 4 (four) times daily -  with meals and at bedtime., Disp: 120 tablet, Rfl: 12 .  terconazole (TERAZOL 3) 0.8 % vaginal cream, Place 1 applicator vaginally at bedtime., Disp: 20 g, Rfl: 5 .  triamterene-hydrochlorothiazide (MAXZIDE-25) 37.5-25 MG tablet, Take 1 tablet by mouth daily., Disp: 30 tablet, Rfl: 12 .  valACYclovir (VALTREX) 1000 MG tablet, Take 1 tablet (1,000 mg  total) by mouth 2 (two) times daily., Disp: 30 tablet, Rfl: 12 .  ondansetron (ZOFRAN) 4 MG tablet, Take 1 tablet (4 mg total) by mouth every 8 (eight) hours as needed for nausea or vomiting., Disp: 20 tablet, Rfl: 0  Review of Systems  Constitutional: Positive for chills, diaphoresis and fatigue. Negative for activity change, appetite change, fever and unexpected weight change.  HENT: Positive for congestion, nosebleeds, rhinorrhea, sinus pain, sinus pressure, sneezing and trouble swallowing. Negative for ear discharge, ear pain, hearing loss, postnasal drip, sore throat, tinnitus and voice change.   Eyes: Positive for itching. Negative for photophobia, pain, discharge, redness and visual disturbance.  Respiratory: Positive for cough and shortness of breath. Negative for apnea, choking, chest tightness, wheezing and stridor.   Cardiovascular: Negative.   Gastrointestinal: Positive for abdominal pain, diarrhea, nausea and vomiting. Negative for abdominal distention, anal bleeding, blood in stool, constipation and rectal pain.  Musculoskeletal: Positive for myalgias.  Neurological: Positive for dizziness and headaches. Negative for light-headedness.    Social History  Substance Use Topics  . Smoking status: Current Some Day Smoker    Packs/day: 0.25    Types: Cigarettes  . Smokeless tobacco: Never Used  . Alcohol use No   Objective:   BP 116/68 (BP Location: Left Wrist, Patient Position: Sitting, Cuff Size: Normal)   Pulse 84   Temp 98.5 F (36.9 C) (Oral)   Resp 16   Wt (!) 312 lb (141.5 kg)   BMI 46.07 kg/m   Physical Exam  Constitutional: She is oriented to person, place, and time. She appears well-developed and well-nourished. No distress.  Cardiovascular: Normal rate and regular rhythm.   Pulmonary/Chest: Effort normal and breath sounds normal.  Abdominal: Soft. Bowel sounds are normal. She exhibits no distension. There is no tenderness. There is no rebound, no guarding and no  CVA tenderness.  Neurological: She is alert and oriented to person, place, and time.  Skin: Skin is warm and dry. She is not diaphoretic.  Diffuse, female patterned baldness   Psychiatric: She has a normal mood and affect. Her behavior is normal.        Assessment & Plan:      Problem List Items Addressed This Visit    None    Visit Diagnoses    Gastroenteritis    -  Primary   Relevant Medications   ondansetron (ZOFRAN) 4 MG tablet   Alopecia       Relevant Orders   Ambulatory referral to Dermatology     Patient is 60 y/o female presenting with symptoms concerning for gastroenteritis. Afebrile and nontoxic looking in exam room with benign Pe. Sugar high but actually low-normal for this patient. Have given zofran for nausea, and instructed patient to push fluids and get extra rest. Instructed patient to call back  if not feeling better.  Patient also wishes referral for hair loss. Have done this today  Return if symptoms worsen or fail to improve.   There are no Patient Instructions on file for this visit.   Trey Sailors.       Adriana M Pollak, PA-C  [facility]

## 2016-01-15 ENCOUNTER — Ambulatory Visit: Payer: Medicaid Other | Admitting: Family Medicine

## 2016-01-17 ENCOUNTER — Ambulatory Visit: Payer: Medicaid Other | Admitting: Family Medicine

## 2016-01-27 ENCOUNTER — Telehealth: Payer: Self-pay | Admitting: Family Medicine

## 2016-01-27 DIAGNOSIS — N898 Other specified noninflammatory disorders of vagina: Secondary | ICD-10-CM

## 2016-01-27 DIAGNOSIS — B373 Candidiasis of vulva and vagina: Secondary | ICD-10-CM

## 2016-01-27 DIAGNOSIS — B3731 Acute candidiasis of vulva and vagina: Secondary | ICD-10-CM

## 2016-01-27 NOTE — Telephone Encounter (Signed)
Pt needs refill on terconazole cream, vaginal cream/  [facility] Delivered  Pt. Call back is [phone]  Thanks Barth Kirkseri

## 2016-01-27 NOTE — Telephone Encounter (Signed)
Please review-aa 

## 2016-01-28 MED ORDER — TERCONAZOLE 0.8 % VA CREA
1.0000 | TOPICAL_CREAM | Freq: Every day | VAGINAL | 0 refills | Status: DC
Start: 2016-01-28 — End: 2016-06-22

## 2016-01-28 NOTE — Telephone Encounter (Signed)
ok 

## 2016-01-28 NOTE — Telephone Encounter (Signed)
RX sent to pharmacy  

## 2016-02-06 ENCOUNTER — Ambulatory Visit: Payer: Medicaid Other | Admitting: Podiatry

## 2016-02-20 ENCOUNTER — Ambulatory Visit: Payer: Medicaid Other | Admitting: Podiatry

## 2016-04-19 ENCOUNTER — Encounter: Payer: Self-pay | Admitting: Physician Assistant

## 2016-04-19 ENCOUNTER — Ambulatory Visit
Admission: RE | Admit: 2016-04-19 | Discharge: 2016-04-19 | Disposition: A | Discharge status: Home / Self Care | Payer: Medicaid Other | Source: Ambulatory Visit | Attending: Physician Assistant | Admitting: Physician Assistant

## 2016-04-19 ENCOUNTER — Telehealth: Payer: Self-pay

## 2016-04-19 ENCOUNTER — Ambulatory Visit (INDEPENDENT_AMBULATORY_CARE_PROVIDER_SITE_OTHER): Payer: Medicaid Other | Admitting: Physician Assistant

## 2016-04-19 VITALS — BP 132/84 | HR 100 | Temp 99.6°F | Resp 20 | Wt 305.0 lb

## 2016-04-19 DIAGNOSIS — R05 Cough: Secondary | ICD-10-CM | POA: Insufficient documentation

## 2016-04-19 DIAGNOSIS — E1140 Type 2 diabetes mellitus with diabetic neuropathy, unspecified: Secondary | ICD-10-CM

## 2016-04-19 DIAGNOSIS — E119 Type 2 diabetes mellitus without complications: Secondary | ICD-10-CM | POA: Insufficient documentation

## 2016-04-19 DIAGNOSIS — R059 Cough, unspecified: Secondary | ICD-10-CM

## 2016-04-19 DIAGNOSIS — R350 Frequency of micturition: Secondary | ICD-10-CM

## 2016-04-19 LAB — POCT URINALYSIS DIPSTICK
Glucose, UA: NEGATIVE
Ketones, UA: NEGATIVE
Leukocytes, UA: NEGATIVE
Nitrite, UA: NEGATIVE
Protein, UA: NEGATIVE
Spec Grav, UA: 1.005 (ref 1.030–1.035)
Urobilinogen, UA: 0.2 (ref ?–2.0)
pH, UA: 6 (ref 5.0–8.0)

## 2016-04-19 LAB — GLUCOSE, POCT (MANUAL RESULT ENTRY): POC Glucose: 365 mg/dl — AB (ref 70–99)

## 2016-04-19 MED ORDER — DOXYCYCLINE HYCLATE 100 MG PO TABS: 100.0000 mg | ORAL_TABLET | Freq: Two times a day (BID) | ORAL | 0 refills | Status: AC

## 2016-04-19 NOTE — Progress Notes (Signed)
Patient: Christy [name] Female    DOB: [birth year]   61 y.o.   MRN: [id number] Visit Date: 04/19/2016  Today's Provider: Trey Sailors, PA-C   Chief Complaint  Patient presents with  . URI   Subjective:    URI   This is a new problem. The current episode started 1 to 4 weeks ago (x 1 week). The problem has been unchanged. Maximum temperature: pt has not checked temperature, but does have chills and sweats. Temperature in office is 99.6 degrees. Associated symptoms include abdominal pain, chest pain (from coughing; also c/o abdominal pain, back pain, and rib pain), congestion, coughing (yellow sputum), diarrhea, headaches, nausea, rhinorrhea, sinus pain, sneezing, a sore throat, vomiting (yesterday) and wheezing. Pertinent negatives include no dysuria, ear pain, neck pain, plugged ear sensation or swollen glands. Treatments tried: Mucinex. The treatment provided mild relief.   Patient is a 61 y/o woman with history of intellectually disability, uncontrolled Type II diabetes on Metformin 500 mg BID and glimepiride 4 mg QD presenting today with URI, nausea and vomiting ongoing for one week. She reports she has felt feverish and sweaty. She is having coughing spells productive of yellow/green sputum. She is now having pain in her ribs and her back when she coughs. She does not have inspiratory chest pain or chest pain at rest. She has been nauseas and vomiting. She vomits once every few hours. She has not eaten for about a day. The vomiting started yesterday. She reports feeling extremely thirsty and urinating frequently. She is having some crampy abdominal pain and nonbloody diarrhea. She reports checking her sugars, which have been running about 115 when she checks them.  Blood sugar in office is 365 today. Last visit for diabetes was in 06/2015 and pt was having avg readings btwn 260-300's. A1c in office was 10.9 and she was started on Metformin. Has not been seen for diabetes since that  time. Urine dipstick shows trace blood and bilirubin but no ketones or glucose.   She is worried about going to the emergency room because she has court appearance tomorrow in [place].     Allergies  Allergen Reactions  . Cephalexin Itching  . Peroxide [Hydrogen Peroxide] Swelling  . Shellfish Allergy Nausea And Vomiting     Current Outpatient Prescriptions:  .  albuterol (PROVENTIL HFA;VENTOLIN HFA) 108 (90 Base) MCG/ACT inhaler, Inhale 1-2 puffs into the lungs every 6 (six) hours as needed for wheezing or shortness of breath., Disp: 8 Inhaler, Rfl: 6 .  aspirin EC 81 MG tablet, Take 81 mg by mouth daily., Disp: , Rfl:  .  benztropine (COGENTIN) 0.5 MG tablet, Take 0.5 mg by mouth 2 (two) times daily. One tab in the morning and 2 tabs at bedtime, Disp: , Rfl:  .  docusate sodium (COLACE) 100 MG capsule, Take 100 mg by mouth 2 (two) times daily., Disp: , Rfl:  .  FluPHENAZine HCl (PROLIXIN PO), Take 25 mg by mouth. injection, Disp: , Rfl:  .  fluticasone (FLONASE) 50 MCG/ACT nasal spray, Place 2 sprays into both nostrils daily., Disp: 16 g, Rfl: 6 .  furosemide (LASIX) 40 MG tablet, Take 1 tablet (40 mg total) by mouth daily., Disp: 30 tablet, Rfl: 12 .  gabapentin (NEURONTIN) 300 MG capsule, Take 300 mg by mouth 3 (three) times daily., Disp: , Rfl:  .  glimepiride (AMARYL) 4 MG tablet, Take 1 tablet (4 mg total) by mouth daily with breakfast., Disp: 30 tablet, Rfl:  12 .  glucose blood (ACCU-CHEK AVIVA) test strip, Check sugar once daily DX E11.9, Disp: 50 each, Rfl: 12 .  Lancet Devices (ACCU-CHEK SOFTCLIX) lancets, Check sugar once daily, DX E11.9, Disp: 50 each, Rfl: 12 .  loratadine (CLARITIN) 10 MG tablet, Take 1 tablet (10 mg total) by mouth daily., Disp: 30 tablet, Rfl: 12 .  metFORMIN (GLUCOPHAGE) 500 MG tablet, Take 1 tablet (500 mg total) by mouth 2 (two) times daily with a meal., Disp: 180 tablet, Rfl: 3 .  ondansetron (ZOFRAN) 4 MG tablet, Take 1 tablet (4 mg total) by mouth  every 8 (eight) hours as needed for nausea or vomiting., Disp: 20 tablet, Rfl: 0 .  pantoprazole (PROTONIX) 40 MG tablet, Take 40 mg by mouth daily., Disp: , Rfl:  .  potassium chloride SA (K-DUR,KLOR-CON) 20 MEQ tablet, Take 1 tablet (20 mEq total) by mouth 2 (two) times daily., Disp: 60 tablet, Rfl: 12 .  pregabalin (LYRICA) 50 MG capsule, Take 50 mg by mouth 3 (three) times daily., Disp: , Rfl:  .  ranitidine (ZANTAC) 150 MG tablet, Take 1 tablet (150 mg total) by mouth 2 (two) times daily., Disp: 60 tablet, Rfl: 12 .  simvastatin (ZOCOR) 10 MG tablet, Take 1 tablet (10 mg total) by mouth daily., Disp: 30 tablet, Rfl: 12 .  sucralfate (CARAFATE) 1 g tablet, Take 1 tablet (1 g total) by mouth 4 (four) times daily -  with meals and at bedtime., Disp: 120 tablet, Rfl: 12 .  triamterene-hydrochlorothiazide (MAXZIDE-25) 37.5-25 MG tablet, Take 1 tablet by mouth daily., Disp: 30 tablet, Rfl: 12 .  valACYclovir (VALTREX) 1000 MG tablet, Take 1 tablet (1,000 mg total) by mouth 2 (two) times daily., Disp: 30 tablet, Rfl: 12 .  terconazole (TERAZOL 3) 0.8 % vaginal cream, Place 1 applicator vaginally at bedtime., Disp: 20 g, Rfl: 0  Review of Systems  HENT: Positive for congestion, rhinorrhea, sinus pain, sneezing and sore throat. Negative for ear pain.   Respiratory: Positive for cough (yellow sputum) and wheezing.   Cardiovascular: Positive for chest pain (from coughing; also c/o abdominal pain, back pain, and rib pain).  Gastrointestinal: Positive for abdominal pain, diarrhea, nausea and vomiting (yesterday).  Genitourinary: Negative for dysuria.  Musculoskeletal: Negative for neck pain.  Neurological: Positive for headaches.    Social History  Substance Use Topics  . Smoking status: Current Some Day Smoker    Packs/day: 0.00    Types: Cigarettes  . Smokeless tobacco: Never Used     Comment: only smokes sometimes  . Alcohol use Yes     Comment: beer occasionally   Objective:   BP 132/84  (BP Location: Left Arm, Patient Position: Sitting, Cuff Size: Large)   Pulse 100   Temp 99.6 F (37.6 C) (Oral)   Resp 20   Wt (!) 305 lb (138.3 kg)   SpO2 98%   BMI 45.04 kg/m  Vitals:   04/19/16 1306  BP: 132/84  Pulse: 100  Resp: 20  Temp: 99.6 F (37.6 C)  TempSrc: Oral  SpO2: 98%  Weight: (!) 305 lb (138.3 kg)     Physical Exam  Constitutional: She is oriented to person, place, and time. She appears well-developed and well-nourished. She does not appear ill.  Appears ill in office, shirt soaked through with sweat.  HENT:  Right Ear: External ear normal.  Left Ear: External ear normal.  Mouth/Throat: Oropharynx is clear and moist. No oropharyngeal exudate.  Eyes: Conjunctivae are normal.  Neck: Neck supple.  Cardiovascular: Regular rhythm and normal heart sounds.   Pulmonary/Chest: Effort normal and breath sounds normal. No respiratory distress. She has no wheezes. She has no rales.  Not SOB, no kussmaul breathing.  Abdominal: Soft. Bowel sounds are normal. She exhibits no distension. There is no tenderness. There is no rebound.  Lymphadenopathy:    She has no cervical adenopathy.  Neurological: She is alert and oriented to person, place, and time.  Skin: Skin is warm.  Warm and clammy to touch. No tenting. Good turgor.  Psychiatric: She has a normal mood and affect. Her behavior is normal.        Assessment & Plan:     1. Urinary frequency  I have seen this patient once before, but difficult to know her baseline. She has intellectual disability and the history may be unreliable. She is afebrile and nontoxic in office, though slightly warm and sweaty to touch. According to staff who knows her, she has episodes of sweating at baseline. She is alert and oriented today. Hyperglycemic in office. She has not been seen in clinic for diabetes since June 2017. Urine dipstick showed no ketones or blood, was not concentrated. She doesn't show any signs of severe dehydration  such as tenting, dry mucous membranes, sunken globes. No kussmaul breathing. She declines going to the emergency room because she needs to go to a court appointment tomorrow in [place]. She is alone today and has provided a number for her transportation, which I have called. They are her mental health providers who "look out for their patients" according to my conversation. 2/2 her limited transportation, urgent evaluation at medical mall is not an option. Her transportation has agreed to pick her up after lab draws to take her to the imaging center across the street for CXR. Given circumstances, I think this is the best course of action for her, with close follow up with me in clinic. Will send urine off for culture. Will cover for respiratory pathogens with doxycycline. Have instructed to push fluids. Have instructed that she must go to the ER if she begins to feel worse.  - POCT urinalysis dipstick - Urine culture - Comprehensive metabolic panel - CBC With Differential  2. Type 2 diabetes mellitus with diabetic neuropathy, without long-term current use of insulin (HCC)  - POCT glucose (manual entry) - POCT urinalysis dipstick - Urine culture - Comprehensive metabolic panel - CBC With Differential  3. Cough  - DG Chest 2 View; Future - doxycycline (VIBRA-TABS) 100 MG tablet; Take 1 tablet (100 mg total) by mouth 2 (two) times daily.  Dispense: 14 tablet; Refill: 0  Return in about 2 days (around 04/21/2016) for follow up, nausea, vomiting - Dr. Sullivan Lone.  I have spent 25 minutes with this patient, >50% of which was spent on counseling and coordination of care.  The entirety of the information documented in the History of Present Illness, Review of Systems and Physical Exam were personally obtained by me. Portions of this information were initially documented by Selinda Eon, CMA and reviewed by me for thoroughness and accuracy.      Patient seen and examined by Trey Sailors, PA-C, and  note scribed by Allene Dillon, CMA.  Trey Sailors, PA-C  [facility]

## 2016-04-19 NOTE — Telephone Encounter (Signed)
-----   Message from Trey SailorsAdriana M Pollak, [place]PA-C sent at 04/19/2016  3:55 PM EDT ----- CXR normal. Would still like patient to take antibiotics and follow up with us on Wednesday. If she feels worse then please go to ER, thanks.

## 2016-04-19 NOTE — Telephone Encounter (Signed)
Pt had VM that was not set up. Will need to try again later. Christy DillonEmily [name], CMA

## 2016-04-19 NOTE — Patient Instructions (Signed)
Community-Acquired Pneumonia, Adult °Pneumonia is an infection of the lungs. One type of pneumonia can happen while a person is in a hospital. A different type can happen when a person is not in a hospital (community-acquired pneumonia). It is easy for this kind to spread from person to person. It can spread to you if you breathe near an infected person who coughs or sneezes. Some symptoms include: °· A dry cough. °· A wet (productive) cough. °· Fever. °· Sweating. °· Chest pain. °Follow these instructions at home: °· Take over-the-counter and prescription medicines only as told by your doctor. °¨ Only take cough medicine if you are losing sleep. °¨ If you were prescribed an antibiotic medicine, take it as told by your doctor. Do not stop taking the antibiotic even if you start to feel better. °· Sleep with your head and neck raised (elevated). You can do this by putting a few pillows under your head, or you can sleep in a recliner. °· Do not use tobacco products. These include cigarettes, chewing tobacco, and e-cigarettes. If you need help quitting, ask your doctor. °· Drink enough water to keep your pee (urine) clear or pale yellow. °A shot (vaccine) can help prevent pneumonia. Shots are often suggested for: °· People older than 63 years of age. °· People older than 63 years of age: °¨ Who are having cancer treatment. °¨ Who have long-term (chronic) lung disease. °¨ Who have problems with their body's defense system (immune system). °You may also prevent pneumonia if you take these actions: °· Get the flu (influenza) shot every year. °· Go to the dentist as often as told. °· Wash your hands often. If soap and water are not available, use hand sanitizer. °Contact a doctor if: °· You have a fever. °· You lose sleep because your cough medicine does not help. °Get help right away if: °· You are short of breath and it gets worse. °· You have more chest pain. °· Your sickness gets worse. This is very serious if: °¨ You  are an older adult. °¨ Your body's defense system is weak. °· You cough up blood. °This information is not intended to replace advice given to you by your health care provider. Make sure you discuss any questions you have with your health care provider. °Document Released: 06/30/2007 Document Revised: 06/19/2015 Document Reviewed: 05/08/2014 °Elsevier Interactive Patient Education © 2017 [employer] ° °

## 2016-04-20 ENCOUNTER — Other Ambulatory Visit: Payer: Self-pay

## 2016-04-20 DIAGNOSIS — R110 Nausea: Secondary | ICD-10-CM

## 2016-04-20 DIAGNOSIS — R109 Unspecified abdominal pain: Secondary | ICD-10-CM

## 2016-04-20 LAB — COMPREHENSIVE METABOLIC PANEL
ALT: 56 IU/L — ABNORMAL HIGH (ref 0–32)
AST: 60 IU/L — ABNORMAL HIGH (ref 0–40)
Albumin/Globulin Ratio: 1.8 (ref 1.2–2.2)
Albumin: 4.6 g/dL (ref 3.6–4.8)
Alkaline Phosphatase: 90 IU/L (ref 39–117)
BUN/Creatinine Ratio: 9 — ABNORMAL LOW (ref 12–28)
BUN: 10 mg/dL (ref 8–27)
Bilirubin Total: 0.5 mg/dL (ref 0.0–1.2)
CO2: 29 mmol/L (ref 18–29)
Calcium: 9.5 mg/dL (ref 8.7–10.3)
Chloride: 81 mmol/L — ABNORMAL LOW (ref 96–106)
Creatinine, Ser: 1.13 mg/dL — ABNORMAL HIGH (ref 0.57–1.00)
GFR calc Af Amer: 60 mL/min/{1.73_m2} (ref >59)
GFR calc non Af Amer: 52 mL/min/{1.73_m2} — ABNORMAL LOW (ref >59)
Globulin, Total: 2.6 g/dL (ref 1.5–4.5)
Glucose: 337 mg/dL — ABNORMAL HIGH (ref 65–99)
Potassium: 3.9 mmol/L (ref 3.5–5.2)
Sodium: 132 mmol/L — ABNORMAL LOW (ref 134–144)
Total Protein: 7.2 g/dL (ref 6.0–8.5)

## 2016-04-20 LAB — CBC WITH DIFFERENTIAL
Basophils Absolute: 0 10*3/uL (ref 0.0–0.2)
Basos: 0 %
EOS (ABSOLUTE): 0 10*3/uL (ref 0.0–0.4)
Eos: 0 %
Hematocrit: 40.4 % (ref 34.0–46.6)
Hemoglobin: 12.9 g/dL (ref 11.1–15.9)
Immature Grans (Abs): 0 10*3/uL (ref 0.0–0.1)
Immature Granulocytes: 0 %
Lymphocytes Absolute: 0.9 10*3/uL (ref 0.7–3.1)
Lymphs: 17 %
MCH: 21.9 pg — ABNORMAL LOW (ref 26.6–33.0)
MCHC: 31.9 g/dL (ref 31.5–35.7)
MCV: 69 fL — ABNORMAL LOW (ref 79–97)
Monocytes Absolute: 1.6 10*3/uL — ABNORMAL HIGH (ref 0.1–0.9)
Monocytes: 31 %
Neutrophils Absolute: 2.6 10*3/uL (ref 1.4–7.0)
Neutrophils: 52 %
RBC: 5.88 x10E6/uL — ABNORMAL HIGH (ref 3.77–5.28)
RDW: 16.9 % — ABNORMAL HIGH (ref 12.3–15.4)
WBC: 5.1 10*3/uL (ref 3.4–10.8)

## 2016-04-20 MED ORDER — SUCRALFATE 1 G PO TABS: 1.0000 g | ORAL_TABLET | Freq: Three times a day (TID) | ORAL | 12 refills | Status: DC

## 2016-04-21 ENCOUNTER — Encounter: Payer: Self-pay | Admitting: Physician Assistant

## 2016-04-21 ENCOUNTER — Ambulatory Visit: Payer: Self-pay | Admitting: Physician Assistant

## 2016-04-21 ENCOUNTER — Ambulatory Visit (INDEPENDENT_AMBULATORY_CARE_PROVIDER_SITE_OTHER): Payer: Medicaid Other | Admitting: Physician Assistant

## 2016-04-21 VITALS — BP 108/60 | HR 82 | Temp 99.0°F | Wt 306.0 lb

## 2016-04-21 DIAGNOSIS — R112 Nausea with vomiting, unspecified: Secondary | ICD-10-CM

## 2016-04-21 DIAGNOSIS — R7401 Elevation of levels of liver transaminase levels: Secondary | ICD-10-CM

## 2016-04-21 DIAGNOSIS — R740 Nonspecific elevation of levels of transaminase and lactic acid dehydrogenase [LDH]: Secondary | ICD-10-CM

## 2016-04-21 DIAGNOSIS — R10826 Epigastric rebound abdominal tenderness: Secondary | ICD-10-CM

## 2016-04-21 LAB — URINE CULTURE

## 2016-04-21 MED ORDER — ONDANSETRON HCL 4 MG PO TABS: 4.0000 mg | ORAL_TABLET | Freq: Three times a day (TID) | ORAL | 0 refills | Status: DC | PRN

## 2016-04-21 NOTE — Telephone Encounter (Signed)
Pt in the office today and will be advised of these results-aa

## 2016-04-21 NOTE — Progress Notes (Signed)
Patient: Christy [name] Female    DOB: 1955/02/14   61 y.o.   MRN: [id number] Visit Date: 04/21/2016  Today's Provider: Trey Sailors, PA-C   Chief Complaint  Patient presents with  . Nausea  . Cough   Subjective:    Cough  The current episode started in the past 7 days. The problem has been gradually improving (Pt says she "Feels a little bit better, but not back to her normal self".  ). The cough is productive of sputum. Associated symptoms include chills (Pt felt "cold last night"), headaches, nasal congestion, postnasal drip, rhinorrhea, shortness of breath, sweats and wheezing. Pertinent negatives include no chest pain, ear congestion, ear pain, eye redness, fever, heartburn, hemoptysis or sore throat.   Patient is a 61 y/o woman with cholecystectomy and hysterectomy, schizophrenia, and Type II DM here for follow up of respiratory symptoms and nauasea/vomitingdiarrhea. Labs drawn two days ago revealed no white count but some hyponatremia, hypochloremia, and elevated transaminases. She had a CXR which was normal and wast started on doxycycline 100 mg BID, which she reports she is taking. She is presenting today feeling a little better but not completely better. She remains nauseas. She last vomited yesterday. She is drinking adequately and reports continued diarrhea. She is having some RUQ and epigastric pain. No flank pain or urinary symptoms.    Allergies  Allergen Reactions  . Cephalexin Itching  . Peroxide [Hydrogen Peroxide] Swelling  . Shellfish Allergy Nausea And Vomiting     Current Outpatient Prescriptions:  .  albuterol (PROVENTIL HFA;VENTOLIN HFA) 108 (90 Base) MCG/ACT inhaler, Inhale 1-2 puffs into the lungs every 6 (six) hours as needed for wheezing or shortness of breath., Disp: 8 Inhaler, Rfl: 6 .  aspirin EC 81 MG tablet, Take 81 mg by mouth daily., Disp: , Rfl:  .  benztropine (COGENTIN) 0.5 MG tablet, Take 0.5 mg by mouth 2 (two) times daily. One tab in  the morning and 2 tabs at bedtime, Disp: , Rfl:  .  docusate sodium (COLACE) 100 MG capsule, Take 100 mg by mouth 2 (two) times daily., Disp: , Rfl:  .  doxycycline (VIBRA-TABS) 100 MG tablet, Take 1 tablet (100 mg total) by mouth 2 (two) times daily., Disp: 14 tablet, Rfl: 0 .  FluPHENAZine HCl (PROLIXIN PO), Take 25 mg by mouth. injection, Disp: , Rfl:  .  fluticasone (FLONASE) 50 MCG/ACT nasal spray, Place 2 sprays into both nostrils daily., Disp: 16 g, Rfl: 6 .  furosemide (LASIX) 40 MG tablet, Take 1 tablet (40 mg total) by mouth daily., Disp: 30 tablet, Rfl: 12 .  gabapentin (NEURONTIN) 300 MG capsule, Take 300 mg by mouth 3 (three) times daily., Disp: , Rfl:  .  glimepiride (AMARYL) 4 MG tablet, Take 1 tablet (4 mg total) by mouth daily with breakfast., Disp: 30 tablet, Rfl: 12 .  glucose blood (ACCU-CHEK AVIVA) test strip, Check sugar once daily DX E11.9, Disp: 50 each, Rfl: 12 .  Lancet Devices (ACCU-CHEK SOFTCLIX) lancets, Check sugar once daily, DX E11.9, Disp: 50 each, Rfl: 12 .  loratadine (CLARITIN) 10 MG tablet, Take 1 tablet (10 mg total) by mouth daily., Disp: 30 tablet, Rfl: 12 .  metFORMIN (GLUCOPHAGE) 500 MG tablet, Take 1 tablet (500 mg total) by mouth 2 (two) times daily with a meal., Disp: 180 tablet, Rfl: 3 .  ondansetron (ZOFRAN) 4 MG tablet, Take 1 tablet (4 mg total) by mouth every 8 (eight) hours as  needed for nausea or vomiting., Disp: 20 tablet, Rfl: 0 .  pantoprazole (PROTONIX) 40 MG tablet, Take 40 mg by mouth daily., Disp: , Rfl:  .  potassium chloride SA (K-DUR,KLOR-CON) 20 MEQ tablet, Take 1 tablet (20 mEq total) by mouth 2 (two) times daily., Disp: 60 tablet, Rfl: 12 .  pregabalin (LYRICA) 50 MG capsule, Take 50 mg by mouth 3 (three) times daily., Disp: , Rfl:  .  ranitidine (ZANTAC) 150 MG tablet, Take 1 tablet (150 mg total) by mouth 2 (two) times daily., Disp: 60 tablet, Rfl: 12 .  simvastatin (ZOCOR) 10 MG tablet, Take 1 tablet (10 mg total) by mouth daily.,  Disp: 30 tablet, Rfl: 12 .  sucralfate (CARAFATE) 1 g tablet, Take 1 tablet (1 g total) by mouth 4 (four) times daily -  with meals and at bedtime., Disp: 120 tablet, Rfl: 12 .  terconazole (TERAZOL 3) 0.8 % vaginal cream, Place 1 applicator vaginally at bedtime., Disp: 20 g, Rfl: 0 .  triamterene-hydrochlorothiazide (MAXZIDE-25) 37.5-25 MG tablet, Take 1 tablet by mouth daily., Disp: 30 tablet, Rfl: 12 .  valACYclovir (VALTREX) 1000 MG tablet, Take 1 tablet (1,000 mg total) by mouth 2 (two) times daily., Disp: 30 tablet, Rfl: 12  Review of Systems  Constitutional: Positive for appetite change (Pt does not have an appetite), chills (Pt felt "cold last night") and diaphoresis (Pt reports being "sweaty" this morning. ). Negative for activity change, fatigue, fever and unexpected weight change.  HENT: Positive for congestion, nosebleeds, postnasal drip, rhinorrhea, sinus pain, sinus pressure and sneezing. Negative for ear discharge, ear pain, hearing loss, sore throat, tinnitus and trouble swallowing.   Eyes: Positive for itching. Negative for photophobia, pain, discharge, redness and visual disturbance.  Respiratory: Positive for cough, chest tightness, shortness of breath and wheezing. Negative for apnea, hemoptysis, choking and stridor.   Cardiovascular: Negative for chest pain.  Gastrointestinal: Positive for abdominal pain, diarrhea (Had some diarrhea this morning. ), nausea and vomiting (Last time she vomited was yesterday). Negative for abdominal distention, anal bleeding, blood in stool, constipation, heartburn and rectal pain.  Neurological: Positive for weakness and headaches. Negative for dizziness and light-headedness.    Social History  Substance Use Topics  . Smoking status: Current Some Day Smoker    Packs/day: 0.00    Types: Cigarettes  . Smokeless tobacco: Never Used     Comment: only smokes sometimes  . Alcohol use Yes     Comment: beer occasionally   Objective:   BP 108/60  (BP Location: Left Arm, Patient Position: Sitting, Cuff Size: Large)   Pulse 82   Temp 99 F (37.2 C) (Oral)   Wt (!) 306 lb (138.8 kg)   SpO2 97%   BMI 45.19 kg/m  Vitals:   04/21/16 1348  BP: 108/60  Pulse: 82  Temp: 99 F (37.2 C)  TempSrc: Oral  SpO2: 97%  Weight: (!) 306 lb (138.8 kg)     Physical Exam  Constitutional: She is oriented to person, place, and time. She appears well-developed and well-nourished. She does not appear ill.  Cardiovascular: Normal rate and regular rhythm.   Pulmonary/Chest: Effort normal and breath sounds normal.  Abdominal: Soft. Bowel sounds are normal. She exhibits no distension. There is tenderness in the right upper quadrant and epigastric area. There is no rebound and no guarding.  Neurological: She is alert and oriented to person, place, and time.  Skin: Skin is warm and dry.  Psychiatric: She has a normal mood and  affect. Her behavior is normal.        Assessment & Plan:     1. Nausea and vomiting, intractability of vomiting not specified, unspecified vomiting type  She is having some mild RUQ and epigastric tenderness. Will recheck labs to see if any improvement. No white count last time, but electrolyte derangement and elevated transaminases. Patient is s/p cholecystectomy with multiple GI and OBGYN workups, all of which negative. Check lipase and amylase for pancreatitis. Will hold off on U/S until these labs return. She'll see Dr. Sullivan Lone in clinic next week.   - Comprehensive metabolic panel - CBC with Differential/Platelet - ondansetron (ZOFRAN) 4 MG tablet; Take 1 tablet (4 mg total) by mouth every 8 (eight) hours as needed for nausea or vomiting.  Dispense: 20 tablet; Refill: 0  2. Elevated transaminase measurement  - Lipase - Amylase  3. Epigastric abdominal tenderness with rebound tenderness  Return in about 4 days (around 04/25/2016) for DR Sullivan Lone, N/V abdominal pain.  The entirety of the information documented in the  History of Present Illness, Review of Systems and Physical Exam were personally obtained by me. Portions of this information were initially documented by Kavin Leech, CMA and reviewed by me for thoroughness and accuracy.        Trey Sailors, PA-C  [facility]

## 2016-04-21 NOTE — Patient Instructions (Signed)
Nausea and Vomiting, Adult Feeling sick to your stomach (nausea) means that your stomach is upset or you feel like you have to throw up (vomit). Feeling more and more sick to your stomach can lead to throwing up. Throwing up happens when food and liquid from your stomach are thrown up and out the mouth. Throwing up can make you feel weak and cause you to get dehydrated. Dehydration can make you tired and thirsty, make you have a dry mouth, and make it so you pee (urinate) less often. Older adults and people with other diseases or a weak defense system (immune system) are at higher risk for dehydration. If you feel sick to your stomach or if you throw up, it is important to follow instructions from your doctor about how to take care of yourself. Follow these instructions at home: Eating and drinking  Follow these instructions as told by your doctor:  Take an oral rehydration solution (ORS). This is a drink that is sold at pharmacies and stores.  Drink clear fluids in small amounts as you are able, such as:  Water.  Ice chips.  Diluted fruit juice.  Low-calorie sports drinks.  Eat bland, easy-to-digest foods in small amounts as you are able, such as:  Bananas.  Applesauce.  Rice.  Low-fat (lean) meats.  Toast.  Crackers.  Avoid fluids that have a lot of sugar or caffeine in them.  Avoid alcohol.  Avoid spicy or fatty foods. General instructions   Drink enough fluid to keep your pee (urine) clear or pale yellow.  Wash your hands often. If you cannot use soap and water, use hand sanitizer.  Make sure that all people in your home wash their hands well and often.  Take over-the-counter and prescription medicines only as told by your doctor.  Rest at home while you get better.  Watch your condition for any changes.  Breathe slowly and deeply when you feel sick to your stomach.  Keep all follow-up visits as told by your doctor. This is important. Contact a doctor  if:  You have a fever.  You cannot keep fluids down.  Your symptoms get worse.  You have new symptoms.  You feel sick to your stomach for more than two days.  You feel light-headed or dizzy.  You have a headache.  You have muscle cramps. Get help right away if:  You have pain in your chest, neck, arm, or jaw.  You feel very weak or you pass out (faint).  You throw up again and again.  You see blood in your throw-up.  Your throw-up looks like black coffee grounds.  You have bloody or black poop (stools) or poop that look like tar.  You have a very bad headache, a stiff neck, or both.  You have a rash.  You have very bad pain, cramping, or bloating in your belly (abdomen).  You have trouble breathing.  You are breathing very quickly.  Your heart is beating very quickly.  Your skin feels cold and clammy.  You feel confused.  You have pain when you pee.  You have signs of dehydration, such as:  Dark pee, hardly any pee, or no pee.  Cracked lips.  Dry mouth.  Sunken eyes.  Sleepiness.  Weakness. These symptoms may be an emergency. Do not wait to see if the symptoms will go away. Get medical help right away. Call your local emergency services (911 in the [place] Do not drive yourself to the hospital. This information   is not intended to replace advice given to you by your health care provider. Make sure you discuss any questions you have with your health care provider. Document Released: 06/30/2007 Document Revised: 08/01/2015 Document Reviewed: 09/17/2014 Elsevier Interactive Patient Education  2017 Elsevier Inc.  

## 2016-04-22 LAB — CBC WITH DIFFERENTIAL/PLATELET
Basophils Absolute: 0 10*3/uL (ref 0.0–0.2)
Basos: 0 %
EOS (ABSOLUTE): 0 10*3/uL (ref 0.0–0.4)
Eos: 0 %
Hematocrit: 36.8 % (ref 34.0–46.6)
Hemoglobin: 11.8 g/dL (ref 11.1–15.9)
Immature Grans (Abs): 0 10*3/uL (ref 0.0–0.1)
Immature Granulocytes: 0 %
Lymphocytes Absolute: 1.1 10*3/uL (ref 0.7–3.1)
Lymphs: 17 %
MCH: 21.6 pg — ABNORMAL LOW (ref 26.6–33.0)
MCHC: 32.1 g/dL (ref 31.5–35.7)
MCV: 67 fL — ABNORMAL LOW (ref 79–97)
Monocytes Absolute: 1.2 10*3/uL — ABNORMAL HIGH (ref 0.1–0.9)
Monocytes: 19 %
Neutrophils Absolute: 3.9 10*3/uL (ref 1.4–7.0)
Neutrophils: 64 %
Platelets: 218 10*3/uL (ref 150–379)
RBC: 5.46 x10E6/uL — ABNORMAL HIGH (ref 3.77–5.28)
RDW: 16.7 % — ABNORMAL HIGH (ref 12.3–15.4)
WBC: 6.2 10*3/uL (ref 3.4–10.8)

## 2016-04-22 LAB — COMPREHENSIVE METABOLIC PANEL
ALT: 45 IU/L — ABNORMAL HIGH (ref 0–32)
AST: 41 IU/L — ABNORMAL HIGH (ref 0–40)
Albumin/Globulin Ratio: 1.6 (ref 1.2–2.2)
Albumin: 4.2 g/dL (ref 3.6–4.8)
Alkaline Phosphatase: 80 IU/L (ref 39–117)
BUN/Creatinine Ratio: 12 (ref 12–28)
BUN: 11 mg/dL (ref 8–27)
Bilirubin Total: 0.4 mg/dL (ref 0.0–1.2)
CO2: 29 mmol/L (ref 18–29)
Calcium: 9.1 mg/dL (ref 8.7–10.3)
Chloride: 82 mmol/L — ABNORMAL LOW (ref 96–106)
Creatinine, Ser: 0.89 mg/dL (ref 0.57–1.00)
GFR calc Af Amer: 80 mL/min/{1.73_m2} (ref >59)
GFR calc non Af Amer: 70 mL/min/{1.73_m2} (ref >59)
Globulin, Total: 2.6 g/dL (ref 1.5–4.5)
Glucose: 240 mg/dL — ABNORMAL HIGH (ref 65–99)
Potassium: 3.5 mmol/L (ref 3.5–5.2)
Sodium: 130 mmol/L — ABNORMAL LOW (ref 134–144)
Total Protein: 6.8 g/dL (ref 6.0–8.5)

## 2016-04-22 LAB — LIPASE: Lipase: 24 U/L (ref 14–72)

## 2016-04-22 LAB — AMYLASE: Amylase: 41 U/L (ref 31–124)

## 2016-04-26 ENCOUNTER — Ambulatory Visit: Payer: Medicaid Other | Admitting: Family Medicine

## 2016-04-30 ENCOUNTER — Emergency Department: Payer: Medicaid Other

## 2016-04-30 ENCOUNTER — Emergency Department
Admission: EM | Admit: 2016-04-30 | Discharge: 2016-04-30 | Disposition: A | Discharge status: Home / Self Care | Payer: Medicaid Other | Attending: Emergency Medicine | Admitting: Emergency Medicine

## 2016-04-30 DIAGNOSIS — E1165 Type 2 diabetes mellitus with hyperglycemia: Secondary | ICD-10-CM | POA: Diagnosis present

## 2016-04-30 DIAGNOSIS — E860 Dehydration: Secondary | ICD-10-CM | POA: Diagnosis not present

## 2016-04-30 DIAGNOSIS — I10 Essential (primary) hypertension: Secondary | ICD-10-CM | POA: Insufficient documentation

## 2016-04-30 DIAGNOSIS — Z79899 Other long term (current) drug therapy: Secondary | ICD-10-CM | POA: Insufficient documentation

## 2016-04-30 DIAGNOSIS — J45909 Unspecified asthma, uncomplicated: Secondary | ICD-10-CM | POA: Insufficient documentation

## 2016-04-30 DIAGNOSIS — Z7984 Long term (current) use of oral hypoglycemic drugs: Secondary | ICD-10-CM | POA: Diagnosis not present

## 2016-04-30 DIAGNOSIS — F17210 Nicotine dependence, cigarettes, uncomplicated: Secondary | ICD-10-CM | POA: Diagnosis not present

## 2016-04-30 DIAGNOSIS — Z7982 Long term (current) use of aspirin: Secondary | ICD-10-CM | POA: Insufficient documentation

## 2016-04-30 DIAGNOSIS — R739 Hyperglycemia, unspecified: Secondary | ICD-10-CM

## 2016-04-30 LAB — COMPREHENSIVE METABOLIC PANEL
ALK PHOS: 71 U/L (ref 38–126)
ALT: 28 U/L (ref 14–54)
AST: 37 U/L (ref 15–41)
Albumin: 4 g/dL (ref 3.5–5.0)
Anion gap: 13 (ref 5–15)
BILIRUBIN TOTAL: 1.3 mg/dL — AB (ref 0.3–1.2)
BUN: 10 mg/dL (ref 6–20)
CALCIUM: 9 mg/dL (ref 8.9–10.3)
CO2: 34 mmol/L — AB (ref 22–32)
CREATININE: 1.02 mg/dL — AB (ref 0.44–1.00)
Chloride: 83 mmol/L — ABNORMAL LOW (ref 101–111)
GFR calc Af Amer: >60 mL/min (ref >60)
GFR calc non Af Amer: 58 mL/min — ABNORMAL LOW (ref >60)
Glucose, Bld: 340 mg/dL — ABNORMAL HIGH (ref 65–99)
Potassium: 2.9 mmol/L — ABNORMAL LOW (ref 3.5–5.1)
Sodium: 130 mmol/L — ABNORMAL LOW (ref 135–145)
Total Protein: 7.5 g/dL (ref 6.5–8.1)

## 2016-04-30 LAB — CBC WITH DIFFERENTIAL/PLATELET
BASOS PCT: 1 %
Basophils Absolute: 0.1 10*3/uL (ref 0–0.1)
EOS ABS: 0.3 10*3/uL (ref 0–0.7)
Eosinophils Relative: 4 %
HCT: 40.5 % (ref 35.0–47.0)
Hemoglobin: 12.9 g/dL (ref 12.0–16.0)
Lymphocytes Relative: 19 %
Lymphs Abs: 1.4 10*3/uL (ref 1.0–3.6)
MCH: 21.3 pg — ABNORMAL LOW (ref 26.0–34.0)
MCHC: 31.8 g/dL — AB (ref 32.0–36.0)
MCV: 67.1 fL — ABNORMAL LOW (ref 80.0–100.0)
MONO ABS: 1.5 10*3/uL — AB (ref 0.2–0.9)
Monocytes Relative: 19 %
NEUTROS PCT: 57 %
Neutro Abs: 4.3 10*3/uL (ref 1.4–6.5)
PLATELETS: 291 10*3/uL (ref 150–440)
RBC: 6.05 MIL/uL — ABNORMAL HIGH (ref 3.80–5.20)
RDW: 16.2 % — AB (ref 11.5–14.5)
WBC: 7.5 10*3/uL (ref 3.6–11.0)

## 2016-04-30 LAB — URINALYSIS, COMPLETE (UACMP) WITH MICROSCOPIC
BILIRUBIN URINE: NEGATIVE
Glucose, UA: 150 mg/dL — AB
HGB URINE DIPSTICK: NEGATIVE
Ketones, ur: NEGATIVE mg/dL
Leukocytes, UA: NEGATIVE
NITRITE: NEGATIVE
PH: 6 (ref 5.0–8.0)
Protein, ur: NEGATIVE mg/dL
SPECIFIC GRAVITY, URINE: 1.005 (ref 1.005–1.030)

## 2016-04-30 LAB — BETA-HYDROXYBUTYRIC ACID: Beta-Hydroxybutyric Acid: 0.18 mmol/L (ref 0.05–0.27)

## 2016-04-30 LAB — GLUCOSE, CAPILLARY: GLUCOSE-CAPILLARY: 300 mg/dL — AB (ref 65–99)

## 2016-04-30 MED ORDER — DICLOFENAC SODIUM 1 % TD GEL: 4.0000 g | Freq: Four times a day (QID) | TRANSDERMAL | 0 refills | Status: DC

## 2016-04-30 MED ORDER — SODIUM CHLORIDE 0.9 % IV BOLUS (SEPSIS)
1000.0000 mL | Freq: Once | INTRAVENOUS | Status: AC
  Administered 2016-04-30: 1000 mL via INTRAVENOUS

## 2016-04-30 NOTE — ED Provider Notes (Signed)
-----------------------------------------   4:29 PM on 04/30/2016 -----------------------------------------  Called into patient's room, as she was being discharged she asked about some medication for arthritis. She states she has arthritis of her left leg. Discussed with patient that we can try and NSAID cream. She will follow up with primary care doctor.   Phineas Semen, MD 04/30/16 [phone]

## 2016-04-30 NOTE — ED Notes (Addendum)
Attempted to call Sheena at [phone], patients cousin, who patient states is her ride home. On second this attempt left a voicemail for [employer]

## 2016-04-30 NOTE — ED Provider Notes (Signed)
[facility] Emergency Department Provider Note  ____________________________________________   None    (approximate)  I have reviewed the triage vital signs and the nursing notes.   HISTORY  Chief Complaint Hyperglycemia    HPI Christy [name] is a 61 y.o. female who comes to the emergency department via EMS for elevated blood sugar. The patient has a past medical history of paranoid schizophrenia and developmental delay and she has a home health nurse who comes every day to help her take her medications. Yesterday the nurse did not show up for unclear reasons and when she came to the patient's home today she noted the patient had an elevated respiratory rate and a blood sugar over 400 she called 911. She says that she has been sick for the past 2 weeks or so with a dry cough.She also reports 2 years of intermittent aching discomfort in her left knee and she is requesting an x-ray.   Past Medical History:  Diagnosis Date  . Arthritis   . Asthma   . Diabetes mellitus without complication [facility])    Patient takes Metformin  . Edema   . Genital herpes   . GERD (gastroesophageal reflux disease)   . Hyperlipidemia   . Hypertension   . Malaise and fatigue   . Mental status alteration   . Schizophrenia [facility])     Patient Active Problem List   Diagnosis Date Noted  . Controlled type 2 diabetes mellitus without complication, without long-term current use of insulin (HCC) 04/19/2016  . Status post abdominal hysterectomy 05/01/2015  . Morbid obesity with body mass index (BMI) of 45.0 to 49.9 in adult [facility]) 05/01/2015  . Acute respiratory failure (HCC) 04/28/2015  . Allergic rhinitis 08/02/2014  . Arthropathia 08/02/2014  . CCF (congestive cardiac failure) (HCC) 08/02/2014  . Current tobacco use 08/02/2014  . Diabetic neuropathy (HCC) 08/02/2014  . Genital herpes 08/02/2014  . Acid reflux 08/02/2014  . BP (high blood pressure) 08/02/2014  . HLD  (hyperlipidemia) 08/02/2014  . Incontinence 08/02/2014  . Neuropathy (HCC) 08/02/2014  . Drug noncompliance 08/02/2014  . Adult BMI 30+ 08/02/2014  . Arthritis, degenerative 08/02/2014  . Delusional disorder (HCC) 08/02/2014  . Acute exacerbation of chronic paranoid schizophrenia (HCC) 08/02/2014  . Avitaminosis D 08/02/2014  . Can't get food down 07/16/2013    Past Surgical History:  Procedure Laterality Date  . ABDOMINAL HYSTERECTOMY     due to fibroids in 1997  . CHOLECYSTECTOMY    . COLONOSCOPY WITH PROPOFOL N/A 08/15/2015   Procedure: COLONOSCOPY WITH PROPOFOL;  Surgeon: Christena Deem, MD;  Location: [facility] ENDOSCOPY;  Service: Endoscopy;  Laterality: N/A;  . ESOPHAGOGASTRODUODENOSCOPY (EGD) WITH PROPOFOL N/A 08/15/2015   Procedure: ESOPHAGOGASTRODUODENOSCOPY (EGD) WITH PROPOFOL;  Surgeon: Christena Deem, MD;  Location: [facility] ENDOSCOPY;  Service: Endoscopy;  Laterality: N/A;  . FOOT SURGERY Right   . GANGLION CYST EXCISION    . TONSILLECTOMY    . UPPER GI ENDOSCOPY  07/31/13   mild chronic inflammation and reactive gastropathy-no need for another EGD repeat    Prior to Admission medications   Medication Sig Start Date End Date Taking? Authorizing Provider  albuterol (PROVENTIL HFA;VENTOLIN HFA) 108 (90 Base) MCG/ACT inhaler Inhale 1-2 puffs into the lungs every 6 (six) hours as needed for wheezing or shortness of breath. 03/13/15   Maple Hudson., MD  aspirin EC 81 MG tablet Take 81 mg by mouth daily.    Historical Provider, MD  benztropine (COGENTIN)  0.5 MG tablet Take 0.5 mg by mouth 2 (two) times daily. One tab in the morning and 2 tabs at bedtime    Historical Provider, MD  docusate sodium (COLACE) 100 MG capsule Take 100 mg by mouth 2 (two) times daily.    Historical Provider, MD  FluPHENAZine HCl (PROLIXIN PO) Take 25 mg by mouth. injection    Historical Provider, MD  fluticasone (FLONASE) 50 MCG/ACT nasal spray Place 2 sprays into both nostrils daily. 07/23/15    Richard Hulen Shouts., MD  furosemide (LASIX) 40 MG tablet Take 1 tablet (40 mg total) by mouth daily. 05/05/15   Richard Hulen Shouts., MD  gabapentin (NEURONTIN) 300 MG capsule Take 300 mg by mouth 3 (three) times daily.    Historical Provider, MD  glimepiride (AMARYL) 4 MG tablet Take 1 tablet (4 mg total) by mouth daily with breakfast. 10/21/15   Maple Hudson., MD  glucose blood (ACCU-CHEK AVIVA) test strip Check sugar once daily DX E11.9 08/13/15   Maple Hudson., MD  Lancet Devices [facility]) lancets Check sugar once daily, DX E11.9 08/13/15   Maple Hudson., MD  loratadine (CLARITIN) 10 MG tablet Take 1 tablet (10 mg total) by mouth daily. 07/07/15   Richard Hulen Shouts., MD  metFORMIN (GLUCOPHAGE) 500 MG tablet Take 1 tablet (500 mg total) by mouth 2 (two) times daily with a meal. 07/28/15   Maple Hudson., MD  ondansetron (ZOFRAN) 4 MG tablet Take 1 tablet (4 mg total) by mouth every 8 (eight) hours as needed for nausea or vomiting. 04/21/16   Trey Sailors, PA-C  pantoprazole (PROTONIX) 40 MG tablet Take 40 mg by mouth daily.    Historical Provider, MD  potassium chloride SA (K-DUR,KLOR-CON) 20 MEQ tablet Take 1 tablet (20 mEq total) by mouth 2 (two) times daily. 07/07/15   Richard Hulen Shouts., MD  pregabalin (LYRICA) 50 MG capsule Take 50 mg by mouth 3 (three) times daily.    Historical Provider, MD  ranitidine (ZANTAC) 150 MG tablet Take 1 tablet (150 mg total) by mouth 2 (two) times daily. 10/21/15   Richard Hulen Shouts., MD  simvastatin (ZOCOR) 10 MG tablet Take 1 tablet (10 mg total) by mouth daily. 11/19/15   Richard Hulen Shouts., MD  sucralfate (CARAFATE) 1 g tablet Take 1 tablet (1 g total) by mouth 4 (four) times daily -  with meals and at bedtime. 04/20/16   Richard Hulen Shouts., MD  terconazole (TERAZOL 3) 0.8 % vaginal cream Place 1 applicator vaginally at bedtime. 01/28/16   Richard Hulen Shouts., MD  triamterene-hydrochlorothiazide  (MAXZIDE-25) 37.5-25 MG tablet Take 1 tablet by mouth daily. 07/07/15   Richard Hulen Shouts., MD  valACYclovir (VALTREX) 1000 MG tablet Take 1 tablet (1,000 mg total) by mouth 2 (two) times daily. 11/13/15   Richard Hulen Shouts., MD    Allergies Cephalexin; Peroxide Greig Castilla peroxide]; and Shellfish allergy  Family History  Problem Relation Age of Onset  . Diabetes Mother   . Pulmonary embolism Mother   . Diabetes Father     Social History Social History  Substance Use Topics  . Smoking status: Current Some Day Smoker    Packs/day: 0.00    Types: Cigarettes  . Smokeless tobacco: Never Used     Comment: only smokes sometimes  . Alcohol use Yes     Comment: beer occasionally    Review of Systems Constitutional: No  fever/chills Eyes: No visual changes. ENT: No sore throat. Cardiovascular: Denies chest pain. Respiratory: Denies shortness of breath. Gastrointestinal: No abdominal pain.  No nausea, no vomiting.  No diarrhea.  No constipation. Genitourinary: Negative for dysuria. Musculoskeletal: Negative for back pain. Skin: Negative for rash. Neurological: Negative for headaches, focal weakness or numbness.  10-point ROS otherwise negative.  ____________________________________________   PHYSICAL EXAM:  VITAL SIGNS: ED Triage Vitals [04/30/16 1339]  Enc Vitals Group     BP      Pulse      Resp (!) 22     Temp      Temp Source Oral     SpO2      Weight      Height      Head Circumference      Peak Flow      Pain Score      Pain Loc      Pain Edu?      Excl. in GC?     Constitutional: Alert and oriented x 4 well appearing nontoxic no diaphoresis speaks in full, clear sentences Eyes: PERRL EOMI. Head: Atraumatic. Nose: No congestion/rhinnorhea. Mouth/Throat: No trismus Neck: No stridor.   Cardiovascular: Normal rate, regular rhythm. Grossly normal heart sounds.  Good peripheral circulation. Respiratory: Normal respiratory effort.  No retractions. Lungs  CTAB and moving good air Gastrointestinal: Soft nondistended nontender no rebound no guarding no peritonitis no McBurney's tenderness negative Rovsing's no costovertebral tenderness negative [name]'s Musculoskeletal: No lower extremity edema  for range of motion left hip and left knee extensor mechanism intact no effusion no erythema or warmth Neurologic:  Normal speech and language. No gross focal neurologic deficits are appreciated. Skin:  Skin is warm, dry and intact. No rash noted. Psychiatric: Clear developmental delay    ____________________________________________   DIFFERENTIAL  Diabetic ketoacidosis, asymptomatic hyperglycemia, hyper glycemic hyperosmolar state or urinary tract infection, pneumonia ____________________________________________   LABS (all labs ordered are listed, but only abnormal results are displayed)  Labs Reviewed  COMPREHENSIVE METABOLIC PANEL - Abnormal; Notable for the following:       Result Value   Sodium 130 (*)    Potassium 2.9 (*)    Chloride 83 (*)    CO2 34 (*)    Glucose, Bld 340 (*)    Creatinine, Ser 1.02 (*)    Total Bilirubin 1.3 (*)    GFR calc non Af Amer 58 (*)    All other components within normal limits  CBC WITH DIFFERENTIAL/PLATELET - Abnormal; Notable for the following:    RBC 6.05 (*)    MCV 67.1 (*)    MCH 21.3 (*)    MCHC 31.8 (*)    RDW 16.2 (*)    Monocytes Absolute 1.5 (*)    All other components within normal limits  URINALYSIS, COMPLETE (UACMP) WITH MICROSCOPIC - Abnormal; Notable for the following:    Color, Urine YELLOW (*)    APPearance CLEAR (*)    Glucose, UA 150 (*)    Bacteria, UA RARE (*)    Squamous Epithelial / LPF 0-5 (*)    All other components within normal limits  BLOOD GAS, VENOUS - Abnormal; Notable for the following:    pH, Ven 7.49 (*)    Bicarbonate 38.9 (*)    Acid-Base Excess 13.4 (*)    All other components within normal limits  GLUCOSE, CAPILLARY - Abnormal; Notable for the following:     Glucose-Capillary 300 (*)    All other components within normal  limits  BETA-HYDROXYBUTYRIC ACID  TROPONIN I    Normal anion gap no signs of acidosis __________________________________________  EKG  ED ECG REPORT I, Merrily Brittle, the attending physician, personally viewed and interpreted this ECG.  Date: 04/30/2016 Rate: 82 Rhythm: normal sinus rhythm QRS Axis: normal Intervals: Long QTC ST/T Wave abnormalities: T-wave inversion inferiorly with mild depression and lateral T-wave inversion this is different from her last EKG in May 2017 Conduction Disturbances: none Narrative Interpretation: Abnormal EKG  ____________________________________________  RADIOLOGY  Chest x-ray with no acute disease ____________________________________________   PROCEDURES  Procedure(s) performed: no  Procedures  Critical Care performed: no  ____________________________________________   INITIAL IMPRESSION / ASSESSMENT AND PLAN / ED COURSE  Pertinent labs & imaging results that were available during my care of the patient were reviewed by me and considered in my medical decision making (see chart for details).  The patient arrives very pleasant and well-appearing although with a very high glucose. Labs are pending.  Fortunately the patient's anion gap is not elevated and she has no signs of acidemia or acidosis. No signs of infection. At this point I am comfortable discharging her home with her home health as scheduled tomorrow. She is discharged home in improved condition.      ____________________________________________   FINAL CLINICAL IMPRESSION(S) / ED DIAGNOSES  Final diagnoses:  Hyperglycemia  Dehydration      NEW MEDICATIONS STARTED DURING THIS VISIT:  New Prescriptions   No medications on file     Note:  This document was prepared using Dragon voice recognition software and may include unintentional dictation errors.     Merrily Brittle,  MD 04/30/16 1524

## 2016-04-30 NOTE — ED Triage Notes (Addendum)
Pt to ED via EMS from home, per EMS pt is followed by home health nurse and was not seen yesterday, nurse called ems today for hyperglycemia, cbg 400s and tachypnea. Pt states she hasn't taken her meds in " a few days " due to fatigue. Pt A&Ox4, VS stable.

## 2016-04-30 NOTE — Discharge Instructions (Signed)
Please resume your home medications and take them as prescribed. Return to the emergency department for any concerns. Otherwise follow up with your primary care physician as scheduled.  It was a pleasure to take care of you today, and thank you for coming to our emergency department.  If you have any questions or concerns before leaving please ask the nurse to grab me and I'm more than happy to go through your aftercare instructions again.  If you were prescribed any opioid pain medication today such as Norco, Vicodin, Percocet, morphine, hydrocodone, or oxycodone please make sure you do not drive when you are taking this medication as it can alter your ability to drive safely.  If you have any concerns once you are home that you are not improving or are in fact getting worse before you can make it to your follow-up appointment, please do not hesitate to call 911 and come back for further evaluation.  Merrily Brittle MD  Results for orders placed or performed during the hospital encounter of 04/30/16  Comprehensive metabolic panel  Result Value Ref Range   Sodium 130 (L) 135 - 145 mmol/L   Potassium 2.9 (L) 3.5 - 5.1 mmol/L   Chloride 83 (L) 101 - 111 mmol/L   CO2 34 (H) 22 - 32 mmol/L   Glucose, Bld 340 (H) 65 - 99 mg/dL   BUN 10 6 - 20 mg/dL   Creatinine, Ser 4.09 (H) 0.44 - 1.00 mg/dL   Calcium 9.0 8.9 - 81.1 mg/dL   Total Protein 7.5 6.5 - 8.1 g/dL   Albumin 4.0 3.5 - 5.0 g/dL   AST 37 15 - 41 U/L   ALT 28 14 - 54 U/L   Alkaline Phosphatase 71 38 - 126 U/L   Total Bilirubin 1.3 (H) 0.3 - 1.2 mg/dL   GFR calc non Af Amer 58 (L) >60 mL/min   GFR calc Af Amer >60 >60 mL/min   Anion gap 13 5 - 15  CBC with Differential  Result Value Ref Range   WBC 7.5 3.6 - 11.0 K/uL   RBC 6.05 (H) 3.80 - 5.20 MIL/uL   Hemoglobin 12.9 12.0 - 16.0 g/dL   HCT 91.4 78.2 - 95.6 %   MCV 67.1 (L) 80.0 - 100.0 fL   MCH 21.3 (L) 26.0 - 34.0 pg   MCHC 31.8 (L) 32.0 - 36.0 g/dL   RDW 21.3 (H) 08.6 - 57.8  %   Platelets 291 150 - 440 K/uL   Neutrophils Relative % 57 %   Neutro Abs 4.3 1.4 - 6.5 K/uL   Lymphocytes Relative 19 %   Lymphs Abs 1.4 1.0 - 3.6 K/uL   Monocytes Relative 19 %   Monocytes Absolute 1.5 (H) 0.2 - 0.9 K/uL   Eosinophils Relative 4 %   Eosinophils Absolute 0.3 0 - 0.7 K/uL   Basophils Relative 1 %   Basophils Absolute 0.1 0 - 0.1 K/uL  Urinalysis, Complete w Microscopic  Result Value Ref Range   Color, Urine YELLOW (A) YELLOW   APPearance CLEAR (A) CLEAR   Specific Gravity, Urine 1.005 1.005 - 1.030   pH 6.0 5.0 - 8.0   Glucose, UA 150 (A) NEGATIVE mg/dL   Hgb urine dipstick NEGATIVE NEGATIVE   Bilirubin Urine NEGATIVE NEGATIVE   Ketones, ur NEGATIVE NEGATIVE mg/dL   Protein, ur NEGATIVE NEGATIVE mg/dL   Nitrite NEGATIVE NEGATIVE   Leukocytes, UA NEGATIVE NEGATIVE   RBC / HPF 0-5 0 - 5 RBC/hpf  WBC, UA 0-5 0 - 5 WBC/hpf   Bacteria, UA RARE (A) NONE SEEN   Squamous Epithelial / LPF 0-5 (A) NONE SEEN  Blood gas, venous  Result Value Ref Range   FIO2 PENDING    Delivery systems PENDING    pH, Ven 7.49 (H) 7.250 - 7.430   pCO2, Ven 51 44.0 - 60.0 mmHg   pO2, Ven 35.0 32.0 - 45.0 mmHg   Bicarbonate 38.9 (H) 20.0 - 28.0 mmol/L   Acid-Base Excess 13.4 (H) 0.0 - 2.0 mmol/L   O2 Saturation 73.1 %   Patient temperature 37.0    Collection site VEIN    Sample type VEIN   Beta-hydroxybutyric acid  Result Value Ref Range   Beta-Hydroxybutyric Acid 0.18 0.05 - 0.27 mmol/L  Glucose, capillary  Result Value Ref Range   Glucose-Capillary 300 (H) 65 - 99 mg/dL   Dg Chest 1 View  Result Date: 04/30/2016 CLINICAL DATA:  Cough, shortness of breath EXAM: CHEST 1 VIEW COMPARISON:  04/19/2016 FINDINGS: The heart size and mediastinal contours are within normal limits. Both lungs are clear. The visualized skeletal structures are unremarkable. IMPRESSION: No active disease. Electronically Signed   By: Elige Ko   On: 04/30/2016 14:17   Dg Chest 2 View  Result Date:  04/19/2016 CLINICAL DATA:  Cough and fever EXAM: CHEST  2 VIEW COMPARISON:  May 31, 2015 FINDINGS: Lungs are clear. Heart size and pulmonary vascularity are normal. No adenopathy. No bone lesions. IMPRESSION: No edema or consolidation. Electronically Signed   By: Bretta Bang III M.D.   On: 04/19/2016 15:50

## 2016-04-30 NOTE — ED Notes (Signed)
Signature pad not working.  Patient and family verbalized understanding of discharge instructions and has no further questions.

## 2016-04-30 NOTE — ED Notes (Signed)
Patient transported to X-ray 

## 2016-05-04 LAB — BLOOD GAS, VENOUS
Acid-Base Excess: 13.4 mmol/L — ABNORMAL HIGH (ref 0.0–2.0)
BICARBONATE: 38.9 mmol/L — AB (ref 20.0–28.0)
O2 SAT: 73.1 %
PATIENT TEMPERATURE: 37
pCO2, Ven: 51 mmHg (ref 44.0–60.0)
pH, Ven: 7.49 — ABNORMAL HIGH (ref 7.250–7.430)
pO2, Ven: 35 mmHg (ref 32.0–45.0)

## 2016-05-14 ENCOUNTER — Other Ambulatory Visit: Payer: Self-pay | Admitting: Family Medicine

## 2016-05-14 MED ORDER — FUROSEMIDE 40 MG PO TABS: 40.0000 mg | ORAL_TABLET | Freq: Every day | ORAL | 3 refills | Status: DC

## 2016-05-14 NOTE — Telephone Encounter (Signed)
[facility] faxed a request on the following medication. Thanks CC  furosemide (LASIX) 40 MG tablet  Take one Tablet by mouth daily.

## 2016-05-14 NOTE — Telephone Encounter (Signed)
Done-aa 

## 2016-05-17 ENCOUNTER — Ambulatory Visit (INDEPENDENT_AMBULATORY_CARE_PROVIDER_SITE_OTHER): Payer: Medicaid Other | Admitting: Podiatry

## 2016-05-17 DIAGNOSIS — B351 Tinea unguium: Secondary | ICD-10-CM | POA: Diagnosis not present

## 2016-05-17 DIAGNOSIS — E1149 Type 2 diabetes mellitus with other diabetic neurological complication: Secondary | ICD-10-CM

## 2016-05-17 DIAGNOSIS — M79676 Pain in unspecified toe(s): Secondary | ICD-10-CM

## 2016-05-17 NOTE — Progress Notes (Signed)
Complaint:  Visit Type: Patient returns to my office for continued preventative foot care services. Complaint: Patient states" my nails have grown long and thick and become painful to walk and wear shoes" Patient has been diagnosed with DM with neuropathy. The patient presents for preventative foot care services. No changes to ROS  Podiatric Exam: Vascular: dorsalis pedis and posterior tibial pulses are absent  bilateral. Capillary return is immediate. Temperature gradient is WNL. Skin turgor WNL  Sensorium: Diminished  Semmes Weinstein monofilament test. Normal tactile sensation bilaterally. Nail Exam: Pt has thick disfigured discolored nails with subungual debris noted bilateral entire nail hallux through fifth toenails Ulcer Exam: There is no evidence of ulcer or pre-ulcerative changes or infection. Orthopedic Exam: Muscle tone and strength are WNL. No limitations in general ROM. No crepitus or effusions noted. Foot type and digits show no abnormalities. Bony prominences are unremarkable. Skin: No Porokeratosis. No infection or ulcers  Diagnosis:  Onychomycosis, , Pain in right toe, pain in left toes  Treatment & Plan Procedures and Treatment: Consent by patient was obtained for treatment procedures. The patient understood the discussion of treatment and procedures well. All questions were answered thoroughly reviewed. Debridement of mycotic and hypertrophic toenails, 1 through 5 bilateral and clearing of subungual debris. No ulceration, no infection noted.  Return Visit-Office Procedure: Patient instructed to return to the office for a follow up visit 3 months for continued evaluation and treatment.    Sharrell Krawiec DPM 

## 2016-05-24 ENCOUNTER — Telehealth: Payer: Self-pay | Admitting: Family Medicine

## 2016-05-24 ENCOUNTER — Other Ambulatory Visit: Payer: Self-pay

## 2016-05-24 DIAGNOSIS — J45909 Unspecified asthma, uncomplicated: Secondary | ICD-10-CM

## 2016-05-24 MED ORDER — ALBUTEROL SULFATE HFA 108 (90 BASE) MCG/ACT IN AERS: 1.0000 | INHALATION_SPRAY | Freq: Four times a day (QID) | RESPIRATORY_TRACT | 3 refills | Status: DC | PRN

## 2016-05-24 NOTE — Telephone Encounter (Signed)
Please review the message about yeast infection thank you-aa

## 2016-05-24 NOTE — Telephone Encounter (Signed)
Pt would like a refill on her   albuterol (PROVENTIL HFA;VENTOLIN HFA) 108 (90 Base) MCG/ACT inhaler  She also has another yeast infection and would like diflucan pills.  [facility].  Please deliver!  Thanks, Barth Kirks

## 2016-05-27 NOTE — Telephone Encounter (Signed)
Diflucan 150 mg daily for 2 days.

## 2016-05-27 NOTE — Telephone Encounter (Signed)
[facility] is requesting a Rx for a yeast infection.   WU#[phone]/NFCB#[phone]/MW

## 2016-05-27 NOTE — Telephone Encounter (Signed)
Please review-aa 

## 2016-05-28 MED ORDER — FLUCONAZOLE 150 MG PO TABS: 150.0000 mg | ORAL_TABLET | Freq: Once | ORAL | 0 refills | Status: AC

## 2016-05-28 NOTE — Telephone Encounter (Signed)
RX sent in-aa 

## 2016-06-14 ENCOUNTER — Telehealth: Payer: Self-pay | Admitting: Family Medicine

## 2016-06-14 ENCOUNTER — Other Ambulatory Visit: Payer: Self-pay | Admitting: Physician Assistant

## 2016-06-14 DIAGNOSIS — R112 Nausea with vomiting, unspecified: Secondary | ICD-10-CM

## 2016-06-14 NOTE — Telephone Encounter (Signed)
[facility] request on the following medication. Thanks CC  ondansetron (ZOFRAN) 4 MG tablet  Take 1 tablet by mouth every 8 hours as needed for

## 2016-06-14 NOTE — Telephone Encounter (Signed)
ERROR

## 2016-06-14 NOTE — Telephone Encounter (Signed)
Can we please call patient and see if she's using this medication anymore? I gave this to her for an acute issues. Thanks.

## 2016-06-14 NOTE — Telephone Encounter (Signed)
Has she been seen? She does have recurrent Gi issues but I generally will only send this medication in if I've seen her, which I haven't in a while. She has poorly controlled diabetes and needs to be checking her sugars. Probably needs office visit if this is a problem with her, preferably with Dr. Sullivan LoneGilbert who knows her. She should go to ER if she is having severe abdominal pain, fast breathing, frequent urination.

## 2016-06-14 NOTE — Telephone Encounter (Signed)
Pt reports she still needs this medication, as she vomited earlier today. Allene DillonEmily Drozdowski, CMA

## 2016-06-15 NOTE — Telephone Encounter (Signed)
No answer and voicemail not set up yet-aa

## 2016-06-16 ENCOUNTER — Telehealth: Payer: Self-pay | Admitting: Family Medicine

## 2016-06-16 NOTE — Telephone Encounter (Signed)
Pt states she is taking a big white pill and is it too big for her to swallow.  Pt does not know the name but pt says it helps with her stomach.  Pt is asking if she can this in liquid or a smaller pill.  [facility].  CB#[phone]/MW

## 2016-06-16 NOTE — Telephone Encounter (Signed)
I ma guessing patient is talking about Carafate maybe?-aa

## 2016-06-16 NOTE — Telephone Encounter (Signed)
Carafate can be dissolved in water.

## 2016-06-16 NOTE — Telephone Encounter (Signed)
Pt advised-aa 

## 2016-06-22 ENCOUNTER — Other Ambulatory Visit: Payer: Self-pay | Admitting: Family Medicine

## 2016-06-22 DIAGNOSIS — B3731 Acute candidiasis of vulva and vagina: Secondary | ICD-10-CM

## 2016-06-22 DIAGNOSIS — N898 Other specified noninflammatory disorders of vagina: Secondary | ICD-10-CM

## 2016-06-22 DIAGNOSIS — B373 Candidiasis of vulva and vagina: Secondary | ICD-10-CM

## 2016-06-22 MED ORDER — ONDANSETRON HCL 4 MG PO TABS: 4.0000 mg | ORAL_TABLET | Freq: Three times a day (TID) | ORAL | 5 refills | Status: DC | PRN

## 2016-06-22 NOTE — Telephone Encounter (Signed)
Pt contacted office for refill request on the following medications:  [facility].  CB#[phone]/MW  terconazole (TERAZOL 3) 0.8 % vaginal cream  Natroxen 500mg 

## 2016-06-22 NOTE — Telephone Encounter (Signed)
[facility] faxed office for refill request on the following medications: ondansetron (ZOFRAN) 4 MG tablet  Please advise. Thanks TNP

## 2016-06-22 NOTE — Telephone Encounter (Signed)
[facility] called to see the status of pt's refill request for terconazole (TERAZOL 3) 0.8 % vaginal cream and something for the pain. Please advise. Thanks TNP

## 2016-06-23 ENCOUNTER — Telehealth: Payer: Self-pay | Admitting: Family Medicine

## 2016-06-23 MED ORDER — TERCONAZOLE 0.8 % VA CREA: 1.0000 | TOPICAL_CREAM | Freq: Every day | VAGINAL | 0 refills | Status: DC

## 2016-06-23 NOTE — Telephone Encounter (Signed)
375 BID prn.#[id number].

## 2016-06-23 NOTE — Telephone Encounter (Signed)
Pt says she use to take naproxen for knee pain.  She would like a new Rx. For naproxen.  [facility]  Thanks teri.

## 2016-07-01 ENCOUNTER — Encounter: Payer: Self-pay | Admitting: Physician Assistant

## 2016-07-01 ENCOUNTER — Ambulatory Visit (INDEPENDENT_AMBULATORY_CARE_PROVIDER_SITE_OTHER): Payer: Medicaid Other | Admitting: Physician Assistant

## 2016-07-01 VITALS — BP 122/64 | HR 72 | Temp 98.4°F | Resp 16 | Wt 305.0 lb

## 2016-07-01 DIAGNOSIS — M25562 Pain in left knee: Secondary | ICD-10-CM | POA: Diagnosis not present

## 2016-07-01 DIAGNOSIS — G8929 Other chronic pain: Secondary | ICD-10-CM

## 2016-07-01 NOTE — Progress Notes (Signed)
Patient: Christy [name] Female    DOB: [birth year]   61 y.o.   MRN: [id number] Visit Date: 07/01/2016  Today's Provider: Trey Sailors, PA-C   Chief Complaint  Patient presents with  . Knee Pain   Subjective:      Christy [name] is a 61 y/o woman with history significant for obesity, heart failure, uncontrolled diabetes, intermittent elevated transaminases, arthritis of her knee presenting today for left knee pain ongoing for years with recent worsening. It hurts when she walks and with most motions. It creaks and pops. She was seen in the ER on 04/30/2016 and prescribed Voltaren gel. She says this has not given her relief. She has seen orthopedic surgery last winter and got a cortisone injection which was effective for a few weeks. She states at one point the orthopedist told her she needs a knee replacement.   Knee Pain   There was no injury mechanism. The pain is present in the left knee. The quality of the pain is described as stabbing. The pain has been worsening since onset. She reports no foreign bodies present. The symptoms are aggravated by movement, weight bearing and palpation. She has tried ice and heat for the symptoms. The treatment provided no relief.       Allergies  Allergen Reactions  . Cephalexin Itching  . Peroxide [Hydrogen Peroxide] Swelling  . Shellfish Allergy Nausea And Vomiting     Current Outpatient Prescriptions:  .  albuterol (PROVENTIL HFA;VENTOLIN HFA) 108 (90 Base) MCG/ACT inhaler, Inhale 1-2 puffs into the lungs every 6 (six) hours as needed for wheezing or shortness of breath., Disp: 3 Inhaler, Rfl: 3 .  aspirin EC 81 MG tablet, Take 81 mg by mouth daily., Disp: , Rfl:  .  benztropine (COGENTIN) 0.5 MG tablet, Take 0.5 mg by mouth 2 (two) times daily. One tab in the morning and 2 tabs at bedtime, Disp: , Rfl:  .  diclofenac sodium (VOLTAREN) 1 % GEL, Apply 4 g topically 4 (four) times daily., Disp: 100 g, Rfl: 0 .  docusate sodium (COLACE) 100 MG  capsule, Take 100 mg by mouth 2 (two) times daily., Disp: , Rfl:  .  FluPHENAZine HCl (PROLIXIN PO), Take 25 mg by mouth. injection, Disp: , Rfl:  .  fluticasone (FLONASE) 50 MCG/ACT nasal spray, Place 2 sprays into both nostrils daily., Disp: 16 g, Rfl: 6 .  furosemide (LASIX) 40 MG tablet, Take 1 tablet (40 mg total) by mouth daily., Disp: 90 tablet, Rfl: 3 .  gabapentin (NEURONTIN) 300 MG capsule, Take 300 mg by mouth 3 (three) times daily., Disp: , Rfl:  .  glimepiride (AMARYL) 4 MG tablet, Take 1 tablet (4 mg total) by mouth daily with breakfast., Disp: 30 tablet, Rfl: 12 .  glucose blood (ACCU-CHEK AVIVA) test strip, Check sugar once daily DX E11.9, Disp: 50 each, Rfl: 12 .  Lancet Devices (ACCU-CHEK SOFTCLIX) lancets, Check sugar once daily, DX E11.9, Disp: 50 each, Rfl: 12 .  loratadine (CLARITIN) 10 MG tablet, Take 1 tablet (10 mg total) by mouth daily., Disp: 30 tablet, Rfl: 12 .  metFORMIN (GLUCOPHAGE) 500 MG tablet, Take 1 tablet (500 mg total) by mouth 2 (two) times daily with a meal., Disp: 180 tablet, Rfl: 3 .  ondansetron (ZOFRAN) 4 MG tablet, Take 1 tablet (4 mg total) by mouth every 8 (eight) hours as needed for nausea or vomiting., Disp: 60 tablet, Rfl: 5 .  pantoprazole (PROTONIX) 40 MG tablet,  Take 40 mg by mouth daily., Disp: , Rfl:  .  potassium chloride SA (K-DUR,KLOR-CON) 20 MEQ tablet, Take 1 tablet (20 mEq total) by mouth 2 (two) times daily., Disp: 60 tablet, Rfl: 12 .  pregabalin (LYRICA) 50 MG capsule, Take 50 mg by mouth 3 (three) times daily., Disp: , Rfl:  .  ranitidine (ZANTAC) 150 MG tablet, Take 1 tablet (150 mg total) by mouth 2 (two) times daily., Disp: 60 tablet, Rfl: 12 .  simvastatin (ZOCOR) 10 MG tablet, Take 1 tablet (10 mg total) by mouth daily., Disp: 30 tablet, Rfl: 12 .  sucralfate (CARAFATE) 1 g tablet, Take 1 tablet (1 g total) by mouth 4 (four) times daily -  with meals and at bedtime., Disp: 120 tablet, Rfl: 12 .  terconazole (TERAZOL 3) 0.8 %  vaginal cream, Place 1 applicator vaginally at bedtime., Disp: 20 g, Rfl: 0 .  triamterene-hydrochlorothiazide (MAXZIDE-25) 37.5-25 MG tablet, Take 1 tablet by mouth daily., Disp: 30 tablet, Rfl: 12 .  valACYclovir (VALTREX) 1000 MG tablet, Take 1 tablet (1,000 mg total) by mouth 2 (two) times daily., Disp: 30 tablet, Rfl: 12  Review of Systems  Constitutional: Negative.   Musculoskeletal: Positive for arthralgias (Left knee pain) and gait problem (Uses a walker). Negative for back pain, joint swelling, myalgias, neck pain and neck stiffness.  Neurological: Negative for dizziness, light-headedness and headaches.    Social History  Substance Use Topics  . Smoking status: Current Some Day Smoker    Packs/day: 0.00    Types: Cigarettes  . Smokeless tobacco: Never Used     Comment: only smokes sometimes  . Alcohol use Yes     Comment: beer occasionally   Objective:   BP 122/64 (BP Location: Left Arm, Patient Position: Sitting, Cuff Size: Large)   Pulse 72   Temp 98.4 F (36.9 C) (Oral)   Resp 16   Wt (!) 305 lb (138.3 kg)   BMI 45.04 kg/m  Vitals:   07/01/16 1450  BP: 122/64  Pulse: 72  Resp: 16  Temp: 98.4 F (36.9 C)  TempSrc: Oral  Weight: (!) 305 lb (138.3 kg)     Physical Exam  Constitutional: She is oriented to person, place, and time. She appears well-developed and well-nourished. No distress.  Cardiovascular: Normal rate.   Pulmonary/Chest: Effort normal.  Musculoskeletal: She exhibits edema and tenderness. She exhibits no deformity.  Left Knee with crepitus and tenderness along joint line. No instability.   Neurological: She is alert and oriented to person, place, and time.  Skin: Skin is warm and dry.  Psychiatric: She has a normal mood and affect. Her behavior is normal.        Assessment & Plan:     1. Chronic pain of left knee  Patient not really a candidate for chronic NSAIDs 2/2 medical comorbidities. Also has history of elevated transaminases and  hesitant to provide tylenol for this reason. Recommend her to follow up with orthopedic doctor.  The entirety of the information documented in the History of Present Illness, Review of Systems and Physical Exam were personally obtained by me. Portions of this information were initially documented by Kavin LeechLaura Walsh, CMA and reviewed by me for thoroughness and accuracy.   Return if symptoms worsen or fail to improve.         Trey SailorsAdriana M Pollak, PA-C  [facility]

## 2016-07-01 NOTE — Patient Instructions (Signed)

## 2016-07-06 ENCOUNTER — Other Ambulatory Visit: Payer: Self-pay | Admitting: Family Medicine

## 2016-07-06 NOTE — Telephone Encounter (Addendum)
[facility] faxed a refill request on the following medications:    triamterene-hydrochlorothiazide (MAXZIDE-25) 37.5-25 MG tablet.  Take 1 tablet by mouth each day.    potassium chloride SA (K-DUR,KLOR-CON) 20 MEQ tablet.  Take 1 tablet by mouth twice daily.  loratadine (CLARITIN) 10 MG tablet.  Take 1 tablet by mouth daily.  metFORMIN (GLUCOPHAGE) 500 MG tablet.  Take 1 tablet 2 times daily with a meal.   [facility]/MW

## 2016-07-06 NOTE — Telephone Encounter (Signed)
Ok to refill 

## 2016-07-07 MED ORDER — TRIAMTERENE-HCTZ 37.5-25 MG PO TABS: 1.0000 | ORAL_TABLET | Freq: Every day | ORAL | 12 refills | Status: DC

## 2016-07-08 MED ORDER — METFORMIN HCL 500 MG PO TABS: 500.0000 mg | ORAL_TABLET | Freq: Two times a day (BID) | ORAL | 3 refills | Status: DC

## 2016-07-08 MED ORDER — POTASSIUM CHLORIDE CRYS ER 20 MEQ PO TBCR: 20.0000 meq | EXTENDED_RELEASE_TABLET | Freq: Two times a day (BID) | ORAL | 12 refills | Status: DC

## 2016-07-08 MED ORDER — LORATADINE 10 MG PO TABS: 10.0000 mg | ORAL_TABLET | Freq: Every day | ORAL | 12 refills | Status: DC

## 2016-07-08 NOTE — Addendum Note (Signed)
Addended by: Anson OregonPRESLEY, Katalyn Matin L on: 07/08/2016 11:41 AM   Modules accepted: Orders

## 2016-07-08 NOTE — Telephone Encounter (Signed)
yes

## 2016-07-08 NOTE — Telephone Encounter (Signed)
Meds sent into the pharmacy.

## 2016-07-22 ENCOUNTER — Other Ambulatory Visit: Payer: Self-pay | Admitting: Gastroenterology

## 2016-07-22 DIAGNOSIS — R1084 Generalized abdominal pain: Secondary | ICD-10-CM

## 2016-08-02 ENCOUNTER — Ambulatory Visit: Admission: RE | Admit: 2016-08-02 | Payer: Medicaid Other | Source: Ambulatory Visit

## 2016-08-06 ENCOUNTER — Ambulatory Visit
Admission: RE | Admit: 2016-08-06 | Discharge: 2016-08-06 | Disposition: A | Discharge status: Home / Self Care | Payer: Medicaid Other | Source: Ambulatory Visit | Attending: Gastroenterology | Admitting: Gastroenterology

## 2016-08-06 DIAGNOSIS — R160 Hepatomegaly, not elsewhere classified: Secondary | ICD-10-CM | POA: Insufficient documentation

## 2016-08-06 DIAGNOSIS — R1084 Generalized abdominal pain: Secondary | ICD-10-CM | POA: Diagnosis present

## 2016-08-06 DIAGNOSIS — K429 Umbilical hernia without obstruction or gangrene: Secondary | ICD-10-CM | POA: Insufficient documentation

## 2016-08-06 DIAGNOSIS — K760 Fatty (change of) liver, not elsewhere classified: Secondary | ICD-10-CM | POA: Diagnosis not present

## 2016-08-06 MED ORDER — IOPAMIDOL (ISOVUE-300) INJECTION 61%
100.0000 mL | Freq: Once | INTRAVENOUS | Status: AC | PRN
  Administered 2016-08-06: 100 mL via INTRAVENOUS

## 2016-08-16 ENCOUNTER — Ambulatory Visit: Payer: Medicaid Other | Admitting: Podiatry

## 2016-09-03 ENCOUNTER — Other Ambulatory Visit: Payer: Self-pay | Admitting: Family Medicine

## 2016-09-03 NOTE — Telephone Encounter (Signed)
[facility] faxed a request on the following medication. Thanks CC  furosemide (LASIX) 40 MG tablet  >Take one tablet by mouth daily.

## 2016-09-07 MED ORDER — FUROSEMIDE 40 MG PO TABS: 40.0000 mg | ORAL_TABLET | Freq: Every day | ORAL | 3 refills | Status: DC

## 2016-09-08 ENCOUNTER — Ambulatory Visit (INDEPENDENT_AMBULATORY_CARE_PROVIDER_SITE_OTHER): Payer: Medicaid Other | Admitting: Family Medicine

## 2016-09-08 ENCOUNTER — Encounter: Payer: Self-pay | Admitting: Family Medicine

## 2016-09-08 VITALS — BP 124/68 | HR 80 | Temp 97.6°F | Resp 16 | Wt 295.0 lb

## 2016-09-08 DIAGNOSIS — M25562 Pain in left knee: Secondary | ICD-10-CM | POA: Diagnosis not present

## 2016-09-08 DIAGNOSIS — G8929 Other chronic pain: Secondary | ICD-10-CM

## 2016-09-08 DIAGNOSIS — R110 Nausea: Secondary | ICD-10-CM | POA: Diagnosis not present

## 2016-09-08 DIAGNOSIS — R1084 Generalized abdominal pain: Secondary | ICD-10-CM

## 2016-09-08 DIAGNOSIS — R109 Unspecified abdominal pain: Secondary | ICD-10-CM | POA: Diagnosis not present

## 2016-09-08 DIAGNOSIS — E1140 Type 2 diabetes mellitus with diabetic neuropathy, unspecified: Secondary | ICD-10-CM

## 2016-09-08 DIAGNOSIS — L298 Other pruritus: Secondary | ICD-10-CM | POA: Diagnosis not present

## 2016-09-08 DIAGNOSIS — N898 Other specified noninflammatory disorders of vagina: Secondary | ICD-10-CM

## 2016-09-08 DIAGNOSIS — B372 Candidiasis of skin and nail: Secondary | ICD-10-CM

## 2016-09-08 MED ORDER — FLUCONAZOLE 100 MG PO TABS: 100.0000 mg | ORAL_TABLET | Freq: Every day | ORAL | 0 refills | Status: DC

## 2016-09-08 MED ORDER — DICYCLOMINE HCL 10 MG PO CAPS: 10.0000 mg | ORAL_CAPSULE | Freq: Two times a day (BID) | ORAL | 11 refills | Status: DC

## 2016-09-08 MED ORDER — IBUPROFEN 600 MG PO TABS: 600.0000 mg | ORAL_TABLET | Freq: Three times a day (TID) | ORAL | 0 refills | Status: DC | PRN

## 2016-09-08 MED ORDER — MICONAZOLE NITRATE 2 % EX CREA: 1.0000 "application " | TOPICAL_CREAM | Freq: Two times a day (BID) | CUTANEOUS | 0 refills | Status: DC

## 2016-09-08 MED ORDER — SUCRALFATE 1 G PO TABS: 1.0000 g | ORAL_TABLET | Freq: Two times a day (BID) | ORAL | 12 refills | Status: DC

## 2016-09-08 NOTE — Patient Instructions (Addendum)
Stop Etolalac and start Ibuoprfen and take sucarafate and Dicyclomine to twice daily. Stop potassium and Valacyclovir.

## 2016-09-08 NOTE — Progress Notes (Signed)
Subjective:  HPI Pt and her family are here today to discuss her medications and to see if she can be cut back on some of her medications. She reports that she has been complaining of her stomach hurting all the time and her left knee, she is in a lot of pain with that. She also has the rash underneath her breast that is itching and red, and has an odor to it. She is also still having the same vaginal symptoms she that has had for a while. She was given Terazol vaginal cream and has not helped it.   Prior to Admission medications   Medication Sig Start Date End Date Taking? Authorizing Provider  albuterol (PROVENTIL HFA;VENTOLIN HFA) 108 (90 Base) MCG/ACT inhaler Inhale 1-2 puffs into the lungs every 6 (six) hours as needed for wheezing or shortness of breath. 05/24/16   Jerrol Banana., MD  aspirin EC 81 MG tablet Take 81 mg by mouth daily.    [provider]  benztropine (COGENTIN) 0.5 MG tablet Take 0.5 mg by mouth 2 (two) times daily. One tab in the morning and 2 tabs at bedtime    [provider]  diclofenac sodium (VOLTAREN) 1 % GEL Apply 4 g topically 4 (four) times daily. 04/30/16   Nance Pear, MD  docusate sodium (COLACE) 100 MG capsule Take 100 mg by mouth 2 (two) times daily.    [provider]  FluPHENAZine HCl (PROLIXIN PO) Take 25 mg by mouth. injection    [provider]  fluticasone (FLONASE) 50 MCG/ACT nasal spray Place 2 sprays into both nostrils daily. 07/23/15   Jerrol Banana., MD  furosemide (LASIX) 40 MG tablet Take 1 tablet (40 mg total) by mouth daily. 09/07/16   Jerrol Banana., MD  gabapentin (NEURONTIN) 300 MG capsule Take 300 mg by mouth 3 (three) times daily.    [provider]  glimepiride (AMARYL) 4 MG tablet Take 1 tablet (4 mg total) by mouth daily with breakfast. 10/21/15   Jerrol Banana., MD  glucose blood (ACCU-CHEK AVIVA) test strip Check sugar once daily DX E11.9 08/13/15   Jerrol Banana., MD  Lancet Devices [facility]) lancets Check sugar once daily, DX E11.9 08/13/15   Jerrol Banana., MD  loratadine (CLARITIN) 10 MG tablet Take 1 tablet (10 mg total) by mouth daily. 07/08/16   Jerrol Banana., MD  metFORMIN (GLUCOPHAGE) 500 MG tablet Take 1 tablet (500 mg total) by mouth 2 (two) times daily with a meal. 07/08/16   Jerrol Banana., MD  ondansetron (ZOFRAN) 4 MG tablet Take 1 tablet (4 mg total) by mouth every 8 (eight) hours as needed for nausea or vomiting. 06/22/16   Jerrol Banana., MD  pantoprazole (PROTONIX) 40 MG tablet Take 40 mg by mouth daily.    [provider]  potassium chloride SA (K-DUR,KLOR-CON) 20 MEQ tablet Take 1 tablet (20 mEq total) by mouth 2 (two) times daily. 07/08/16   Jerrol Banana., MD  pregabalin (LYRICA) 50 MG capsule Take 50 mg by mouth 3 (three) times daily.    [provider]  ranitidine (ZANTAC) 150 MG tablet Take 1 tablet (150 mg total) by mouth 2 (two) times daily. 10/21/15   Jerrol Banana., MD  simvastatin (ZOCOR) 10 MG tablet Take 1 tablet (10 mg total) by mouth daily. 11/19/15   Jerrol Banana., MD  sucralfate (  CARAFATE) 1 g tablet Take 1 tablet (1 g total) by mouth 4 (four) times daily -  with meals and at bedtime. 04/20/16   Jerrol Banana., MD  terconazole (TERAZOL 3) 0.8 % vaginal cream Place 1 applicator vaginally at bedtime. 06/23/16   Jerrol Banana., MD  triamterene-hydrochlorothiazide (MAXZIDE-25) 37.5-25 MG tablet Take 1 tablet by mouth daily. 07/07/16   Jerrol Banana., MD  valACYclovir (VALTREX) 1000 MG tablet Take 1 tablet (1,000 mg total) by mouth 2 (two) times daily. 11/13/15   Jerrol Banana., MD    Patient Active Problem List   Diagnosis Date Noted  . Controlled type 2 diabetes mellitus without complication, without long-term current use of insulin ([place]) 04/19/2016  . Status post abdominal hysterectomy 05/01/2015  .  Morbid obesity with body mass index (BMI) of 45.0 to 49.9 in adult [facility]) 05/01/2015  . Acute respiratory failure ([place]) 04/28/2015  . Allergic rhinitis 08/02/2014  . Arthropathia 08/02/2014  . CCF (congestive cardiac failure) ([place]) 08/02/2014  . Current tobacco use 08/02/2014  . Diabetic neuropathy ([place]) 08/02/2014  . Genital herpes 08/02/2014  . Acid reflux 08/02/2014  . BP (high blood pressure) 08/02/2014  . HLD (hyperlipidemia) 08/02/2014  . Incontinence 08/02/2014  . Neuropathy 08/02/2014  . Drug noncompliance 08/02/2014  . Adult BMI 30+ 08/02/2014  . Arthritis, degenerative 08/02/2014  . Delusional disorder ([place]) 08/02/2014  . Acute exacerbation of chronic paranoid schizophrenia ([place]) 08/02/2014  . Avitaminosis D 08/02/2014  . Can't get food down 07/16/2013    Past Medical History:  Diagnosis Date  . Arthritis   . Asthma   . Diabetes mellitus without complication [facility])    Patient takes Metformin  . Edema   . Genital herpes   . GERD (gastroesophageal reflux disease)   . Hyperlipidemia   . Hypertension   . Malaise and fatigue   . Mental status alteration   . Schizophrenia [facility])     Social History   Social History  . Marital status: Single    Spouse name: N/A  . Number of children: N/A  . Years of education: N/A   Occupational History  . Not on file.   Social History Main Topics  . Smoking status: Current Some Day Smoker    Packs/day: 0.00    Types: Cigarettes  . Smokeless tobacco: Never Used     Comment: only smokes sometimes  . Alcohol use Yes     Comment: beer occasionally  . Drug use: No  . Sexual activity: Yes   Other Topics Concern  . Not on file   Social History Narrative  . No narrative on file    Allergies  Allergen Reactions  . Cephalexin Itching  . Peroxide [Hydrogen Peroxide] Swelling  . Shellfish Allergy Nausea And Vomiting    Review of Systems  Constitutional: Negative.   HENT: Negative.   Eyes: Negative.   Respiratory:  Negative.   Cardiovascular: Negative.   Gastrointestinal: Positive for abdominal pain.  Genitourinary: Negative.   Musculoskeletal: Positive for joint pain.  Skin: Negative.   Neurological: Positive for tremors.  Endo/Heme/Allergies: Negative.   Psychiatric/Behavioral: Negative.     Immunization History  Administered Date(s) Administered  . Td 11/10/2000    Objective:  BP 124/68 (BP Location: Left Arm, Patient Position: Sitting, Cuff Size: Large)   Pulse 80   Temp 97.6 F (36.4 C) (Oral)   Resp 16   Wt 295 lb (133.8 kg)   SpO2 94%   BMI  43.56 kg/m   Physical Exam  Constitutional: She is oriented to person, place, and time and well-developed, well-nourished, and in no distress.  Eyes: Pupils are equal, round, and reactive to light. Conjunctivae and EOM are normal.  Neck: Normal range of motion. Neck supple.  Cardiovascular: Normal rate, regular rhythm, normal heart sounds and intact distal pulses.   Pulmonary/Chest: Effort normal and breath sounds normal.  Musculoskeletal: Normal range of motion. She exhibits tenderness (medical joint line left knee and petallar tendon).  Neurological: She is alert and oriented to person, place, and time. She has normal reflexes. Gait normal. GCS score is 15.  Skin: Skin is warm and dry.  Psychiatric: Mood and memory normal.    Lab Results  Component Value Date   WBC 7.5 04/30/2016   HGB 12.9 04/30/2016   HCT 40.5 04/30/2016   PLT 291 04/30/2016   GLUCOSE 340 (H) 04/30/2016   CHOL 182 04/15/2015   TRIG 215 (H) 04/15/2015   HDL 46 04/15/2015   LDLCALC 93 04/15/2015   TSH 4.680 (H) 04/15/2015   HGBA1C 8.3 02/10/2015    CMP     Component Value Date/Time   NA 130 (L) 04/30/2016 1341   NA 130 (L) 04/21/2016 1434   NA 134 (L) 03/16/2013 1256   K 2.9 (L) 04/30/2016 1341   K 4.0 03/16/2013 1256   CL 83 (L) 04/30/2016 1341   CL 99 03/16/2013 1256   CO2 34 (H) 04/30/2016 1341   CO2 29 03/16/2013 1256   GLUCOSE 340 (H) 04/30/2016  1341   GLUCOSE 145 (H) 03/16/2013 1256   BUN 10 04/30/2016 1341   BUN 11 04/21/2016 1434   BUN 14 03/16/2013 1256   CREATININE 1.02 (H) 04/30/2016 1341   CREATININE 0.84 03/16/2013 1256   CALCIUM 9.0 04/30/2016 1341   CALCIUM 8.8 03/16/2013 1256   PROT 7.5 04/30/2016 1341   PROT 6.8 04/21/2016 1434   PROT 7.2 08/11/2012 1652   ALBUMIN 4.0 04/30/2016 1341   ALBUMIN 4.2 04/21/2016 1434   ALBUMIN 3.8 08/11/2012 1652   AST 37 04/30/2016 1341   AST 19 08/11/2012 1652   ALT 28 04/30/2016 1341   ALT 19 08/11/2012 1652   ALKPHOS 71 04/30/2016 1341   ALKPHOS 82 08/11/2012 1652   BILITOT 1.3 (H) 04/30/2016 1341   BILITOT 0.4 04/21/2016 1434   BILITOT 0.6 08/11/2012 1652   GFRNONAA 58 (L) 04/30/2016 1341   GFRNONAA >60 03/16/2013 1256   GFRAA >60 04/30/2016 1341   GFRAA >60 03/16/2013 1256    Assessment and Plan :  1. Type 2 diabetes mellitus with diabetic neuropathy, without long-term current use of insulin ([place])   2. Vaginal itching  - miconazole (MICOTIN) 2 % cream; Apply 1 application topically 2 (two) times daily.  Dispense: 28.35 g; Refill: 0 - fluconazole (DIFLUCAN) 100 MG tablet; Take 1 tablet (100 mg total) by mouth daily. For 3 days  Dispense: 3 tablet; Refill: 0  3. Generalized abdominal pain  - dicyclomine (BENTYL) 10 MG capsule; Take 1 capsule (10 mg total) by mouth 2 (two) times daily.  Dispense: 60 capsule; Refill: 11  4. Nausea  - sucralfate (CARAFATE) 1 g tablet; Take 1 tablet (1 g total) by mouth 2 (two) times daily.  Dispense: 60 tablet; Refill: 12  5. Chronic pain of left knee  - ibuprofen (ADVIL,MOTRIN) 600 MG tablet; Take 1 tablet (600 mg total) by mouth every 8 (eight) hours as needed.  Dispense: 30 tablet; Refill: 0 - Ambulatory referral  to Orthopedic Surgery  6. Abdominal pain in female  - sucralfate (CARAFATE) 1 g tablet; Take 1 tablet (1 g total) by mouth 2 (two) times daily.  Dispense: 60 tablet; Refill: 12  7. Yeast dermatitis Stop potassium  and check labs at next ov.  Discussed at length with patient in 2 family members who have not met before. Discussed with him that all of these issues are chronic problems. She has seen multiple specialists through the year and I will send her back to orthopedics at this time. If the yeast vaginitis is not improved may need wet prep on next exam or referral to GYN. If the skin complaints or not improve will need referral to dermatology.  I have done the exam and reviewed the above chart and it is accurate to the best of my knowledge. Development worker, community has been used in this note in any air is in the dictation or transcription are unintentional.  [facility] 09/08/2016 4:54 PM

## 2016-09-13 ENCOUNTER — Other Ambulatory Visit: Payer: Self-pay

## 2016-09-13 DIAGNOSIS — B373 Candidiasis of vulva and vagina: Secondary | ICD-10-CM

## 2016-09-13 DIAGNOSIS — B3731 Acute candidiasis of vulva and vagina: Secondary | ICD-10-CM

## 2016-09-13 DIAGNOSIS — N898 Other specified noninflammatory disorders of vagina: Secondary | ICD-10-CM

## 2016-09-13 NOTE — Telephone Encounter (Signed)
Patient called saying that the oral pills that was sent in has not helped with her vaginal itching. She is requesting Terazol cream to be sent into [facility] so they can deliver it to her home. Please review. Thanks!

## 2016-09-14 MED ORDER — TERCONAZOLE 0.8 % VA CREA: 1.0000 | TOPICAL_CREAM | Freq: Every day | VAGINAL | 1 refills | Status: DC

## 2016-09-15 IMAGING — CR DG WRIST COMPLETE 3+V*R*
1 series · 4 of 4 positions shown · non-contrast
Comparison: None.

CLINICAL DATA: Right wrist pain and right hand pain radiating down
forearm, patient fell 4 days ago, first digit pain, initial
evaluation

EXAM:
RIGHT WRIST - COMPLETE 3+ VIEW

[Series 1: kdxr wrist rt comp with obliques · 0.14mm/px · 4 of 4 slices shown]
[im 1/4]
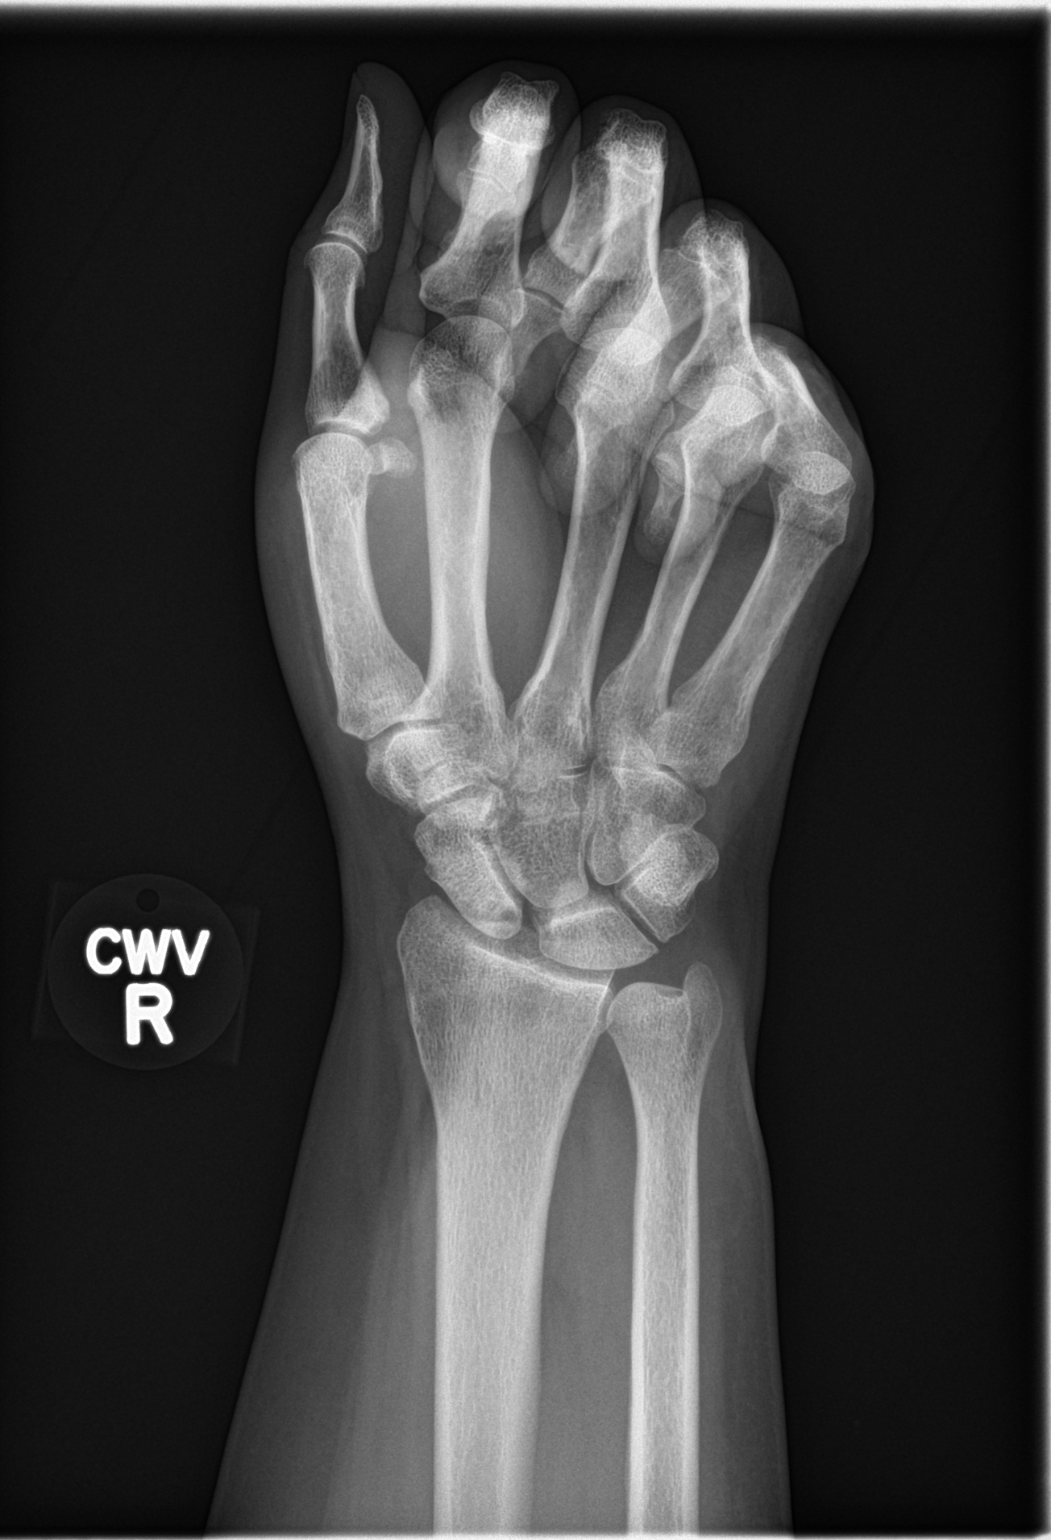
[im 2/4]
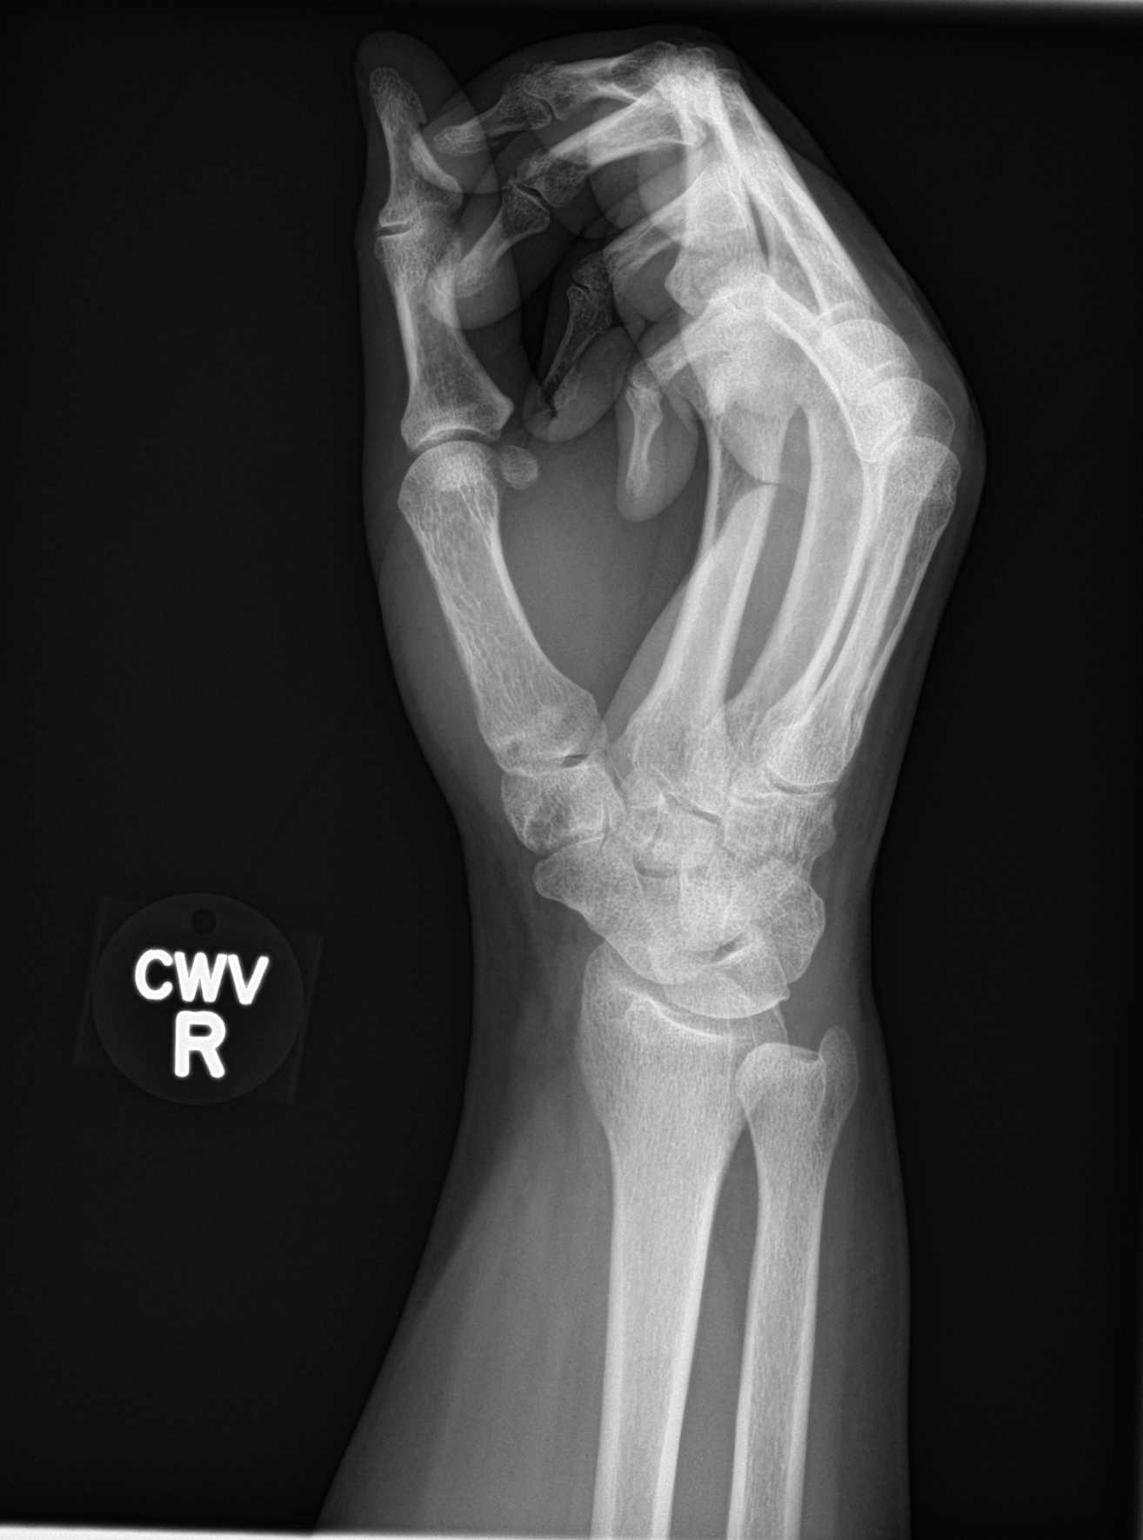
[im 3/4]
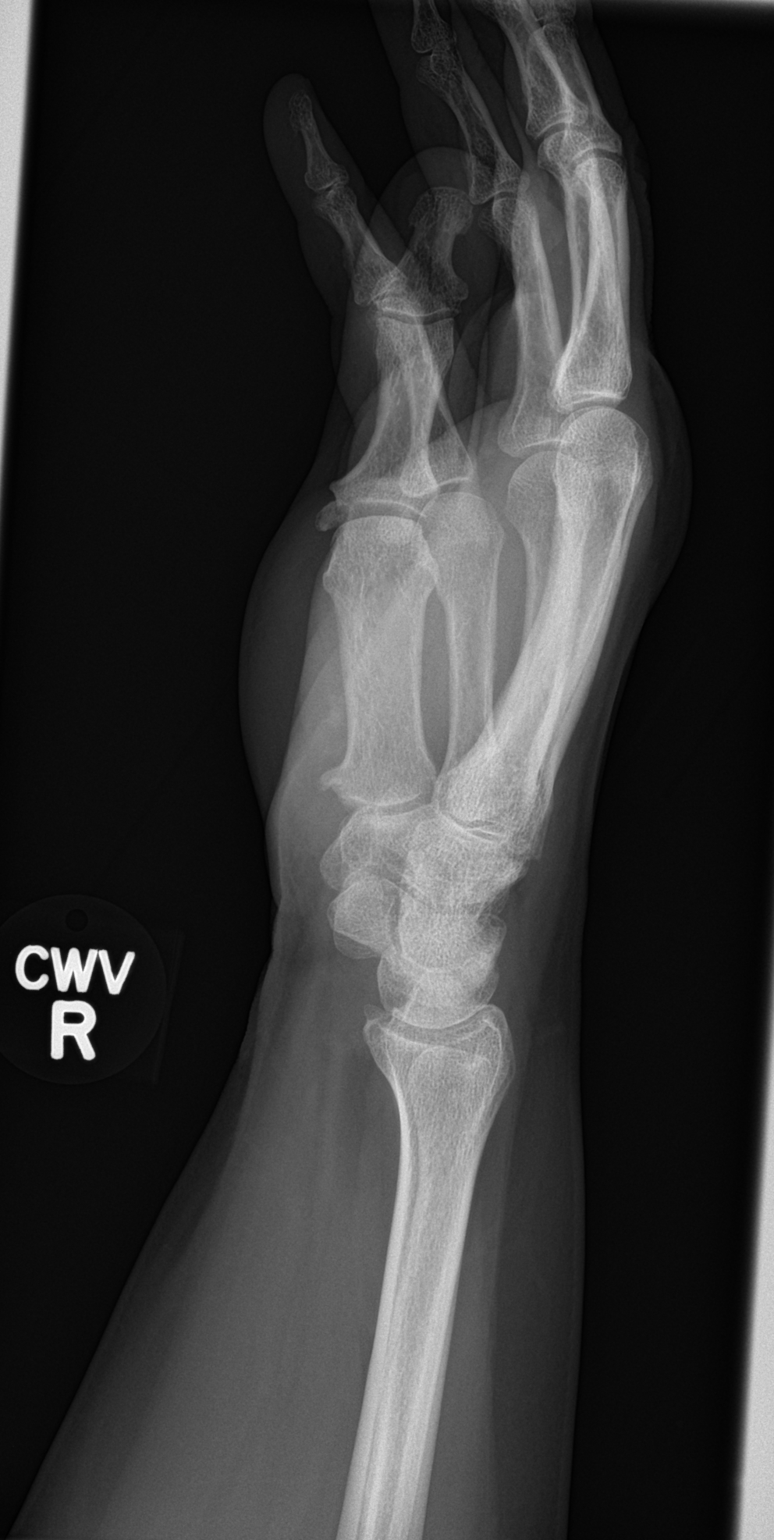
[im 4/4]
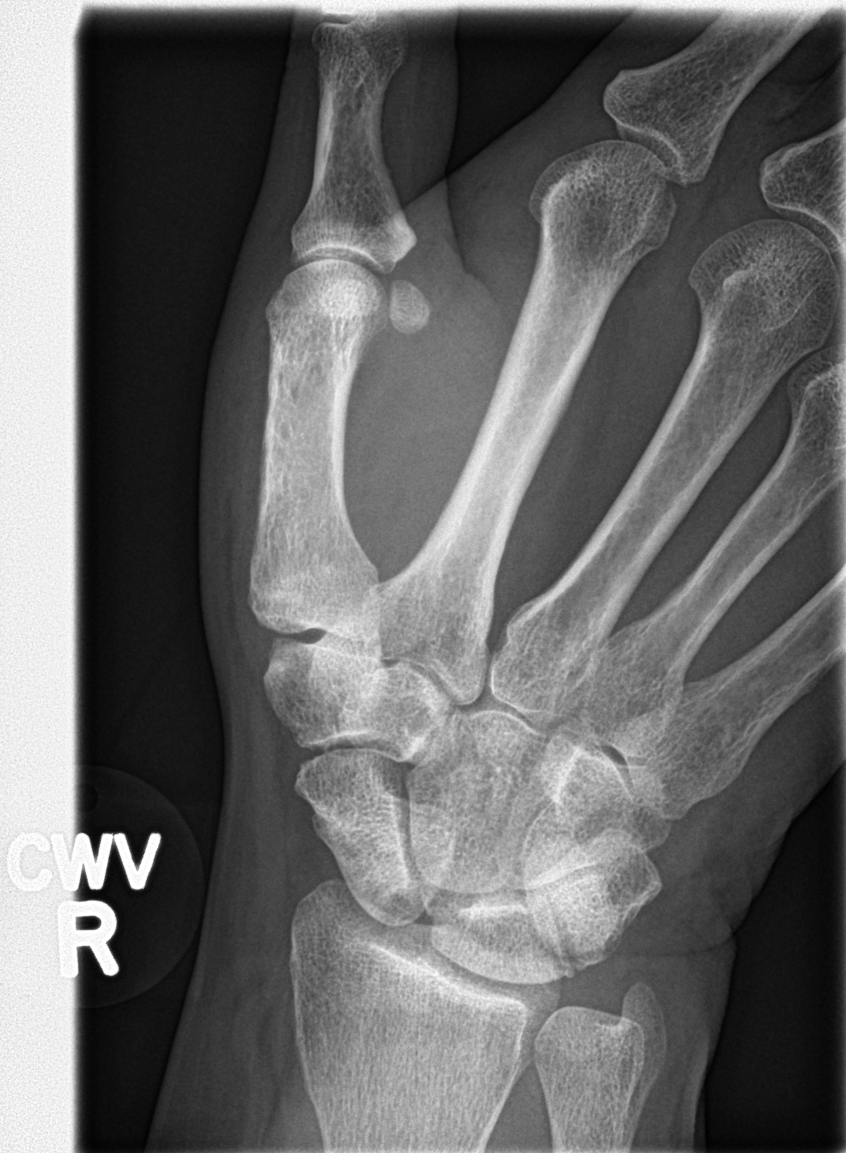

[4 of 4 positions shown; findings below may reference images not displayed]

FINDINGS: Tiny cyst in the proximal pole of the scaphoid likely not of acute
significance. Scapholunate distance appears mildly abnormally
increased which suggests the possibility of scapholunate ligament
tear. On the lateral view, there is a tiny bony projection off of
the medial base of the first metacarpal. This is not directly
related to the carpal metacarpal joint and there is also no other
evidence of carpal metacarpal joint degenerative change.
IMPRESSION: 1. Findings suggest the possibility of scapholunate ligament tear.
2. Tiny projection off of the base of the first metacarpal. No
definite donor site indicate that this represents a fracture. The
possibility of an avulsion fracture is not excluded. Alternatively
this could represent a normal variant.

## 2016-09-15 IMAGING — CR RIGHT HAND - COMPLETE 3+ VIEW
1 series · 3 of 3 positions shown · non-contrast
Comparison: None.

CLINICAL DATA: Right hand and wrist pain, fell 4 days ago

EXAM:
RIGHT HAND - COMPLETE 3+ VIEW

[Series 1: kdxr hand rt complete w/obliques · 0.14mm/px · 3 of 3 slices shown]
[im 1/3]
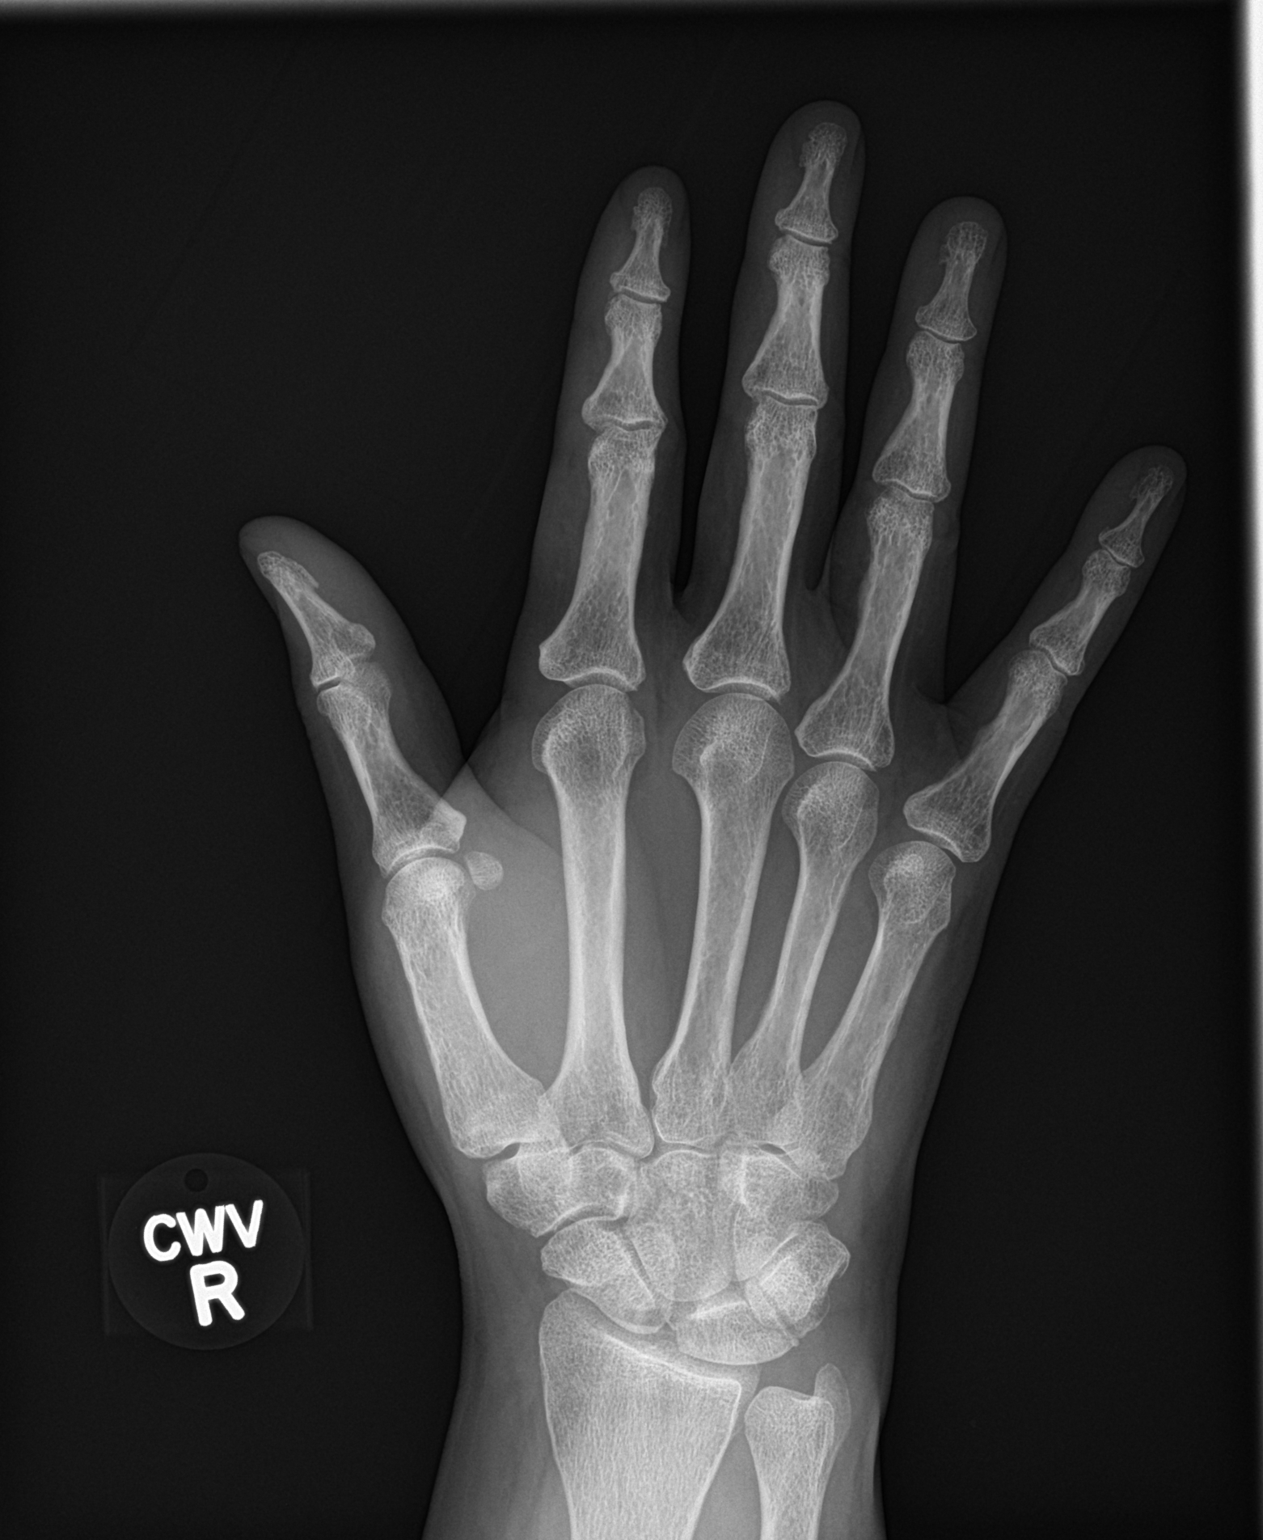
[im 2/3]
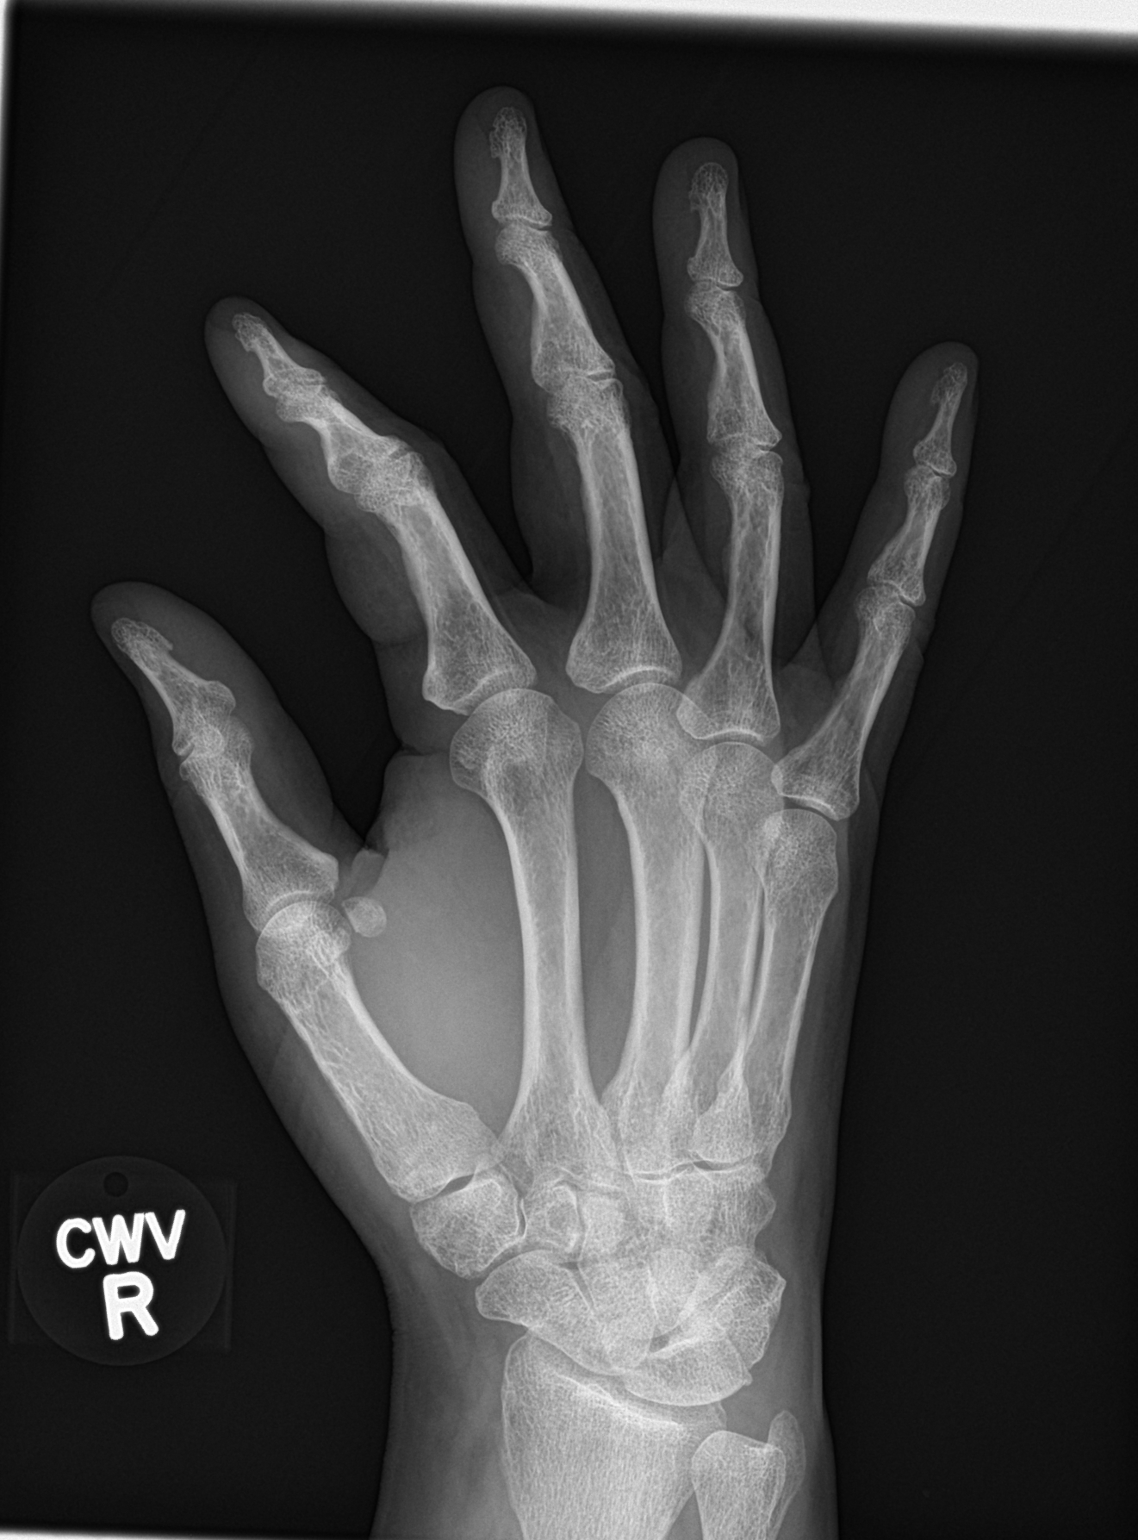
[im 3/3]
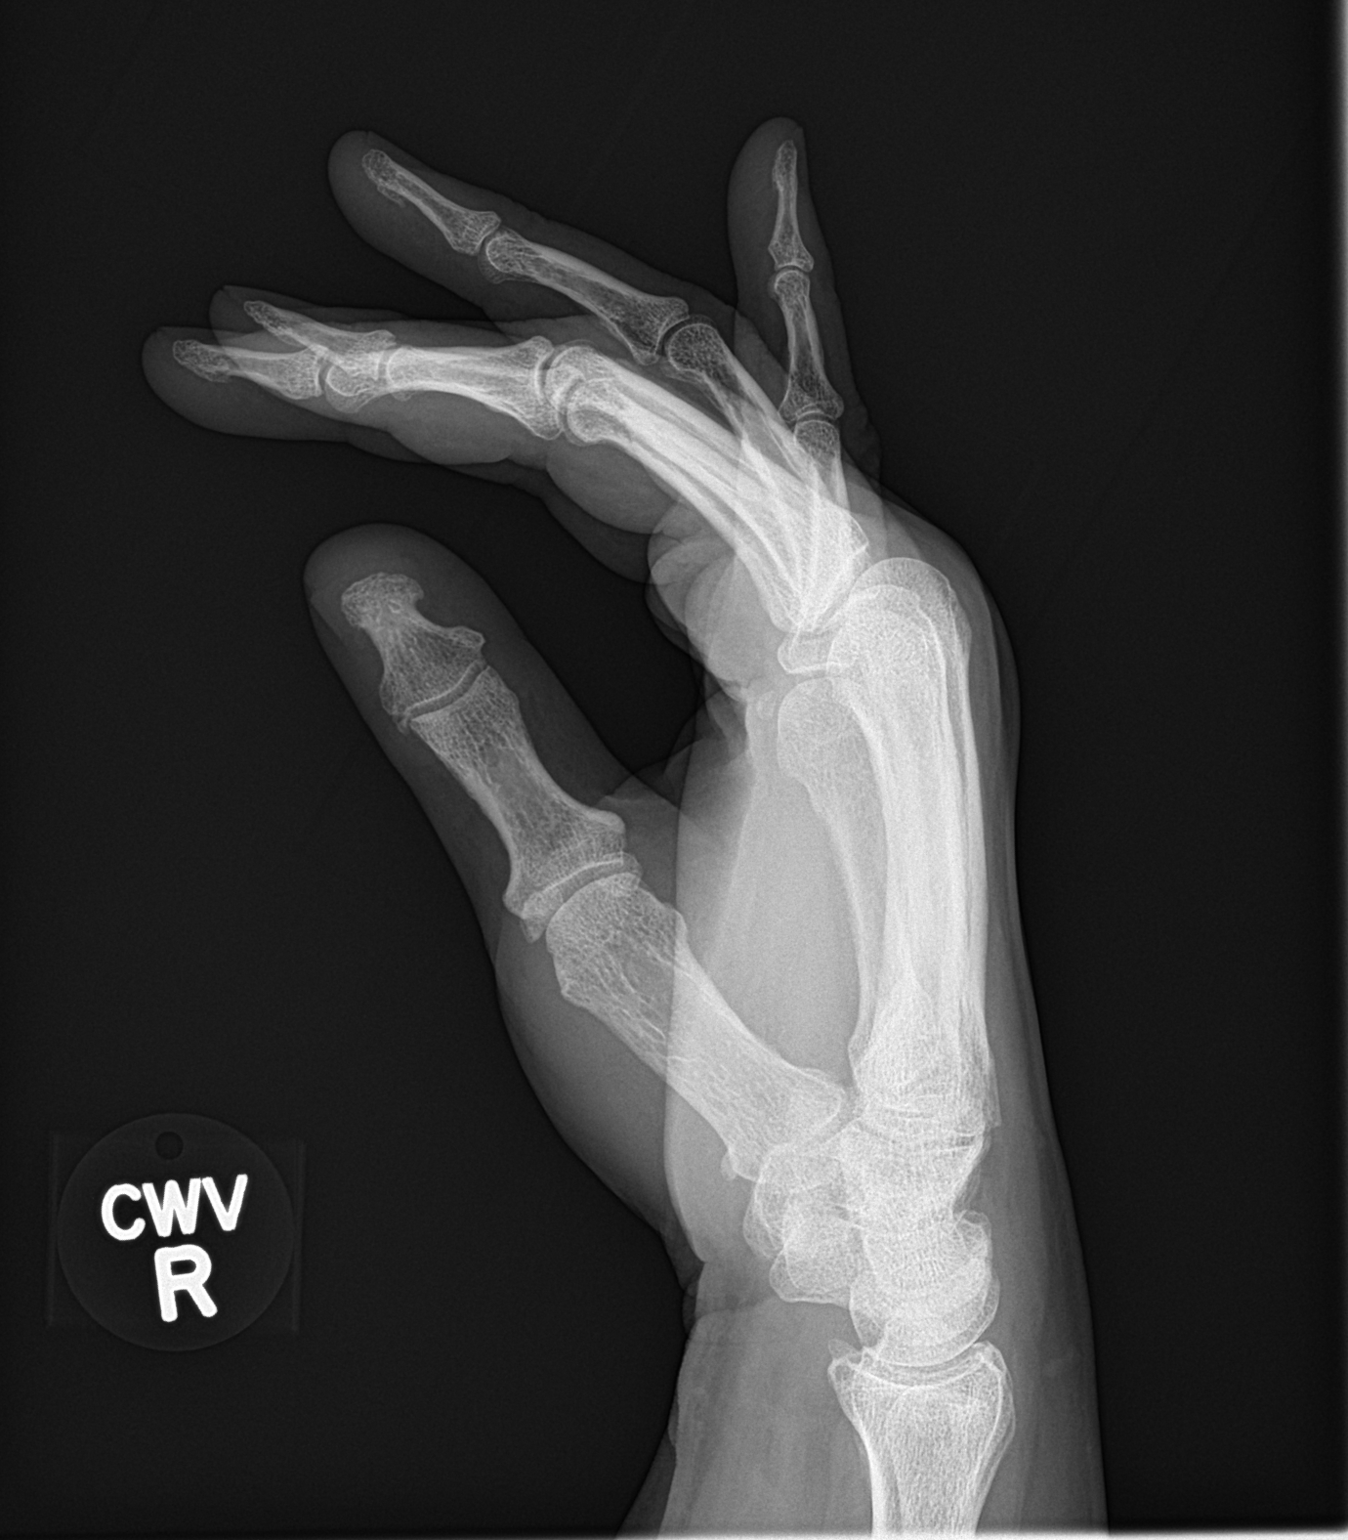

[3 of 3 positions shown; findings below may reference images not displayed]

FINDINGS: No acute fracture is seen. The radiocarpal joint space is
unremarkable and the ulnar styloid is intact. The carpal bones are
in normal position. MCP, PIP, and DIP joints are unremarkable.
IMPRESSION: No fracture.

## 2016-09-21 ENCOUNTER — Telehealth: Payer: Self-pay

## 2016-09-21 NOTE — Telephone Encounter (Signed)
Patient states she feels like she has UTI on top of yeast infection. Symptoms are itching, frequency and urgency to urinate, burning with urination, abdominal pain, feels hot and cold off and on, has not checked her temperature. No back pain. Can we treat patient over the phone or needs an appointment? She is allergic to Cephalexin.-aa

## 2016-09-21 NOTE — Telephone Encounter (Signed)
Pt advised and appointment made for Thursday due to patient does not have a ride today or tomorrow-aa

## 2016-09-21 NOTE — Telephone Encounter (Signed)
Appointment with PA. This is usually not a UTI for her.

## 2016-09-23 ENCOUNTER — Ambulatory Visit (INDEPENDENT_AMBULATORY_CARE_PROVIDER_SITE_OTHER): Payer: Medicaid Other | Admitting: Physician Assistant

## 2016-09-23 ENCOUNTER — Encounter: Payer: Self-pay | Admitting: Physician Assistant

## 2016-09-23 VITALS — BP 136/84 | Temp 97.9°F | Resp 16 | Wt 284.0 lb

## 2016-09-23 DIAGNOSIS — N760 Acute vaginitis: Secondary | ICD-10-CM

## 2016-09-23 DIAGNOSIS — B9689 Other specified bacterial agents as the cause of diseases classified elsewhere: Secondary | ICD-10-CM

## 2016-09-23 DIAGNOSIS — N898 Other specified noninflammatory disorders of vagina: Secondary | ICD-10-CM | POA: Diagnosis not present

## 2016-09-23 DIAGNOSIS — R350 Frequency of micturition: Secondary | ICD-10-CM | POA: Diagnosis not present

## 2016-09-23 LAB — POCT URINALYSIS DIPSTICK
Bilirubin, UA: NEGATIVE
Blood, UA: NEGATIVE
Glucose, UA: NEGATIVE
Ketones, UA: NEGATIVE
Nitrite, UA: NEGATIVE
Protein, UA: NEGATIVE
Spec Grav, UA: 1.01 (ref 1.010–1.025)
Urobilinogen, UA: 0.2 E.U./dL
pH, UA: 6.5 (ref 5.0–8.0)

## 2016-09-23 LAB — POCT WET PREP (WET MOUNT): Trichomonas Wet Prep HPF POC: ABSENT

## 2016-09-23 MED ORDER — METRONIDAZOLE 500 MG PO TABS: 500.0000 mg | ORAL_TABLET | Freq: Two times a day (BID) | ORAL | 0 refills | Status: AC

## 2016-09-23 NOTE — Patient Instructions (Signed)

## 2016-09-23 NOTE — Progress Notes (Signed)
[facility]  Chief Complaint  Patient presents with  . Urinary Tract Infection    Subjective:    Patient ID: Christy [name], female    DOB: May 17, 1955, 61 y.o.   MRN: [id number]   Urinary Tract Infection: Patient complains of abnormal smelling urine, burning with urination, erythema of vaginal area, bilateral flank pain and frequency She has had symptoms for 2 weeks. Patient also complains of vaginal discharge. Patient denies fever. Patient does not have a history of recurrent UTI.  Patient does not have a history of pyelonephritis or other renal issues. Patient reports vaginal discharge and denies new sexual partners. She is not concerned for sexually transmitted disease and has not had sex for a while. The patient denies recent travel outside of the [place].  Review of Systems  Genitourinary: Positive for dysuria, flank pain, frequency and urgency.  Musculoskeletal: Positive for back pain.  Skin: Positive for itching.       Vaginal area.   Neurological: Positive for tremors.       Objective:   BP 136/84 (BP Location: Right Arm, Patient Position: Sitting)   Temp 97.9 F (36.6 C)   Resp 16   Wt 284 lb (128.8 kg)   BMI 41.94 kg/m   Patient Active Problem List   Diagnosis Date Noted  . Controlled type 2 diabetes mellitus without complication, without long-term current use of insulin (HCC) 04/19/2016  . Status post abdominal hysterectomy 05/01/2015  . Morbid obesity with body mass index (BMI) of 45.0 to 49.9 in adult [facility]) 05/01/2015  . Acute respiratory failure (HCC) 04/28/2015  . Allergic rhinitis 08/02/2014  . Arthropathia 08/02/2014  . CCF (congestive cardiac failure) (HCC) 08/02/2014  . Current tobacco use 08/02/2014  . Diabetic neuropathy (HCC) 08/02/2014  . Genital herpes 08/02/2014  . Acid reflux 08/02/2014  . BP (high blood pressure) 08/02/2014  . HLD (hyperlipidemia) 08/02/2014  . Incontinence 08/02/2014  .  Neuropathy 08/02/2014  . Drug noncompliance 08/02/2014  . Adult BMI 30+ 08/02/2014  . Arthritis, degenerative 08/02/2014  . Delusional disorder (HCC) 08/02/2014  . Acute exacerbation of chronic paranoid schizophrenia (HCC) 08/02/2014  . Avitaminosis D 08/02/2014  . Can't get food down 07/16/2013    Outpatient Encounter Prescriptions as of 09/23/2016  Medication Sig  . albuterol (PROVENTIL HFA;VENTOLIN HFA) 108 (90 Base) MCG/ACT inhaler Inhale 1-2 puffs into the lungs every 6 (six) hours as needed for wheezing or shortness of breath.  [name] aspirin EC 81 MG tablet Take 81 mg by mouth daily.  . benztropine (COGENTIN) 0.5 MG tablet Take 0.5 mg by mouth 2 (two) times daily. One tab in the morning and 2 tabs at bedtime  . dicyclomine (BENTYL) 10 MG capsule Take 1 capsule (10 mg total) by mouth 2 (two) times daily.  [name] docusate sodium (COLACE) 100 MG capsule Take 100 mg by mouth 2 (two) times daily.  . fluconazole (DIFLUCAN) 100 MG tablet Take 1 tablet (100 mg total) by mouth daily. For 3 days  . fluticasone (FLONASE) 50 MCG/ACT nasal spray Place 2 sprays into both nostrils daily.  . furosemide (LASIX) 40 MG tablet Take 1 tablet (40 mg total) by mouth daily.  [name] glimepiride (AMARYL) 4 MG tablet Take 1 tablet (4 mg total) by mouth daily with breakfast.  . glucose blood (ACCU-CHEK AVIVA) test strip Check sugar once daily DX E11.9  . ibuprofen (ADVIL,MOTRIN) 600 MG tablet Take 1 tablet (600 mg total) by mouth every 8 (eight) hours as needed.  [name]  Lancet Devices (ACCU-CHEK SOFTCLIX) lancets Check sugar once daily, DX E11.9  . loratadine (CLARITIN) 10 MG tablet Take 1 tablet (10 mg total) by mouth daily.  . metFORMIN (GLUCOPHAGE) 500 MG tablet Take 1 tablet (500 mg total) by mouth 2 (two) times daily with a meal.  . miconazole (MICOTIN) 2 % cream Apply 1 application topically 2 (two) times daily.  . pantoprazole (PROTONIX) 40 MG tablet Take 40 mg by mouth daily.  . pregabalin (LYRICA) 50 MG capsule Take 50  mg by mouth 3 (three) times daily.  . ranitidine (ZANTAC) 150 MG tablet Take 1 tablet (150 mg total) by mouth 2 (two) times daily.  . simvastatin (ZOCOR) 10 MG tablet Take 1 tablet (10 mg total) by mouth daily.  . sucralfate (CARAFATE) 1 g tablet Take 1 tablet (1 g total) by mouth 2 (two) times daily.  [name]. terconazole (TERAZOL 3) 0.8 % vaginal cream Place 1 applicator vaginally at bedtime.  . triamterene-hydrochlorothiazide (MAXZIDE-25) 37.5-25 MG tablet Take 1 tablet by mouth daily.  Marcie Bal. Valbenazine Tosylate (INGREZZA) 80 MG CAPS Take by mouth daily.  . ondansetron (ZOFRAN) 4 MG tablet Take 1 tablet (4 mg total) by mouth every 8 (eight) hours as needed for nausea or vomiting. (Patient not taking: Reported on 09/08/2016)   No facility-administered encounter medications on file as of 09/23/2016.     Allergies  Allergen Reactions  . Cephalexin Itching  . Peroxide [Hydrogen Peroxide] Swelling  . Shellfish Allergy Nausea And Vomiting       Physical Exam  Constitutional: She is oriented to person, place, and time. She appears well-developed and well-nourished.  Abdominal: Soft. Bowel sounds are normal. She exhibits no distension. There is no tenderness. There is no rebound, no guarding and no CVA tenderness.  Genitourinary:  Genitourinary Comments: Did not examine, had patient self collect.  Neurological: She is alert and oriented to person, place, and time.  Skin: Skin is warm and dry.  Psychiatric: She has a normal mood and affect. Her behavior is normal.       Assessment & Plan:  1. Urinary frequency  In office UA positive for only trace leukocytes, suspect this is a dirty catch. Will send for culture to check.   - POCT urinalysis dipstick - CULTURE, URINE COMPREHENSIVE  2. Vaginal odor  Clue cells on Wet mount. Will treat for BV. No hyphae seen. Stop using yeast cream.  - POCT Wet Prep (Wet Mount)  3. Bacterial vaginosis  - metroNIDAZOLE (FLAGYL) 500 MG tablet; Take 1 tablet  (500 mg total) by mouth 2 (two) times daily.  Dispense: 14 tablet; Refill: 0   Return if symptoms worsen or fail to improve.  The entirety of the information documented in the History of Present Illness, Review of Systems and Physical Exam were personally obtained by me. Portions of this information were initially documented by [facility] SurgicenterRochelle, CMA and reviewed by me for thoroughness and accuracy.   I have spent 25 minutes with this patient, >50% of which was spent on counseling and coordination of care.

## 2016-09-27 LAB — CULTURE, URINE COMPREHENSIVE

## 2016-10-07 ENCOUNTER — Telehealth: Payer: Self-pay | Admitting: Family Medicine

## 2016-10-07 NOTE — Telephone Encounter (Signed)
Please review-Christy [name], RMA  

## 2016-10-07 NOTE — Telephone Encounter (Signed)
Pt is having incontinence with her fluid pill.  They want to know if it can be adjusted  Their call back is  [phone]  [facility]

## 2016-10-07 NOTE — Telephone Encounter (Signed)
Please weighed today and stop Lasix. Please weigh weekly and see us within a month.

## 2016-10-11 NOTE — Telephone Encounter (Signed)
Advised patient's caregiver as below.  

## 2016-10-11 NOTE — Telephone Encounter (Signed)
2 in a day--3 in a week.

## 2016-10-11 NOTE — Telephone Encounter (Signed)
Dr. Sullivan Lone, how many pounds should the patient not go over in 1 week? Please advise. Thanks!

## 2016-10-12 ENCOUNTER — Telehealth: Payer: Self-pay | Admitting: Family Medicine

## 2016-10-12 ENCOUNTER — Other Ambulatory Visit: Payer: Self-pay

## 2016-10-12 NOTE — Telephone Encounter (Signed)
Spoke with care giver. She just wanted to get [place] to call the pharmacy and let them know to stop Lasix and take it out of patient's box he gets delivered to her. I advised the pharmacy.Consuella Lose, RMA

## 2016-10-12 NOTE — Telephone Encounter (Signed)
Pt's care giver called back regarding Mrs. Arntson fluid pill.  They got a weight on her and want to know now what they need to do about dosing her fluid pill.  Their call back is   [phone]  Thanks Barth Kirks

## 2016-10-18 ENCOUNTER — Telehealth: Payer: Self-pay

## 2016-10-18 MED ORDER — FUROSEMIDE 40 MG PO TABS: 40.0000 mg | ORAL_TABLET | Freq: Every day | ORAL | 3 refills | Status: DC

## 2016-10-18 NOTE — Telephone Encounter (Signed)
Drew- pharmacist- advised to add Lasix back. The new package will be re set on Thursday.-Oracio Galen V Damara Klunder, RMA Medication updated in the chart as active again but not refilled since pharmacy had 3 refills on file

## 2016-10-18 NOTE — Telephone Encounter (Signed)
[name] patient's caregiver called to let us know that the patient has gained 12lbs since she has stopped the Lasix. She is requesting that we call the pharmacy to have this delivered to the patient so she can start back on the medication tonight. She was taking furosemide  daily. She also needs this put back in patient's box for next week.   Dr. Sullivan Lone was also advised of this and he recommends that patient start back on the fluid pill. Patient uses [facility] for [facility] pharmacy.  Thanks!

## 2016-10-29 ENCOUNTER — Other Ambulatory Visit: Payer: Self-pay | Admitting: Family Medicine

## 2016-10-29 MED ORDER — GLIMEPIRIDE 4 MG PO TABS
4.0000 mg | ORAL_TABLET | Freq: Every day | ORAL | 12 refills | Status: DC
Start: 2016-10-29 — End: 2017-06-07

## 2016-10-29 MED ORDER — RANITIDINE HCL 150 MG PO TABS: 150.0000 mg | ORAL_TABLET | Freq: Two times a day (BID) | ORAL | 12 refills | Status: DC

## 2016-10-29 NOTE — Telephone Encounter (Signed)
[facility] faxed a refill request on the following medications:  ranitidine (ZANTAC) 150 MG tablet.  Take 1 tablet by mouth 2 times daily.    glimepiride (AMARYL) 4 MG tablet.  Take 1 tablet by mouth daily with breakfast.     [facility]

## 2016-11-17 ENCOUNTER — Ambulatory Visit (INDEPENDENT_AMBULATORY_CARE_PROVIDER_SITE_OTHER): Payer: Medicaid Other | Admitting: Family Medicine

## 2016-11-17 ENCOUNTER — Encounter: Payer: Self-pay | Admitting: Family Medicine

## 2016-11-17 VITALS — BP 148/62 | HR 98 | Temp 97.9°F | Resp 18 | Wt 293.0 lb

## 2016-11-17 DIAGNOSIS — M545 Low back pain: Secondary | ICD-10-CM

## 2016-11-17 DIAGNOSIS — E1140 Type 2 diabetes mellitus with diabetic neuropathy, unspecified: Secondary | ICD-10-CM

## 2016-11-17 DIAGNOSIS — G8929 Other chronic pain: Secondary | ICD-10-CM

## 2016-11-17 LAB — POCT GLYCOSYLATED HEMOGLOBIN (HGB A1C): Hemoglobin A1C: 8

## 2016-11-17 MED ORDER — AMANTADINE HCL 100 MG PO TABS: 100.0000 mg | ORAL_TABLET | Freq: Two times a day (BID) | ORAL | Status: DC

## 2016-11-17 MED ORDER — DOCUSATE SODIUM 100 MG PO CAPS: 100.0000 mg | ORAL_CAPSULE | Freq: Every day | ORAL | 0 refills | Status: DC

## 2016-11-17 NOTE — Progress Notes (Signed)
Patient: Christy [name] Female    DOB: [birth year]   61 y.o.   MRN: [id number] Visit Date: 11/17/2016  Today's Provider: Megan Mans, MD   Chief Complaint  Patient presents with  . Follow-up   Subjective:    HPI Pt is her along with her family and her caregiver. They are trying to get her medications in line and get the home health aid on the same page. Pt reports that her vagina area is better. She reports that she has an irritation on her inner thighs. She reports it itches.   2 family members along with [employer] rep are present today and family has a list again of issues ,all of which are chronic.     Allergies  Allergen Reactions  . Cephalexin Itching  . Peroxide [Hydrogen Peroxide] Swelling  . Shellfish Allergy Nausea And Vomiting     Current Outpatient Prescriptions:  .  aspirin EC 81 MG tablet, Take 81 mg by mouth daily., Disp: , Rfl:  .  dicyclomine (BENTYL) 10 MG capsule, Take 1 capsule (10 mg total) by mouth 2 (two) times daily., Disp: 60 capsule, Rfl: 11 .  docusate sodium (COLACE) 100 MG capsule, Take 1 capsule (100 mg total) by mouth daily., Disp: 30 capsule, Rfl: 0 .  furosemide (LASIX) 40 MG tablet, Take 1 tablet (40 mg total) by mouth daily., Disp: 30 tablet, Rfl: 3 .  glimepiride (AMARYL) 4 MG tablet, Take 1 tablet (4 mg total) by mouth daily with breakfast., Disp: 30 tablet, Rfl: 12 .  glucose blood (ACCU-CHEK AVIVA) test strip, Check sugar once daily DX E11.9, Disp: 50 each, Rfl: 12 .  Lancet Devices (ACCU-CHEK SOFTCLIX) lancets, Check sugar once daily, DX E11.9, Disp: 50 each, Rfl: 12 .  loratadine (CLARITIN) 10 MG tablet, Take 1 tablet (10 mg total) by mouth daily., Disp: 30 tablet, Rfl: 12 .  metFORMIN (GLUCOPHAGE) 500 MG tablet, Take 1 tablet (500 mg total) by mouth 2 (two) times daily with a meal., Disp: 180 tablet, Rfl: 3 .  ranitidine (ZANTAC) 150 MG tablet, Take 1 tablet (150 mg total) by mouth 2 (two) times daily., Disp: 60 tablet,  Rfl: 12 .  simvastatin (ZOCOR) 10 MG tablet, Take 1 tablet (10 mg total) by mouth daily., Disp: 30 tablet, Rfl: 12 .  triamterene-hydrochlorothiazide (MAXZIDE-25) 37.5-25 MG tablet, Take 1 tablet by mouth daily., Disp: 30 tablet, Rfl: 12 .  albuterol (PROVENTIL HFA;VENTOLIN HFA) 108 (90 Base) MCG/ACT inhaler, Inhale 1-2 puffs into the lungs every 6 (six) hours as needed for wheezing or shortness of breath., Disp: 3 Inhaler, Rfl: 3 .  Amantadine HCl 100 MG tablet, Take 1 tablet (100 mg total) by mouth 2 (two) times daily., Disp: , Rfl:  .  benztropine (COGENTIN) 0.5 MG tablet, Take 0.5 mg by mouth 2 (two) times daily. One tab in the morning and 2 tabs at bedtime, Disp: , Rfl:  .  fluconazole (DIFLUCAN) 100 MG tablet, Take 1 tablet (100 mg total) by mouth daily. For 3 days (Patient not taking: Reported on 11/17/2016), Disp: 3 tablet, Rfl: 0 .  fluticasone (FLONASE) 50 MCG/ACT nasal spray, Place 2 sprays into both nostrils daily., Disp: 16 g, Rfl: 6 .  ibuprofen (ADVIL,MOTRIN) 600 MG tablet, Take 1 tablet (600 mg total) by mouth every 8 (eight) hours as needed., Disp: 30 tablet, Rfl: 0 .  miconazole (MICOTIN) 2 % cream, Apply 1 application topically 2 (two) times daily. (Patient not taking: Reported  on 11/17/2016), Disp: 28.35 g, Rfl: 0 .  ondansetron (ZOFRAN) 4 MG tablet, Take 1 tablet (4 mg total) by mouth every 8 (eight) hours as needed for nausea or vomiting. (Patient not taking: Reported on 09/08/2016), Disp: 60 tablet, Rfl: 5 .  pantoprazole (PROTONIX) 40 MG tablet, Take 40 mg by mouth daily., Disp: , Rfl:  .  pregabalin (LYRICA) 50 MG capsule, Take 50 mg by mouth 3 (three) times daily., Disp: , Rfl:  .  terconazole (TERAZOL 3) 0.8 % vaginal cream, Place 1 applicator vaginally at bedtime. (Patient not taking: Reported on 11/17/2016), Disp: 20 g, Rfl: 1 .  Valbenazine Tosylate (INGREZZA) 80 MG CAPS, Take by mouth daily., Disp: , Rfl:   Review of Systems  Constitutional: Negative.   HENT: Negative.    Eyes: Negative.   Respiratory: Negative.   Cardiovascular: Negative.   Gastrointestinal: Negative.   Endocrine: Negative.   Genitourinary: Negative.   Musculoskeletal: Positive for back pain.  Skin: Negative.   Allergic/Immunologic: Negative.   Neurological: Negative.   Hematological: Negative.   Psychiatric/Behavioral: Negative.     Social History  Substance Use Topics  . Smoking status: Current Some Day Smoker    Packs/day: 0.00    Types: Cigarettes  . Smokeless tobacco: Never Used     Comment: only smokes sometimes  . Alcohol use Yes     Comment: beer occasionally   Objective:   BP (!) 148/62 (BP Location: Left Arm, Patient Position: Sitting, Cuff Size: Large)   Pulse 98   Temp 97.9 F (36.6 C) (Oral)   Resp 18   Wt 293 lb (132.9 kg)   BMI 43.27 kg/m  Vitals:   11/17/16 1410  BP: (!) 148/62  Pulse: 98  Resp: 18  Temp: 97.9 F (36.6 C)  TempSrc: Oral  Weight: 293 lb (132.9 kg)     Physical Exam  Constitutional: She is oriented to person, place, and time. She appears well-developed and well-nourished.  Large,obese BF NAD.  HENT:  Head: Normocephalic and atraumatic.  Right Ear: External ear normal.  Left Ear: External ear normal.  Nose: Nose normal.  Eyes: Conjunctivae are normal. No scleral icterus.  Neck: Normal range of motion. Neck supple. No thyromegaly present.  Cardiovascular: Normal rate, regular rhythm, normal heart sounds and intact distal pulses.   Pulmonary/Chest: Effort normal and breath sounds normal.  Abdominal: Soft.  Musculoskeletal: Normal range of motion.  Neurological: She is alert and oriented to person, place, and time.  Skin: Skin is warm and dry. No rash noted. No erythema.  Psychiatric: Her behavior is normal.    Diabetic Foot Exam - Simple   Simple Foot Form Diabetic Foot exam was performed with the following findings:  Yes 11/17/2016  2:46 PM  Visual Inspection No deformities, no ulcerations, no other skin breakdown  bilaterally:  Yes Sensation Testing Pulse Check Posterior Tibialis and Dorsalis pulse intact bilaterally:  Yes Comments Decreased sensation distally.          Assessment & Plan:     1. Type 2 diabetes mellitus with diabetic neuropathy, without long-term current use of insulin (HCC)  - POCT HgB A1C 8.0 today. Follow up 3 months. Check blood sugars at least once a day fasting. Check potassium next OV.   2. Chronic low back pain, unspecified back pain laterality, with sciatica presence unspecified More than 50% of 25 minute visit spent in counseling. - DG Lumbar Spine Complete; Future  Stop potassium, and Sucralfate decrease docusate to once a day.  3.OA 4.Schizophrenia 5. Chronic skin irritation Discussed hygiene and moisture issues. 6.Obesity    HPI, Exam, and A&P Transcribed under the direction and in the presence of Krystel Fletchall L. Wendelyn Breslow, MD  Electronically Signed: Silvio Pate, CMA   Ezeriah Luty Wendelyn Breslow, MD  [facility]

## 2016-11-17 NOTE — Patient Instructions (Signed)
Stop potassium and the Sucralfate. Check fasting blood sugar at least once a day.

## 2016-11-26 ENCOUNTER — Telehealth: Payer: Self-pay | Admitting: Family Medicine

## 2016-11-26 NOTE — Telephone Encounter (Signed)
[facility] faxed a request for the following medication. Thanks CC  simvastatin (ZOCOR) 10 MG tablet

## 2016-12-13 ENCOUNTER — Other Ambulatory Visit: Payer: Self-pay | Admitting: Family Medicine

## 2016-12-13 NOTE — Telephone Encounter (Signed)
[facility] fax[facility] refill request for following medications: simvastatin (ZOCOR) 10 MG tablet     Please advise,Thanks 

## 2016-12-13 NOTE — Telephone Encounter (Signed)
Please review. Thanks!  

## 2016-12-14 MED ORDER — SIMVASTATIN 10 MG PO TABS: 10.0000 mg | ORAL_TABLET | Freq: Every day | ORAL | 12 refills | Status: DC

## 2016-12-21 ENCOUNTER — Other Ambulatory Visit: Payer: Self-pay | Admitting: Family Medicine

## 2016-12-21 MED ORDER — GLUCOSE BLOOD VI STRP: ORAL_STRIP | 12 refills | Status: AC

## 2016-12-21 NOTE — Telephone Encounter (Signed)
[facility] faxed a request for the following prescription. Thanks CC  glucose blood (ACCU-CHEK AVIVA) test strip

## 2016-12-22 ENCOUNTER — Telehealth: Payer: Self-pay | Admitting: Family Medicine

## 2016-12-22 NOTE — Telephone Encounter (Signed)
Pt did not complete x ray of spine that was ordered Oct 2018

## 2017-01-10 ENCOUNTER — Other Ambulatory Visit: Payer: Self-pay

## 2017-01-10 MED ORDER — ACCU-CHEK SOFTCLIX LANCETS MISC: 12 refills | Status: AC

## 2017-01-27 ENCOUNTER — Ambulatory Visit (INDEPENDENT_AMBULATORY_CARE_PROVIDER_SITE_OTHER): Payer: Medicaid Other | Admitting: Family Medicine

## 2017-01-27 ENCOUNTER — Encounter: Payer: Self-pay | Admitting: Family Medicine

## 2017-01-27 VITALS — BP 128/84 | HR 90 | Temp 98.7°F | Resp 16 | Wt 308.6 lb

## 2017-01-27 DIAGNOSIS — R600 Localized edema: Secondary | ICD-10-CM

## 2017-01-27 MED ORDER — FUROSEMIDE 40 MG PO TABS: 40.0000 mg | ORAL_TABLET | Freq: Two times a day (BID) | ORAL | 0 refills | Status: DC

## 2017-01-27 NOTE — Patient Instructions (Signed)
We will call you with the lab results. Take your furosemide twice daily.

## 2017-01-27 NOTE — Progress Notes (Signed)
Subjective:     Patient ID: Christy [name], female   DOB: [birth year], 62 y.o.   MRN: [id number] Chief Complaint  Patient presents with  . Leg Swelling    Patient comes in office today with reports of swelling in both of her legs for the past two weeks. Nurse who is with patient today states that patient has been eating more processed food and has not been elevating her legs, there is no reported changed to medication list.    HPI States legs are painful especially when touched. Denies shortness of breath and states she has been compliant with both diuretics. She is accompanied by Elmarie Shileyiffany a nurse from [facility].  Review of Systems     Objective:   Physical Exam  Constitutional: She appears well-developed and well-nourished. No distress.  Cardiovascular: Normal rate and regular rhythm.  Pulmonary/Chest: Breath sounds normal.  Musculoskeletal: She exhibits edema (1+ lower extremties and painful to the touch.).       Assessment:    1. Bilateral leg edema: will increase furosemide pending lab work. - Renal Function Panel - furosemide (LASIX) 40 MG tablet; Take 1 tablet (40 mg total) by mouth 2 (two) times daily.  Dispense: 60 tablet; Refill: 0    Plan:    Further f/u pending lab results. Consider adding KCL if prolonged use and/or pressure stockings

## 2017-01-28 ENCOUNTER — Other Ambulatory Visit: Payer: Self-pay | Admitting: Family Medicine

## 2017-01-28 LAB — RENAL FUNCTION PANEL
Albumin: 4.3 g/dL (ref 3.6–4.8)
BUN/Creatinine Ratio: 10 — ABNORMAL LOW (ref 12–28)
BUN: 12 mg/dL (ref 8–27)
CO2: 25 mmol/L (ref 20–29)
CREATININE: 1.15 mg/dL — AB (ref 0.57–1.00)
Calcium: 10 mg/dL (ref 8.7–10.3)
Chloride: 100 mmol/L (ref 96–106)
GFR calc Af Amer: 59 mL/min/{1.73_m2} — ABNORMAL LOW (ref >59)
GFR, EST NON AFRICAN AMERICAN: 51 mL/min/{1.73_m2} — AB (ref >59)
Glucose: 231 mg/dL — ABNORMAL HIGH (ref 65–99)
PHOSPHORUS: 3.2 mg/dL (ref 2.5–4.5)
Potassium: 3.7 mmol/L (ref 3.5–5.2)
SODIUM: 143 mmol/L (ref 134–144)

## 2017-01-28 MED ORDER — POTASSIUM CHLORIDE CRYS ER 20 MEQ PO TBCR: 20.0000 meq | EXTENDED_RELEASE_TABLET | Freq: Every day | ORAL | 1 refills | Status: DC

## 2017-02-10 ENCOUNTER — Telehealth: Payer: Self-pay | Admitting: Family Medicine

## 2017-02-10 NOTE — Telephone Encounter (Signed)
Judeth CornfieldStephanie from [facility] called stating that since pt is receiving services pt or family member would need to contact [facility] (3rd party that manages elgibility for Medicaid pt's) to let them know that pt wishes to get different company for services. Judeth CornfieldStephanie spoke with pt and her cousin Zenon MayoSheena.Tiffany from [facility] was advised of this and given number for [facility] [phone]

## 2017-02-10 NOTE — Telephone Encounter (Signed)
Tiffany form [facility] called stating that [employer] [facility] have provided services for pt such as help with bathing,light cooking and meds.Pt was verbally abused by aid and they would like to get a different company to help with these services.I spoke with Shirlee Limerickhristy Ray from [facility] who states she will check into this and call me back.Tiffany's cell # is [phone] [facility] [phone]

## 2017-03-23 ENCOUNTER — Ambulatory Visit: Payer: Self-pay | Admitting: Family Medicine

## 2017-03-24 ENCOUNTER — Ambulatory Visit: Payer: Self-pay | Admitting: Family Medicine

## 2017-03-30 ENCOUNTER — Ambulatory Visit: Payer: Self-pay | Admitting: Family Medicine

## 2017-04-04 ENCOUNTER — Ambulatory Visit: Payer: Self-pay | Admitting: Family Medicine

## 2017-04-04 NOTE — Progress Notes (Deleted)
Patient: Christy [name] Female    DOB: 13-Mar-1955   62 y.o.   MRN: [id number] Visit Date: 04/04/2017  Today's Provider: Megan Mansichard Gilbert Jr, MD   No chief complaint on file.  Subjective:    HPI     Allergies  Allergen Reactions  . Cephalexin Itching  . Peroxide [Hydrogen Peroxide] Swelling  . Shellfish Allergy Nausea And Vomiting     Current Outpatient Medications:  .  ACCU-CHEK SOFTCLIX LANCETS lancets, Check sugar once daily  DX E11.9, Disp: 100 each, Rfl: 12 .  albuterol (PROVENTIL HFA;VENTOLIN HFA) 108 (90 Base) MCG/ACT inhaler, Inhale 1-2 puffs into the lungs every 6 (six) hours as needed for wheezing or shortness of breath., Disp: 3 Inhaler, Rfl: 3 .  Amantadine HCl 100 MG tablet, Take 1 tablet (100 mg total) by mouth 2 (two) times daily., Disp: , Rfl:  .  aspirin EC 81 MG tablet, Take 81 mg by mouth daily., Disp: , Rfl:  .  benztropine (COGENTIN) 0.5 MG tablet, Take 0.5 mg by mouth 2 (two) times daily. One tab in the morning and 2 tabs at bedtime, Disp: , Rfl:  .  dicyclomine (BENTYL) 10 MG capsule, Take 1 capsule (10 mg total) by mouth 2 (two) times daily., Disp: 60 capsule, Rfl: 11 .  docusate sodium (COLACE) 100 MG capsule, Take 1 capsule (100 mg total) by mouth daily., Disp: 30 capsule, Rfl: 0 .  fluconazole (DIFLUCAN) 100 MG tablet, Take 1 tablet (100 mg total) by mouth daily. For 3 days (Patient not taking: Reported on 01/27/2017), Disp: 3 tablet, Rfl: 0 .  fluticasone (FLONASE) 50 MCG/ACT nasal spray, Place 2 sprays into both nostrils daily., Disp: 16 g, Rfl: 6 .  furosemide (LASIX) 40 MG tablet, Take 1 tablet (40 mg total) by mouth 2 (two) times daily., Disp: 60 tablet, Rfl: 0 .  glimepiride (AMARYL) 4 MG tablet, Take 1 tablet (4 mg total) by mouth daily with breakfast., Disp: 30 tablet, Rfl: 12 .  glucose blood (ACCU-CHEK AVIVA) test strip, Check sugar once daily DX E11.9, Disp: 50 each, Rfl: 12 .  ibuprofen (ADVIL,MOTRIN) 600 MG tablet, Take 1 tablet (600 mg  total) by mouth every 8 (eight) hours as needed., Disp: 30 tablet, Rfl: 0 .  Lancet Devices (ACCU-CHEK SOFTCLIX) lancets, Check sugar once daily, DX E11.9, Disp: 50 each, Rfl: 12 .  loratadine (CLARITIN) 10 MG tablet, Take 1 tablet (10 mg total) by mouth daily., Disp: 30 tablet, Rfl: 12 .  metFORMIN (GLUCOPHAGE) 500 MG tablet, Take 1 tablet (500 mg total) by mouth 2 (two) times daily with a meal., Disp: 180 tablet, Rfl: 3 .  miconazole (MICOTIN) 2 % cream, Apply 1 application topically 2 (two) times daily., Disp: 28.35 g, Rfl: 0 .  ondansetron (ZOFRAN) 4 MG tablet, Take 1 tablet (4 mg total) by mouth every 8 (eight) hours as needed for nausea or vomiting., Disp: 60 tablet, Rfl: 5 .  pantoprazole (PROTONIX) 40 MG tablet, Take 40 mg by mouth daily., Disp: , Rfl:  .  potassium chloride SA (K-DUR,KLOR-CON) 20 MEQ tablet, Take 1 tablet (20 mEq total) by mouth daily., Disp: 90 tablet, Rfl: 1 .  pregabalin (LYRICA) 50 MG capsule, Take 50 mg by mouth 3 (three) times daily., Disp: , Rfl:  .  ranitidine (ZANTAC) 150 MG tablet, Take 1 tablet (150 mg total) by mouth 2 (two) times daily., Disp: 60 tablet, Rfl: 12 .  simvastatin (ZOCOR) 10 MG tablet, Take 1 tablet (  10 mg total) by mouth daily., Disp: 30 tablet, Rfl: 12 .  terconazole (TERAZOL 3) 0.8 % vaginal cream, Place 1 applicator vaginally at bedtime. (Patient not taking: Reported on 01/27/2017), Disp: 20 g, Rfl: 1 .  triamterene-hydrochlorothiazide (MAXZIDE-25) 37.5-25 MG tablet, Take 1 tablet by mouth daily., Disp: 30 tablet, Rfl: 12 .  Valbenazine Tosylate (INGREZZA) 80 MG CAPS, Take by mouth daily., Disp: , Rfl:   Review of Systems  Social History   Tobacco Use  . Smoking status: Current Some Day Smoker    Packs/day: 0.00    Types: Cigarettes  . Smokeless tobacco: Never Used  . Tobacco comment: only smokes sometimes  Substance Use Topics  . Alcohol use: Yes    Comment: beer occasionally   Objective:   There were no vitals taken for this  visit.   Physical Exam      Assessment & Plan:           Megan Mans, MD  [facility]

## 2017-04-13 ENCOUNTER — Ambulatory Visit: Payer: Medicaid Other | Admitting: Family Medicine

## 2017-04-20 ENCOUNTER — Ambulatory Visit: Payer: Medicaid Other | Admitting: Family Medicine

## 2017-04-25 ENCOUNTER — Telehealth: Payer: Self-pay | Admitting: Family Medicine

## 2017-04-25 NOTE — Telephone Encounter (Signed)
Diona FoleyAdvised Tameka at [facility] that we need to see [facility]

## 2017-04-25 NOTE — Telephone Encounter (Signed)
Christy [name] with Christy [name] called saying Christy [name] is not taking her morning glucose.  She is supposed to have an aid everyday.  She said someone needs to make sure Christy [name] is getting her glucose taken regularly and her weight checked reguarly.       She would like an order stating this on her care plan and faxed to them  Their fax number is [phone]  Thanks teri

## 2017-04-25 NOTE — Telephone Encounter (Signed)
Appt needed--they have missed or no showed last 5 appts.!

## 2017-04-25 NOTE — Telephone Encounter (Signed)
Please advise. Thanks.  

## 2017-04-26 ENCOUNTER — Telehealth: Payer: Self-pay | Admitting: Family Medicine

## 2017-04-26 NOTE — Telephone Encounter (Signed)
Christy [name] from [facility] called stating that lasix 40MG  has been prescribed for pt to take twice a day.She wonders if this can be changed to 80 MG and only once a day in the AM.When taking in the afternoon pt has to get up during the night to go to the bathroom

## 2017-04-27 ENCOUNTER — Telehealth: Payer: Self-pay

## 2017-04-27 NOTE — Telephone Encounter (Signed)
Pharmacy informed.

## 2017-04-27 NOTE — Telephone Encounter (Signed)
ok 

## 2017-04-27 NOTE — Telephone Encounter (Signed)
Please advise. Thanks.  

## 2017-04-27 NOTE — Telephone Encounter (Signed)
Not without appt.

## 2017-04-27 NOTE — Telephone Encounter (Signed)
Patient calls office today requesting a prescription to treat for yeast infection. Patient reports that for the past 3 days she has had vaginal itching (internal and external. Patient request prescription for cream and pill be [facility] to [facility]. KW

## 2017-04-28 NOTE — Telephone Encounter (Signed)
Patient called to check status of RX. Patient advised she would need to schedule a OV. She states she will get her nurse to call back to schedule appointment.

## 2017-04-29 ENCOUNTER — Ambulatory Visit: Payer: Medicaid Other | Admitting: Family Medicine

## 2017-04-29 ENCOUNTER — Encounter: Payer: Self-pay | Admitting: Family Medicine

## 2017-04-29 ENCOUNTER — Ambulatory Visit (INDEPENDENT_AMBULATORY_CARE_PROVIDER_SITE_OTHER): Payer: Medicaid Other | Admitting: Family Medicine

## 2017-04-29 VITALS — BP 112/76 | HR 102 | Temp 97.6°F | Resp 17 | Wt 289.8 lb

## 2017-04-29 DIAGNOSIS — N898 Other specified noninflammatory disorders of vagina: Secondary | ICD-10-CM

## 2017-04-29 DIAGNOSIS — Z113 Encounter for screening for infections with a predominantly sexual mode of transmission: Secondary | ICD-10-CM | POA: Diagnosis not present

## 2017-04-29 DIAGNOSIS — A6000 Herpesviral infection of urogenital system, unspecified: Secondary | ICD-10-CM | POA: Diagnosis not present

## 2017-04-29 MED ORDER — MICONAZOLE NITRATE 2 % EX CREA: 1.0000 "application " | TOPICAL_CREAM | Freq: Two times a day (BID) | CUTANEOUS | 0 refills | Status: DC

## 2017-04-29 MED ORDER — VALACYCLOVIR HCL 1 G PO TABS: 1000.0000 mg | ORAL_TABLET | Freq: Every day | ORAL | 3 refills | Status: DC

## 2017-04-29 MED ORDER — FLUCONAZOLE 150 MG PO TABS: 150.0000 mg | ORAL_TABLET | Freq: Once | ORAL | 1 refills | Status: AC

## 2017-04-29 NOTE — Patient Instructions (Signed)
We will call you with the lab results. 

## 2017-04-29 NOTE — Progress Notes (Signed)
Subjective:     Patient ID: Christy [name], female   DOB: Nov 30, 1955, 62 y.o.   MRN: [id number] Chief Complaint  Patient presents with  . Vaginitis    Patient comes in office today with concerns of a possible yeast infection, patient reports external and internal vaginal itching for the past 3-4 days.Patient denies vaginal discharge but states that she has swelling around labia and pain with urination and wiping.   . Leg Swelling    Patient reports swelling in both her legs that had left her in bed for 3 days.    HPI Accompanied by [name] from [facility].[name] reports that she has one partner who uses condoms. Caseworker states she has multiple partners. Hx of genital HSV but not a good historian about last recurrence. Previously has been on Valtrex and treated with fluconazole orally and miconazole topically.  Review of Systems     Objective:   Physical Exam  Constitutional: She appears well-developed and well-nourished. No distress.  Genitourinary:  Genitourinary Comments: External exam without rash,vesicles, or discharge.       Assessment:    1. Vaginal itching - miconazole (MICOTIN) 2 % cream; Apply 1 application topically 2 (two) times daily.  Dispense: 28.35 g; Refill: 0 Diflucan 150 mg. unidose with refill  2. Screen for STD (sexually transmitted disease) - Chlamydia/Gonococcus/Trichomonas, NAA  3. Genital herpes simplex, unspecified site - valACYclovir (VALTREX) 1000 MG tablet; Take 1 tablet (1,000 mg total) by mouth daily.  Dispense: 90 tablet; Refill: 3    Plan:    Further f/u pending lab results.

## 2017-05-01 LAB — CHLAMYDIA/GONOCOCCUS/TRICHOMONAS, NAA
CHLAMYDIA BY NAA: NEGATIVE
Gonococcus by NAA: NEGATIVE
TRICH VAG BY NAA: NEGATIVE

## 2017-05-02 ENCOUNTER — Telehealth: Payer: Self-pay

## 2017-05-02 NOTE — Telephone Encounter (Signed)
Patient has been advised, she states that she still has some itching but has been using cream as prescribed and is improving. KW

## 2017-05-02 NOTE — Telephone Encounter (Signed)
-----   Message from Anola Gurneyobert Chauvin, [place] sent at 05/02/2017  7:29 AM EDT ----- Urine STD screen negative

## 2017-06-07 ENCOUNTER — Encounter: Payer: Self-pay | Admitting: Family Medicine

## 2017-06-07 ENCOUNTER — Ambulatory Visit (INDEPENDENT_AMBULATORY_CARE_PROVIDER_SITE_OTHER): Payer: Medicaid Other | Admitting: Family Medicine

## 2017-06-07 VITALS — BP 122/62 | HR 108 | Temp 98.4°F | Resp 16 | Wt 289.0 lb

## 2017-06-07 DIAGNOSIS — E1140 Type 2 diabetes mellitus with diabetic neuropathy, unspecified: Secondary | ICD-10-CM | POA: Diagnosis not present

## 2017-06-07 DIAGNOSIS — M170 Bilateral primary osteoarthritis of knee: Secondary | ICD-10-CM | POA: Diagnosis not present

## 2017-06-07 DIAGNOSIS — N183 Chronic kidney disease, stage 3 unspecified: Secondary | ICD-10-CM | POA: Insufficient documentation

## 2017-06-07 LAB — POCT GLYCOSYLATED HEMOGLOBIN (HGB A1C): Hemoglobin A1C: 11.5

## 2017-06-07 MED ORDER — METFORMIN HCL 1000 MG PO TABS: 1000.0000 mg | ORAL_TABLET | Freq: Two times a day (BID) | ORAL | 3 refills | Status: DC

## 2017-06-07 MED ORDER — ACETAMINOPHEN 500 MG PO TABS: ORAL_TABLET | ORAL | 1 refills | Status: DC

## 2017-06-07 MED ORDER — GLIMEPIRIDE 4 MG PO TABS: ORAL_TABLET | ORAL | 1 refills | Status: DC

## 2017-06-07 NOTE — Progress Notes (Signed)
  Subjective:     Patient ID: STORMY CONNON, female   DOB: 1955-06-29, 62 y.o.   MRN: [id number] Chief Complaint  Patient presents with  . Diabetes    Pt caregiver reports that her blood sugars have been runniing in the mid to high 200's. Pt has some compliance issues that are on going. Caregiver feels that if she can get her knees feeling better she can get pt more active and blood sugar down some.   . Knee Pain    Knee pain has been ongoing as well. She has been to see ortho and given cortisone injections helped for a little while but pain came back and the injectins made knees weak. Pain is located in bilateral knees.    HPI Jatavia takes her pills in a blister pack so compliance is felt to be good with diabetes medication. Weight is a few pounds less. Fasting sugars have all been in the 200's. Has hx of bilateral knee arthritis and has received injection per Dr. Deeann Saint in the left knee 8/23/ 2018. She is accompanied by her [facility] Nurse, Geoffery Spruce [phone]).      Objective:   Physical Exam  Constitutional: She appears well-developed and well-nourished. No distress.  Musculoskeletal:  Bilateral KF/KE/PF/DF: 5/5. Moderate tenderness over left knee without increased warmth.       Assessment:    .1. Type 2 diabetes mellitus with diabetic neuropathy, without long-term current use of insulin (HCC): will increase medication for improved control - POCT HgB A1C - metFORMIN (GLUCOPHAGE) 1000 MG tablet; Take 1 tablet (1,000 mg total) by mouth 2 (two) times daily with a meal.  Dispense: 180 tablet; Refill: 3 - glimepiride (AMARYL) 4 MG tablet; Two tablets daily with breakfast  Dispense: 180 tablet; Refill: 1  2. Osteoarthritis of both knees, unspecified osteoarthritis type - Ambulatory referral to Orthopedic Surgery    Plan:    Have sent in Tylenol for knee pain pending orthopedic evaluation. Discouraged use of nsaid's due to CKD

## 2017-06-07 NOTE — Patient Instructions (Addendum)
Avoid antiinflammatories like ibuprofen and Aleve. Do follow up with Dr.Gilbert in 3 months.

## 2017-06-28 ENCOUNTER — Other Ambulatory Visit: Payer: Self-pay | Admitting: Family Medicine

## 2017-06-28 MED ORDER — POTASSIUM CHLORIDE CRYS ER 20 MEQ PO TBCR: 20.0000 meq | EXTENDED_RELEASE_TABLET | Freq: Every day | ORAL | 1 refills | Status: DC

## 2017-06-28 NOTE — Telephone Encounter (Signed)
[facility] faxed a refill request for the following medication. Thanks CC  potassium chloride SA (K-DUR,KLOR-CON) 20 MEQ tablet

## 2017-07-05 ENCOUNTER — Other Ambulatory Visit: Payer: Self-pay | Admitting: Family Medicine

## 2017-07-05 DIAGNOSIS — R600 Localized edema: Secondary | ICD-10-CM

## 2017-07-05 NOTE — Telephone Encounter (Signed)
[facility] faxed a refill request for the following medication. Thanks CC ° °furosemide (LASIX) 40 MG tablet  ° °

## 2017-07-05 NOTE — Telephone Encounter (Signed)
Last filled 01/27/17. KW

## 2017-07-06 NOTE — Telephone Encounter (Signed)
See refill request.

## 2017-07-07 MED ORDER — FUROSEMIDE 40 MG PO TABS: 40.0000 mg | ORAL_TABLET | Freq: Two times a day (BID) | ORAL | 6 refills | Status: DC

## 2017-07-07 NOTE — Telephone Encounter (Signed)
6 month refill  

## 2017-07-07 NOTE — Telephone Encounter (Signed)
Sent in refills into the pharmacy.

## 2017-07-25 ENCOUNTER — Other Ambulatory Visit: Payer: Self-pay | Admitting: Family Medicine

## 2017-07-25 DIAGNOSIS — R600 Localized edema: Secondary | ICD-10-CM

## 2017-07-25 MED ORDER — TRIAMTERENE-HCTZ 37.5-25 MG PO TABS: 1.0000 | ORAL_TABLET | Freq: Every day | ORAL | 12 refills | Status: DC

## 2017-07-25 MED ORDER — FUROSEMIDE 40 MG PO TABS: 40.0000 mg | ORAL_TABLET | Freq: Two times a day (BID) | ORAL | 6 refills | Status: DC

## 2017-07-25 NOTE — Telephone Encounter (Addendum)
[facility] faxed a refill request for the following medications. Thanks CC   triamterene-hydrochlorothiazide (MAXZIDE-25) 37.5-25 MG tablet   furosemide (LASIX) 40 MG tablet

## 2017-08-08 ENCOUNTER — Other Ambulatory Visit: Payer: Self-pay

## 2017-08-08 MED ORDER — LORATADINE 10 MG PO TABS: 10.0000 mg | ORAL_TABLET | Freq: Every day | ORAL | 12 refills | Status: DC

## 2017-08-08 NOTE — Telephone Encounter (Signed)
Pharmacy faxed over request. 

## 2017-09-09 ENCOUNTER — Other Ambulatory Visit: Payer: Self-pay | Admitting: Family Medicine

## 2017-09-09 ENCOUNTER — Telehealth: Payer: Self-pay | Admitting: Family Medicine

## 2017-09-09 DIAGNOSIS — R1084 Generalized abdominal pain: Secondary | ICD-10-CM

## 2017-09-09 DIAGNOSIS — R600 Localized edema: Secondary | ICD-10-CM

## 2017-09-09 MED ORDER — DICYCLOMINE HCL 10 MG PO CAPS: 10.0000 mg | ORAL_CAPSULE | Freq: Two times a day (BID) | ORAL | 11 refills | Status: DC

## 2017-09-09 MED ORDER — FUROSEMIDE 40 MG PO TABS: 40.0000 mg | ORAL_TABLET | Freq: Two times a day (BID) | ORAL | 6 refills | Status: DC

## 2017-09-09 NOTE — Telephone Encounter (Signed)
[facility] faxed a refill request for the following medication. Thanks CC  furosemide (LASIX) 40 MG tablet

## 2017-09-09 NOTE — Telephone Encounter (Signed)
[facility] faxed a refill request for the following medication. Thanks CC  dicyclomine (BENTYL) 10 MG capsule

## 2017-09-10 ENCOUNTER — Other Ambulatory Visit: Payer: Self-pay

## 2017-09-10 DIAGNOSIS — R600 Localized edema: Secondary | ICD-10-CM

## 2017-09-10 MED ORDER — FUROSEMIDE 40 MG PO TABS: 40.0000 mg | ORAL_TABLET | Freq: Two times a day (BID) | ORAL | 6 refills | Status: DC

## 2017-09-12 ENCOUNTER — Ambulatory Visit: Payer: Medicaid Other | Admitting: Podiatry

## 2017-09-12 ENCOUNTER — Encounter: Payer: Self-pay | Admitting: Podiatry

## 2017-09-12 DIAGNOSIS — E1142 Type 2 diabetes mellitus with diabetic polyneuropathy: Secondary | ICD-10-CM | POA: Diagnosis not present

## 2017-09-12 DIAGNOSIS — B351 Tinea unguium: Secondary | ICD-10-CM

## 2017-09-12 DIAGNOSIS — M79676 Pain in unspecified toe(s): Secondary | ICD-10-CM | POA: Diagnosis not present

## 2017-09-12 NOTE — Progress Notes (Signed)
Complaint:  Visit Type: Patient returns to my office for continued preventative foot care services. Complaint: Patient states" my nails have grown long and thick and become painful to walk and wear shoes" Patient has been diagnosed with DM with neuropathy. The patient presents for preventative foot care services. No changes to ROS  Podiatric Exam: Vascular: dorsalis pedis and posterior tibial pulses are absent  bilateral. Capillary return is immediate. Temperature gradient is WNL. Skin turgor WNL  Sensorium: Diminished  Semmes Weinstein monofilament test. Normal tactile sensation bilaterally. Nail Exam: Pt has thick disfigured discolored nails with subungual debris noted bilateral entire nail hallux through fifth toenails Ulcer Exam: There is no evidence of ulcer or pre-ulcerative changes or infection. Orthopedic Exam: Muscle tone and strength are WNL. No limitations in general ROM. No crepitus or effusions noted. Foot type and digits show no abnormalities. Bony prominences are unremarkable. Skin: No Porokeratosis. No infection or ulcers  Diagnosis:  Onychomycosis, , Pain in right toe, pain in left toes  Treatment & Plan Procedures and Treatment: Consent by patient was obtained for treatment procedures. The patient understood the discussion of treatment and procedures well. All questions were answered thoroughly reviewed. Debridement of mycotic and hypertrophic toenails, 1 through 5 bilateral and clearing of subungual debris. No ulceration, no infection noted.  Return Visit-Office Procedure: Patient instructed to return to the office for a follow up visit 4 months for continued evaluation and treatment.    Helane GuntherGregory Ethelene Closser DPM

## 2017-09-13 ENCOUNTER — Ambulatory Visit: Payer: Self-pay | Admitting: Family Medicine

## 2017-09-19 ENCOUNTER — Encounter: Payer: Self-pay | Admitting: Family Medicine

## 2017-09-19 ENCOUNTER — Ambulatory Visit: Payer: Self-pay | Admitting: *Deleted

## 2017-09-19 ENCOUNTER — Ambulatory Visit (INDEPENDENT_AMBULATORY_CARE_PROVIDER_SITE_OTHER): Payer: Medicaid Other | Admitting: Family Medicine

## 2017-09-19 VITALS — BP 128/70 | HR 96 | Temp 97.8°F | Resp 20 | Ht 70.0 in | Wt 289.0 lb

## 2017-09-19 DIAGNOSIS — E1140 Type 2 diabetes mellitus with diabetic neuropathy, unspecified: Secondary | ICD-10-CM | POA: Diagnosis not present

## 2017-09-19 DIAGNOSIS — N183 Chronic kidney disease, stage 3 unspecified: Secondary | ICD-10-CM

## 2017-09-19 DIAGNOSIS — Z6841 Body Mass Index (BMI) 40.0 and over, adult: Secondary | ICD-10-CM

## 2017-09-19 DIAGNOSIS — F200 Paranoid schizophrenia: Secondary | ICD-10-CM

## 2017-09-19 DIAGNOSIS — E1142 Type 2 diabetes mellitus with diabetic polyneuropathy: Secondary | ICD-10-CM | POA: Diagnosis not present

## 2017-09-19 NOTE — Progress Notes (Signed)
Patient: Christy [name] Female    DOB: 13-Dec-1955   62 y.o.   MRN: [id number] Visit Date: 09/19/2017  Today's Provider: Megan Mans, MD   Chief Complaint  Patient presents with  . Diabetes   Subjective:    HPI  Diabetes Mellitus Type II, Follow-up:   Lab Results  Component Value Date   HGBA1C 11.5 06/07/2017   HGBA1C 8.0 11/17/2016   HGBA1C 8.3 02/10/2015    Last seen for diabetes 3 months ago.  Management since then includes increasing Metformin to 1000mg  BID and Glimepiride 4mg  2 tablets daily (saw Toni Arthurs, [place]). She reports good compliance with treatment. She is not having side effects.  Current symptoms include none and have been stable. Home blood sugar records: fasting range: 130s-200s, depends per patient.  Episodes of hypoglycemia? no   Most Recent Eye Exam: due Weight trend: stable Prior visit with dietician: no Current diet: well balanced Current exercise: none  Pertinent Labs:    Component Value Date/Time   CHOL 182 04/15/2015 1205   CHOL 235 (H) 08/30/2012 0531   TRIG 215 (H) 04/15/2015 1205   TRIG 131 08/30/2012 0531   HDL 46 04/15/2015 1205   HDL 57 08/30/2012 0531   LDLCALC 93 04/15/2015 1205   LDLCALC 152 (H) 08/30/2012 0531   CREATININE 1.15 (H) 01/27/2017 1221   CREATININE 0.84 03/16/2013 1256    Wt Readings from Last 3 Encounters:  09/19/17 289 lb (131.1 kg)  06/07/17 289 lb (131.1 kg)  04/29/17 289 lb 12.8 oz (131.5 kg)       Allergies  Allergen Reactions  . Benzoyl Peroxide   . Cephalexin Itching  . Peroxide [Hydrogen Peroxide] Swelling  . Shellfish Allergy Nausea And Vomiting     Current Outpatient Medications:  .  ACCU-CHEK SOFTCLIX LANCETS lancets, Check sugar once daily  DX E11.9, Disp: 100 each, Rfl: 12 .  acetaminophen (TYLENOL) 500 MG tablet, Two tablets 3 x day for knee pain, Disp: 180 tablet, Rfl: 1 .  albuterol (PROVENTIL HFA;VENTOLIN HFA) 108 (90 Base) MCG/ACT inhaler, Inhale 1-2 puffs into the  lungs every 6 (six) hours as needed for wheezing or shortness of breath., Disp: 3 Inhaler, Rfl: 3 .  Amantadine HCl 100 MG tablet, Take 1 tablet (100 mg total) by mouth 2 (two) times daily., Disp: , Rfl:  .  aspirin EC 81 MG tablet, Take 81 mg by mouth daily., Disp: , Rfl:  .  benztropine (COGENTIN) 0.5 MG tablet, Take 0.5 mg by mouth 2 (two) times daily. One tab in the morning and 2 tabs at bedtime, Disp: , Rfl:  .  Denosumab (PROLIA [place]), Prolia, Disp: , Rfl:  .  dicyclomine (BENTYL) 10 MG capsule, Take 1 capsule (10 mg total) by mouth 2 (two) times daily., Disp: 60 capsule, Rfl: 11 .  docusate sodium (COLACE) 100 MG capsule, Take 1 capsule (100 mg total) by mouth daily., Disp: 30 capsule, Rfl: 0 .  fluticasone (FLONASE) 50 MCG/ACT nasal spray, Place 2 sprays into both nostrils daily., Disp: 16 g, Rfl: 6 .  furosemide (LASIX) 40 MG tablet, Take 1 tablet (40 mg total) by mouth 2 (two) times daily., Disp: 60 tablet, Rfl: 6 .  glimepiride (AMARYL) 4 MG tablet, Two tablets daily with breakfast, Disp: 180 tablet, Rfl: 1 .  glucose blood (ACCU-CHEK AVIVA) test strip, Check sugar once daily DX E11.9, Disp: 50 each, Rfl: 12 .  Lancet Devices (ACCU-CHEK SOFTCLIX) lancets, Check sugar once daily,  DX E11.9, Disp: 50 each, Rfl: 12 .  loratadine (CLARITIN) 10 MG tablet, Take 1 tablet (10 mg total) by mouth daily., Disp: 30 tablet, Rfl: 12 .  metFORMIN (GLUCOPHAGE) 1000 MG tablet, Take 1 tablet (1,000 mg total) by mouth 2 (two) times daily with a meal., Disp: 180 tablet, Rfl: 3 .  pantoprazole (PROTONIX) 40 MG tablet, Take 40 mg by mouth daily., Disp: , Rfl:  .  potassium chloride SA (K-DUR,KLOR-CON) 20 MEQ tablet, Take 1 tablet (20 mEq total) by mouth daily., Disp: 90 tablet, Rfl: 1 .  pregabalin (LYRICA) 50 MG capsule, Take 50 mg by mouth 3 (three) times daily., Disp: , Rfl:  .  ranitidine (ZANTAC) 150 MG tablet, Take 1 tablet (150 mg total) by mouth 2 (two) times daily., Disp: 60 tablet, Rfl: 12 .   simvastatin (ZOCOR) 10 MG tablet, Take 1 tablet (10 mg total) by mouth daily., Disp: 30 tablet, Rfl: 12 .  triamterene-hydrochlorothiazide (MAXZIDE-25) 37.5-25 MG tablet, Take 1 tablet by mouth daily., Disp: 30 tablet, Rfl: 12 .  valACYclovir (VALTREX) 1000 MG tablet, Take 1 tablet (1,000 mg total) by mouth daily., Disp: 90 tablet, Rfl: 3 .  Valbenazine Tosylate (INGREZZA) 80 MG CAPS, Take by mouth daily., Disp: , Rfl:  .  ibuprofen (ADVIL,MOTRIN) 600 MG tablet, Take 1 tablet (600 mg total) by mouth every 8 (eight) hours as needed. (Patient not taking: Reported on 06/07/2017), Disp: 30 tablet, Rfl: 0 .  miconazole (MICOTIN) 2 % cream, Apply 1 application topically 2 (two) times daily. (Patient not taking: Reported on 06/07/2017), Disp: 28.35 g, Rfl: 0 .  ondansetron (ZOFRAN) 4 MG tablet, Take 1 tablet (4 mg total) by mouth every 8 (eight) hours as needed for nausea or vomiting. (Patient not taking: Reported on 06/07/2017), Disp: 60 tablet, Rfl: 5 .  terconazole (TERAZOL 3) 0.8 % vaginal cream, Place 1 applicator vaginally at bedtime. (Patient not taking: Reported on 01/27/2017), Disp: 20 g, Rfl: 1  Review of Systems  Constitutional: Negative for activity change, appetite change and fatigue.  HENT: Negative.   Eyes: Negative.   Respiratory: Negative for cough and shortness of breath.   Cardiovascular: Negative.   Gastrointestinal: Negative.   Endocrine: Negative.   Musculoskeletal: Positive for arthralgias.  Allergic/Immunologic: Negative.   Hematological: Negative.   Psychiatric/Behavioral: Negative.     Social History   Tobacco Use  . Smoking status: Current Some Day Smoker    Packs/day: 0.00    Types: Cigarettes  . Smokeless tobacco: Never Used  . Tobacco comment: only smokes sometimes  Substance Use Topics  . Alcohol use: Yes    Comment: beer occasionally   Objective:   BP 128/70 (BP Location: Left Arm, Patient Position: Sitting, Cuff Size: Large)   Pulse 96   Temp 97.8 F (36.6  C)   Resp 20   Ht 5\' 10"  (1.778 m)   Wt 289 lb (131.1 kg)   SpO2 95%   BMI 41.47 kg/m  Vitals:   09/19/17 1511  BP: 128/70  Pulse: 96  Resp: 20  Temp: 97.8 F (36.6 C)  SpO2: 95%  Weight: 289 lb (131.1 kg)  Height: 5\' 10"  (1.778 m)     Physical Exam  Constitutional: She is oriented to person, place, and time. She appears well-developed and well-nourished.  Obese BF NAD  HENT:  Head: Normocephalic and atraumatic.  Eyes: Conjunctivae are normal. No scleral icterus.  Neck: No thyromegaly present.  Cardiovascular: Normal rate, regular rhythm and normal heart sounds.  Pulmonary/Chest: Effort normal and breath sounds normal.  Abdominal: Soft.  Neurological: She is alert and oriented to person, place, and time.  Nonfocal.  Skin: Skin is warm and dry.  Psychiatric: She has a normal mood and affect. Her behavior is normal. Judgment and thought content normal.        Assessment & Plan:     1. Type 2 diabetes mellitus with diabetic neuropathy, without long-term current use of insulin (HCC) Refer to chronic Care Management. Pt in agreement. - POCT glycosylated hemoglobin (Hb A1C) 2.Schizophrenia Stable on meds. Must have daily help,just to make sure meds are taken if nothing else. 3.morbid Obesity Improved but only issue is pt feels she is too thin at 289 lbs. Will encourage continues weight loss.     I have done the exam and reviewed the above chart and it is accurate to the best of my knowledge. [employer]  technology has been used in this note in any air is in the dictation or transcription are unintentional.  Megan Mansichard Gilbert Jr, MD  [facility]

## 2017-09-19 NOTE — Progress Notes (Signed)
   Chronic Care Management   Initial Consultation Note  09/19/2017 Name: PRESLY STEINRUCK MRN: [id number] DOB: 1955-05-15  Referred by: Jerrol Banana., MD Reason for referral : Diabetes (CCM Initial Consultation)  Subjective: "I'll come back and talk to you about my diabetes!"  Objective: HgA1C 09/19/17: 9.1  Assessment: Ms. Reddick was referred to the CCM program by her primary care provider to address DM management. Ms. Fodor engaged by the RN Case Manager and Pharmacist in person today and agrees to further outreach.   Plan: RN Case Manager will contact patient by phone to schedule initial CCM office visit.  Clinical Goals: Improvement in glucose management and HgA1C   Goals    . Patient Stated     Patient wishes to work with nurse case manager and pharmacist to gain better control of diabetes      Ms. Zody was given information about Chronic Care Management services today including:  1. CCM service includes personalized support from designated clinical staff supervised by her physician, including individualized plan of care and coordination with other care providers 2. 24/7 contact phone numbers for assistance for urgent and routine care needs. 3. Service will only be billed when office clinical staff spend 20 minutes or more in a month to coordinate care. 4. Only one practitioner may furnish and bill the service in a calendar month. 5. The patient may stop CCM services at any time (effective at the end of the month) by phone call to the office staff. 6. The patient will be responsible for cost sharing (co-pay) of up to 20% of the service fee (after annual deductible is met).  Patient agreed to services and verbal consent obtained.   [place] CCM Nurse Care Coordinator [phone]

## 2017-09-19 NOTE — Patient Instructions (Signed)
Ms. Joswick was given information about Chronic Care Management services today including:  1. CCM service includes personalized support from designated clinical staff supervised by her physician, including individualized plan of care and coordination with other care providers 2. 24/7 contact phone numbers for assistance for urgent and routine care needs. 3. Service will only be billed when office clinical staff spend 20 minutes or more in a month to coordinate care. 4. Only one practitioner may furnish and bill the service in a calendar month. 5. The patient may stop CCM services at any time (effective at the end of the month) by phone call to the office staff. 6. The patient will be responsible for cost sharing (co-pay) of up to 20% of the service fee (after annual deductible is met).  Patient agreed to services and verbal consent obtained.

## 2017-09-28 ENCOUNTER — Ambulatory Visit: Payer: Self-pay | Admitting: *Deleted

## 2017-09-28 DIAGNOSIS — E1140 Type 2 diabetes mellitus with diabetic neuropathy, unspecified: Secondary | ICD-10-CM

## 2017-09-28 NOTE — Progress Notes (Signed)
  Chronic Care Management   Telephone Note   09/28/2017 Name: Christy [name] MRN: [id number] DOB: [birth year]  Referred by: Maple Hudson., MD Reason for referral : Diabetes (Care Coordination)  I reached out to Ms. Hatcher by phone today as promised. We have scheduled a CCM Nurse/Pharmacist Visit for Monday, October 03, 2017 @ 10am to address needs related to DM management. I advised Ms. Duer to bring her medications to the visit.   Lab Results  Component Value Date   HGBA1C 11.5 06/07/2017    Marja Kays MHA,BSN,RN,CCM Nurse Care Coordinator [facility] Care Management [phone]

## 2017-09-28 NOTE — Patient Instructions (Signed)
Please bring all your medicine to your visit with the CCM nurse. Thank you!

## 2017-10-03 ENCOUNTER — Ambulatory Visit: Payer: Self-pay | Admitting: *Deleted

## 2017-10-03 ENCOUNTER — Ambulatory Visit: Payer: Medicaid Other

## 2017-10-03 ENCOUNTER — Encounter: Payer: Self-pay | Admitting: *Deleted

## 2017-10-03 DIAGNOSIS — Z6841 Body Mass Index (BMI) 40.0 and over, adult: Secondary | ICD-10-CM

## 2017-10-03 DIAGNOSIS — E1140 Type 2 diabetes mellitus with diabetic neuropathy, unspecified: Secondary | ICD-10-CM

## 2017-10-03 LAB — POCT GLYCOSYLATED HEMOGLOBIN (HGB A1C): HEMOGLOBIN A1C: 9.1 % — AB (ref 4.0–5.6)

## 2017-10-03 NOTE — Chronic Care Management (AMB) (Addendum)
Chronic Care Management   Note  10/03/2017 Name: Christy [name] MRN: [id number] DOB: 01-Jun-1955    Chronic Care Management   Initial Visit Note  10/03/2017 Name: Christy [name] MRN: [id number] DOB: 04/21/1955  Referred by: Jerrol Banana., MD Reason for referral : Chronic Care Management (DM Disease Management)  Subjective: "I want to get doing better on my sugars"   Objective: Lab Results  Component Value Date   HGBA1C 9.1 (A) 10/03/2017     Medications Reviewed Today    Reviewed by Clerance Lav, RN (Registered Nurse) on 10/03/17 at 81  Med List Status: <None>  Medication Order Taking? Sig Documenting Provider Last Dose Status Informant  ACCU-CHEK SOFTCLIX LANCETS lancets [id number]  Check sugar once daily  DX E11.9 Jerrol Banana., MD  Active   acetaminophen (TYLENOL) 500 MG tablet [id number]  Two tablets 3 x day for knee pain Carmon Ginsberg, PA  Active   albuterol (PROVENTIL HFA;VENTOLIN HFA) 108 (90 Base) MCG/ACT inhaler [id number]  Inhale 1-2 puffs into the lungs every 6 (six) hours as needed for wheezing or shortness of breath. Jerrol Banana., MD  Active   Amantadine HCl 100 MG tablet [id number]  Take 1 tablet (100 mg total) by mouth 2 (two) times daily. Jerrol Banana., MD  Active   aspirin EC 81 MG tablet [id number]  Take 81 mg by mouth daily. [provider]  Active Self  benztropine (COGENTIN) 0.5 MG tablet [id number]  Take 0.5 mg by mouth 2 (two) times daily. One tab in the morning and 2 tabs at bedtime [provider]  Active   Denosumab [place]) [id number]  Prolia [provider]  Active   dicyclomine (BENTYL) 10 MG capsule [id number]  Take 1 capsule (10 mg total) by mouth 2 (two) times daily. Jerrol Banana., MD  Active   docusate sodium (COLACE) 100 MG capsule [id number]  Take 1 capsule (100 mg total) by mouth daily. Jerrol Banana., MD  Active   fluticasone [facility]) 50 MCG/ACT nasal spray [id number]   Place 2 sprays into both nostrils daily. Jerrol Banana., MD  Active   furosemide (LASIX) 40 MG tablet [id number]  Take 1 tablet (40 mg total) by mouth 2 (two) times daily. Jerrol Banana., MD  Active   glimepiride [facility]) 4 MG tablet [id number]  Two tablets daily with breakfast Carmon Ginsberg, [place]  Active   glucose blood (ACCU-CHEK AVIVA) test strip [id number]  Check sugar once daily DX E11.9 Jerrol Banana., MD  Active   ibuprofen (ADVIL,MOTRIN) 600 MG tablet [id number]  Take 1 tablet (600 mg total) by mouth every 8 (eight) hours as needed.  Patient not taking:  Reported on 06/07/2017   Jerrol Banana., MD  Active   Lancet Devices [facility] Xxx) lancets [id number]  Check sugar once daily, DX E11.9 Jerrol Banana., MD  Active   loratadine (CLARITIN) 10 MG tablet [id number]  Take 1 tablet (10 mg total) by mouth daily. Jerrol Banana., MD  Active   metFORMIN (GLUCOPHAGE) 1000 MG tablet [id number]  Take 1 tablet (1,000 mg total) by mouth 2 (two) times daily with a meal. Carmon Ginsberg, PA  Active   miconazole (MICOTIN) 2 % cream [id number]  Apply 1 application topically 2 (two) times daily.  Patient not taking:  Reported on 06/07/2017   Carmon Ginsberg, [place]  Active   ondansetron [facility]) 4 MG tablet [id number]  Take 1 tablet (4 mg total) by mouth every 8 (eight) hours as needed for nausea or vomiting.  Patient not taking:  Reported on 06/07/2017   Jerrol Banana., MD  Active   pantoprazole (PROTONIX) 40 MG tablet [id number]  Take 40 mg by mouth daily. [provider]  Active   potassium chloride SA (K-DUR,KLOR-CON) 20 MEQ tablet [id number]  Take 1 tablet (20 mEq total) by mouth daily. Jerrol Banana., MD  Active   pregabalin (LYRICA) 50 MG capsule [id number]  Take 50 mg by mouth 3 (three) times daily. [provider]  Active   ranitidine (ZANTAC) 150 MG tablet [id number]  Take 1 tablet (150 mg total) by mouth 2 (two) times daily.  Jerrol Banana., MD  Active   simvastatin (ZOCOR) 10 MG tablet [id number]  Take 1 tablet (10 mg total) by mouth daily. Jerrol Banana., MD  Active   terconazole (TERAZOL 3) 0.8 % vaginal cream [id number]  Place 1 applicator vaginally at bedtime.  Patient not taking:  Reported on 01/27/2017   Jerrol Banana., MD  Active   triamterene-hydrochlorothiazide [facility]) 37.5-25 MG tablet [id number]  Take 1 tablet by mouth daily. Jerrol Banana., MD  Active   valACYclovir (VALTREX) 1000 MG tablet [id number]  Take 1 tablet (1,000 mg total) by mouth daily. Carmon Ginsberg, PA  Active   Valbenazine Tosylate [facility]) 80 MG CAPS [id number]  Take by mouth daily. [provider]  Active          Assessment: Very good compliance with medications using pill packaging from [place]. Pill packs are delivered to her weekly by the pharmacy (Wednesdays). [name] is not experiencing relief from arthritis in her knees from the meloxicam. NSAIDs not recommended due to CKD Stage III. [name] has been getting steroid injections in knee to reduce arthritis pain but she says it wears off after a few weeks. This is also likely exacerbating diabetes due to corticosteroid effects on blood sugars.    Problem 1: Medication Adherence   Goals:  Goals Addressed            This Visit's Progress   . "I want to get better control of my sugar" (pt-stated)       1. Check cbg once daily and record 2. Eat 3 times daily 3. Continue medications as prescribed 4. Call for signs or symptoms as discussed     . Patient Stated       "I am doing good taking my medicines" 1. Compliance packaging from [employer] 2. Take morning medicines by 10AM and evening medicines by 10PM      Clinical Goals: Continue with excellent medication adherence. Take morning medications by 10AM and evening medications by 10PM.   Interventions:  1. Medication review and counseling 2.  Pharmacist to seek alternatives for arthritic knee pain and follow up with patient.  Plan: Follow up in 10 days   Plan:    [name] was given information about Chronic Care Management services today including:  1. CCM service includes personalized support from designated clinical staff supervised by her physician, including individualized plan of care and coordination with other care providers 2. 24/7 contact phone numbers for assistance for urgent and routine care needs. 3. Service will only be billed when office clinical staff spend 20 minutes or more in a month to coordinate care. 4. Only one practitioner may furnish and bill the service in a  calendar month. 5. The patient may stop CCM services at any time (effective at the end of the month) by phone call to the office staff. 6. The patient will be responsible for cost sharing (co-pay) of up to 20% of the service fee (after annual deductible is met).  Patient agreed to services and verbal consent obtained.  Ruben Reason, PharmD Clinical Pharmacist [place] [phone]

## 2017-10-03 NOTE — Progress Notes (Signed)
Chronic Care Management   Initial Visit Note  10/03/2017 Name: SIMREN POPSON MRN: [id number] DOB: 05-18-1955  Referred by: Jerrol Banana., MD Reason for referral : Chronic Care Management (DM Disease Management)  Subjective: "I want to get doing better on my sugar.  Objective:  Lab Results  Component Value Date        HGBA1C 9.1 09/19/2017   HGBA1C 11.5 06/07/2017   HGBA1C 8.0 11/17/2016   HGBA1C 8.3 02/10/2015   Lab Results  Component Value Date   LDLCALC 93 04/15/2015   CREATININE 1.15 (H) 01/27/2017   Lab Results  Component Value Date   POCGLU 365 (A) 04/19/2016   POCGLU 320 (A) 05/27/2015   BMI Readings from Last 2 Encounters:  09/19/17 41.47 kg/m  06/07/17 42.68 kg/m   Assessment:Ms. Hallmark was referred to the CCM program by her primary care provider to address DM and medication management concerns.   Diabetes Disease Management - Ms. Ditullio's HgA1C has increased from 8 (Oct '18) to 11.5 (May 19). She brings in her CBG monitor today and is able to demonstrate appropriate technique for checking her cbg which today is 183. She relates that she felt weak and somewhat lightheaded this morning before eating. She had spaghetti and a piece of fried chicken before our visit. Ms. Willaims is not checking her cbg daily and she is not eating more than twice daily, sometimes going > 12 hours between meals (when she skips an evening meal).   Goals Addressed            This Visit's Progress   . "I want to get better control of my sugar" (pt-stated)       1. Check cbg once daily and record 2. Eat 3 times daily 3. Continue medications as prescribed 4. Call for signs or symptoms as discussed       Clinical Goals: 1. Over the next 2 weeks, Ms. Mclin will check her cbg daily and record 2. Ms. Kaylor will eat 3 times daily without fail. 3. Over the next 2 weeks, Ms. Fiorillo will call the CCM Nurse or Pharmacist for cbg findings < 100 or > 300 or for mild symptoms of hypo or  hyperglycemia.  4. Ms. Burgo will call 911 for acute symptoms.   Interventions:   1. DM Education Provided including:  A. Hands on instruction re: how to perform cbg checks and record B. Signs and Symptoms of hypoglycemia and hyperglycemia and when/who to        call for help  Plan: Ms. Vaughan will return in 10 days for a CCM follow up visit.   Ms. Vanalstine was given information about Chronic Care Management services today including:  1. CCM service includes personalized support from designated clinical staff supervised by her physician, including individualized plan of care and coordination with other care providers 2. 24/7 contact phone numbers for assistance for urgent and routine care needs. 3. Service will only be billed when office clinical staff spend 20 minutes or more in a month to coordinate care. 4. Only one practitioner may furnish and bill the service in a calendar month. 5. The patient may stop CCM services at any time (effective at the end of the month) by phone call to the office staff. 6. The patient will be responsible for cost sharing (co-pay) of up to 20% of the service fee (after annual deductible is met).  Patient agreed to services and verbal consent obtained.  Coqui  [facility] Care Management [phone]

## 2017-10-03 NOTE — Patient Instructions (Addendum)
1. Check your sugar with your machine every day and write it on your paper in the green boxes.   2. Eat 3 times a day, every day.   3. If you feel lightheaded, dizzy, sick to your stomach, sweaty, or weak, call for help.   4. If you feel really sick and eating doesn't make you feel better or if you feel like you are going to pass out, call 911.  5. Keep doing a great job on taking your medicine!  6. Call Serina Cowper or Raynelle Fanning if you have questions:   Marja Kays [facility] Nurse Care Coordinator  [phone]   [name] PharmD  Clinical Pharmacist  [phone]

## 2017-10-03 NOTE — Addendum Note (Signed)
Addended by: Andee Poles on: 10/03/2017 02:13 PM   Modules accepted: Orders

## 2017-10-12 ENCOUNTER — Ambulatory Visit: Payer: Medicaid Other

## 2017-10-12 VITALS — BP 104/78

## 2017-10-12 DIAGNOSIS — Z6841 Body Mass Index (BMI) 40.0 and over, adult: Secondary | ICD-10-CM

## 2017-10-12 DIAGNOSIS — E1140 Type 2 diabetes mellitus with diabetic neuropathy, unspecified: Secondary | ICD-10-CM

## 2017-10-12 NOTE — Patient Instructions (Signed)
1. Please check your blood sugar in the afternoons and write it on your paper.   2. Call Raynelle FanningJulie or Serina Cowperlisa if you have questions.   3. Come back to see us on Monday, September 29 at 1pm.     CCM (Chronic Care Management) Team    Marja KaysAlisa Gilboy [facility] CenterMHA,BSN,RN,CCM Nurse Care Coordinator  [phone]   Karalee HeightJulie Hedrick PharmD  Clinical Pharmacist  [phone]

## 2017-10-12 NOTE — Chronic Care Management (AMB) (Signed)
  Care Management   Follow Up Note   10/12/2017 Name: Christy [name] MRN: [id number] DOB: Sep 26, 1955  Referred by: Christy HudsonGilbert, Richard L Jr., MD Reason for referral : Chronic Care Management (DM)    Subjective: "I'm just making sure I'm doing okay."  Objective: BP 104/78     Lab Results  Component Value Date   HGBA1C 9.1 (A) 10/03/2017   Assessment: Christy [name] was referred to the CCM program by her primary care provider to address DM and medication management concerns.   Diabetes Disease Management - Christy [name]'s HgA1C has increased from 8 (Oct '18) to 11.5 (May 19) and back down to 9.1 as of 10/03/17. She did not bring in her CBG monitor today but brought in her cbg documentation tool and showed me that she has checked her cbg three times since she last visited us. Her fasting cbg findings have been between 183-242. As instructed during our last visit, Christy [name] is eating a snack in the evening to avoid feeling "weak and sweaty" in the morning (she was previously going about 12 hours between meals). However, she tells me today that she is mostly drinking diet soda and water but is also drinking a regular soda most days. I advised her to drink mostly water and diet soda only intermittently.    Goals Addressed            This Visit's Progress   . "I want to get better control of my sugar" (pt-stated)       1. Check cbg once daily, in the afternoons, and record 2. Eat 3 times daily (don't forget an evening snack) 3. If you drink soda, make it diet soda. Drink water rather than soda if you can.  4. Continue medications as prescribed 5. Call for signs or symptoms as discussed     Clinical Goals: 1. Over the next 2 weeks, Christy [name] will check her cbg daily in the afternoon and record 2. Christy [name] will eat 3 times daily without fail.  3. Over the next 2 weeks, Christy [name] will call the CCM Nurse or Pharmacist for cbg findings < 100 or > 300 or for mild symptoms of hypo or hyperglycemia.  4. Ms.  Charyl [name] will call 911 for acute symptoms.   Interventions: Diabetes education including review of dietary guidelines, rationale for daily cbg checks, when to call for findings outside established parameters.   Plan: Christy [name] will return in just under 2 weeks for a case management follow up visit.   Marja KaysAlisa Prudy Candy MHA,BSN,RN,CCM Nurse Care Coordinator [facility] Care Management [phone]

## 2017-10-17 ENCOUNTER — Ambulatory Visit: Payer: Self-pay

## 2017-10-17 ENCOUNTER — Telehealth: Payer: Self-pay | Admitting: Family Medicine

## 2017-10-17 NOTE — Telephone Encounter (Signed)
Pt called saying she has a yeast infection and [facility]  Thanks Fortune Brandsteri

## 2017-10-17 NOTE — Telephone Encounter (Signed)
Please advise 

## 2017-10-20 ENCOUNTER — Other Ambulatory Visit: Payer: Self-pay | Admitting: Family Medicine

## 2017-10-20 DIAGNOSIS — B3731 Acute candidiasis of vulva and vagina: Secondary | ICD-10-CM

## 2017-10-20 DIAGNOSIS — N898 Other specified noninflammatory disorders of vagina: Secondary | ICD-10-CM

## 2017-10-20 DIAGNOSIS — B373 Candidiasis of vulva and vagina: Secondary | ICD-10-CM

## 2017-10-20 MED ORDER — TERCONAZOLE 0.8 % VA CREA: 1.0000 | TOPICAL_CREAM | Freq: Every day | VAGINAL | 1 refills | Status: DC

## 2017-10-20 NOTE — Telephone Encounter (Signed)
[facility] faxed refill request for the following medications:  terconazole (TERAZOL 3) 0.8 % vaginal cream    Please advise.

## 2017-10-24 ENCOUNTER — Ambulatory Visit: Payer: Self-pay

## 2017-10-27 ENCOUNTER — Ambulatory Visit: Payer: Medicaid Other

## 2017-10-27 DIAGNOSIS — E1140 Type 2 diabetes mellitus with diabetic neuropathy, unspecified: Secondary | ICD-10-CM

## 2017-10-27 DIAGNOSIS — Z6841 Body Mass Index (BMI) 40.0 and over, adult: Secondary | ICD-10-CM

## 2017-10-27 NOTE — Patient Instructions (Signed)
1. Check cbg once daily and write it on your paper 2. Eat 3 times daily - try to have a salad or green beans or other vegetables instead of fries and onion rings when your friends get food for you.  3. If you drink soda, make it diet soda. Drink water rather than soda if you can.  4. Cut down to 2 spoons of sugar instead of 3 spoons of sugar in your cereal.  5. Keep taking your medicine from your packs 6. Call Alisa (nurse) or Raynelle Fanning (pharmacist) if you have a blood sugar reading that is lower than 100 or if you have a blood sugar reading that is higher than 300.   CCM (Chronic Care Management) Team   Marja Kays [facility] Nurse Care Coordinator  [phone]   [name] PharmD  Clinical Pharmacist  [phone]

## 2017-10-27 NOTE — Chronic Care Management (AMB) (Signed)
  Chronic Care Management   Follow Up Note   10/27/2017 Name: Christy [name] MRN: [id number] DOB: 03/20/1955  Referred by: Maple Hudson., MD Reason for referral : Chronic Care Management (DM)  Subjective: "I forgot to write down my sugar"  Objective: Lab Results  Component Value Date   HGBA1C 9.1 (A) 10/03/2017    Assessment: Christy [name] was referred to the CCM program by her primary care provider to address [facility] medication management concerns.   Goals    . "I don't have anybody who can answer for me if I get sick" (pt-stated)     Discussed advanced directives with patient today who states she currently does not have a family member or friend who is willing to speak on her behalf or help her with healthcare decisions. She says she trusts her cousin [name] in [place] but doesn't have her contact information today. Christy [name] agrees to let me contact her cousin and is going to call me with her number so I can discuss advanced directives and/or her willingness to be a contact for Christy [name].   Clinical Goals: Over the next 14 days, patient will verbalize understanding of plans for advanced directives and/or reliable contact/family help  Interventions: discussed Advanced Directives and family contact/help if needed     . "I want to get better control of my sugar" (pt-stated)     Patient is checking cbg at home but not always recording. She says her cbg yesterday was 149. Today in the office we checked a cbg = 225. She reports mild headache. She continues to have difficulty acting on dietary instructions but she is making an effort. Patient does not report other signs or symptoms of hypo or hyperglycemia.   Clinical Goals: Over the next 14 days, patient will check cbg daily and record and will continue to work on dietary adherence  Interventions: review of cbg log; education re: importance of checking cbg's daily and recording; educated on carb modified diet      . Patient Stated     "I am doing good taking my medicines"    Plan: Telephone follow up over the next 2 weeks.  Marja Kays MHA,BSN,RN,CCM Nurse Care Coordinator [facility] Care Management [phone]

## 2017-11-07 ENCOUNTER — Other Ambulatory Visit: Payer: Self-pay

## 2017-11-07 MED ORDER — FLUTICASONE PROPIONATE 50 MCG/ACT NA SUSP: 2.0000 | Freq: Every day | NASAL | 12 refills | Status: DC | PRN

## 2017-11-08 ENCOUNTER — Ambulatory Visit: Payer: Self-pay

## 2017-11-08 DIAGNOSIS — Z6841 Body Mass Index (BMI) 40.0 and over, adult: Secondary | ICD-10-CM

## 2017-11-08 DIAGNOSIS — E1140 Type 2 diabetes mellitus with diabetic neuropathy, unspecified: Secondary | ICD-10-CM

## 2017-11-08 NOTE — Chronic Care Management (AMB) (Signed)
  Chronic Care Management   Follow Up Note   11/08/2017 Name: Christy [name] MRN: [id number] DOB: Nov 20, 1955  Referred by: Maple Hudson., MD Reason for referral : Chronic Care Management (DM)  Subjective: "She passed out this morning!" (home care nurse)  Assessment: Christy [name] was referred to the CCM program by her primary care provider to address [facility] medication management concerns.   I reached out to Christy [name] today for routine telephone follow up for disease management for DM. Her home care nurse answered the phone and put Christy [name] on who asked me to speak with her nurse as she wasn't feeling well today.   Goals Addressed    . "I want to get better control of my sugar" (pt-stated)       Patient states her cbg yesterday was 149. She reports an episode TODAY wherein she "passed out" after getting up to let her CAP worker in around 8:30am. She said she opened the door then walked back towards her recliner and "fell in it on my backside and passed out". Her CAP worker called 911 and EMS "checked her out". The nurse states "her vitals were ok" and they left. She had not eaten in > 19 hours and had taken her morning medications. AFter the incident, she ate, took her medicines, then checked her cbg and found it to be "more than 200". Patient denies chest pain, nausea, or other symptom and says she "came to pretty quick when my aide talked to me". Nurse states she will send EMS report with patient to office on Thursday. Nurse also reports that patient said she had one episode of diarrhea this morning.   Clinical Goals:   Patient will attend scheduled CCM appointment on Thursday, 11/10/17 @ 11am  Patient will call 911 if she feels lightheaded, nauseated, has chest pain, shortness of breath or any other new or worsened symptom  Patient will NOT SKIP MEALS even if she has a poor appetite  Patient will take medications exactly as directed  Interventions: review of cbg's; education re:  importance of eating on a schedule; provided parameters to patient/nurse about when to call 911; scheduled face to face appointment for Thursday; contacted Medicaid to get approval for transportation (usually needs 3 day notice; Medicaid transportation graciously obliged)   Plan: I spoke with PCP to notify him of my conversation with patient.  I will see patient in office on Thursday 11/10/17 @ 11am. Dr. Sullivan Lone, PCP, plans to see Christy [name] during our visit time.  Christy [name] MHA,BSN,RN,CCM Nurse Care Coordinator [facility] Care Management [phone]

## 2017-11-08 NOTE — Patient Instructions (Signed)
1. Eat 3 meals a day EVERY DAY! 2. Check your blood sugar EVERY DAY and write it down! 3. Come to [facility] on Thursday 11/10/17 @ 11am to see the nurse case manager.

## 2017-11-10 ENCOUNTER — Ambulatory Visit (INDEPENDENT_AMBULATORY_CARE_PROVIDER_SITE_OTHER): Payer: Medicaid Other

## 2017-11-10 ENCOUNTER — Other Ambulatory Visit: Payer: Self-pay

## 2017-11-10 DIAGNOSIS — I10 Essential (primary) hypertension: Secondary | ICD-10-CM

## 2017-11-10 DIAGNOSIS — R079 Chest pain, unspecified: Secondary | ICD-10-CM | POA: Diagnosis not present

## 2017-11-10 DIAGNOSIS — N183 Chronic kidney disease, stage 3 unspecified: Secondary | ICD-10-CM

## 2017-11-10 DIAGNOSIS — R112 Nausea with vomiting, unspecified: Secondary | ICD-10-CM

## 2017-11-10 MED ORDER — METOPROLOL SUCCINATE ER 25 MG PO TB24: 12.5000 mg | ORAL_TABLET | Freq: Every day | ORAL | 12 refills | Status: DC

## 2017-11-10 NOTE — Chronic Care Management (AMB) (Signed)
  Care Management   Note  11/10/2017 Name: Christy [name] MRN: [id number] DOB: 1955-06-25   Care Coordination with Ms. Harold and [facility] following new prescription today for metoprolol succinate 12.5mg  tablet once daily. Thurma gets her medications packaged in compliance packs, delivered every Wednesday.   Drug Therapy Interventions:  Needs Additional Therapy: Possible Untreated Condition  Syncopal episode with abdominal and back pain. New prescription:  Metoprolol succinate 12.5mg  once daily  Plan: Drug Therapy Plan: Contact Pharmacy, Counsel Patient on Adherence, Contact Provider, Patient Counseling, Refer for Pharmacy Delivery   [facility] will deliver a one week pack with just metoprolol in it tomorrow. Terressa verbalizes understanding that she is supposed to take this with her other morning medications.   Provided extensive counseling on metoprolol, including masking signs and symptoms of hypoglycemia. Strongly encouraged Jadamarie to eat consistently throughout the day, snacks with protein (ex: peanut butter crackers, cheese toast). Counseled on administration, side effects, and indication. Tyson verbalized understanding and states she will call CCM team if she has any questions.     Follow up CCM pharmacy visit via telephone in 1 week.   [name], PharmD Clinical Pharmacist [facility] [phone]

## 2017-11-10 NOTE — Progress Notes (Signed)
  Chronic Care Management   Follow Up Note   11/10/2017 Name: KAPRICE KAGE MRN: [id number] DOB: August 23, 1955  Referred by: Maple Hudson., MD Reason for referral : Chronic Care Management (syncope vs hypoglycemia)  Subjective: "I passed out at home on Tuesday"  Objective:  Orthostatic bp findings today (difficult to auscultate):  Sitting: 110/78 (left arm/large cuff) Standing: 96/70 (left arm/large cuff)  Lab Results  Component Value Date   CREATININE 1.15 (H) 01/27/2017   BUN 12 01/27/2017   NA 143 01/27/2017   K 3.7 01/27/2017   CL 100 01/27/2017   CO2 25 01/27/2017   Pulse Readings from Last 3 Encounters:  09/19/17 96  06/07/17 (!) 108  04/29/17 (!) 102   BP Readings from Last 3 Encounters:  10/12/17 104/78  09/19/17 128/70  06/07/17 122/62   Assessment:  Ms. Hoefer was referred to the CCM program by her primary care provider to address [facility] medication management concerns.I am seeing Ms. Camille in the office today for CM follow up.   Goals Addressed    . "I want to get help since I passed out" (pt-stated)       I reached out to Ms. Clason Tuesday for routine telephone follow up for disease management for DM. Her home care nurse answered the phone and put Ms. Edgell on who asked me to speak with her nurse as she wasn't feeling well. Ms. January reported an episode of "passing out". Today in the office, Ms. Naab reports that she has had several episodes of pain in her abdomen and back and relates that the episodes are often associated with nausea and vomiting. Ms. Leath states she had swelling of the top of her feet yesterday that is improved today. She says she is voiding as per her usual routine. Though her bp is difficult to auscultate, there is a differential in her sitting and standing bp. She denies dizziness, chest pain, nausea or other cv symptom in the office today. Ms Delbridge says she is taking her medications from her blister pack which includes: furosemide, maxide,  potassium, and 81mg  ASA.    Clinical Goal(s): Patient will adhere to plan of care for CV symptoms as outlined by her PCP  Interventions: Collaboration in person w/ PCP; EKG done in office (by CMA)    Plan:  PCP referral to Cardiology. RN Care Manager will follow up with patient by phone re: plans for cardiology follow and to assess symptoms and provide direction re: plan of care.    Marja Kays MHA,BSN,RN,CCM Nurse Care Coordinator [facility] Care Management [phone]

## 2017-11-14 ENCOUNTER — Other Ambulatory Visit: Payer: Self-pay | Admitting: Internal Medicine

## 2017-11-14 DIAGNOSIS — R0602 Shortness of breath: Secondary | ICD-10-CM

## 2017-11-14 DIAGNOSIS — R55 Syncope and collapse: Secondary | ICD-10-CM

## 2017-11-14 DIAGNOSIS — R011 Cardiac murmur, unspecified: Secondary | ICD-10-CM

## 2017-11-14 DIAGNOSIS — I208 Other forms of angina pectoris: Secondary | ICD-10-CM

## 2017-11-17 ENCOUNTER — Other Ambulatory Visit: Payer: Self-pay | Admitting: Family Medicine

## 2017-11-17 ENCOUNTER — Ambulatory Visit: Payer: Self-pay | Admitting: *Deleted

## 2017-11-17 DIAGNOSIS — E1140 Type 2 diabetes mellitus with diabetic neuropathy, unspecified: Secondary | ICD-10-CM

## 2017-11-17 DIAGNOSIS — R112 Nausea with vomiting, unspecified: Secondary | ICD-10-CM

## 2017-11-17 DIAGNOSIS — R079 Chest pain, unspecified: Secondary | ICD-10-CM

## 2017-11-17 NOTE — Telephone Encounter (Signed)
See refill request.

## 2017-11-17 NOTE — Patient Instructions (Signed)
1. Report to [facility] Radiology Department on 11/23/17 @ 7:45am.  2. Your Medicaid transport will pick you up.  3. I will call you the day before your appointment to remind you.  4. Please call me if you have any questions.   Marja Kays MHA,BSN,RN,CCM Nurse Care Coordinator [facility] Care Management [phone]

## 2017-11-17 NOTE — Telephone Encounter (Signed)
Please advise 

## 2017-11-17 NOTE — Chronic Care Management (AMB) (Signed)
  Chronic Care Management   Note  11/17/2017 Name: Christy [name] MRN: [id number] DOB: [birth year]   Plan: I will follow up with Christy [name] by phone after her outpatient diagnostic examinations.   Goals Addressed    . "I want to get help since I passed out" (pt-stated)       Christy [name] was seen in the office last week after she reported by phone of an episode of "passing out" and possible cardiovascular symptoms. Dr. Sullivan Lone referred Christy [name] to cardiology. Christy [name] has been scheduled for a stress test and 2DEcho as ordered by Dr. [name], Cardiology.   Clinical Goal(s): Patient will attend scheduled outpatient appointment on 11/23/17 as evidenced by notation in medical record.   Interventions: Telephone follow up with patient to ensure that she has made arrangements for her Medicaid transportation and that she understands what test she is having and where she is to go for the test.     Marja Kays MHA,BSN,RN,CCM Nurse Care Coordinator [facility] Care Management  [phone]

## 2017-11-17 NOTE — Telephone Encounter (Signed)
[facility] faxed refill request for the following medications:  glimepiride (AMARYL) 4 MG tablet  Qty: 180  Last dispensed: 09/08/2017  Please advise.

## 2017-11-21 ENCOUNTER — Telehealth: Payer: Self-pay | Admitting: Pharmacist

## 2017-11-21 ENCOUNTER — Ambulatory Visit: Payer: Self-pay | Admitting: Pharmacist

## 2017-11-21 DIAGNOSIS — Z6841 Body Mass Index (BMI) 40.0 and over, adult: Secondary | ICD-10-CM

## 2017-11-21 DIAGNOSIS — R079 Chest pain, unspecified: Secondary | ICD-10-CM

## 2017-11-21 DIAGNOSIS — I10 Essential (primary) hypertension: Secondary | ICD-10-CM

## 2017-11-21 DIAGNOSIS — E1140 Type 2 diabetes mellitus with diabetic neuropathy, unspecified: Secondary | ICD-10-CM

## 2017-11-21 NOTE — Chronic Care Management (AMB) (Signed)
  Care Management   Follow Up Note   11/21/2017 Name: Christy [name] MRN: [id number] DOB: 1955-05-19  Referred by: Maple Hudson., MD Reason for referral : No chief complaint on file.  Subjective: "I'm doing good today."   Assessment:  Does the patient  feel that his/her medications are working for him/her?  yes  Has the patient been experiencing any side effects to the medications prescribed?  no  Does the patient measure his/her own blood pressure or blood glucose at home?  yes   Does the patient have any problems obtaining medications due to transportation or finances?   no  Understanding of regimen: good Understanding of indications: fair Potential of compliance: excellent  Pharmacist Notes:  Christy [name] reports no side effects from new prescription metoprolol succinate 12.5mg  daily. She has not been experiencing any chest pain since beginning the metoprolol.   Self reported BG today (4 hours post prandial) was 235 mg/dL.   Goals Addressed            This Visit's Progress   . "I want to get help since I passed out" (pt-stated)   On track    Christy [name] was seen in the office last week after she reported by phone of an episode of "passing out" and possible cardiovascular symptoms. Dr. Sullivan Lone referred Christy [name] to cardiology. Christy [name] has been scheduled for a stress test and 2DEcho as ordered by Dr. [name], Cardiology.   Clinical Goal(s): Patient will attend scheduled outpatient appointment on 11/23/17 as evidenced by notation in medical record.   Interventions: Telephone follow up with patient to ensure that she has made arrangements for her Medicaid transportation and that she understands what test she is having and where she is to go for the test.      . Patient Stated   On track    "I am doing good taking my medicines"       Plan:  Reminded Christy [name] of upcoming stress test and echo on 11/23/17. Medicaid transportation has been arranged.   Verbal patient  instructions provided.  Total time in face to face counseling 12 minutes.  Follow up CCM Visit via telephone in 1 week.   [name], PharmD Clinical Pharmacist [facility] [phone]

## 2017-11-21 NOTE — Patient Instructions (Signed)
1. Continue monitoring your blood sugar at home.  2. Eat a well rounded meal three times a day: vegetables and protein and one-two servings of a carbohydrate.  3. Attend echocardiogram and stress test scheduled for Wednesday.  4. Keep up the great work of taking all your medications!

## 2017-11-22 ENCOUNTER — Telehealth: Payer: Self-pay

## 2017-11-23 ENCOUNTER — Ambulatory Visit
Admission: RE | Admit: 2017-11-23 | Discharge: 2017-11-23 | Disposition: A | Discharge status: Home / Self Care | Payer: Medicaid Other | Source: Ambulatory Visit | Attending: Internal Medicine | Admitting: Internal Medicine

## 2017-11-23 ENCOUNTER — Encounter
Admission: RE | Admit: 2017-11-23 | Discharge: 2017-11-23 | Disposition: A | Discharge status: Home / Self Care | Payer: Medicaid Other | Source: Ambulatory Visit | Attending: Internal Medicine | Admitting: Internal Medicine

## 2017-11-23 DIAGNOSIS — R011 Cardiac murmur, unspecified: Secondary | ICD-10-CM | POA: Diagnosis not present

## 2017-11-23 DIAGNOSIS — I10 Essential (primary) hypertension: Secondary | ICD-10-CM | POA: Insufficient documentation

## 2017-11-23 DIAGNOSIS — E785 Hyperlipidemia, unspecified: Secondary | ICD-10-CM | POA: Insufficient documentation

## 2017-11-23 DIAGNOSIS — R0602 Shortness of breath: Secondary | ICD-10-CM

## 2017-11-23 DIAGNOSIS — I208 Other forms of angina pectoris: Secondary | ICD-10-CM

## 2017-11-23 DIAGNOSIS — E119 Type 2 diabetes mellitus without complications: Secondary | ICD-10-CM | POA: Diagnosis not present

## 2017-11-23 DIAGNOSIS — R4182 Altered mental status, unspecified: Secondary | ICD-10-CM | POA: Diagnosis not present

## 2017-11-23 DIAGNOSIS — R55 Syncope and collapse: Secondary | ICD-10-CM | POA: Insufficient documentation

## 2017-11-23 LAB — NM MYOCAR MULTI W/SPECT W/WALL MOTION / EF
CHL CUP NUCLEAR SRS: 3
CHL CUP NUCLEAR SSS: 3
LV dias vol: 93 mL (ref 46–106)
LVSYSVOL: 39 mL
Peak HR: 77 {beats}/min
Rest HR: 66 {beats}/min
SDS: 0
TID: 1.19

## 2017-11-23 LAB — GLUCOSE, CAPILLARY: Glucose-Capillary: 150 mg/dL — ABNORMAL HIGH (ref 70–99)

## 2017-11-23 MED ORDER — TECHNETIUM TC 99M TETROFOSMIN IV KIT
31.3600 | PACK | Freq: Once | INTRAVENOUS | Status: AC | PRN
  Administered 2017-11-23: 31.36 via INTRAVENOUS

## 2017-11-23 MED ORDER — TECHNETIUM TC 99M TETROFOSMIN IV KIT
10.5830 | PACK | Freq: Once | INTRAVENOUS | Status: AC | PRN
  Administered 2017-11-23: 10.583 via INTRAVENOUS

## 2017-11-23 MED ORDER — GLIMEPIRIDE 4 MG PO TABS: ORAL_TABLET | ORAL | 3 refills | Status: DC

## 2017-11-23 MED ORDER — REGADENOSON 0.4 MG/5ML IV SOLN
0.4000 mg | Freq: Once | INTRAVENOUS | Status: AC
  Administered 2017-11-23: 0.4 mg via INTRAVENOUS
  Filled 2017-11-23: qty 5

## 2017-11-23 NOTE — Telephone Encounter (Signed)
Ok to rf for 1 year. 

## 2017-11-23 NOTE — Progress Notes (Signed)
*  PRELIMINARY RESULTS* Echocardiogram 2D Echocardiogram has been performed.  Christy [name] 11/23/2017, 11:29 AM

## 2017-11-23 NOTE — Telephone Encounter (Signed)
Sent in medication

## 2017-11-28 ENCOUNTER — Ambulatory Visit: Payer: Self-pay

## 2017-11-28 DIAGNOSIS — R079 Chest pain, unspecified: Secondary | ICD-10-CM

## 2017-11-28 DIAGNOSIS — I10 Essential (primary) hypertension: Secondary | ICD-10-CM

## 2017-11-28 NOTE — Chronic Care Management (AMB) (Signed)
  Care Management   Follow Up Note   11/28/2017 Name: Christy [name] MRN: [id number] DOB: 17-May-1955  Referred by: Maple Hudson., MD Reason for referral : Chronic Care Management (post cardiology appointment follow up)   Subjective: "I'm doing good today"   Assessment: Christy [name] was referred to the CCM program by her primary care provider to address DM management. The CCM Team has assisted Christy [name] with establishing a plan of care related to DM self care and has most recently assisted Christy [name] with coordination of care with Cardiology related to a new diagnosis of syncope.   Goals Addressed    . "I don't have anybody who can answer for me if I get sick" (pt-stated)   On track    Christy [name] states she would like for her cousin Christy [name] to make all decisions related to her healthcare in the event that she (Christy [name]) were unable to make those decision for herself. Christy [name] requested that CCM Clinic RN CM contact Christy [name] [phone]) to discuss patients wishes. Christy [name] states she and one other family member currently keep in close contact with Christy [name]. Christy [name] assist Christy [name] with her finances when needed.   Clinical Goals: Over the next 14 days, patient will verbalize understanding of plans for advanced directives  Interventions: Per patient request, contacted Christy [name] and discussed patients wishes for her to be HCPOA. Provided Advanced Directives packet to [employer] via [employer].     [name] "I want to get better control of my sugar" (pt-stated)   On track    Christy [name] states she is doing well managing her diabetes. She is taking all medications as prescribed (pillpack). She reports most recent blood sugars as 129 and 132. She denies symptoms of hypo or hyperglycemia.  Clinical Goals: Patient will continue to follow plan of care related to Diabetes-Self Care management   Interventions: review of cbg's;  Reinforced education re: importance of regularly scheduled meals, medication  compliance, and checking CBGs as ordered.      [name] "I want to get help since I passed out" (pt-stated)   On track    Per EMR review and patient statement, Christy [name] attended Cardiology appointment and follow up testing as directed. She has not been given test results nor has she been give a 1 month follow up appointment as indicated on her cardiology appointment AVS.  Clinical Goal(s): Patient will attend scheduled outpatient cardiology appointment on 12/07/17 as evidenced by notation in medical record.   Interventions: Telephone encounter to Cardiology office to secure 1 month follow up appointment for patient to discuss recent test results. Confirmed appointment time and date with patient and reminded patient to secure transportation from [facility] office.       Plan: Will follow up with patient via telephone in two weeks  Johncarlo Maalouf E. Suzie Portela, RN, BSN Nurse Care Coordinator [facility] Care Management [phone]

## 2017-11-28 NOTE — Patient Instructions (Addendum)
1. Continue to check your blood sugars daily and follow the diabetes plan of care discussed with your Care Management Team 2. Follow up with Dr. [name] (Cardiologist) on Wednesday, November 13 at 2:00pm 3. Please call for Medicaid transportation to your Cardiology appointment 4. Advanced Directive forms have been mailed to [employer] as discussed 5. Please contact your Care Management Team as needed for additional needs  Goals Addressed    . "I don't have anybody who can answer for me if I get sick" (pt-stated)   On track      Clinical Goals: Over the next 14 days, patient will verbalize understanding of plans for advanced directives  Interventions: Per patient request, contacted [name] and discussed patients wishes for her to be HCPOA. Provided Advanced Directives packet to [employer] via [employer].     [name] "I want to get better control of my sugar" (pt-stated)   On track      Clinical Goals: Patient will continue to follow plan of care related to Diabetes-Self Care management   Interventions: review of cbg's;  Reinforced education re: importance of regularly scheduled meals, medication compliance, and checking CBGs as ordered.      [name] "I want to get help since I passed out" (pt-stated)   On track      Clinical Goal(s): Patient will attend scheduled outpatient cardiology appointment on 12/07/17 as evidenced by notation in medical record.   Interventions: Telephone encounter to Cardiology office to secure 1 month follow up appointment for patient to discuss recent test results. Confirmed appointment time and date with patient and reminded patient to secure transportation from [facility] office.      CCM (Chronic Care Management) Team   Yvone Neu RN, BSN Nurse Care Coordinator  [phone]  [name] PharmD  Clinical Pharmacist  [phone]

## 2017-12-01 ENCOUNTER — Other Ambulatory Visit: Payer: Self-pay | Admitting: Family Medicine

## 2017-12-01 MED ORDER — POTASSIUM CHLORIDE CRYS ER 20 MEQ PO TBCR: 20.0000 meq | EXTENDED_RELEASE_TABLET | Freq: Every day | ORAL | 1 refills | Status: DC

## 2017-12-01 NOTE — Telephone Encounter (Signed)
[facility] faxed a request for a the following medication. Thanks CC  potassium chloride SA (K-DUR,KLOR-CON) 20 MEQ tablet

## 2017-12-06 ENCOUNTER — Ambulatory Visit: Payer: Self-pay

## 2017-12-06 NOTE — Chronic Care Management (AMB) (Signed)
  Care Management   Note  12/06/2017 Name: Christy [name] MRN: [id number] DOB: 05-05-1955   RN CM received an incoming call from Ms. Rosalene Billingsngela [name], cousin to Ms. Pesnell and person Ms. Babineau desires to be her HCPOA. HIPAA identifiers verified and verbal consent to speak to [name] previously received. CCM RN CM previously discussed Ms. Dorena BodoGant's wishes with [name] and mailed Advanced Directives forms to Ms. [name]'s home. [name] has received Advanced Directives packet via mail and calls today to have questions regarding filling out the form answered. I reviewed the Advanced Directives packet with [name] and answered all questions. Per Ms. Everardo AllEllison, she plans to visit patient over the next two weeks to assist with completion of Advanced Directives documents.  Plan: The CCM Team will follow up with [name] next week as previously scheduled.  Tawonna Esquer E. Suzie PortelaPayne, RN, BSN Nurse Care Coordinator [facility] Care Management [phone]

## 2017-12-12 ENCOUNTER — Ambulatory Visit: Payer: Medicaid Other

## 2017-12-12 DIAGNOSIS — I10 Essential (primary) hypertension: Secondary | ICD-10-CM

## 2017-12-12 DIAGNOSIS — E1140 Type 2 diabetes mellitus with diabetic neuropathy, unspecified: Secondary | ICD-10-CM

## 2017-12-12 DIAGNOSIS — N183 Chronic kidney disease, stage 3 unspecified: Secondary | ICD-10-CM

## 2017-12-12 NOTE — Chronic Care Management (AMB) (Signed)
Care Management   Note  12/12/2017 Name: Christy [name] MRN: [id number] DOB: 24-Apr-1955  Referred by: Maple Hudson., MD Reason for referral : Chronic Care Management (DM, cardiology appointment follow up)   Subjective: "I'm sleepy and thirsty today"   Objective: Medications Reviewed Today    Reviewed by Andee Poles, [facility] (Pharmacist) on 10/03/17 at 1357  Med List Status: <None>  Medication Order Taking? Sig Documenting Provider Last Dose Status Informant  ACCU-CHEK SOFTCLIX LANCETS lancets [id number] Yes Check sugar once daily  DX E11.9 Maple Hudson., MD Taking Active   acetaminophen (TYLENOL) 500 MG tablet [id number] No Two tablets 3 x day for knee pain  Patient not taking:  Reported on 10/03/2017   Anola Gurney, PA Not Taking Active   albuterol (PROVENTIL HFA;VENTOLIN HFA) 108 (90 Base) MCG/ACT inhaler [id number] No Inhale 1-2 puffs into the lungs every 6 (six) hours as needed for wheezing or shortness of breath.  Patient not taking:  Reported on 10/03/2017   Maple Hudson., MD Not Taking Active   Amantadine HCl 100 MG tablet [id number] Yes Take 1 tablet (100 mg total) by mouth 2 (two) times daily. Maple Hudson., MD Taking Active   aspirin EC 81 MG tablet [id number] Yes Take 81 mg by mouth daily. [provider] Taking Active Self  benztropine (COGENTIN) 0.5 MG tablet [id number] No Take 0.5 mg by mouth 2 (two) times daily. One tab in the morning and 2 tabs at bedtime [provider] Not Taking Active   dicyclomine (BENTYL) 10 MG capsule [id number] Yes Take 1 capsule (10 mg total) by mouth 2 (two) times daily. Maple Hudson., MD Taking Active   docusate sodium (COLACE) 100 MG capsule [id number] Yes Take 1 capsule (100 mg total) by mouth daily.  Patient taking differently:  Take 200 mg by mouth daily.    Maple Hudson., MD Taking Active   fluPHENAZine (PROLIXIN) 2.5 MG/ML injection [id number] Yes Inject into the muscle.  [provider] Taking Active   fluticasone (FLONASE) 50 MCG/ACT nasal spray [id number] Yes Place 2 sprays into both nostrils daily.  Patient taking differently:  Place 2 sprays into both nostrils daily as needed for allergies.    Maple Hudson., MD Taking Active   furosemide (LASIX) 40 MG tablet [id number] Yes Take 1 tablet (40 mg total) by mouth 2 (two) times daily. Maple Hudson., MD Taking Active   glimepiride [facility]) 4 MG tablet [id number] Yes Two tablets daily with breakfast Anola Gurney, [place] Taking Active   glucose blood (ACCU-CHEK AVIVA) test strip [id number] Yes Check sugar once daily DX E11.9 Maple Hudson., MD Taking Active   ibuprofen (ADVIL,MOTRIN) 600 MG tablet [id number] No Take 1 tablet (600 mg total) by mouth every 8 (eight) hours as needed.  Patient not taking:  Reported on 06/07/2017   Maple Hudson., MD Not Taking Active   Lancet Devices [facility]) lancets [id number] Yes Check sugar once daily, DX E11.9 Maple Hudson., MD Taking Active   loratadine (CLARITIN) 10 MG tablet [id number] Yes Take 1 tablet (10 mg total) by mouth daily. Maple Hudson., MD Taking Active   meloxicam [facility]) 15 MG tablet [id number] Yes Take 15 mg by mouth daily. [provider] Taking Active   metFORMIN (GLUCOPHAGE) 1000 MG tablet [id number] Yes Take 1 tablet (1,000 mg total) by mouth 2 (two) times daily with a meal. Anola Gurney, PA Taking  Active   miconazole (MICOTIN) 2 % cream [id number] No Apply 1 application topically 2 (two) times daily.  Patient not taking:  Reported on 06/07/2017   Anola Gurneyhauvin, Robert, [place] Not Taking Active   ondansetron (ZOFRAN) 4 MG tablet [id number] No Take 1 tablet (4 mg total) by mouth every 8 (eight) hours as needed for nausea or vomiting.  Patient not taking:  Reported on 06/07/2017   Maple HudsonGilbert, Richard L Jr., MD Not Taking Active   pantoprazole (PROTONIX) 40 MG tablet [id number] Yes Take 40 mg by mouth daily.  [provider] Taking Active   potassium chloride SA (K-DUR,KLOR-CON) 20 MEQ tablet [id number] Yes Take 1 tablet (20 mEq total) by mouth daily. Maple HudsonGilbert, Richard L Jr., MD Taking Active   ranitidine (ZANTAC) 150 MG tablet [id number] Yes Take 1 tablet (150 mg total) by mouth 2 (two) times daily. Maple HudsonGilbert, Richard L Jr., MD Taking Active   simvastatin (ZOCOR) 10 MG tablet [id number] Yes Take 1 tablet (10 mg total) by mouth daily. Maple HudsonGilbert, Richard L Jr., MD Taking Active   terconazole (TERAZOL 3) 0.8 % vaginal cream [id number] No Place 1 applicator vaginally at bedtime.  Patient not taking:  Reported on 01/27/2017   Maple HudsonGilbert, Richard L Jr., MD Not Taking Active   triamterene-hydrochlorothiazide [facility](MAXZIDE-25) 37.5-25 MG tablet [id number] Yes Take 1 tablet by mouth daily. Maple HudsonGilbert, Richard L Jr., MD Taking Active   valACYclovir (VALTREX) 1000 MG tablet [id number] Yes Take 1 tablet (1,000 mg total) by mouth daily. Anola Gurneyhauvin, Robert, [place] Taking Active         Discontinued 10/03/17 1357 (Change in therapy)            Assessment: Per patient's Charisse KlinefelterHCPOA Christy [name], Christy [name] is not taking her medications regularly. She will remove them from bubble packing but will hide the pills throughout her appointment. This is difficult to assess as her fill history is 100% accurate and her weekly bubble packs have been accurate at every face to face appointment.   Today when asked specifically, Christy [name] states that she does not like some of her pills because of the size and bitter tastes. Christy [name] states that sometimes it makes her gag or throw up sometimes.   Goals Addressed            This Visit's Progress   . COMPLETED: "I don't have anybody who can answer for me if I get sick" (pt-stated)         Clinical Goals: Over the next 14 days, patient will verbalize understanding of plans for advanced directives  Interventions: Per patient request, contacted Christy Landngela and discussed patients wishes for her to be HCPOA. Provided  Advanced Directives packet to [employer] Ellis via [employer] mail.     [name]. "I want to get better control of my sugar" (pt-stated)         Clinical Goals: Patient will continue to follow plan of care related to Diabetes-Self Care management   Interventions: review of cbg's;  Reinforced education re: importance of regularly scheduled lower sugar meals, medication compliance, and checking CBGs as ordered.      [name]. "I want to get help since I passed out" (pt-stated)         Clinical Goal(s): Patient will attend scheduled outpatient cardiology appointment on 12/15/17 at 10:30 as evidenced by notation in medical record.   Interventions: Telephone encounter to Cardiology office to secure 1 month follow up appointment for patient to discuss recent test results. Confirmed appointment time and date with patient and reminded patient to  secure transportation from [facility] office.      . Patient Stated   On track    "I am doing good taking my medicines"       Plan:  I will review any alternatives that Christy [name] may find palatable and share with Dr. Sullivan Lone. I will also share caregiver's adherence concerns with Dr. Sullivan Lone. Christy [name] does have a daily CNA and it may be necessary for the CNA to observe Christy [name] taking her morning medications.    Christy [name], PharmD Clinical Pharmacist [facility] [phone]

## 2017-12-12 NOTE — Chronic Care Management (AMB) (Addendum)
Care Management   Follow Up Note   12/12/2017 Name: Christy [name] MRN: [id number] DOB: 1955-07-29  Referred by: Maple HudsonGilbert, Richard L Jr., MD Reason for referral : Chronic Care Management (DM, cardiology appointment follow up)    Subjective: "Im doing good" "Im sorry I missed my appointment with you last week"   Objective:   Assessment: [name] was referred to the CCM program by her primary care provider to address DM management. The CCM Team has assisted Ms. Denardo with establishing a plan of care related to DM self care and has most recently assisted Ms. Cotterman with coordination of care with Cardiology related to a new diagnosis of syncope. The CCM team follow up with [name] today over the phone to see how she was doing and for ongoing assessment/assistance with care management.  Goals Addressed            This Visit's Progress   . COMPLETED: "I don't have anybody who can answer for me if I get sick" (pt-stated)       [name] has completed with assistance her Advanced Directives. A copy of the documents was delivered to the office today by her cousin/HCPOA Christy [name]. The documents have been scanned into the EMR and available for viewing.  Clinical Goals: Over the next 14 days, patient will verbalize understanding of plans for advanced directives  Interventions: Per patient request, contacted Christy [name] and discussed patients wishes for her to be HCPOA. Provided Advanced Directives packet to [employer] Ellis via [employer] mail.     [name]. "I want to get better control of my sugar" (pt-stated)       [name] states she is taking her medications as directed and following an ADA diet to the best of her ability. However, after speaking with her HCPOA, it sounds as if Christy [name] is missing doses of her medication, eating cookies and drinking sodas when she is able to buy them, and not eating but one or two meals a day related to her sleep patterns. Today when I spoke with [name] (3:45) she had not  checked her sugar today. She is complaining of being more tired and extreme thirst. While on the phone with CCM Team, [name] checked her sugar and reported 254 as her blood glucose. She states she has not eaten since her initial meal of the day at 12:00 (2 banana sandwiches). [name] denied blurred vision or altered mentation. She was instructed to drink water to decrease blood glucose and walk in her home as tolerated.  Clinical Goals: Patient will continue to follow plan of care related to Diabetes-Self Care management   Interventions: review of cbg's;  Reinforced education re: importance of regularly scheduled lower sugar meals, medication compliance, and checking CBGs as ordered. Collaborated with HCPOA regarding solutions for medication adherence and alternative care vs independent living.      [name]. "I want to get help since I passed out" (pt-stated)       [name] missed her cardiology follow up appointment last week 12/07/17. She failed to schedule transportation and actually thought her appointment was to meet with this CCM RN CM.   Clinical Goal(s): Patient will attend scheduled outpatient cardiology appointment on 12/15/17 at 10:30 as evidenced by notation in medical record.   Interventions: Telephone encounter to Cardiology office to secure 1 month follow up appointment for patient to discuss recent test results. Confirmed appointment time and date with patient and HCPOA and scheduled transportation for patient  with [employer]. Notified patient and HCPOA of pick up time for transportation.        Plan: The CCM Team will follow up with Christy [name] next week   Lashawn Bromwell E. Suzie Portela, RN, BSN Nurse Care Coordinator [facility] Care Management [phone]

## 2017-12-12 NOTE — Patient Instructions (Addendum)
1. Continue checking your blood sugar daily. 2. Only drink water for the rest of today to bring blood sugar down.  3. Try to walk from room to room or do some sitting arm exercises today while your blood sugar is high. These exercises will help lower your blood sugar.  4. Keep up the good work taking your medications! If you feel like you are having a side effect to a certain medication or can't swallow a medication, call Karalee HeightJulie Hedrick, pharmacist at [facility].    Please call a member of the CCM (Chronic Care Management) Team with any questions or case management needs:   Arthur HolmsPortia Esme Freund, Rn, BSN Nurse Care Coordinator  [phone]  Karalee HeightJulie Hedrick, PharmD  Clinical Pharmacist  [phone]) [phone]

## 2017-12-26 ENCOUNTER — Ambulatory Visit: Payer: Self-pay | Admitting: Family Medicine

## 2017-12-27 ENCOUNTER — Ambulatory Visit: Payer: Self-pay

## 2017-12-27 DIAGNOSIS — I10 Essential (primary) hypertension: Secondary | ICD-10-CM

## 2017-12-27 DIAGNOSIS — E1140 Type 2 diabetes mellitus with diabetic neuropathy, unspecified: Secondary | ICD-10-CM

## 2017-12-27 NOTE — Patient Instructions (Addendum)
1. Continue checking your blood sugar daily. 2. Try to walk for 10 min each day if the weather permits. Exercise will help you better control your sugars.  3. Please take all your medications as prescribed each day.   Please call a member of the CCM (Chronic Care Management) Team with any questions or case management needs:   Arthur HolmsPortia Remington Skalsky, Rn, BSN Nurse Care Coordinator  [phone]  Karalee HeightJulie Hedrick, PharmD  Clinical Pharmacist  [phone]

## 2017-12-27 NOTE — Chronic Care Management (AMB) (Signed)
Chronic Care Management   Note  12/27/2017 Name: Christy [name] MRN: [id number] DOB: 1955/09/01   Subjective: "Tell Christy [name] I'm sorry I missed my appointment"  Objective:  Medications Reviewed Today    Reviewed by Christy [name], Christy [name], [facility] (Pharmacist) on 10/03/17 at 1357  Med List Status: <None>  Medication Order Taking? Sig Documenting Provider Last Dose Status Informant  ACCU-CHEK SOFTCLIX LANCETS lancets [id number] Yes Check sugar once daily  DX E11.9 Christy [name] Taking [name]   acetaminophen (TYLENOL) 500 MG tablet [id number] No Two tablets 3 x day for knee pain  Patient not taking:  Reported on 10/03/2017   Christy [name] Not Taking [name]   albuterol (PROVENTIL HFA;VENTOLIN HFA) 108 (90 Base) MCG/ACT inhaler [id number] No Inhale 1-2 puffs into the lungs every 6 (six) hours as needed for wheezing or shortness of breath.  Patient not taking:  Reported on 10/03/2017   Christy [name] Not Taking [name]   Amantadine HCl 100 MG tablet [id number] Yes Take 1 tablet (100 mg total) by mouth 2 (two) times daily. Christy [name] Taking [name]   aspirin EC 81 MG tablet [id number] Yes Take 81 mg by mouth daily. Provider, Historical, [name] Taking [name] Self  benztropine (COGENTIN) 0.5 MG tablet [id number] No Take 0.5 mg by mouth 2 (two) times daily. One tab in the morning and 2 tabs at bedtime Provider, Historical, [name] Not Taking [name]   dicyclomine (BENTYL) 10 MG capsule [id number] Yes Take 1 capsule (10 mg total) by mouth 2 (two) times daily. Christy [name] Taking [name]   docusate sodium (COLACE) 100 MG capsule [id number] Yes Take 1 capsule (100 mg total) by mouth daily.  Patient taking differently:  Take 200 mg by mouth daily.    Christy [name] Taking [name]   fluPHENAZine (PROLIXIN) 2.5 MG/ML injection [id number] Yes Inject into the muscle. Provider, Historical, [name] Taking [name]   fluticasone (FLONASE) 50 MCG/ACT nasal spray [id number] Yes  Place 2 sprays into both nostrils daily.  Patient taking differently:  Place 2 sprays into both nostrils daily as needed for allergies.    Christy [name] Taking [name]   furosemide (LASIX) 40 MG tablet [id number] Yes Take 1 tablet (40 mg total) by mouth 2 (two) times daily. Christy [name] Taking [name]   glimepiride [facility](AMARYL) 4 MG tablet [id number] Yes Two tablets daily with breakfast Christy [name], Christy [name] Taking [name]   glucose blood (ACCU-CHEK AVIVA) test strip [id number] Yes Check sugar once daily DX E11.9 Christy [name] Taking [name]   ibuprofen (ADVIL,MOTRIN) 600 MG tablet [id number] No Take 1 tablet (600 mg total) by mouth every 8 (eight) hours as needed.  Patient not taking:  Reported on 06/07/2017   Christy [name] Not Taking [name]   Lancet [facility](ACCU-CHEK [facility]) lancets [id number] Yes Check sugar once daily, DX E11.9 Christy [name] Taking [name]   loratadine (CLARITIN) 10 MG tablet [id number] Yes Take 1 tablet (10 mg total) by mouth daily. Christy [name] Taking [name]   meloxicam [facility](MOBIC) 15 MG tablet [id number] Yes Take 15 mg by mouth daily. Provider, Historical, [name] Taking [name]   metFORMIN (GLUCOPHAGE) 1000 MG tablet [id number] Yes Take 1 tablet (1,000 mg total) by mouth 2 (two) times daily with a meal. Christy [name] Taking [name]   miconazole (MICOTIN) 2 % cream [id number] No Apply 1 application topically 2 (  two) times daily.  Patient not taking:  Reported on 06/07/2017   Christy [name] Not Taking [name]   ondansetron (ZOFRAN) 4 MG tablet [id number] No Take 1 tablet (4 mg total) by mouth every 8 (eight) hours as needed for nausea or vomiting.  Patient not taking:  Reported on 06/07/2017   Christy Hudson., [name] Not Taking [name]   pantoprazole (PROTONIX) 40 MG tablet [id number] Yes Take 40 mg by mouth daily. Provider, Historical, [name] Taking [name]   potassium chloride SA (K-DUR,KLOR-CON) 20 MEQ tablet  [id number] Yes Take 1 tablet (20 mEq total) by mouth daily. Christy Hudson., [name] Taking [name]   ranitidine (ZANTAC) 150 MG tablet [id number] Yes Take 1 tablet (150 mg total) by mouth 2 (two) times daily. Christy Hudson., [name] Taking [name]   simvastatin (ZOCOR) 10 MG tablet [id number] Yes Take 1 tablet (10 mg total) by mouth daily. Christy Hudson., [name] Taking [name]   terconazole (TERAZOL 3) 0.8 % vaginal cream [id number] No Place 1 applicator vaginally at bedtime.  Patient not taking:  Reported on 01/27/2017   Christy Hudson., [name] Not Taking [name]   triamterene-hydrochlorothiazide [facility]) 37.5-25 MG tablet [id number] Yes Take 1 tablet by mouth daily. Christy Hudson., [name] Taking [name]   valACYclovir (VALTREX) 1000 MG tablet [id number] Yes Take 1 tablet (1,000 mg total) by mouth daily. Christy [name] Taking [name]         Discontinued 10/03/17 1357 (Change in therapy)            Assessment:   Discussion with Christy [name], patient's HCPOA, regarding Christy [name]'s medication management. Christy [name] that despite Christy [name] showing perfect compliance packs at office visits, often times when she visits Christy [name] at home, Christy [name] has NOT taken her medications or Christy [name] will find loose pills scattered about Christy [name]'s home.   I spoke with Christy [name] about her medications- if she didn't like taking them or sometimes forgot. Christy [name] states that some pills she doesn't like taking because they make her gag.   Christy [name] did DC potassium chloride 12/16/17. I removed this from her medication list. Potassium chloride tablets are very large and can be abrasive, so hopefully this will slightly improve Seham's adherence.   I hope to transition Eurydice to a once daily regimen for her medications to increase compliance and adherence, based on concerns raised by Christy [name].   Goals Addressed            This Visit's Progress   . "I want to get better control of my sugar" (pt-stated)   Not  on track      Clinical Goals: Patient will continue to follow plan of care related to Diabetes-Self Care management   Interventions: review of cbg's;  Reinforced education re: importance of regularly scheduled lower sugar meals, medication compliance, and checking CBGs as ordered.      . COMPLETED: "I want to get help since I passed out" (pt-stated)         Clinical Goal(s): Patient will attend scheduled outpatient cardiology appointment on 12/15/17 at 10:30 as evidenced by notation in medical record.   Interventions: Telephone encounter to Cardiology office to secure 1 month follow up appointment for patient to discuss recent test results. Confirmed appointment time and date with patient and reminded patient to secure transportation from [facility] office.      . COMPLETED: Patient Stated   On track    "I am doing good taking my medicines"  Time:  Time spent counseling patient: 10 Additional time spent on charting: 5  Plan: Recommendations discussed with provider  Will review medication list and assist Dr. Sullivan Lone in transitioning Ms. Barasch to once daily medication regimen  Follow up with [name] at her office visit with Dr. Sullivan Lone next week. Review medications in depth.  [name], PharmD Clinical Pharmacist [facility] [phone]

## 2017-12-27 NOTE — Chronic Care Management (AMB) (Signed)
  Chronic Care Management   Follow Up Note   12/27/2017 Name: Matilde BashMinnie B Lannen MRN: [id number] DOB: 1955-03-10  Referred by: Maple HudsonGilbert, Richard L Jr., MD Reason for referral : Chronic Care Management (DM/Cardiology follow up)    Subjective: "I haven't checked my sugars today"   Objective:  Lab Results  Component Value Date   HGBA1C 9.1 (A) 10/03/2017     Assessment:  [name] was referred to the CCM program by her primary care provider to address DM management. The CCM Team has assisted Ms. Neely with establishing a plan of care related to DM self care and has most recently assisted Ms. Maclay with coordination of care with Cardiology related to a new diagnosis of syncope.The CCM team follow up with [name] today over the phone to see how she was doing and for ongoing assessment/assistance with care management.   Goals Addressed    . "I want to get better control of my sugar" (pt-stated)   Not on track    [name] is having a difficult time monitoring her blood sugars on a regular basis. She admits to not checking her blood sugar today or yesterday. She denies symptoms of hyper or hypoglycemia. She states she is taking her medications as prescribed but according to her family/HCPOA Rosalene BillingsAngela [name], [name] is not compliant with her medications and may miss 3 days of medications consecutively. Per Marylene LandAngela if Ms. Javier continues to be non-compliant with her medications she may need to seek alternative care for patient. Marylene Landngela is going to contact Ms. Meinzer's medicaid worker to make sure she assist Ms. Luck with her morning medications on a daily basis to ensure compliance. She will also discuss with worker the need for Ms. Jain to take her blood sugars regularly.  [name] recently missed follow up appointment to PCP. [name] states she was unaware of such appointment. CCM RN CM will facilitate new appointment and transportation acquisition per Ms. Dorena BodoGant's request. Will also notify Rosalene Billingsngela [name] of  new appointment time and date once acquired.  Clinical Goals: Patient will continue to follow plan of care related to Diabetes-Self Care management   Interventions: review of cbg's;  Reinforced education re: importance of regularly scheduled lower sugar meals, medication compliance, and checking CBGs as ordered. Collaborated with POA for additional support within Ms. Domagala's home for medication compliance. Will make PCP aware of POA concerns via this note.      . COMPLETED: "I want to get help since I passed out" (pt-stated)       [name] completed her recent follow up visit with her Cardiologist Dr. Juliann Paresallwood as scheduled. Per patient her test results were negative. She does not have to return to a follow up appointment for 6 months.   Clinical Goal(s): Patient will attend scheduled outpatient cardiology appointment on 12/15/17 at 10:30 as evidenced by notation in medical record.   Interventions: Telephone encounter to Cardiology office to secure 1 month follow up appointment for patient to discuss recent test results. Confirmed appointment time and date with patient and reminded [facility]      Plan: Will follow up with [name] in 2 weeks or at her next appointment with PCP  Jaythan Hinely E. Suzie PortelaPayne, RN, BSN Nurse Care Coordinator [facility] [phone]

## 2017-12-29 ENCOUNTER — Ambulatory Visit: Payer: Self-pay

## 2017-12-29 DIAGNOSIS — E1140 Type 2 diabetes mellitus with diabetic neuropathy, unspecified: Secondary | ICD-10-CM

## 2017-12-29 DIAGNOSIS — I10 Essential (primary) hypertension: Secondary | ICD-10-CM

## 2017-12-29 NOTE — Chronic Care Management (AMB) (Signed)
  Care Management   Follow Up Note   12/29/2017 Name: Christy [name] MRN: [id number] DOB: 06-16-1955  Referred by: Maple HudsonGilbert, Richard L Jr., MD Reason for referral : Chronic Care Management (appointment/transportation reminder)    Subjective:  "Christy [name] wrote down my appointment time with Dr. Sherrie MustacheFisher" "Christy [name] made sure I took my medications this morning"   Objective:   Assessment: Christy [name] was referred to the CCM program by her primary care provider to address DM management. The CCM Team has assisted Christy [name] with establishing a plan of care related to DM self care and has most recently assisted Christy [name] with coordination of care with Cardiology related to a new diagnosis of syncope.I followed up with Christy [name] today to make sure she remembered to write down her appointment with Dr. Sherrie MustacheFisher on 12/19 at 2:20 and to let Christy [name] know CCM RN CM had scheduled transportation for this appointment with Medicaid.  Goals Addressed    . "I want to get better control of my sugar" (pt-stated)       Christy [name] has not checked her sugar today but states her sugar yesterday was 140s. She understands the importance of her caregiver reminding her to take her medications and to take her blood sugar. Christy [name] did confirm during this conversation that her potassium pill had been removed from her pill pack as discontinued by Dr. Juliann Paresallwood.   Clinical Goals: Over the next 30 days patient will continue to follow plan of care related to Diabetes-Self Care management   Interventions: review of cbg's;  Reinforced education re: importance of regularly scheduled lower sugar meals, medication compliance, and checking CBGs as ordered. Encouraged "Christy [name]" patients Medicaid caregiver to remind patient to check her blood sugar in the mornings and to take her medications when she first gets up.       Plan: The CCM Team will follow up with patient during her next office visit with PCP 01/12/18   Mahdiya Mossberg E. Suzie PortelaPayne,  RN, BSN Nurse Care Coordinator [facility] [phone]

## 2017-12-29 NOTE — Patient Instructions (Signed)
1. Take your medicines every day 2. Check your blood sugars every morning and write them down 3. Please be read for your transportation to Dr. Wonda OldsGilberts appointment on 12/19 by 1:00pm 4. Contact you CCM Team as needed  Goals Addressed            This Visit's Progress   . "I want to get better control of my sugar" (pt-stated)         Clinical Goals: Over the next 30 days patient will continue to follow plan of care related to Diabetes-Self Care management   Interventions: review of cbg's;  Reinforced education re: importance of regularly scheduled lower sugar meals, medication compliance, and checking CBGs as ordered. Encouraged "[name]" patients Medicaid caregiver to remind patient to check her blood sugar in the mornings and to take her medications when she first gets up.    CCM (Chronic Care Management) Team   Yvone NeuPortia Yliana Gravois RN, BSN Nurse Care Coordinator  [phone]  Karalee HeightJulie Hedrick PharmD  Clinical Pharmacist  [phone]

## 2018-01-03 ENCOUNTER — Ambulatory Visit: Payer: Self-pay | Admitting: Family Medicine

## 2018-01-04 ENCOUNTER — Ambulatory Visit: Payer: Self-pay | Admitting: Pharmacist

## 2018-01-04 ENCOUNTER — Ambulatory Visit: Payer: Self-pay

## 2018-01-04 DIAGNOSIS — N183 Chronic kidney disease, stage 3 unspecified: Secondary | ICD-10-CM

## 2018-01-04 DIAGNOSIS — E1140 Type 2 diabetes mellitus with diabetic neuropathy, unspecified: Secondary | ICD-10-CM

## 2018-01-04 DIAGNOSIS — I10 Essential (primary) hypertension: Secondary | ICD-10-CM

## 2018-01-04 NOTE — Chronic Care Management (AMB) (Signed)
  Chronic Care Management   Note  01/04/2018 Name: Christy BashMinnie B [name] MRN: [id number] DOB: Feb 27, 1955   Care Coordination: CCM RN CM received an incoming call from [name], cousin and HCPOA to [name]. [name] states she received a call from [name] late last evening. [name] was extremely upset related to her incontinence of urine. Per [name], [name] relates her incontinence to her fluid pills. Per EMR, patient takes lasix 40mg  twice daily and triamterene-hydrochlorothiazide.  [name] is enquiring if PCP or Clinic Pharmacist Christy [name] can please review Christy [name]'s medications for possible reduction in diuretic and discuss with [name] at her upcoming PCP follow up visit 12/19 at 2:20.  Plan: Will follow up with [name] later today and inform PCP/Clinic Pharmacist of request per note  Lynore Coscia E. Suzie PortelaPayne, RN, BSN Nurse Care Coordinator [facility] [phone]

## 2018-01-05 NOTE — Chronic Care Management (AMB) (Signed)
  Care Management   Follow Up Note   01/05/2018 Name: Christy [name] MRN: [id number] DOB: 1955-04-23  Referred by: Maple HudsonGilbert, Richard L Jr., MD Reason for referral : Chronic Care Management (Medication management)   Subjective: "I don't want to take these water pills anymore"  Goals Addressed            This Visit's Progress   . "I don't want to take so many pills" (pt-stated)        Clinical Goal(s): Over the next 90 days, [name] will be adherent to all medications as evidenced by a percentage of days covered [facility] report of >80%.   Interventions:  -CCM pharmacist will investigate strategies to reduce patient's pill burden  -CCM pharmacist will discuss switching to once daily dosed medications with provider        Assessment: Outreach call to Ms. Yard following incoming [facility] from [facility] to CCM nurse case manager this morning. Patient experienced episode of incontinence last night, which she attributes to her diuretics (Furosemide 40mg  BID and HCTZ-triamterene 25-37.5mg  daily). Per chart review, when fursemide has been DC'ed in the past, patient experiences lower extremity edema.   [name] is experiencing a high pill burden and feelings of stress and overwhelm related to the number of medications she is taking. She has expressed previously to the CCM pharmacist that some of the pills she takes make her gag.    Plan: Counseled patient on the importance of adherence. Will defer to Dr. Sullivan LoneGilbert regarding discontinuation of any diuretic. Cautioned [name] that another cause could be high blood sugars and she needs to monitor what she eats and her blood sugars carefully.   Follow up in 1 week at patient's scheduled office visit with Dr. Sullivan LoneGilbert.   Karalee HeightJulie Brynne Doane, PharmD Clinical Pharmacist [facility] [phone]

## 2018-01-09 ENCOUNTER — Ambulatory Visit: Payer: Medicaid Other | Admitting: Podiatry

## 2018-01-12 ENCOUNTER — Ambulatory Visit: Payer: Self-pay

## 2018-01-12 ENCOUNTER — Ambulatory Visit (INDEPENDENT_AMBULATORY_CARE_PROVIDER_SITE_OTHER): Payer: Medicaid Other | Admitting: Family Medicine

## 2018-01-12 VITALS — BP 120/60 | HR 81 | Temp 97.7°F | Resp 14 | Wt 298.0 lb

## 2018-01-12 DIAGNOSIS — Z90710 Acquired absence of both cervix and uterus: Secondary | ICD-10-CM

## 2018-01-12 DIAGNOSIS — N183 Chronic kidney disease, stage 3 unspecified: Secondary | ICD-10-CM

## 2018-01-12 DIAGNOSIS — F200 Paranoid schizophrenia: Secondary | ICD-10-CM

## 2018-01-12 DIAGNOSIS — E119 Type 2 diabetes mellitus without complications: Secondary | ICD-10-CM

## 2018-01-12 DIAGNOSIS — Z6841 Body Mass Index (BMI) 40.0 and over, adult: Secondary | ICD-10-CM

## 2018-01-12 DIAGNOSIS — E1142 Type 2 diabetes mellitus with diabetic polyneuropathy: Secondary | ICD-10-CM

## 2018-01-12 DIAGNOSIS — I10 Essential (primary) hypertension: Secondary | ICD-10-CM | POA: Diagnosis not present

## 2018-01-12 NOTE — Addendum Note (Signed)
Addended by: Yvone NeuPAYNE, Angelina Venard E on: 01/12/2018 03:22 PM   Modules accepted: Orders

## 2018-01-12 NOTE — Progress Notes (Signed)
Christy [name]  MRN: [id number] DOB: October 07, 1955  Subjective:  HPI   The patient is a 62 year old female who presents for follow up of chronic health.  She was last seen on 09/19/17.    Diabetes-The patient states she has been checking her glucose at home and she has been getting readings that range 130's-150's.  Her last A1C was on 10/03/17 and it was down form 11.5 to 9.1.   The patient has not had her TSH or Lipids checked in more than 2 years.   She has been being followed by the chronic care management team as well as our office.  Overall nothing is changed.  She feels fairly well.  She has no complaints today.   Patient Active Problem List   Diagnosis Date Noted  . CKD (chronic kidney disease), stage III (HCC) 06/07/2017  . Controlled type 2 diabetes mellitus without complication, without long-term current use of insulin (HCC) 04/19/2016  . Status post abdominal hysterectomy 05/01/2015  . Morbid obesity with body mass index (BMI) of 45.0 to 49.9 in adult [facility](HCC) 05/01/2015  . Acute respiratory failure (HCC) 04/28/2015  . Allergic rhinitis 08/02/2014  . Arthropathia 08/02/2014  . CCF (congestive cardiac failure) (HCC) 08/02/2014  . Current tobacco use 08/02/2014  . Diabetic neuropathy (HCC) 08/02/2014  . Genital herpes 08/02/2014  . Acid reflux 08/02/2014  . BP (high blood pressure) 08/02/2014  . HLD (hyperlipidemia) 08/02/2014  . Incontinence 08/02/2014  . Neuropathy 08/02/2014  . Drug noncompliance 08/02/2014  . Adult BMI 30+ 08/02/2014  . Arthritis, degenerative 08/02/2014  . Delusional disorder (HCC) 08/02/2014  . Acute exacerbation of chronic paranoid schizophrenia (HCC) 08/02/2014  . Avitaminosis D 08/02/2014  . Can't get food down 07/16/2013    Past Medical History:  Diagnosis Date  . Arthritis   . Asthma   . Diabetes mellitus without complication [facility](HCC)    Patient takes Metformin  . Edema   . Genital herpes   . GERD (gastroesophageal reflux disease)   .  Hyperlipidemia   . Hypertension   . Malaise and fatigue   . Mental status alteration   . Schizophrenia [facility](HCC)     Social History   Socioeconomic History  . Marital status: Single    Spouse name: Not on file  . Number of children: Not on file  . Years of education: Not on file  . Highest education level: Not on file  Occupational History  . Not on file  Social Needs  . Financial resource strain: Not on file  . Food insecurity:    Worry: Not on file    Inability: Not on file  . Transportation needs:    Medical: Not on file    Non-medical: Not on file  Tobacco Use  . Smoking status: Current Some Day Smoker    Packs/day: 0.00    Types: Cigarettes  . Smokeless tobacco: Never Used  . Tobacco comment: only smokes sometimes  Substance and Sexual Activity  . Alcohol use: Yes    Comment: beer occasionally  . Drug use: No  . Sexual activity: Yes  Lifestyle  . Physical activity:    Days per week: Not on file    Minutes per session: Not on file  . Stress: Not on file  Relationships  . Social connections:    Talks on phone: Not on file    Gets together: Not on file    Attends religious service: Not on file    Active member of  club or organization: Not on file    Attends meetings of clubs or organizations: Not on file    Relationship status: Not on file  . Intimate partner violence:    Fear of current or ex partner: Not on file    Emotionally abused: Not on file    Physically abused: Not on file    Forced sexual activity: Not on file  Other Topics Concern  . Not on file  Social History Narrative  . Not on file   The patient is not able to help with the reconciliation of her medications.  Outpatient Encounter Medications as of 01/12/2018  Medication Sig Note  . ACCU-CHEK SOFTCLIX LANCETS lancets Check sugar once daily  DX E11.9   . acetaminophen (TYLENOL) 500 MG tablet Two tablets 3 x day for knee pain (Patient not taking: Reported on 10/03/2017)   . albuterol (PROVENTIL  HFA;VENTOLIN HFA) 108 (90 Base) MCG/ACT inhaler Inhale 1-2 puffs into the lungs every 6 (six) hours as needed for wheezing or shortness of breath. (Patient not taking: Reported on 10/03/2017)   . Amantadine HCl 100 MG tablet Take 1 tablet (100 mg total) by mouth 2 (two) times daily. 12/27/2017: Indication?  [name]. aspirin EC 81 MG tablet Take 81 mg by mouth daily.   . benztropine (COGENTIN) 0.5 MG tablet Take 0.5 mg by mouth 2 (two) times daily. One tab in the morning and 2 tabs at bedtime 12/27/2017: Indication?   . dicyclomine (BENTYL) 10 MG capsule Take 1 capsule (10 mg total) by mouth 2 (two) times daily.   [name]. docusate sodium (COLACE) 100 MG capsule Take 1 capsule (100 mg total) by mouth daily. (Patient taking differently: Take 200 mg by mouth daily. )   . fluPHENAZine (PROLIXIN) 2.5 MG/ML injection Inject into the muscle.   . fluticasone (FLONASE) 50 MCG/ACT nasal spray Place 2 sprays into both nostrils daily as needed for allergies.   . furosemide (LASIX) 40 MG tablet Take 1 tablet (40 mg total) by mouth 2 (two) times daily.   [name]. glimepiride (AMARYL) 4 MG tablet Two tablets daily with breakfast   . glucose blood (ACCU-CHEK AVIVA) test strip Check sugar once daily DX E11.9   . ibuprofen (ADVIL,MOTRIN) 600 MG tablet Take 1 tablet (600 mg total) by mouth every 8 (eight) hours as needed. (Patient not taking: Reported on 06/07/2017)   . Lancet Devices (ACCU-CHEK SOFTCLIX) lancets Check sugar once daily, DX E11.9   . loratadine (CLARITIN) 10 MG tablet Take 1 tablet (10 mg total) by mouth daily.   . meloxicam (MOBIC) 15 MG tablet Take 15 mg by mouth daily.   . metFORMIN (GLUCOPHAGE) 1000 MG tablet Take 1 tablet (1,000 mg total) by mouth 2 (two) times daily with a meal.   . metoprolol succinate (TOPROL-XL) 25 MG 24 hr tablet Take 0.5 tablets (12.5 mg total) by mouth daily.   . miconazole (MICOTIN) 2 % cream Apply 1 application topically 2 (two) times daily. (Patient not taking: Reported on 06/07/2017)   .  ondansetron (ZOFRAN) 4 MG tablet Take 1 tablet (4 mg total) by mouth every 8 (eight) hours as needed for nausea or vomiting. (Patient not taking: Reported on 06/07/2017)   . pantoprazole (PROTONIX) 40 MG tablet Take 40 mg by mouth daily.   . ranitidine (ZANTAC) 150 MG tablet Take 1 tablet (150 mg total) by mouth 2 (two) times daily.   . simvastatin (ZOCOR) 10 MG tablet Take 1 tablet (10 mg total) by mouth daily.   .[name]  terconazole (TERAZOL 3) 0.8 % vaginal cream Place 1 applicator vaginally at bedtime.   . triamterene-hydrochlorothiazide (MAXZIDE-25) 37.5-25 MG tablet Take 1 tablet by mouth daily.   . valACYclovir (VALTREX) 1000 MG tablet Take 1 tablet (1,000 mg total) by mouth daily.    No facility-administered encounter medications on file as of 01/12/2018.     Allergies  Allergen Reactions  . Benzoyl Peroxide   . Cephalexin Itching  . Peroxide [Hydrogen Peroxide] Swelling  . Shellfish Allergy Nausea And Vomiting    Review of Systems  Constitutional: Negative.   HENT: Negative.   Eyes: Negative.   Respiratory: Negative.   Cardiovascular: Negative.   Genitourinary: Negative.   Musculoskeletal: Positive for joint pain.  Skin: Negative.   Neurological: Positive for tremors.  Endo/Heme/Allergies: Negative.   Psychiatric/Behavioral: Negative.     Objective:  BP 120/60 (BP Location: Right Arm, Patient Position: Sitting, Cuff Size: Large)   Pulse 81   Temp 97.7 F (36.5 C) (Oral)   Resp 14   Wt 298 lb (135.2 kg)   SpO2 99%   BMI 42.76 kg/m   Physical Exam  Constitutional: She is oriented to person, place, and time and well-developed, well-nourished, and in no distress.  HENT:  Head: Normocephalic and atraumatic.  Right Ear: External ear normal.  Left Ear: External ear normal.  Nose: Nose normal.  Eyes: Pupils are equal, round, and reactive to light. Conjunctivae and EOM are normal.  Neck: Normal range of motion. Neck supple.  Cardiovascular: Normal rate, regular rhythm,  normal heart sounds and intact distal pulses.  Pulmonary/Chest: Effort normal and breath sounds normal.  Musculoskeletal: Normal range of motion.        General: Edema present. Tenderness: medical joint line left knee and petallar tendon.     Comments: 1+ lower extremity edema, chronic finding.  Neurological: She is alert and oriented to person, place, and time. She has normal reflexes. Gait normal. GCS score is 15.  Skin: Skin is warm and dry.  Psychiatric: Mood, memory and affect normal.    Assessment and Plan :  1. Essential hypertension Controlled. - TSH - Comprehensive metabolic panel - CBC with Differential/Platelet  2. Controlled type 2 diabetes mellitus without complication, without long-term current use of insulin (HCC) Improving.  Would like to see A1c less than 8. - Hemoglobin A1c  3. CKD (chronic kidney disease), stage III (HCC) Follow lab work. - Lipid Panel With LDL/HDL Ratio  4. Diabetic polyneuropathy associated with type 2 diabetes mellitus (HCC)   5. Morbid obesity with body mass index (BMI) of 45.0 to 49.9 in adult [facility]) Would like for patient to lose weight but she feels she is too thin.  6. Paranoid schizophrenia (HCC) She does well as long as she takes her medication.  CCM is helping to follow patient.  7. Status post abdominal hysterectomy   I have done the exam and reviewed the chart and it is accurate to the best of my knowledge. Dentist has been used and  any errors in dictation or transcription are unintentional. Julieanne Manson M.D. [facility]

## 2018-01-12 NOTE — Chronic Care Management (AMB) (Signed)
  Care Management   Follow Up Note   01/12/2018 Name: Christy [name] MRN: [id number] DOB: [birth year]  Referred by: Jerrol Banana., MD Reason for referral : Chronic Care Management (PCP visit/ F2F follow up DM)    Subjective: "Im doing just fine"   Objective:   Assessment: Christy [name] is a 62 year old female patient of Dr. Miguel Aschoff who was referred to the CCM program by Dr. Rosanna Randy to address DM management. The CCM Team has continued to follow and assist Christy [name] with her healthcare needs and goals. CCM Team has also collaborated with patients [name] Christy [name] to ensure patient continues to care for her self and medical needs at home. Christy [name] has engaged Christy [name] daily caregiver to assist with medication compliance and blood glucose monitoring. The CCM Team met with Christy [name] in the office today during her scheduled PCP visit for follow up.  Goals Addressed    . "I want to get better control of my sugar" (pt-stated)       Christy [name] appears to be doing well. She is happy to meet with the CCM Team again. She is checking her sugars intermittently. "If I don't do it one day I will check it the next day". Christy [name] acknowledges her sugars not being above 200 however she did not bring her meter today for review. She admits to drinking large glasses of orange juice "to wash the bitter taste of my medicines away". Christy [name] trys to drink diet soda but does purchase regular soda on occasion and "drinks the whole thing in one day". I am unsure of how much Christy [name] comprehends how to manage her DM and why it is important to do so. Christy [name] is also concerned about ability to follow plan of care related to diabetes. Christy [name] currently utilizes daily pill pack but there is concern that she continues to have a problem with compliance.  Clinical Goals: Over the next 30 days patient will continue to follow plan of care related to Diabetes-Self Care management   Interventions: review of  cbg's;  Reinforced education re: importance of regularly scheduled lower sugar meals, medication compliance, and checking CBGs as ordered. Encouraged "Christy [name]" patients Medicaid caregiver to remind patient to check her blood sugar in the mornings and to take her medications when she first gets up.  01/12/18 Reinforced importance of daily monitoring and recording. Encouraged patient to use sugar free candy to relieve "bitter taste" in mouth after taking medications. Encouraged patient to drink only sugar free soda and to focus on her water intake.      Plan: Will follow up with patient in a month   Meggan Dhaliwal E. Rollene Rotunda, RN, BSN Nurse Care Coordinator [facility] Care Management [phone]

## 2018-01-12 NOTE — Chronic Care Management (AMB) (Signed)
Erroneous Encounter

## 2018-01-13 LAB — CBC WITH DIFFERENTIAL/PLATELET
BASOS ABS: 0.1 10*3/uL (ref 0.0–0.2)
Basos: 1 %
EOS (ABSOLUTE): 0.6 10*3/uL — ABNORMAL HIGH (ref 0.0–0.4)
Eos: 6 %
HEMOGLOBIN: 12.1 g/dL (ref 11.1–15.9)
Hematocrit: 39.6 % (ref 34.0–46.6)
IMMATURE GRANS (ABS): 0 10*3/uL (ref 0.0–0.1)
Immature Granulocytes: 0 %
LYMPHS ABS: 1.6 10*3/uL (ref 0.7–3.1)
LYMPHS: 17 %
MCH: 22.2 pg — ABNORMAL LOW (ref 26.6–33.0)
MCHC: 30.6 g/dL — AB (ref 31.5–35.7)
MCV: 73 fL — ABNORMAL LOW (ref 79–97)
MONOCYTES: 9 %
Monocytes Absolute: 0.8 10*3/uL (ref 0.1–0.9)
NEUTROS PCT: 67 %
Neutrophils Absolute: 6 10*3/uL (ref 1.4–7.0)
Platelets: 236 10*3/uL (ref 150–450)
RBC: 5.45 x10E6/uL — ABNORMAL HIGH (ref 3.77–5.28)
RDW: 17 % — ABNORMAL HIGH (ref 12.3–15.4)
WBC: 9 10*3/uL (ref 3.4–10.8)

## 2018-01-13 LAB — COMPREHENSIVE METABOLIC PANEL
ALK PHOS: 81 IU/L (ref 39–117)
ALT: 22 IU/L (ref 0–32)
AST: 20 IU/L (ref 0–40)
Albumin/Globulin Ratio: 1.8 (ref 1.2–2.2)
Albumin: 4.1 g/dL (ref 3.6–4.8)
BILIRUBIN TOTAL: 0.4 mg/dL (ref 0.0–1.2)
BUN/Creatinine Ratio: 11 — ABNORMAL LOW (ref 12–28)
BUN: 8 mg/dL (ref 8–27)
CHLORIDE: 90 mmol/L — AB (ref 96–106)
CO2: 28 mmol/L (ref 20–29)
CREATININE: 0.76 mg/dL (ref 0.57–1.00)
Calcium: 9.5 mg/dL (ref 8.7–10.3)
GFR calc Af Amer: 96 mL/min/{1.73_m2} (ref >59)
GFR calc non Af Amer: 83 mL/min/{1.73_m2} (ref >59)
GLUCOSE: 190 mg/dL — AB (ref 65–99)
Globulin, Total: 2.3 g/dL (ref 1.5–4.5)
Potassium: 3.9 mmol/L (ref 3.5–5.2)
Sodium: 136 mmol/L (ref 134–144)
Total Protein: 6.4 g/dL (ref 6.0–8.5)

## 2018-01-13 LAB — HEMOGLOBIN A1C
Est. average glucose Bld gHb Est-mCnc: 200 mg/dL
Hgb A1c MFr Bld: 8.6 % — ABNORMAL HIGH (ref 4.8–5.6)

## 2018-01-13 LAB — LIPID PANEL WITH LDL/HDL RATIO
Cholesterol, Total: 186 mg/dL (ref 100–199)
HDL: 42 mg/dL (ref >39)
LDL CALC: 80 mg/dL (ref 0–99)
LDl/HDL Ratio: 1.9 ratio (ref 0.0–3.2)
Triglycerides: 318 mg/dL — ABNORMAL HIGH (ref 0–149)
VLDL CHOLESTEROL CAL: 64 mg/dL — AB (ref 5–40)

## 2018-01-13 LAB — TSH: TSH: 2.71 u[IU]/mL (ref 0.450–4.500)

## 2018-01-16 ENCOUNTER — Telehealth: Payer: Self-pay

## 2018-01-16 DIAGNOSIS — F209 Schizophrenia, unspecified: Secondary | ICD-10-CM | POA: Insufficient documentation

## 2018-01-16 NOTE — Telephone Encounter (Signed)
-----   Message from Maple Hudsonichard L Gilbert Jr., MD sent at 01/16/2018  8:51 AM EST ----- Diabetes a little better/stable.  Labs okay otherwise.  On next lab draw get iron panel

## 2018-01-16 NOTE — Telephone Encounter (Signed)
Patient has been advised. KW 

## 2018-01-19 ENCOUNTER — Other Ambulatory Visit: Payer: Self-pay | Admitting: Family Medicine

## 2018-01-19 NOTE — Telephone Encounter (Signed)
[facility] faxed refill request for the following medications:  simvastatin (ZOCOR) 10 MG tablet  Qty: 30  Last dispensed: 12/01/2017  Date written: 12/14/2016  Please advise.

## 2018-01-20 MED ORDER — SIMVASTATIN 10 MG PO TABS: 10.0000 mg | ORAL_TABLET | Freq: Every day | ORAL | 12 refills | Status: AC

## 2018-01-20 NOTE — Telephone Encounter (Signed)
Rx sent to pharmacy   

## 2018-02-02 ENCOUNTER — Ambulatory Visit: Payer: Self-pay

## 2018-02-02 ENCOUNTER — Ambulatory Visit: Payer: Self-pay | Admitting: Pharmacist

## 2018-02-02 DIAGNOSIS — E119 Type 2 diabetes mellitus without complications: Secondary | ICD-10-CM

## 2018-02-02 DIAGNOSIS — Z6841 Body Mass Index (BMI) 40.0 and over, adult: Secondary | ICD-10-CM

## 2018-02-02 DIAGNOSIS — E1142 Type 2 diabetes mellitus with diabetic polyneuropathy: Secondary | ICD-10-CM

## 2018-02-02 DIAGNOSIS — I10 Essential (primary) hypertension: Secondary | ICD-10-CM

## 2018-02-02 DIAGNOSIS — N183 Chronic kidney disease, stage 3 unspecified: Secondary | ICD-10-CM

## 2018-02-02 DIAGNOSIS — F200 Paranoid schizophrenia: Secondary | ICD-10-CM

## 2018-02-02 DIAGNOSIS — E1140 Type 2 diabetes mellitus with diabetic neuropathy, unspecified: Secondary | ICD-10-CM

## 2018-02-02 NOTE — Chronic Care Management (AMB) (Deleted)
  Care Management   Follow Up Note   02/02/2018 Name: Christy [name] MRN: [id number] DOB: August 09, 1955  Referred by: Christy [name]., MD Reason for referral : Chronic Care Management (DM follow up)    Subjective:    Objective:   Assessment: (brief patient description)  Review of patient status, including review of consultants reports, relevant laboratory and other test results, and collaboration with appropriate care team members and the patient's provider was performed as part of comprehensive patient evaluation and provision of chronic care management services.    Goals Addressed    . "I missed my last foot exam" (pt-stated)       Current Barriers: Transportation, memory, cognition  Nurse Case Manager Clinical Goal(s): Over the next 10 days, patient will report completing of her routien foot exam with [facility]  Interventions:  . Rescheduled appointment for Thursday Jan 16 at 2:15 . Arranged transportation to appointment . Notified POA Christy [name] of appointment   Please see past updates related to this goal by clicking on the "Past Updates" button in the selected goal      . "I want to get better control of my sugar" (pt-stated)         Clinical Goals: Over the next 30 days patient will continue to follow plan of care related to Diabetes-Self Care management   Interventions: review of cbg's;  Reinforced education re: importance of regularly scheduled lower sugar meals, medication compliance, and checking CBGs as ordered. Encouraged "Christy [name]" patients Medicaid caregiver to remind patient to check her blood sugar in the mornings and to take her medications when she first gets up.  01/12/18 Reinforced importance of daily monitoring and recording. Encouraged patient to use sugar free candy to relieve "bitter taste" in mouth after taking medications. Encouraged patient to drink only sugar free soda and to focus on her water intake.  02/02/17 Reviewed  glucometer readings, reset glucometer to accurately display time and date, reinforced diet, exercise, and medication adherence. Discussed smoking cessation.    Patient Self Care Activities: ex. Takes medications (independent), administers insulin (in progress), checks cbg daily (independent) . Takes medication medications independently . Checks blood sugars daily     Plan:    Signature:

## 2018-02-02 NOTE — Patient Instructions (Signed)
1. Please check your sugar twice a day, once as soon as you get up before your breakfast and once 2 hours after your supper. Please write down your numbers if you can.  2. Please bring your glucometer and medication packs to each of your visits with Dr. Sullivan LoneGilbert 3. Please use sugar free candy instead of soda or orange juice to get the "bad taste out of your mouth" 4. Take ALL your medicines in your pill pack each day. 5. Please get a new bottle of ibuprofen within the next week. Your current bottle is expired.  CCM (Chronic Care Management) Team   Yvone NeuPortia Avanna Sowder RN, BSN Nurse Care Coordinator  [phone]  Karalee HeightJulie Hedrick PharmD  Clinical Pharmacist  [phone]  Goals Addressed            This Visit's Progress   . "I missed my last foot exam" (pt-stated)       Current Barriers: Transportation, memory, cognition  Nurse Case Manager Clinical Goal(s): Over the next 10 days, patient will report completing of her routien foot exam with [facility]  Interventions:  . Rescheduled appointment for Thursday Jan 16 at 2:15 . Arranged transportation to appointment Notified POA Rosalene BillingsAngela [name] of appointment  Please see past updates related to this goal by clicking on the "Past Updates" button in the selected goal      . "I want to get better control of my sugar" (pt-stated)         Clinical Goals: Over the next 30 days patient will continue to follow plan of care related to Diabetes-Self Care management   Interventions: review of cbg's;  Reinforced education re: importance of regularly scheduled lower sugar meals, medication compliance, and checking CBGs as ordered. Encouraged "[name]" patients Medicaid caregiver to remind patient to check her blood sugar in the mornings and to take her medications when she first gets up.  01/12/18 Reinforced importance of daily monitoring and recording. Encouraged patient to use sugar free candy to relieve "bitter taste" in mouth after  taking medications. Encouraged patient to drink only sugar free soda and to focus on her water intake.   02/02/17 Reviewed glucometer readings, reset glucometer to accurately display time and date, reinforced diet, exercise, and medication adherence. Discussed smoking cessation.    Patient Self Care Activities: ex. Takes medications (independent), administers insulin (in progress), checks cbg daily (independent) . Takes medication medications independently . Checks blood sugars daily

## 2018-02-02 NOTE — Chronic Care Management (AMB) (Signed)
Care Management   Follow Up Note   02/02/2018 Name: TYKISHA AREOLA MRN: [id number] DOB: 05/26/1955  Referred by: Jerrol Banana., MD Reason for referral : Chronic Care Management (DM follow up)    Subjective: "Im doing good"   Objective:  Lab Results  Component Value Date   HGBA1C 8.6 (H) 01/12/2018     Assessment: Ms. Schendel is a 63 year old female patient of Dr. Miguel Aschoff who was referred to the CCM program by Dr. Rosanna Randy to address DM management. The CCM Team has continued to follow and assist Ms. Elenes with her healthcare needs and goals. CCM Team has also collaborated with patients POA Saunders Glance to ensure patient continues to care for her self and medical needs at home. [name] has engaged Ms. Guyett daily caregiver to assist with medication compliance and blood glucose monitoring. Today the CCM Team met with Ms. Mihalko face to face.   Review of patient status, including review of consultants reports, relevant laboratory and other test results, and collaboration with appropriate care team members and the patient's provider was performed as part of comprehensive patient evaluation and provision of chronic care management services.    Goals Addressed            This Visit's Progress   . "I missed my last foot exam" (pt-stated)       Current Barriers: Transportation, memory, cognition  Nurse Case Manager Clinical Goal(s): Over the next 10 days, patient will report completing of her routien foot exam with [place]  Interventions:  . Rescheduled appointment for Thursday Jan 16 at 2:15 . Arranged transportation to appointment Notified POA Saunders Glance of appointment  Please see past updates related to this goal by clicking on the "Past Updates" button in the selected goal      . "I want to get better control of my sugar" (pt-stated)       Ms. Dwiggins is making a significant effort to modify her dietary choices. She is now buying diet sodas and  appreciates the sugar free candy provided today. She will use the sugar free candy to remove the bitter taste of her medications instead of her usual choice, orange juice. She is making an attempt to check her blood glucose levels. She is checking her glucose levels multiple times during the week, but admits she is not checking daily. Her blood sugar today during appointment with CCM is 187. Postprandial. She occasionally misses does of her medication but today upon review of this weeks pill pack, all medications has been taken since Sunday.  Clinical Goals: Over the next 30 days patient will continue to follow plan of care related to Diabetes-Self Care management   Interventions: review of cbg's;  Reinforced education re: importance of regularly scheduled lower sugar meals, medication compliance, and checking CBGs as ordered. Encouraged "[name]" patients Medicaid caregiver to remind patient to check her blood sugar in the mornings and to take her medications when she first gets up.  01/12/18 Reinforced importance of daily monitoring and recording. Encouraged patient to use sugar free candy to relieve "bitter taste" in mouth after taking medications. Encouraged patient to drink only sugar free soda and to focus on her water intake.   02/02/17 Reviewed glucometer readings, reset glucometer to accurately display time and date, observed patient's ability to check her blood sugar correctly,  reinforced diet, exercise, and medication adherence. Discussed smoking cessation.    Patient Self Care Activities: ex. Takes medications (independent),  administers insulin (in progress), checks cbg daily (independent) . Takes medication medications independently . Checks blood sugars daily    Plan: CCM RN CM will follow up with patient in 1 month   Mahalia Dykes E. Rollene Rotunda, RN, BSN Nurse Care Coordinator [facility] Practice[facility] Care Management [phone]

## 2018-02-02 NOTE — Patient Instructions (Addendum)
1. Please check your sugar twice a day, once as soon as you get up before your breakfast and once 2 hours after your supper. Please write down your numbers if you can.  2. Please bring your glucometer and medication packs to each of your visits with Dr. Sullivan Lone 3. Please use sugar free candy instead of soda or orange juice to get the "bad taste out of your mouth" 4. Take ALL your medicines in your pill pack each day. 5. Please get a new bottle of ibuprofen within the next week. Your current bottle is expired.  Goals Addressed            This Visit's Progress   . "I missed my last foot exam" (pt-stated)       Current Barriers: Transportation, memory, cognition  Nurse Case Manager Clinical Goal(s): Over the next 10 days, patient will report completing of her routien foot exam with [facility]  Interventions:  . Rescheduled appointment for Thursday Jan 16 at 2:15 . Arranged transportation to appointment . Notified POA [name] of appointment   Please see past updates related to this goal by clicking on the "Past Updates" button in the selected goal      . "I want to get better control of my sugar" (pt-stated)         Clinical Goals: Over the next 30 days patient will continue to follow plan of care related to Diabetes-Self Care management   Interventions: review of cbg's;  Reinforced education re: importance of regularly scheduled lower sugar meals, medication compliance, and checking CBGs as ordered. Encouraged "[name]" patients Medicaid caregiver to remind patient to check her blood sugar in the mornings and to take her medications when she first gets up.  01/12/18 Reinforced importance of daily monitoring and recording. Encouraged patient to use sugar free candy to relieve "bitter taste" in mouth after taking medications. Encouraged patient to drink only sugar free soda and to focus on her water intake.  02/02/17 Reviewed glucometer readings, reset glucometer to  accurately display time and date, reinforced diet, exercise, and medication adherence. Discussed smoking cessation.    Patient Self Care Activities: ex. Takes medications (independent), administers insulin (in progress), checks cbg daily (independent) . Takes medication medications independently . Checks blood sugars daily

## 2018-02-08 ENCOUNTER — Telehealth: Payer: Self-pay

## 2018-02-09 ENCOUNTER — Encounter: Payer: Self-pay | Admitting: Podiatry

## 2018-02-09 ENCOUNTER — Ambulatory Visit (INDEPENDENT_AMBULATORY_CARE_PROVIDER_SITE_OTHER): Payer: Medicaid Other | Admitting: Podiatry

## 2018-02-09 DIAGNOSIS — M79676 Pain in unspecified toe(s): Secondary | ICD-10-CM

## 2018-02-09 DIAGNOSIS — E1142 Type 2 diabetes mellitus with diabetic polyneuropathy: Secondary | ICD-10-CM | POA: Diagnosis not present

## 2018-02-09 DIAGNOSIS — B351 Tinea unguium: Secondary | ICD-10-CM

## 2018-02-09 NOTE — Progress Notes (Signed)
Complaint:  Visit Type: Patient returns to my office for continued preventative foot care services. Complaint: Patient states" my nails have grown long and thick and become painful to walk and wear shoes" Patient has been diagnosed with DM with neuropathy. The patient presents for preventative foot care services. No changes to ROS  Podiatric Exam: Vascular: dorsalis pedis and posterior tibial pulses are absent  bilateral. Capillary return is immediate. Temperature gradient is WNL. Skin turgor WNL  Sensorium: Diminished  Semmes Weinstein monofilament test. Normal tactile sensation bilaterally. Nail Exam: Pt has thick disfigured discolored nails with subungual debris noted bilateral entire nail hallux through fifth toenails Ulcer Exam: There is no evidence of ulcer or pre-ulcerative changes or infection. Orthopedic Exam: Muscle tone and strength are WNL. No limitations in general ROM. No crepitus or effusions noted. Foot type and digits show no abnormalities. Bony prominences are unremarkable. Skin: No Porokeratosis. No infection or ulcers  Diagnosis:  Onychomycosis, , Pain in right toe, pain in left toes  Treatment & Plan Procedures and Treatment: Consent by patient was obtained for treatment procedures. The patient understood the discussion of treatment and procedures well. All questions were answered thoroughly reviewed. Debridement of mycotic and hypertrophic toenails, 1 through 5 bilateral and clearing of subungual debris. No ulceration, no infection noted.  Return Visit-Office Procedure: Patient instructed to return to the office for a follow up visit 3 months for continued evaluation and treatment.    Helane Gunther DPM

## 2018-02-22 NOTE — Chronic Care Management (AMB) (Signed)
Chronic Care Management   Note  02/22/2018 Name: Christy [name] MRN: [id number] DOB: [birth year]  CC:  Visit with Christy [name] to assess medication adherence  Subjective:  Patient is using compliance packaging provided by [facility] as an adherence strategy.   Do you ever forget to take your medication? [x] Yes  [] No   Do you ever skip doses due to side effects? [x] Yes  [] No   Do you have trouble affording your medicines? [] Yes  [x] No   Are you ever unable to pick up your medication due to transportation difficulties? [] Yes  [x] No   Do you ever stop taking your medications because you don't believe they are helping? [x] Yes  [] No     Objective: Lab Results  Component Value Date   CREATININE 0.76 01/12/2018   CREATININE 1.15 (H) 01/27/2017   CREATININE 1.02 (H) 04/30/2016    Lab Results  Component Value Date   HGBA1C 8.6 (H) 01/12/2018    Lipid Panel     Component Value Date/Time   CHOL 186 01/12/2018 1514   CHOL 235 (H) 08/30/2012 0531   TRIG 318 (H) 01/12/2018 1514   TRIG 131 08/30/2012 0531   HDL 42 01/12/2018 1514   HDL 57 08/30/2012 0531   VLDL 26 08/30/2012 0531   LDLCALC 80 01/12/2018 1514   LDLCALC 152 (H) 08/30/2012 0531    BP Readings from Last 3 Encounters:  01/12/18 120/60  10/12/17 104/78  09/19/17 128/70    Allergies  Allergen Reactions  . Benzoyl Peroxide   . Cephalexin Itching  . Peroxide [Hydrogen Peroxide] Swelling  . Shellfish Allergy Nausea And Vomiting    Medications Reviewed Today    Reviewed by Andee Poles, [facility] (Pharmacist) on 02/22/18 at 1530  Med List Status: <None>  Medication Order Taking? Sig Documenting Provider Last Dose Status Informant  ACCU-CHEK SOFTCLIX LANCETS lancets [id number] Yes Check sugar once daily  DX E11.9 Maple Hudson., MD Taking Active Self        Discontinued 02/22/18 1529 (Patient Preference)         Discontinued 02/22/18 1529 (No longer needed (for PRN medications))   Amantadine  HCl 100 MG tablet [id number] No Take 1 tablet (100 mg total) by mouth 2 (two) times daily. Maple Hudson., MD Not Taking Active Self           Med Note Burnett Sheng, Truddie Coco Feb 02, 2018  2:41 PM)    aspirin EC 81 MG tablet [id number] Yes Take 81 mg by mouth daily. [provider] Taking Active Self        Discontinued 02/22/18 1529 (Change in therapy)            Med Note (Jakyia Gaccione E   Thu Feb 02, 2018  2:41 PM)    dicyclomine (BENTYL) 10 MG capsule [id number] Yes Take 1 capsule (10 mg total) by mouth 2 (two) times daily. Maple Hudson., MD Taking Active Self  docusate sodium (COLACE) 100 MG capsule [id number] Yes Take 1 capsule (100 mg total) by mouth daily.  Patient taking differently:  Take 200 mg by mouth daily.    Maple Hudson., MD Taking Active Self  fluPHENAZine (PROLIXIN) 2.5 MG/ML injection [id number] Yes Inject into the muscle. [provider] Taking Active Self  fluticasone (FLONASE) 50 MCG/ACT nasal spray [id number] Yes Place 2 sprays into both nostrils daily as needed for allergies. Maple Hudson., MD Taking Active   furosemide (LASIX)  40 MG tablet [id number] Yes Take 1 tablet (40 mg total) by mouth 2 (two) times daily. Maple HudsonGilbert, Richard L Jr., MD Taking Active Self  glimepiride [facility](AMARYL) 4 MG tablet [id number] Yes Two tablets daily with breakfast Maple HudsonGilbert, Richard L Jr., MD Taking Active   glucose blood [facility](ACCU-CHEK AVIVA) test strip [id number] Yes Check sugar once daily DX E11.9 Maple HudsonGilbert, Richard L Jr., MD Taking Active Self        Discontinued 02/22/18 1529 (Patient Preference)   Lancet Devices [facility](ACCU-CHEK SOFTCLIX) lancets [id number] Yes Check sugar once daily, DX E11.9 Maple HudsonGilbert, Richard L Jr., MD Taking Active Self  loratadine (CLARITIN) 10 MG tablet [id number] Yes Take 1 tablet (10 mg total) by mouth daily. Maple HudsonGilbert, Richard L Jr., MD Taking Active Self  meloxicam [facility](MOBIC) 15 MG tablet [id number] Yes Take 15 mg by mouth daily. [provider] Taking Active Self  metFORMIN (GLUCOPHAGE) 1000 MG tablet [id number] Yes Take 1 tablet (1,000 mg total) by mouth 2 (two) times daily with a meal. Anola Gurneyhauvin, Robert, PA Taking Active Self  metoprolol succinate (TOPROL-XL) 25 MG 24 hr tablet [id number] Yes Take 0.5 tablets (12.5 mg total) by mouth daily. Maple HudsonGilbert, Richard L Jr., MD Taking Active         Discontinued 02/22/18 1529 (Completed Course)   ondansetron (ZOFRAN) 4 MG tablet [id number] Yes Take 1 tablet (4 mg total) by mouth every 8 (eight) hours as needed for nausea or vomiting. Maple HudsonGilbert, Richard L Jr., MD Taking Active Self           Med Note Burnett Sheng(Ritesh Opara, Truddie CocoJULIE E   Thu Feb 02, 2018  2:46 PM) Needs refill  pantoprazole (PROTONIX) 40 MG tablet [id number] Yes Take 40 mg by mouth daily. [provider] Taking Active Self        Discontinued 02/22/18 1530 (Patient Preference)   simvastatin (ZOCOR) 10 MG tablet [id number] Yes Take 1 tablet (10 mg total) by mouth daily. Maple HudsonGilbert, Richard L Jr., MD Taking Active         Discontinued 02/22/18 1530 (Completed Course)   triamterene-hydrochlorothiazide (MAXZIDE-25) 37.5-25 MG tablet [id number] Yes Take 1 tablet by mouth daily. Maple HudsonGilbert, Richard L Jr., MD Taking Active Self  trihexyphenidyl (ARTANE) 2 MG tablet [id number] Yes Take 2 mg by mouth daily. Oletta DarterAgarwal, [place], MD Taking Active   valACYclovir (VALTREX) 1000 MG tablet [id number] Yes Take 1 tablet (1,000 mg total) by mouth daily. Anola Gurneyhauvin, Robert, PA Taking Active Self           Assessment:  Adherence Review  []  Excellent (no doses missed/week)     [x]  Good (no more than 1 dose missed/week)     []  Partial (2-3 doses missed/week)     []  Poor (>3 doses missed/week)  Intervention  YES NO  Explanation   Adherence      Cannot afford medication []  []     Cannot self-administer medication appropriately [x]  []     Does not understand directions [x]  []     Prefers not to take [x]  []     Product unavailable []  []     Forgets to take []  []      Different drug needed      Condition refractory to medication [x]  []  Dr. Michae KavaAgarwal recently DC congentin and added trihexyphenidyl but Christy [name]'s tremor has gotten worse since this change   More effective medication available []  []     More cost-effective medication available []  []     Other pertinent pharmacist  counseling      Goals Addressed  This Visit's Progress   . "I don't want to take so many pills" (pt-stated)   On track     Clinical Goal(s): Over the next 90 days, Christy [name] will be adherent to all medications as evidenced by a percentage of days covered [facility]) report of >80%.   Interventions:  -CCM pharmacist will investigate strategies to reduce patient's pill burden  -CCM pharmacist will discuss switching to once daily dosed medications with provider      Christy [name] does admit to sometimes forgetting doses of her medications but overall she is good about remembering. She has signs posted in her living room "remember to take your medication". She uses adherence weekly packs from [facility] which are delivered every Wednesday.   Noted many older prescriptions that Christy [name] had saved in her home. Many were several years old or expired. Removed these items for patient safety and updated medication list.   Will provide updated medication list to all of Christy [name]'s providers.   Plan: Recommendations discussed with provider - Increase dose of trihexyphenidyl 2mg  (prescribed by Dr. Michae Kava) - Request refill for ondansetron 4mg  ODT PRN (Dr. Sullivan Lone)  - CCM pharmacist will continue to try to simplify Christy [name]'s medication regimen as much as possible   [name], PharmD Clinical Pharmacist [facility] [phone]

## 2018-03-06 ENCOUNTER — Ambulatory Visit: Payer: Self-pay | Admitting: Pharmacist

## 2018-03-06 ENCOUNTER — Ambulatory Visit: Payer: Self-pay

## 2018-03-06 DIAGNOSIS — Z6841 Body Mass Index (BMI) 40.0 and over, adult: Secondary | ICD-10-CM

## 2018-03-06 DIAGNOSIS — N183 Chronic kidney disease, stage 3 unspecified: Secondary | ICD-10-CM

## 2018-03-06 DIAGNOSIS — E1140 Type 2 diabetes mellitus with diabetic neuropathy, unspecified: Secondary | ICD-10-CM

## 2018-03-06 DIAGNOSIS — I10 Essential (primary) hypertension: Secondary | ICD-10-CM

## 2018-03-06 DIAGNOSIS — F200 Paranoid schizophrenia: Secondary | ICD-10-CM

## 2018-03-06 NOTE — Chronic Care Management (AMB) (Signed)
Care Management   Follow Up Note   03/06/2018 Name: Christy [name] MRN: [id number] DOB: February 14, 1955  Referred by: Maple Hudson., MD Reason for referral : Chronic Care Management (follow up DM)    Subjective: "I am doing fine but my knees hurt" "I haven't checked my sugar today"   Objective:  Lab Results  Component Value Date   HGBA1C 8.6 (H) 01/12/2018     Assessment: Christy [name] is a 63 year old female patient of Dr. Julieanne Manson who was referred to the CCM program by Dr. Sullivan Lone to address DM management. The CCM Team has continued to follow and assist Christy [name] with her healthcare needs and goals. CCM Team has also collaborated with patients POA Christy [name] to ensure patient continues to care for her self and medical needs at home. Christy [name] has engaged Christy [name] daily caregiver to assist with medication compliance and blood glucose monitoring. Today CCM Team followed up with Christy [name] and Christy [name] to discuss established goals and to discuss barriers.  Review of patient status, including review of consultants reports, relevant laboratory and other test results, and collaboration with appropriate care team members and the patient's provider was performed as part of comprehensive patient evaluation and provision of chronic care management services.    Goals Addressed            This Visit's Progress   . COMPLETED: "I missed my last foot exam" (pt-stated)       Current Barriers: Transportation, memory, cognition  Nurse Case Manager Clinical Goal(s): Over the next 10 days, patient will report completing of her routien foot exam with [facility]  Interventions:  . Rescheduled appointment for Thursday Jan 16 at 2:15 . Arranged transportation to appointment Notified POA Christy [name] of appointment  Please see past updates related to this goal by clicking on the "Past Updates" button in the selected goal      . "I want to get better control of my sugar"  (pt-stated)       Christy [name] admits to not taking any medications, checking her blood sugar or eating today. The time is 3:55 pm. Christy [name] states she stayed up late watching a movie. She did awaken this morning to let her Aide in but returned to bed without medications or food. Christy [name] checked her blood sugar while on the phone with the CCM Team. CBG 187. She is counseled on the importance of taking medications as prescribed and eating regular meals. Discussed with Christy [name] who feels Christy [name] may need to move to some kind of assisted living or group home setting. Will provide Christy [name] with Social Work support through [facility] for assistance with custodial care when ready.   Current Barriers:  [name] Knowledge deficit related to DM self care . Cognition and mental health . Caregiver support and independent living  Nurse Case Manager Clinical Goal(s): Over the next 30 days, patient will engage in DM self care activities such as daily glucometer checks, medication compliance, ADA diet compliance  Interventions:  . Reviewed importance of daily glucometer checks . Reviewed ADA dietery choices and assessed for compliance . Rescheduled follow up visit with PCP for 03/14/2018 and scheduled transportation as patient will miss tomorrows appointment related to failure to schedule Medicaid transportation 3 days in advanced.  Patient Self Care Activities:  . Take medications as prescribed . Exercise as tolerated . Check blood sugars independently  Please see past updates related to this goal  by clicking on the "Past Updates" button in the selected goal       Follow Up Plan: CCM Team will follow up in 30 days  Mirza Kidney E. Suzie PortelaPayne, RN, BSN Nurse Care Coordinator [facility] Care Management [phone]

## 2018-03-06 NOTE — Patient Instructions (Addendum)
  Thank you allowing the Chronic Care Management Team to be a part of your care! It was a pleasure speaking with you today!  1. Take your medicines as prescribed. Try not to miss them. 2. Check your blood sugar daily 3. Limit your regular sodas, candies, breads, and sweets 4. You can have sugar free candy and diet soda but water is best for you to drink. 5. You can take 2 tylenol for your arthritis. You can also try warm heat or ice on your knees. 6. Make sure you are not skipping meals. This will make you feel bad. 7. I have cancelled your appointment with Dr. Sullivan LoneGilbert for tomorrow and rescheduled it for Tuesday 03/14/2018 at 2:40. I have called and scheduled your transportation.   CCM (Chronic Care Management) Team   Yvone NeuPortia Sela Falk RN, BSN Nurse Care Coordinator  [phone]) [phone]  Karalee HeightJulie Hedrick PharmD  Clinical Pharmacist  [phone]) [phone]   Goals Addressed            This Visit's Progress   . COMPLETED: "I missed my last foot exam" (pt-stated)       Current Barriers: Transportation, memory, cognition  Nurse Case Manager Clinical Goal(s): Over the next 10 days, patient will report completing of her routien foot exam with [facility]  Interventions:  . Rescheduled appointment for Thursday Jan 16 at 2:15 . Arranged transportation to appointment Notified POA Rosalene BillingsAngela [name] of appointment  Please see past updates related to this goal by clicking on the "Past Updates" button in the selected goal      . "I want to get better control of my sugar" (pt-stated)        Current Barriers:  [name]. Knowledge deficit related to DM self care . Cognition and mental health . Caregiver support and independent living  Nurse Case Manager Clinical Goal(s): Over the next 30 days, patient will engage in DM self care activities such as daily glucometer checks, medication compliance, ADA diet compliance  Interventions:  . Reviewed importance of daily glucometer checks . Reviewed ADA dietery  choices and assessed for compliance .   Patient Self Care Activities:  . Take medications as prescribed . Exercise as tolerated . Check blood sugars independently  Please see past updates related to this goal by clicking on the "Past Updates" button in the selected goal     The patient verbalized understanding of instructions provided today and declined a print copy of patient instruction materials.   Telephone follow up appointment with CCM team member scheduled for: 30 days

## 2018-03-06 NOTE — Chronic Care Management (AMB) (Signed)
  Chronic Care Management   Follow Up Note   03/06/2018 Name: Christy [name] MRN: [id number] DOB: Aug 04, 1955  Referred by: Maple Hudson., MD Reason for referral : Chronic Care Management (One month follow up)   Subjective: "My knees have been hurting me"   Assessment: Christy [name] is a 62 year old female seeing Dr. Julieanne Manson for primary care. She has been engaged with the care management team since Fall 2019. Today is a one month follow up visit to assess her medication management and adherence.   #knee pain: Christy [name] knee pain, she hasn't taken any medication for it. Christy Christy [name] to take a dose of APAP 500mg  everyu 4-6 hours and try heating pad or ice on knee joints to alleviate some pain.   #medication management: Christy [name] states that she has not taken her morning medications today and "just woke up" (around 4pm). Christy [name] also [name] that she has not eaten. Christy Christy [name] to make a chicken salad sandwich and take her morning medications and skip her evening medications just for today. Christy [name] to start fresh on her medication regimen starting tomorrow morning. Used teachback method to ascertain that Christy [name] understood directive to take AM bubble pack of medications and skip the PM bubble pack of medications.   Goals Addressed            This Visit's Progress   . "I don't want to take so many pills" (pt-stated)       Current Barriers:  . High pill burden . Complex problem list requiring many long term medications  Pharmacist Clinical Goal(s): Over the next 90 days, Christy [name] will be adherent to all medications as evidenced by a percentage of days covered [facility]) report of >80%.   Interventions: . Patient educated on purpose, proper use and potential adverse effects of all medications. . CCM pharmacist will work with providers to convert Christy [name]'s medications to once daily dosing  . CCM pharmacist will provide updated medication list to all of Ms.  [name]'s medical providers  Patient Self Care Activities:  . Take all medications as prescribed  Please see past updates related to this goal by clicking on the "Past Updates" button in the selected goal           Telephone follow up appointment with CCM team member scheduled for: 7 days. CCM pharmacist and CCM RN will again make a joint outreach to Christy [name]. CCM pharmacist is concerned for medication adherence and Christy [name]'s ability to live independently at this time.    Christy [name], PharmD Clinical Pharmacist [facility] [phone]

## 2018-03-07 ENCOUNTER — Ambulatory Visit: Payer: Self-pay | Admitting: Family Medicine

## 2018-03-13 ENCOUNTER — Ambulatory Visit: Payer: Self-pay | Admitting: Pharmacist

## 2018-03-13 DIAGNOSIS — E1140 Type 2 diabetes mellitus with diabetic neuropathy, unspecified: Secondary | ICD-10-CM

## 2018-03-13 DIAGNOSIS — F200 Paranoid schizophrenia: Secondary | ICD-10-CM

## 2018-03-13 DIAGNOSIS — Z6841 Body Mass Index (BMI) 40.0 and over, adult: Secondary | ICD-10-CM

## 2018-03-13 NOTE — Patient Instructions (Signed)
Goals Addressed            This Visit's Progress   . "I don't want to take so many pills" (pt-stated)   On track    Current Barriers:  . High pill burden . Complex problem list requiring many long term medications  Pharmacist Clinical Goal(s): Over the next 90 days, Ms. Vincenti will be adherent to all medications as evidenced by a percentage of days covered [facility]) report of >80%.   Interventions: . Patient educated on purpose, proper use and potential adverse effects of all medications. . CCM pharmacist will work with providers to convert Ms. Mccahill's medications to once daily dosing  . CCM pharmacist will provide updated medication list to all of Ms. Vincelette's medical providers  Patient Self Care Activities:  . Take all medications as prescribed  Please see past updates related to this goal by clicking on the "Past Updates" button in the selected goal          The patient verbalized understanding of instructions provided today and declined a print copy of patient instruction materials.

## 2018-03-13 NOTE — Chronic Care Management (AMB) (Signed)
  Chronic Care Management   Follow Up Note   03/13/2018 Name: Christy [name] MRN: [id number] DOB: [birth year]  Referred by: Maple Hudson., MD Reason for referral : Chronic Care Management (one week follow up- medication check)   Subjective: Christy [name] is a 63 year old female patient of Dr. Julieanne Manson, engaged with CCM team for diabetes and medication management. Outreach today was to assess medication adherence following missed doses last week. HIPAA identifiers verified.   Assessment: Christy [name] reports 100% adherence to medications since last Monday. She started on her new weekly pill pack yesterday (Sunday). She had scrambled eggs and a fried fish filet for her meal today, but she has not checked her blood sugar yet. She has been drinking iced water.   Goals Addressed            This Visit's Progress   . "I don't want to take so many pills" (pt-stated)   On track    Current Barriers:  . High pill burden . Complex problem list requiring many long term medications  Pharmacist Clinical Goal(s): Over the next 90 days, Christy [name] will be adherent to all medications as evidenced by a percentage of days covered [facility]) report of >80%.   Interventions: . Patient educated on purpose, proper use and potential adverse effects of all medications. . CCM pharmacist will work with providers to convert Christy [name]'s medications to once daily dosing  . CCM pharmacist will provide updated medication list to all of Christy [name]'s medical providers  Patient Self Care Activities:  . Take all medications as prescribed  Please see past updates related to this goal by clicking on the "Past Updates" button in the selected goal           Telephone follow up appointment with CCM team member scheduled for: 03/20/18 for medication adherence assessment.    [name], PharmD Clinical Pharmacist [facility] [phone]

## 2018-03-14 ENCOUNTER — Encounter: Payer: Self-pay | Admitting: Family Medicine

## 2018-03-14 ENCOUNTER — Ambulatory Visit (INDEPENDENT_AMBULATORY_CARE_PROVIDER_SITE_OTHER): Payer: Medicaid Other | Admitting: Family Medicine

## 2018-03-14 VITALS — BP 128/82 | HR 86 | Temp 98.8°F | Resp 16 | Ht 70.0 in | Wt 273.0 lb

## 2018-03-14 DIAGNOSIS — E782 Mixed hyperlipidemia: Secondary | ICD-10-CM

## 2018-03-14 DIAGNOSIS — Z6841 Body Mass Index (BMI) 40.0 and over, adult: Secondary | ICD-10-CM

## 2018-03-14 DIAGNOSIS — M25561 Pain in right knee: Secondary | ICD-10-CM | POA: Diagnosis not present

## 2018-03-14 DIAGNOSIS — I10 Essential (primary) hypertension: Secondary | ICD-10-CM | POA: Diagnosis not present

## 2018-03-14 DIAGNOSIS — F200 Paranoid schizophrenia: Secondary | ICD-10-CM

## 2018-03-14 DIAGNOSIS — M25562 Pain in left knee: Secondary | ICD-10-CM

## 2018-03-14 DIAGNOSIS — G8929 Other chronic pain: Secondary | ICD-10-CM

## 2018-03-14 DIAGNOSIS — E1142 Type 2 diabetes mellitus with diabetic polyneuropathy: Secondary | ICD-10-CM

## 2018-03-14 NOTE — Progress Notes (Signed)
Patient: Christy [name] Female    DOB: November 30, 1955   63 y.o.   MRN: [id number] Visit Date: 03/14/2018  Today's Provider: Megan Mans, MD   Chief Complaint  Patient presents with  . Follow-up   Subjective:     HPI  Patient comes in today for a follow up. She was last seen in the office 2 months ago. Patient was advised to try and lose weight for next visit. Patient reports that she is taking her medications as directed, and she has lost 25lbs since her last visit. She reports that she occasionally has bilateral knee pain, which makes it hard for her to stand. Symptoms are temporally relieved with tylenol and Ibuprofen.  Overall I think she is doing well.  Morbid obesity and significant arthritis of the knees patient requests a lift chair and I think this is very reasonable to do.  She lives alone with people looking in order to help her.  She is still able to ambulate around her apartment but needs help getting up and down from her chair.  Allergies  Allergen Reactions  . Benzoyl Peroxide   . Cephalexin Itching  . Peroxide [Hydrogen Peroxide] Swelling  . Shellfish Allergy Nausea And Vomiting     Current Outpatient Medications:  .  ACCU-CHEK SOFTCLIX LANCETS lancets, Check sugar once daily  DX E11.9, Disp: 100 each, Rfl: 12 .  Amantadine HCl 100 MG tablet, Take 1 tablet (100 mg total) by mouth 2 (two) times daily., Disp: , Rfl:  .  aspirin EC 81 MG tablet, Take 81 mg by mouth daily., Disp: , Rfl:  .  dicyclomine (BENTYL) 10 MG capsule, Take 1 capsule (10 mg total) by mouth 2 (two) times daily., Disp: 60 capsule, Rfl: 11 .  docusate sodium (COLACE) 100 MG capsule, Take 1 capsule (100 mg total) by mouth daily. (Patient taking differently: Take 200 mg by mouth daily. ), Disp: 30 capsule, Rfl: 0 .  fluPHENAZine (PROLIXIN) 2.5 MG/ML injection, Inject into the muscle., Disp: , Rfl:  .  fluticasone (FLONASE) 50 MCG/ACT nasal spray, Place 2 sprays into both nostrils daily as  needed for allergies., Disp: 16 g, Rfl: 12 .  furosemide (LASIX) 40 MG tablet, Take 1 tablet (40 mg total) by mouth 2 (two) times daily., Disp: 60 tablet, Rfl: 6 .  glimepiride (AMARYL) 4 MG tablet, Two tablets daily with breakfast, Disp: 180 tablet, Rfl: 3 .  glucose blood (ACCU-CHEK AVIVA) test strip, Check sugar once daily DX E11.9, Disp: 50 each, Rfl: 12 .  Lancet Devices (ACCU-CHEK SOFTCLIX) lancets, Check sugar once daily, DX E11.9, Disp: 50 each, Rfl: 12 .  loratadine (CLARITIN) 10 MG tablet, Take 1 tablet (10 mg total) by mouth daily., Disp: 30 tablet, Rfl: 12 .  meloxicam (MOBIC) 15 MG tablet, Take 15 mg by mouth daily., Disp: , Rfl:  .  metFORMIN (GLUCOPHAGE) 1000 MG tablet, Take 1 tablet (1,000 mg total) by mouth 2 (two) times daily with a meal., Disp: 180 tablet, Rfl: 3 .  metoprolol succinate (TOPROL-XL) 25 MG 24 hr tablet, Take 0.5 tablets (12.5 mg total) by mouth daily., Disp: 15 tablet, Rfl: 12 .  ondansetron (ZOFRAN) 4 MG tablet, Take 1 tablet (4 mg total) by mouth every 8 (eight) hours as needed for nausea or vomiting., Disp: 60 tablet, Rfl: 5 .  pantoprazole (PROTONIX) 40 MG tablet, Take 40 mg by mouth daily., Disp: , Rfl:  .  simvastatin (ZOCOR) 10 MG tablet, Take  1 tablet (10 mg total) by mouth daily., Disp: 30 tablet, Rfl: 12 .  triamterene-hydrochlorothiazide (MAXZIDE-25) 37.5-25 MG tablet, Take 1 tablet by mouth daily., Disp: 30 tablet, Rfl: 12 .  trihexyphenidyl (ARTANE) 2 MG tablet, Take 2 mg by mouth daily., Disp: , Rfl:  .  valACYclovir (VALTREX) 1000 MG tablet, Take 1 tablet (1,000 mg total) by mouth daily., Disp: 90 tablet, Rfl: 3  Review of Systems  Constitutional: Negative for activity change, appetite change, chills, diaphoresis, fatigue and fever.  HENT: Negative.   Eyes: Negative.   Respiratory: Negative for cough and shortness of breath.   Cardiovascular: Negative.  Negative for chest pain, palpitations and leg swelling.  Gastrointestinal: Negative.     Endocrine: Negative.   Musculoskeletal: Positive for arthralgias and myalgias.  Allergic/Immunologic: Negative.   Neurological: Negative for dizziness.  Hematological: Negative.   Psychiatric/Behavioral: Negative.     Social History   Tobacco Use  . Smoking status: Current Some Day Smoker    Packs/day: 0.00    Types: Cigarettes  . Smokeless tobacco: Never Used  . Tobacco comment: only smokes sometimes  Substance Use Topics  . Alcohol use: Yes    Comment: beer occasionally      Objective:   BP 128/82 (BP Location: Left Arm, Patient Position: Sitting, Cuff Size: Large)   Pulse 86   Temp 98.8 F (37.1 C)   Resp 16   Ht 5\' 10"  (1.778 m)   Wt 273 lb (123.8 kg)   BMI 39.17 kg/m  Vitals:   03/14/18 1515  BP: 128/82  Pulse: 86  Resp: 16  Temp: 98.8 F (37.1 C)  Weight: 273 lb (123.8 kg)  Height: 5\' 10"  (1.778 m)     Physical Exam Constitutional:      Appearance: She is well-developed. She is obese.     Comments: Obese BF NAD  HENT:     Head: Normocephalic and atraumatic.     Right Ear: External ear normal.     Left Ear: External ear normal.     Nose: Nose normal.  Eyes:     General: No scleral icterus.    Conjunctiva/sclera: Conjunctivae normal.  Neck:     Thyroid: No thyromegaly.  Cardiovascular:     Rate and Rhythm: Normal rate and regular rhythm.     Heart sounds: Normal heart sounds.  Pulmonary:     Effort: Pulmonary effort is normal.     Breath sounds: Normal breath sounds.  Abdominal:     Palpations: Abdomen is soft.  Skin:    General: Skin is warm and dry.  Neurological:     Mental Status: She is alert and oriented to person, place, and time.     Comments: Nonfocal.  Psychiatric:        Behavior: Behavior normal.        Thought Content: Thought content normal.        Judgment: Judgment normal.         Assessment & Plan    1. Essential hypertension Good control.  2. Diabetic polyneuropathy associated with type 2 diabetes mellitus  (HCC) A1c on next visit.  She has been greatly helped with diabetic control through CCM helping her with her medication and diet.  More than 50% of 25-minute visit spent in counseling and collaboration/coordination of care.  3. Chronic pain of both knees Prescription written for lift chair.  Refer to Dr. B for consideration of steroid shots in the knees.  Trays will be obtained to both  knees today.  4. Paranoid schizophrenia (HCC) CCM is helping to make sure patient is taking her medication daily.  She is stable presently.  Is of note she has significant hand tremor right secondary to her medication but I think her schizophrenia must be controlled.  She has paranoid schizophrenia  5. Morbid obesity with body mass index (BMI) of 45.0 to 49.9 in adult [facility]) She has lost more than 20 pounds since starting with the help of CCM.  I am thrilled with this progress.  6. Mixed hyperlipidemia     I have done the exam and reviewed the above chart and it is accurate to the best of my knowledge. Dentist has been used in this note in any air is in the dictation or transcription are unintentional.  Megan Mans, MD  [facility]

## 2018-03-16 ENCOUNTER — Ambulatory Visit: Payer: Self-pay

## 2018-03-16 DIAGNOSIS — I10 Essential (primary) hypertension: Secondary | ICD-10-CM

## 2018-03-16 DIAGNOSIS — E1140 Type 2 diabetes mellitus with diabetic neuropathy, unspecified: Secondary | ICD-10-CM

## 2018-03-16 DIAGNOSIS — F200 Paranoid schizophrenia: Secondary | ICD-10-CM

## 2018-03-16 NOTE — Chronic Care Management (AMB) (Signed)
  Care Management   Note  03/16/2018 Name: Christy [name] MRN: [id number] DOB: 1955-08-31  Care Coordination: CCM RN CM was asked to assist with [name], primary care patient of Dr. Julieanne Manson, with scheduling transportation for her upcoming appointment with Dr. Beryle Flock 03/27/2018 at 2:40. [name] will also need to stop by [facility] prior to her appointment. CCM RN CM has scheduled Christy [name] transportation with [employer] through her Medicaid benefit and they are much aware of her destinations. Christy [name] and her HCPOA Christy [name] have been notified about appointments and transportation.  Plan: CCM RN CM will follow up with [name] during appointment   Lucina Betty E. Suzie Portela, RN, BSN Nurse Care Coordinator [facility] Care Management [phone]

## 2018-03-20 ENCOUNTER — Ambulatory Visit: Payer: Self-pay | Admitting: Pharmacist

## 2018-03-20 DIAGNOSIS — Z6841 Body Mass Index (BMI) 40.0 and over, adult: Secondary | ICD-10-CM

## 2018-03-20 DIAGNOSIS — E1140 Type 2 diabetes mellitus with diabetic neuropathy, unspecified: Secondary | ICD-10-CM

## 2018-03-20 DIAGNOSIS — Z9114 Patient's other noncompliance with medication regimen: Secondary | ICD-10-CM

## 2018-03-20 NOTE — Patient Instructions (Signed)
Goals Addressed            This Visit's Progress   . "I don't want to take so many pills" (pt-stated)       Current Barriers:  . High pill burden . Complex problem list requiring many long term medications  . Difficulty remembering to take medications  Pharmacist Clinical Goal(s): Over the next 90 days, Ms. Amend will be adherent to all medications as evidenced by a percentage of days covered [facility]) report of >80%.   Update 03/20/18: Carle has been 100% adherent in the past 14 days  Interventions: . Patient educated on purpose, proper use and potential adverse effects of all medications. . CCM pharmacist will work with providers to convert Ms. Deprey's medications to once daily dosing  . CCM pharmacist will provide updated medication list to all of Ms. Fortner's medical providers  Patient Self Care Activities:  . Take all medications as prescribed  Please see past updates related to this goal by clicking on the "Past Updates" button in the selected goal          The patient verbalized understanding of instructions provided today and declined a print copy of patient instruction materials.

## 2018-03-20 NOTE — Chronic Care Management (AMB) (Signed)
  Chronic Care Management   Follow Up Note   03/20/2018 Name: JANETTE MUSZYNSKI MRN: [id number] DOB: 19-Sep-1955  Referred by: Maple Hudson., MD Reason for referral : Chronic Care Management (pharmacy follow up)   Subjective:  Chadae Marina is a 63 year old female patient of Dr. Julieanne Manson engaged with the CCM team for diabetes education and medication adherence. Follow up outreach call today with CCM pharmacist as a adherence check. HIPAA identifiers verified.   Assessment:  #Medication adherence: Lilliam has not forgotten any doses of her medications in the past 14 days. She has been good at remembering to take them first thing in the morning.   #Diabetes: Burkley reports that she today for lunch she ate a pork chop and lima beans. Provided positive reinforcement and praise for eating good proteins and vegetables. Azile did report drinking a strawberry soda. We discussed drinking water or diet soda instead of juices or regular soda.   Goals Addressed            This Visit's Progress   . "I don't want to take so many pills" (pt-stated)       Current Barriers:  . High pill burden . Complex problem list requiring many long term medications  . Difficulty remembering to take medications  Pharmacist Clinical Goal(s): Over the next 90 days, Ms. Sokolski will be adherent to all medications as evidenced by a percentage of days covered [facility]) report of >80%.   Update 03/20/18: Raphaella has been 100% adherent in the past 14 days  Interventions: . Patient educated on purpose, proper use and potential adverse effects of all medications. . CCM pharmacist will work with providers to convert Ms. Faraj's medications to once daily dosing  . CCM pharmacist will provide updated medication list to all of Ms. Igoe's medical providers  Patient Self Care Activities:  . Take all medications as prescribed  Please see past updates related to this goal by clicking on the "Past Updates" button in the  selected goal           Face to Face appointment with CCM team member scheduled foR:  and Next PCP appointment scheduled for: Monday, March 2   [name], PharmD Clinical Pharmacist [facility] [phone]

## 2018-03-27 ENCOUNTER — Other Ambulatory Visit: Payer: Self-pay

## 2018-03-27 ENCOUNTER — Ambulatory Visit
Admission: RE | Admit: 2018-03-27 | Discharge: 2018-03-27 | Disposition: A | Discharge status: Home / Self Care | Payer: Medicaid Other | Attending: Family Medicine | Admitting: Family Medicine

## 2018-03-27 ENCOUNTER — Ambulatory Visit
Admission: RE | Admit: 2018-03-27 | Discharge: 2018-03-27 | Disposition: A | Discharge status: Home / Self Care | Payer: Medicaid Other | Source: Ambulatory Visit | Attending: Family Medicine | Admitting: Family Medicine

## 2018-03-27 ENCOUNTER — Ambulatory Visit (INDEPENDENT_AMBULATORY_CARE_PROVIDER_SITE_OTHER): Payer: Medicaid Other | Admitting: Family Medicine

## 2018-03-27 ENCOUNTER — Ambulatory Visit: Payer: Self-pay

## 2018-03-27 ENCOUNTER — Encounter: Payer: Self-pay | Admitting: Family Medicine

## 2018-03-27 VITALS — BP 137/74 | HR 73 | Temp 97.7°F | Wt 285.2 lb

## 2018-03-27 DIAGNOSIS — M25561 Pain in right knee: Secondary | ICD-10-CM

## 2018-03-27 DIAGNOSIS — M25569 Pain in unspecified knee: Secondary | ICD-10-CM | POA: Insufficient documentation

## 2018-03-27 DIAGNOSIS — M25562 Pain in left knee: Secondary | ICD-10-CM | POA: Insufficient documentation

## 2018-03-27 DIAGNOSIS — I10 Essential (primary) hypertension: Secondary | ICD-10-CM

## 2018-03-27 DIAGNOSIS — M170 Bilateral primary osteoarthritis of knee: Secondary | ICD-10-CM

## 2018-03-27 DIAGNOSIS — E1140 Type 2 diabetes mellitus with diabetic neuropathy, unspecified: Secondary | ICD-10-CM

## 2018-03-27 DIAGNOSIS — Z9114 Patient's other noncompliance with medication regimen: Secondary | ICD-10-CM

## 2018-03-27 MED ORDER — METHYLPREDNISOLONE ACETATE 40 MG/ML IJ SUSP
40.0000 mg | Freq: Once | INTRAMUSCULAR | Status: AC
  Administered 2018-03-27: 40 mg via INTRA_ARTICULAR

## 2018-03-27 NOTE — Chronic Care Management (AMB) (Signed)
Care Management   Follow Up Note   03/27/2018 Name: Christy [name] MRN: [id number] DOB: [birth year]  Referred by: Christy [name]., MD Reason for referral : Chronic Care Management (follow up DM/knee injection)    Subjective: "My knees are really hurting me" "I got up too late to take my morning medicine and eat breakfast"   Objective:  Lab Results  Component Value Date   HGBA1C 8.6 (H) 01/12/2018     Assessment: Christy [name] is a 63 year old female patient of Dr. Julieanne [name] who was referred to the CCM program by Dr. Sullivan [name] to address DM management. The CCM Team has continued to follow and assist Christy [name] with her healthcare needs and goals. CCM Team has also collaborated with patients POA Christy [name] to ensure patient continues to care for her self and medical needs at home. Christy [name] has engaged Christy [name] daily caregiver to assist with medication compliance and blood glucose monitoring. Today CCM Team followed up with Christy [name] during her appointment with Dr. Beryle [name] for knee injection.  Review of patient status, including review of consultants reports, relevant laboratory and other [name] results, and collaboration with appropriate care team members and the patient's provider was performed as part of comprehensive patient evaluation and provision of chronic care management services.    Goals Addressed            This Visit's Progress   . "I want to get better control of my sugar" (pt-stated)       Christy [name] presents to her PCP appointment at 2:00 having not eaten breakfast or taken her morning medications. Christy [name] states she stayed up [name] night watching TV and did not go to bed until 5 am. Christy [name] continues to have a Medicaid case worker who comes over daily at 10am to assist with bathing, laundry, meal preparation, and house cleaning. Christy [name] gets up to let her aide in however she returns to bed immediately. This has been discussed with Christy [name] POA who has  discussed with aide however no resolution has been obtained. Today, CCM RN CM discussed with Christy [name] importance of going to bed at night and remaining awake during the day hours in order to improve her diabetes control.  Current Barriers:  [name] Knowledge deficit related to DM self care . Cognition and mental health . Caregiver support and independent living  Nurse Case Manager Clinical Goal(s): Over the next 30 days, patient will engage in DM self care activities such as daily glucometer checks, medication compliance, ADA diet compliance  Interventions:   Discussed importance of checking blood sugars daily to see if knee injections will cause significant hyperglycemia  Discussed importance of limiting simple carbs such as cakes, candies, regular sodas, and juice  Discussed importance of eating regular meals  Reviewed importance of drinking water to "fluch" extra sugar out of her body.  Discussed utilizing walking as a way to reduce blood sugars  Discussed importance of taking medications every day as scheduled Patient Self Care Activities:  . Take medications as prescribed . Exercise as tolerated . Check blood sugars independently . Does not skip meals  Please see past updates related to this goal by clicking on the "Past Updates" button in the selected goal         Follow Up Plan: CCM RN CM will follow up with Christy [name] tomorrow to assess blood sugars    Christy [name] E. Christy Portela, RN, BSN Nurse Care [facility] Care Management [phone]

## 2018-03-27 NOTE — Patient Instructions (Signed)
  Thank you allowing the Chronic Care Management Team to be a part of your care! It was a pleasure speaking with you today!  1. Please check your blood sugar every morning. Your sugars may run higher after your knee injections. 2. Drink plenty of water to flush the sugar out of your body 3. Please use the sugar free candy to get the bitter taste of your pills out of your mouth. DO NOT USE LARGE GLASSES OF ORANGE JUICE. 4. Please take your morning medications when you get home. DO NOT take your evening pills today. You can resume taking your medications as prescribed in the morning. 5.   CCM (Chronic Care Management) Team   Yvone Neu RN, BSN Nurse Care Coordinator  [phone]  [name] PharmD  Clinical Pharmacist  [phone]   Goals Addressed            This Visit's Progress   . "I want to get better control of my sugar" (pt-stated)        Current Barriers:  [name] Knowledge deficit related to DM self care . Cognition and mental health . Caregiver support and independent living  Nurse Case Manager Clinical Goal(s): Over the next 30 days, patient will engage in DM self care activities such as daily glucometer checks, medication compliance, ADA diet compliance  Interventions:   Discussed importance of checking blood sugars daily to see if knee injections will cause significant hyperglycemia  Discussed importance of limiting simple carbs such as cakes, candies, regular sodas, and juice  Discussed importance of eating regular meals  Reviewed importance of drinking water to "fluch" extra sugar out of her body.  Discussed utilizing walking as a way to reduce blood sugars  Discussed importance of taking medications every day as scheduled Patient Self Care Activities:  . Take medications as prescribed . Exercise as tolerated . Check blood sugars independently . Does not skip meals  Please see past updates related to this goal by clicking on the "Past Updates"  button in the selected goal

## 2018-03-27 NOTE — Patient Instructions (Signed)

## 2018-03-27 NOTE — Progress Notes (Signed)
Patient: Christy [name] Female    DOB: 1955/04/06   63 y.o.   MRN: [id number] Visit Date: 03/27/2018  Today's Provider: Shirlee Latch, MD   Chief Complaint  Patient presents with  . Knee Pain   Subjective:    I, Porsha McClurkin CMA, am acting as a scribe for Shirlee Latch, MD.   HPI  Knee Pain Patient presents today for chronic pain of both knee injection. Patient has been trying to lose weight to help with her knee pain. Patient lives along and needs help getting up and down from her chair. Patient states she had x-ray done on bilateral knees today 03/27/2018. Patient states her pain is very high.   Allergies  Allergen Reactions  . Benzoyl Peroxide   . Cephalexin Itching  . Peroxide [Hydrogen Peroxide] Swelling  . Shellfish Allergy Nausea And Vomiting     Current Outpatient Medications:  .  ACCU-CHEK SOFTCLIX LANCETS lancets, Check sugar once daily  DX E11.9, Disp: 100 each, Rfl: 12 .  Amantadine HCl 100 MG tablet, Take 1 tablet (100 mg total) by mouth 2 (two) times daily., Disp: , Rfl:  .  aspirin EC 81 MG tablet, Take 81 mg by mouth daily., Disp: , Rfl:  .  dicyclomine (BENTYL) 10 MG capsule, Take 1 capsule (10 mg total) by mouth 2 (two) times daily., Disp: 60 capsule, Rfl: 11 .  docusate sodium (COLACE) 100 MG capsule, Take 1 capsule (100 mg total) by mouth daily. (Patient taking differently: Take 200 mg by mouth daily. ), Disp: 30 capsule, Rfl: 0 .  fluPHENAZine (PROLIXIN) 2.5 MG/ML injection, Inject into the muscle., Disp: , Rfl:  .  fluticasone (FLONASE) 50 MCG/ACT nasal spray, Place 2 sprays into both nostrils daily as needed for allergies., Disp: 16 g, Rfl: 12 .  furosemide (LASIX) 40 MG tablet, Take 1 tablet (40 mg total) by mouth 2 (two) times daily., Disp: 60 tablet, Rfl: 6 .  glimepiride (AMARYL) 4 MG tablet, Two tablets daily with breakfast, Disp: 180 tablet, Rfl: 3 .  glucose blood (ACCU-CHEK AVIVA) test strip, Check sugar once daily DX E11.9, Disp: 50  each, Rfl: 12 .  Lancet Devices (ACCU-CHEK SOFTCLIX) lancets, Check sugar once daily, DX E11.9, Disp: 50 each, Rfl: 12 .  loratadine (CLARITIN) 10 MG tablet, Take 1 tablet (10 mg total) by mouth daily., Disp: 30 tablet, Rfl: 12 .  meloxicam (MOBIC) 15 MG tablet, Take 15 mg by mouth daily., Disp: , Rfl:  .  metFORMIN (GLUCOPHAGE) 1000 MG tablet, Take 1 tablet (1,000 mg total) by mouth 2 (two) times daily with a meal., Disp: 180 tablet, Rfl: 3 .  metoprolol succinate (TOPROL-XL) 25 MG 24 hr tablet, Take 0.5 tablets (12.5 mg total) by mouth daily., Disp: 15 tablet, Rfl: 12 .  ondansetron (ZOFRAN) 4 MG tablet, Take 1 tablet (4 mg total) by mouth every 8 (eight) hours as needed for nausea or vomiting., Disp: 60 tablet, Rfl: 5 .  pantoprazole (PROTONIX) 40 MG tablet, Take 40 mg by mouth daily., Disp: , Rfl:  .  simvastatin (ZOCOR) 10 MG tablet, Take 1 tablet (10 mg total) by mouth daily., Disp: 30 tablet, Rfl: 12 .  triamterene-hydrochlorothiazide (MAXZIDE-25) 37.5-25 MG tablet, Take 1 tablet by mouth daily., Disp: 30 tablet, Rfl: 12 .  trihexyphenidyl (ARTANE) 2 MG tablet, Take 2 mg by mouth daily., Disp: , Rfl:  .  valACYclovir (VALTREX) 1000 MG tablet, Take 1 tablet (1,000 mg total) by mouth daily., Disp: 90  tablet, Rfl: 3  Review of Systems  HENT: Negative.   Respiratory: Negative.   Cardiovascular: Negative.   Musculoskeletal: Negative.   Neurological: Negative.   Hematological: Negative.     Social History   Tobacco Use  . Smoking status: Current Some Day Smoker    Packs/day: 0.00    Types: Cigarettes  . Smokeless tobacco: Never Used  . Tobacco comment: only smokes sometimes  Substance Use Topics  . Alcohol use: Yes    Comment: beer occasionally      Objective:   BP 137/74 (BP Location: Left Arm, Patient Position: Sitting, Cuff Size: Large)   Pulse 73   Temp 97.7 F (36.5 C) (Oral)   Wt 285 lb 3.2 oz (129.4 kg)   SpO2 96%   BMI 40.92 kg/m  Vitals:   03/27/18 1411  BP:  137/74  Pulse: 73  Temp: 97.7 F (36.5 C)  TempSrc: Oral  SpO2: 96%  Weight: 285 lb 3.2 oz (129.4 kg)     Physical Exam Vitals signs reviewed.  Constitutional:      General: She is not in acute distress.    Appearance: Normal appearance. She is not diaphoretic.  HENT:     Head: Normocephalic and atraumatic.  Eyes:     General: No scleral icterus.    Conjunctiva/sclera: Conjunctivae normal.  Cardiovascular:     Rate and Rhythm: Normal rate and regular rhythm.  Pulmonary:     Effort: Pulmonary effort is normal. No respiratory distress.  Musculoskeletal:     Right lower leg: No edema.     Left lower leg: No edema.     Comments: B/l Knee: Notable swelling of bilateral knees.  No erythema or obvious bony abnormalities.  Palpation reveals no warmth, but diffuse tenderness, especially over bilateral joint lines.  Range of motion is normal in flexion and extension and lower leg rotation, but patient experiences pain with terminal extension and flexion.  Crepitus palpated bilaterally. Ligaments with solid consistent endpoints including ACL, PCL, LCL, MCL. Patellar and quadriceps tendons unremarkable. Hamstring and quadriceps strength is normal.   Skin:    General: Skin is warm and dry.     Capillary Refill: Capillary refill takes less than 2 seconds.     Findings: No rash.  Neurological:     Mental Status: She is alert.     Sensory: No sensory deficit.     Motor: No weakness.     Gait: Gait abnormal (antalgic).  Psychiatric:        Mood and Affect: Mood normal.        Behavior: Behavior normal.      Bilateral complete knee x-rays-03/27/2018- personally reviewed the images- reveal bilateral tricompartmental osteoarthritis most significant in left lateral joint space    Assessment & Plan   1. Primary osteoarthritis of both knees -Chronic and longstanding -Patient is done well with corticosteroid injections previously in bilateral knees - Patient is limited in her ability to  do ADLs and walk due to her pain from her osteoarthritis -As above, x-rays completed today show significant osteoarthritis especially in the left lateral joint space -Discussed risks benefits and alternatives and patient decides to go ahead with corticosteroid injections today -Discussed that goal of therapy is to improve quality of life and ability to complete ADLs and daily functions -Discussed that these can be repeated up to every 3 months as needed for symptom management - discussed HEP  INJECTION: Patient was given informed consent,. Appropriate time out was taken. Area prepped  and draped in usual sterile fashion. 1 cc of depo-medrol 40 mg/ml plus  4 cc of 1% lidocaine without epinephrine was injected into the R knee using a(n) antero-medial approach and into the left knee using an anterior lateral approach.  Of note, patient already has some hypopigmentation over injection sites of bilateral knees.  the patient tolerated the procedure well. There were no complications. Post procedure instructions were given.    Meds ordered this encounter  Medications  . methylPREDNISolone acetate (DEPO-MEDROL) injection 40 mg  . methylPREDNISolone acetate (DEPO-MEDROL) injection 40 mg     Return if symptoms worsen or fail to improve.   The entirety of the information documented in the History of Present Illness, Review of Systems and Physical Exam were personally obtained by me. Portions of this information were initially documented by [facility], CMA and reviewed by me for thoroughness and accuracy.    Erasmo Downer, MD, MPH [facility] 03/27/2018 4:49 PM

## 2018-03-27 NOTE — Progress Notes (Deleted)
Patient: Christy [name] Female    DOB: [birth year]   63 y.o.   MRN: [id number] Visit Date: 03/27/2018  Today's Provider: Shirlee Latch, MD   No chief complaint on file.  Subjective:     HPI  Allergies  Allergen Reactions  . Benzoyl Peroxide   . Cephalexin Itching  . Peroxide [Hydrogen Peroxide] Swelling  . Shellfish Allergy Nausea And Vomiting     Current Outpatient Medications:  .  ACCU-CHEK SOFTCLIX LANCETS lancets, Check sugar once daily  DX E11.9, Disp: 100 each, Rfl: 12 .  Amantadine HCl 100 MG tablet, Take 1 tablet (100 mg total) by mouth 2 (two) times daily., Disp: , Rfl:  .  aspirin EC 81 MG tablet, Take 81 mg by mouth daily., Disp: , Rfl:  .  dicyclomine (BENTYL) 10 MG capsule, Take 1 capsule (10 mg total) by mouth 2 (two) times daily., Disp: 60 capsule, Rfl: 11 .  docusate sodium (COLACE) 100 MG capsule, Take 1 capsule (100 mg total) by mouth daily. (Patient taking differently: Take 200 mg by mouth daily. ), Disp: 30 capsule, Rfl: 0 .  fluPHENAZine (PROLIXIN) 2.5 MG/ML injection, Inject into the muscle., Disp: , Rfl:  .  fluticasone (FLONASE) 50 MCG/ACT nasal spray, Place 2 sprays into both nostrils daily as needed for allergies., Disp: 16 g, Rfl: 12 .  furosemide (LASIX) 40 MG tablet, Take 1 tablet (40 mg total) by mouth 2 (two) times daily., Disp: 60 tablet, Rfl: 6 .  glimepiride (AMARYL) 4 MG tablet, Two tablets daily with breakfast, Disp: 180 tablet, Rfl: 3 .  glucose blood (ACCU-CHEK AVIVA) test strip, Check sugar once daily DX E11.9, Disp: 50 each, Rfl: 12 .  Lancet Devices (ACCU-CHEK SOFTCLIX) lancets, Check sugar once daily, DX E11.9, Disp: 50 each, Rfl: 12 .  loratadine (CLARITIN) 10 MG tablet, Take 1 tablet (10 mg total) by mouth daily., Disp: 30 tablet, Rfl: 12 .  meloxicam (MOBIC) 15 MG tablet, Take 15 mg by mouth daily., Disp: , Rfl:  .  metFORMIN (GLUCOPHAGE) 1000 MG tablet, Take 1 tablet (1,000 mg total) by mouth 2 (two) times daily with a meal.,  Disp: 180 tablet, Rfl: 3 .  metoprolol succinate (TOPROL-XL) 25 MG 24 hr tablet, Take 0.5 tablets (12.5 mg total) by mouth daily., Disp: 15 tablet, Rfl: 12 .  ondansetron (ZOFRAN) 4 MG tablet, Take 1 tablet (4 mg total) by mouth every 8 (eight) hours as needed for nausea or vomiting., Disp: 60 tablet, Rfl: 5 .  pantoprazole (PROTONIX) 40 MG tablet, Take 40 mg by mouth daily., Disp: , Rfl:  .  simvastatin (ZOCOR) 10 MG tablet, Take 1 tablet (10 mg total) by mouth daily., Disp: 30 tablet, Rfl: 12 .  triamterene-hydrochlorothiazide (MAXZIDE-25) 37.5-25 MG tablet, Take 1 tablet by mouth daily., Disp: 30 tablet, Rfl: 12 .  trihexyphenidyl (ARTANE) 2 MG tablet, Take 2 mg by mouth daily., Disp: , Rfl:  .  valACYclovir (VALTREX) 1000 MG tablet, Take 1 tablet (1,000 mg total) by mouth daily., Disp: 90 tablet, Rfl: 3  Review of Systems  Social History   Tobacco Use  . Smoking status: Current Some Day Smoker    Packs/day: 0.00    Types: Cigarettes  . Smokeless tobacco: Never Used  . Tobacco comment: only smokes sometimes  Substance Use Topics  . Alcohol use: Yes    Comment: beer occasionally      Objective:   There were no vitals taken for this visit. There  were no vitals filed for this visit.   Physical Exam      Assessment & Plan        Shirlee Latch, MD  [facility]

## 2018-03-28 ENCOUNTER — Ambulatory Visit: Payer: Self-pay

## 2018-03-28 DIAGNOSIS — E1140 Type 2 diabetes mellitus with diabetic neuropathy, unspecified: Secondary | ICD-10-CM

## 2018-03-28 DIAGNOSIS — M25562 Pain in left knee: Principal | ICD-10-CM

## 2018-03-28 DIAGNOSIS — M25561 Pain in right knee: Secondary | ICD-10-CM

## 2018-03-28 NOTE — Chronic Care Management (AMB) (Signed)
   Care Management   Unsuccessful Call Note 03/28/2018 Name: Christy [name] MRN: [id number] DOB: Oct 08, 1955  Christy [name] is a 63 year old female patient of Dr. Julieanne Manson who was referred to the CCM program by Dr. Sullivan Lone to address DM management. The CCM Team has continued to follow and assist Christy [name] with her healthcare needs and goals. CCM Team has also collaborated with patients POA Christy [name] to ensure patient continues to care for her self and medical needs at home. Christy [name] has engaged Christy [name] daily caregiver to assist with medication compliance and blood glucose monitoring.Today CCM RN CM followed up with Christy [name] to see how she was doing post bilateral knee injections 03/27/2018.    Was unable to reach patient via telephone today. Was unable to leave a message as voice mailbox has not been set up. (unsuccessful outreach #1).   Plan: Will follow-up within 7 business days via telephone.    Zissy Hamlett E. Suzie Portela, RN, BSN Nurse Care Coordinator [facility] Care Management [phone]

## 2018-04-10 ENCOUNTER — Telehealth: Payer: Self-pay

## 2018-04-12 ENCOUNTER — Other Ambulatory Visit: Payer: Self-pay

## 2018-04-12 ENCOUNTER — Ambulatory Visit: Payer: Self-pay

## 2018-04-12 DIAGNOSIS — Z91148 Patient's other noncompliance with medication regimen for other reason: Secondary | ICD-10-CM

## 2018-04-12 DIAGNOSIS — E119 Type 2 diabetes mellitus without complications: Secondary | ICD-10-CM

## 2018-04-12 DIAGNOSIS — Z9114 Patient's other noncompliance with medication regimen: Secondary | ICD-10-CM

## 2018-04-12 DIAGNOSIS — I509 Heart failure, unspecified: Secondary | ICD-10-CM

## 2018-04-12 NOTE — Chronic Care Management (AMB) (Signed)
Care Management   Follow Up Note   04/12/2018 Name: Christy [name] MRN: [id number] DOB: 12-21-1955  Referred by: Maple Hudson., MD Reason for referral : Chronic Care Management (follow up on DM/arthritic knees)    Subjective: "I'm doing good but I don't want to go to the hospital if I get this new virus" "I haven't taken my sugars in a couple of days"   Objective:  Lab Results  Component Value Date   HGBA1C 8.6 (H) 01/12/2018   Wt Readings from Last 3 Encounters:  03/27/18 285 lb 3.2 oz (129.4 kg)  03/14/18 273 lb (123.8 kg)  01/12/18 298 lb (135.2 kg)   Assessment: Christy [name] is a 63 year old female patient of Dr. Julieanne Manson who was referred to the CCM program by Dr. Sullivan Lone to address DM management. The CCM Team has continued to follow and assist Christy [name] with her healthcare needs and goals.Today CCM RN CM followed up with Christy [name] to assess DM self care compliance, knee pain, and questions/concerns about COVID-19.   Review of patient status, including review of consultants reports, relevant laboratory and other test results, and collaboration with appropriate care team members and the patient's provider was performed as part of comprehensive patient evaluation and provision of chronic care management services.    Goals Addressed            This Visit's Progress   . "I want to get better control of my sugar" (pt-stated)       Christy [name] states she has taken her medications this morning and recently eaten breakfast. This is a significant improvement considering it is awake at 10:50am. Christy [name] states her Medicaid Aide is present and assisting her with her needs. During last few conversations with Christy [name], she stated she was staying up all night watching TV and sleeping all day which was causing her to not eat and be noncompliant with taking her medications. Christy [name] has not checked her sugar today, but willing to check her sugar while on the phone with CCM RN CM.  Blood glucose 268, 45 minutes post prandial. [name] acknowledges her sugar was "high" however was not aware that food makes her blood sugar increase right after she eats.   Christy [name] is concerned "I might get that virus". She is fearful she will have to go to the hospital if she test positive for COVID-19. She was provided with simple instructions to ensure she understands the importance of social distancing (including her boyfriend that lives down the hall), hand hygiene, and symptom reporting. Her Aide continue to come to her home daily to provide assistance with ADLs and IADLs.  Christy [name] is still complaining of knee patient but states it is much better than it was before she got her injections. She states "my knees are still sore where the needle was". Christy [name] denied any symptoms of infection including redness at injections sight, swelling, or warm to touch. Current Barriers:  [name] Knowledge deficit related to DM self care . Cognition and mental health . Caregiver support and independent living  Nurse Case Manager Clinical Goal(s): Over the next 60 days, patient will engage in DM self care activities such as daily glucometer checks, medication compliance, ADA diet compliance  Interventions:   Discussed importance of checking blood sugars daily  Discussed importance of limiting simple carbs such as cakes, candies, regular sodas, and juice  Discussed importance of eating regular meals  Reviewed importance of drinking  water to "flush" extra sugar out of her body.  Discussed utilizing walking as a way to reduce blood sugars  Discussed importance of taking medications every day as scheduled  Provided emotional support and reassurance related to current pandemic of COVID-19  Discussed current CDC recommendations for social distancing and hand hygiene.    Patient Self Care Activities:  . Take medications as prescribed . Exercise as tolerated . Check blood sugars independently . Does not  skip meals . Follow CDC guidelines for symptom reporting, social distancing, and hand hygiene  Please see past updates related to this goal by clicking on the "Past Updates" button in the selected goal       Follow Up Plan: Will follow up with patient in 30 days   Ottilia Pippenger E. Suzie Portela, RN, BSN Nurse Care Coordinator [facility] Care Management [phone]

## 2018-04-12 NOTE — Patient Instructions (Signed)
Thank you allowing the Chronic Care Management Team to be a part of your care! It was a pleasure speaking with you today!  1. Take your medicines as prescribed. Try not to miss them. 2. Check your blood sugar daily before you eat breakfast 3. Limit your regular sodas, candies, breads, and sweets 4. You can have sugar free candy and diet soda but water is best for you to drink. 5. You can take 2 tylenol for your arthritis. You can also try warm heat or ice on your knees. 6. Make sure you are not skipping meals. This will make you feel bad. 7. Please try to go to bed before midnight so you can rest during the night and stay awake during the day when you need to take your medications. 8. Follow the CDC guidelines for symptom reporting, social distancing, and hand hygiene  Handwashing is one of the best ways to protect yourself and your family from getting sick. Wash Your Hands Often to Stay Healthy  When: You can help yourself and your loved ones stay healthy by washing your hands often, especially during these key times when you are likely to get and spread germs: . Before, during, and after preparing food  . Before eating food  . Before and after caring for someone at home who is sick with vomiting or diarrhea  . Before and after treating a cut or wound  . After using the toilet  . After being in public spaces . After changing diapers or cleaning up a child who has used the toilet  . After blowing your nose, coughing, or sneezing  . After touching an animal, animal feed, or animal waste  . After handling pet food or pet treats  . After touching garbage . After touching public doors, telephones, shopping carts, or other surfaces .  Five Steps to Wash Your Hands the Right Way 1. Wet your hands with clean, running water (warm or cold), turn off the tap, and apply soap. 2. Lather your hands by rubbing them together with the soap. Lather the backs of your hands, between your fingers, and  under your nails. 3. Scrub your hands for at least 20 seconds. Need a timer? Hum the "Happy Birthday" song from beginning to end twice. 4. Rinse your hands well under clean, running water. 5. Dry your hands using a clean towel or air dry them.  Source: icrowncustoms.com  For information about COVID-19 or "Corona Virus", the following web resources may be helpful:  CDC: http://bradshaw.com/    [place]:  http://monroe.info/  CCM (Chronic Care Management) Team   Yvone Neu RN, BSN Nurse Care Coordinator  [phone]  [name] PharmD  Clinical Pharmacist  [phone]   [place] [address] LCSW Clinical Social Worker [phone]  Goals Addressed            This Visit's Progress   . "I want to get better control of my sugar" (pt-stated)        Current Barriers:  [name] Knowledge deficit related to DM self care . Cognition and mental health . Caregiver support and independent living  Nurse Case Manager Clinical Goal(s): Over the next 60 days, patient will engage in DM self care activities such as daily glucometer checks, medication compliance, ADA diet compliance  Interventions:   Discussed importance of checking blood sugars daily  Discussed importance of limiting simple carbs such as cakes, candies, regular sodas, and juice  Discussed importance of eating regular meals  Reviewed  importance of drinking water to "flush" extra sugar out of her body.  Discussed utilizing walking as a way to reduce blood sugars  Discussed importance of taking medications every day as scheduled  Provided emotional support and reassurance related to current pandemic of COVID-19  Discussed current CDC recommendations for social distancing and hand hygiene.    Patient Self Care Activities:  . Take medications as  prescribed . Exercise as tolerated . Check blood sugars independently . Does not skip meals . Follow CDC guidelines for symptom reporting, social distancing, and hand hygiene  Please see past updates related to this goal by clicking on the "Past Updates" button in the selected goal             The patient verbalized understanding of instructions provided today and declined a print copy of patient instruction materials.   Telephone follow up appointment with CCM team member scheduled for: 30 days

## 2018-04-17 ENCOUNTER — Telehealth: Payer: Self-pay

## 2018-04-17 IMAGING — RF DG ESOPHAGUS
4 series · 7 of 7 positions shown · non-contrast
Comparison: None in PACs

CLINICAL DATA: Dysphagia and vomiting postprandially for several
months; history of gastroesophageal reflux

EXAM:
ESOPHOGRAM/BARIUM SWALLOW
TECHNIQUE: Single contrast examination was performed using thin barium or water
soluble. A barium tablet was administered as well.
FLUOROSCOPY TIME:  Fluoroscopy Time:  0 minutes, 54 seconds
Number of Acquired Images: 3 fluoro spot images and once and 8
image.

[Series 1: fluoro_barium 2fps_bw · 0.17mm/px · 2 of 2 frames shown (1 of 4)]
[frame 1/2]
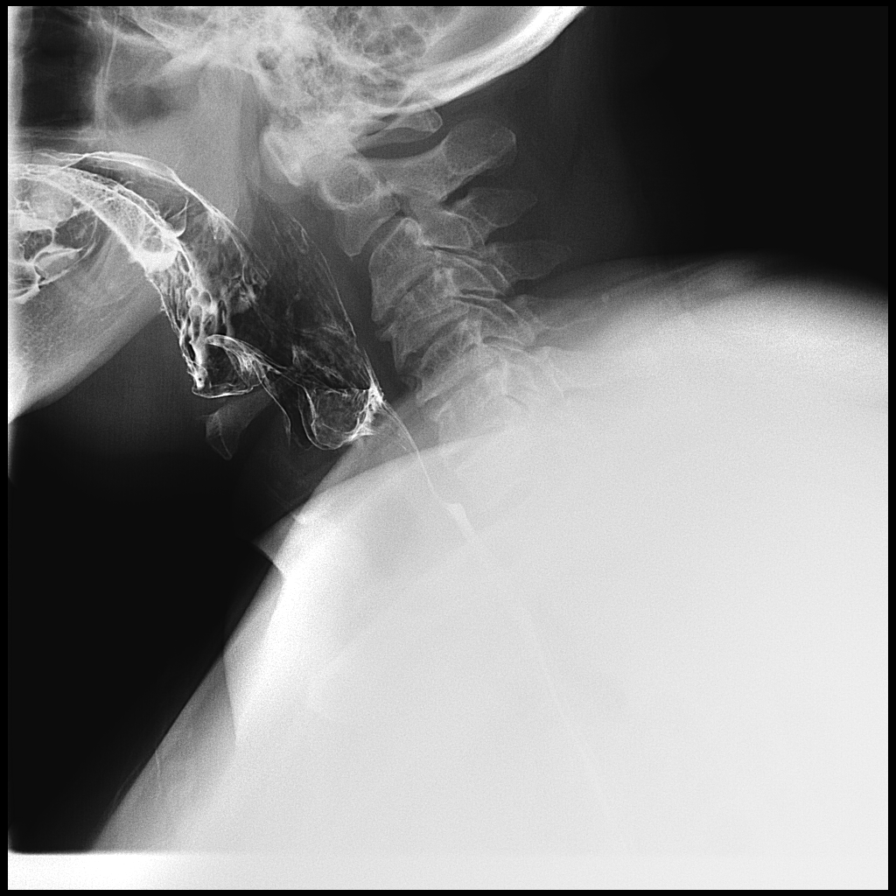
[frame 2/2]
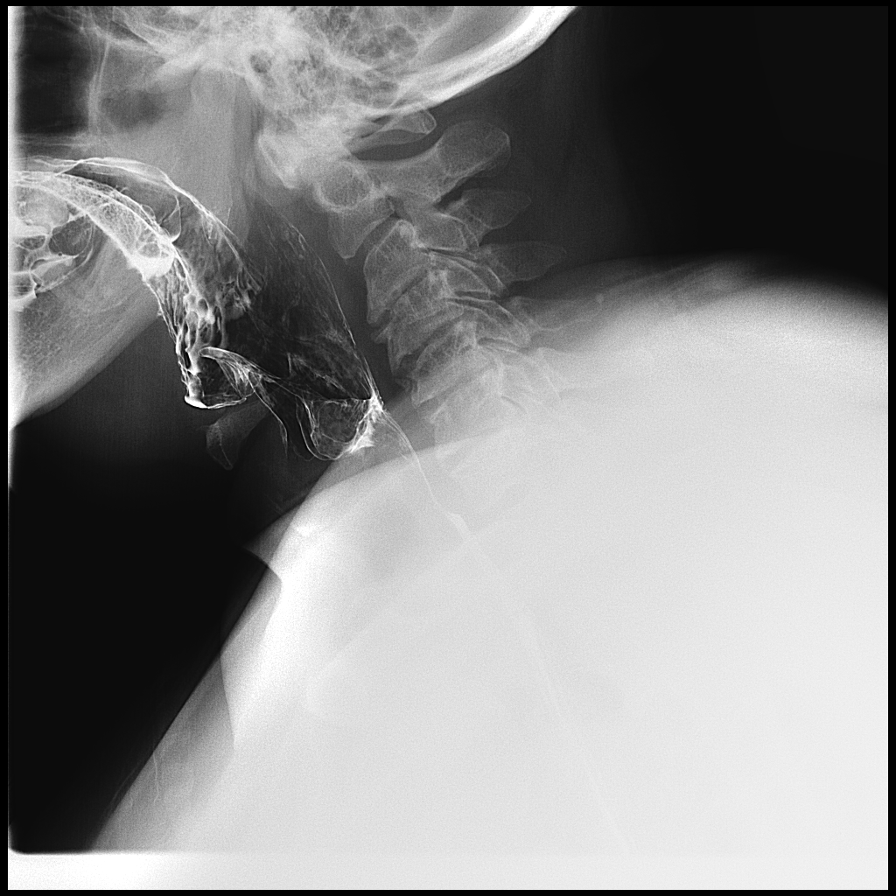

[Series 2: fluoro_barium 2fps_bw · 0.17mm/px · 1 of 1 slices shown (2 of 4)]
[im 1/1]
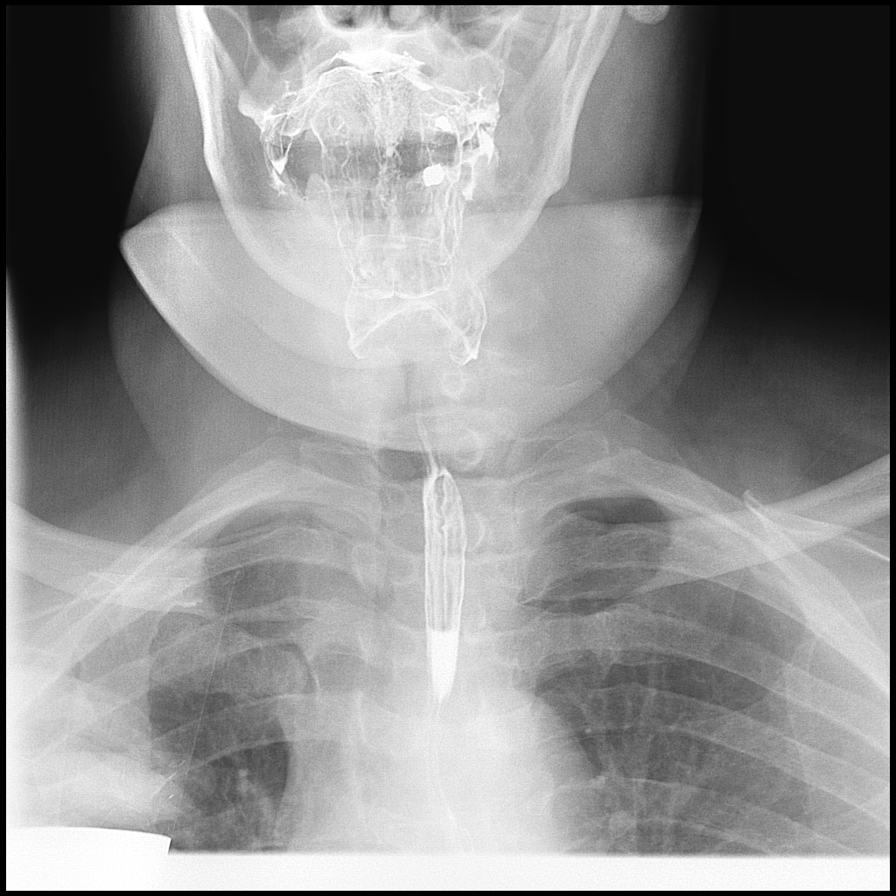

[Series 3: fluoro_barium 2fps_bw · 0.17mm/px · 1 of 1 slices shown (3 of 4)]
[im 1/1]
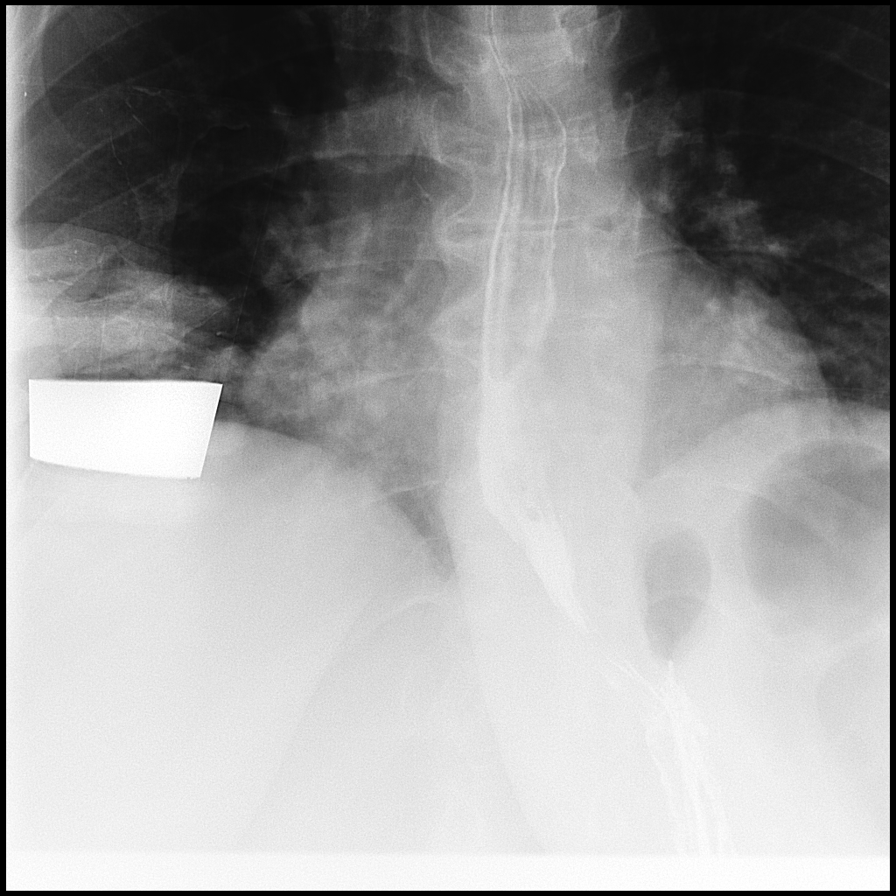

[Series 4: fluoro_barium 2fps_bw · 0.17mm/px · 3 of 20 frames shown (4 of 4)]
[frame 4/20]
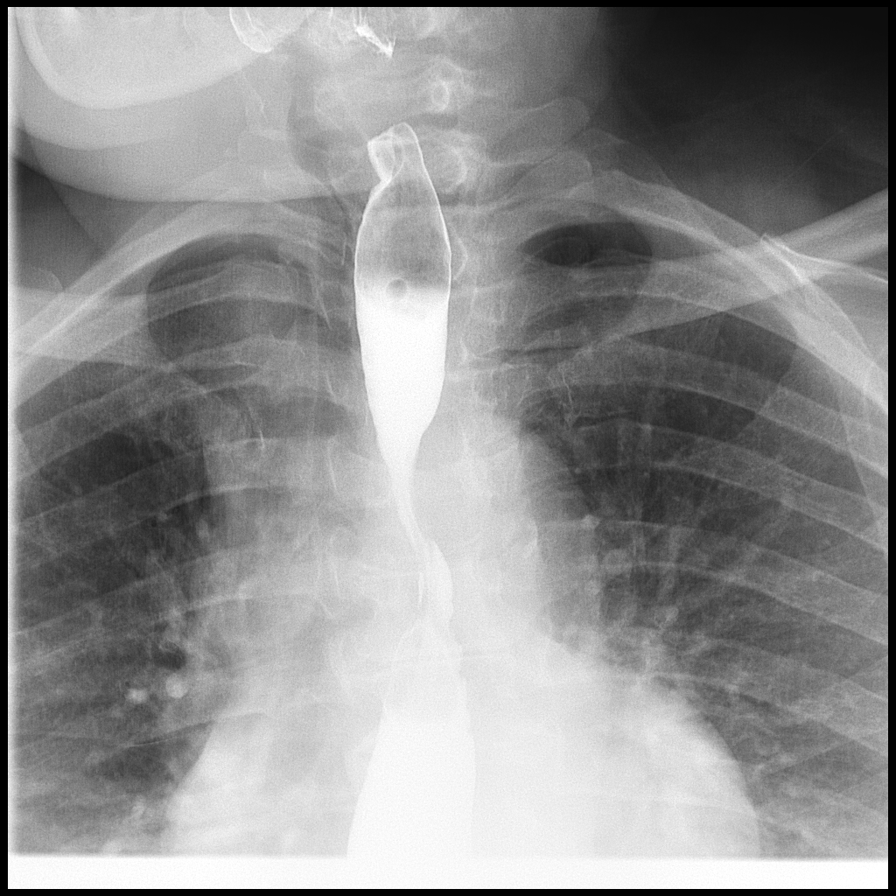
[frame 11/20]
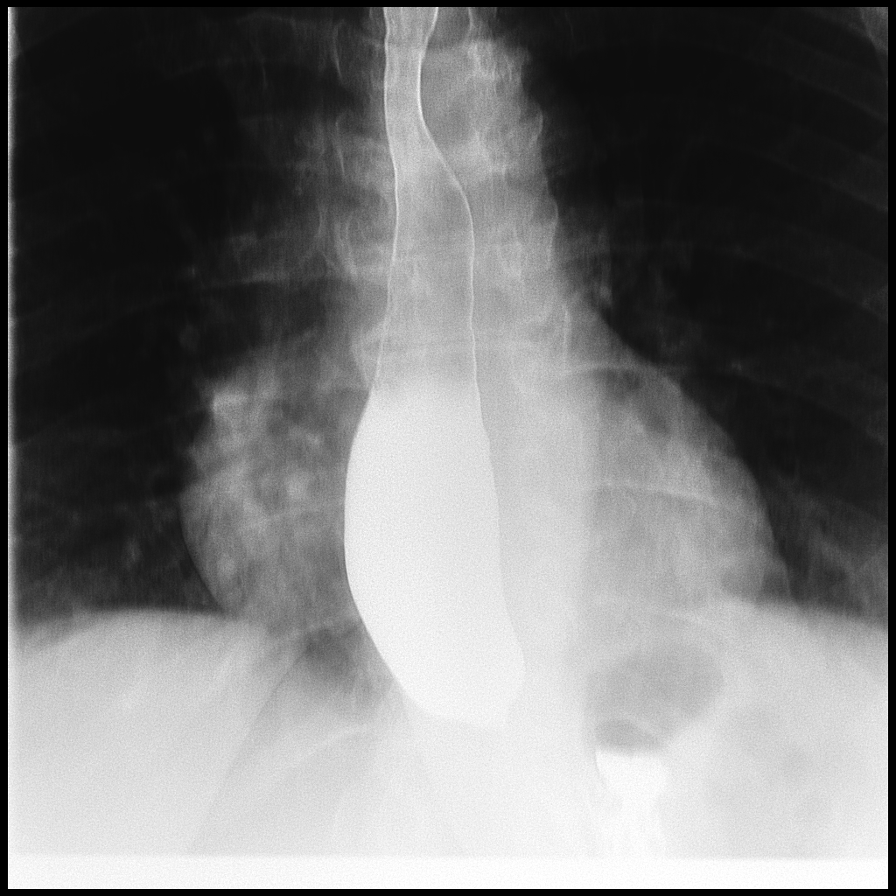
[frame 18/20]
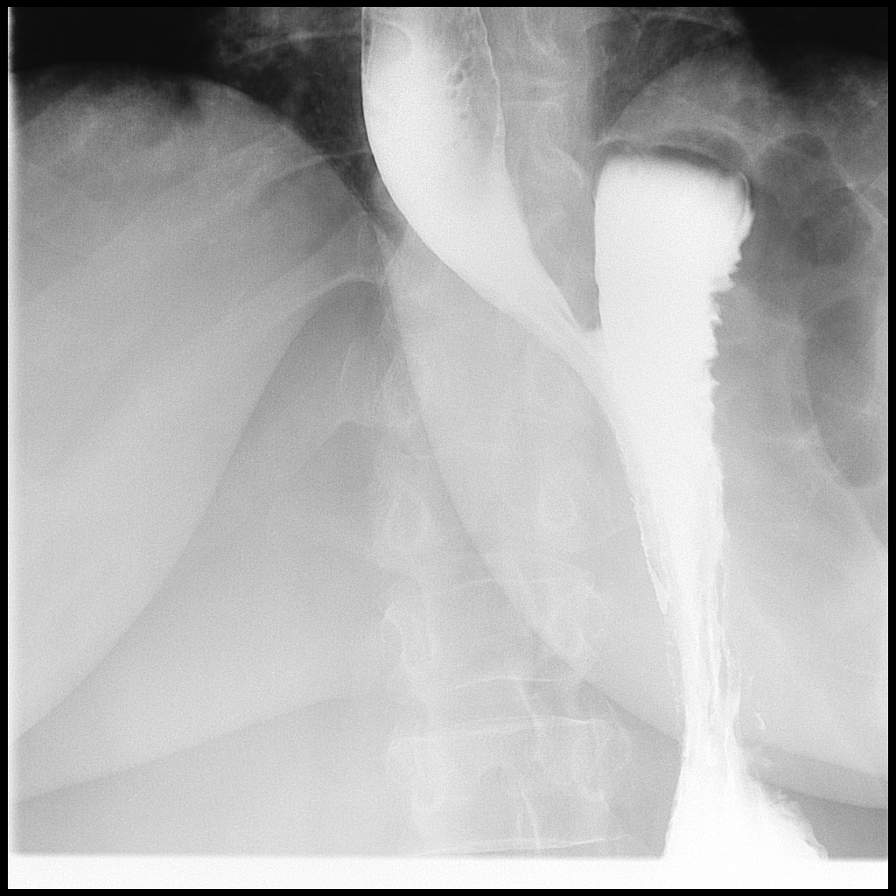

[7 of 7 positions shown; findings below may reference images not displayed]

FINDINGS: The study is limited due to the patient's clinical condition. The
patient ingested the thin barium without difficulty. There was
transient coating of the upper surface of the epiglottis but this
did clear. No laryngeal penetration of the barium was observed.
There was some delay in initiation of the primary swallow running
swallowing maneuver. However, once the barium but was propelled into
the cervical esophagus esophageal motility appeared normal. No
stricture was observed. No mucosal changes to suggest esophagitis
were demonstrated. There was no hiatal hernia. The barium tablet
passed into the stomach without difficulty.
IMPRESSION: Mild changes of presbyesophagus. No fixed stricture was observed.
The barium tablet passed without difficulty.

## 2018-04-18 ENCOUNTER — Telehealth: Payer: Self-pay

## 2018-04-19 ENCOUNTER — Other Ambulatory Visit: Payer: Self-pay

## 2018-04-19 ENCOUNTER — Ambulatory Visit: Payer: Self-pay | Admitting: Pharmacist

## 2018-04-19 DIAGNOSIS — I509 Heart failure, unspecified: Secondary | ICD-10-CM

## 2018-04-19 DIAGNOSIS — Z9114 Patient's other noncompliance with medication regimen: Secondary | ICD-10-CM

## 2018-04-19 DIAGNOSIS — E119 Type 2 diabetes mellitus without complications: Secondary | ICD-10-CM

## 2018-04-28 ENCOUNTER — Other Ambulatory Visit: Payer: Self-pay | Admitting: Family Medicine

## 2018-04-28 DIAGNOSIS — R600 Localized edema: Secondary | ICD-10-CM

## 2018-04-28 MED ORDER — FUROSEMIDE 40 MG PO TABS: 40.0000 mg | ORAL_TABLET | Freq: Two times a day (BID) | ORAL | 6 refills | Status: DC

## 2018-04-28 NOTE — Chronic Care Management (AMB) (Signed)
  Care Management   Follow Up Note  04/19/18 Name: SANSKRITI KADEL MRN: [id number] DOB: 12-18-1955  Referred by: Maple Hudson., MD Reason for referral : Chronic Care Management (Medication adherence)    Subjective: Outreach call to Lynnda Child to follow up on medication adherence and provide education on COVID19. HIPAA identifiers verified.   Assessment:  #Medication adherence: Cadance states she forgot to take her medications 1 morning out of the past 1 week. This is consistent with her past history of medication adherence.   #COVID19: Counseled on appropriate infection prevention and social distancing precautions, frequent handwashing, avoid touching face. S/sx of infection include SOB, dry cough, temperature >100F  Review of patient status, including review of consultants reports, relevant laboratory and other test results, and collaboration with appropriate care team members and the patient's provider was performed as part of comprehensive patient evaluation and provision of chronic care management services.    Goals    . "I don't want to take so many pills" (pt-stated)     Current Barriers:  . High pill burden . Complex problem list requiring many long term medications  . Difficulty remembering to take medications  Pharmacist Clinical Goal(s): Over the next 90 days, Ms. Thissen will be adherent to all medications as evidenced by a percentage of days covered [facility]) report of >80%.   Update 03/20/18: Lynnie has been 100% adherent in the past 14 days  Interventions: . Patient educated on purpose, proper use and potential adverse effects of all medications. . CCM pharmacist will work with providers to convert Ms. Blane's medications to once daily dosing  . CCM pharmacist will provide updated medication list to all of Ms. Borski's medical providers  Patient Self Care Activities:  . Take all medications as prescribed  Please see past updates related to this goal by clicking on the  "Past Updates" button in the selected goal       . "I want to get better control of my sugar" (pt-stated)      Current Barriers:  [name] Knowledge deficit related to DM self care . Cognition and mental health . Caregiver support and independent living  Nurse Case Manager Clinical Goal(s): Over the next 60 days, patient will engage in DM self care activities such as daily glucometer checks, medication compliance, ADA diet compliance  Interventions:   Discussed importance of checking blood sugars daily  Discussed importance of limiting simple carbs such as cakes, candies, regular sodas, and juice  Discussed importance of eating regular meals  Reviewed importance of drinking water to "flush" extra sugar out of her body.  Discussed utilizing walking as a way to reduce blood sugars  Discussed importance of taking medications every day as scheduled  Provided emotional support and reassurance related to current pandemic of COVID-19  Discussed current CDC recommendations for social distancing and hand hygiene.    Patient Self Care Activities:  . Take medications as prescribed . Exercise as tolerated . Check blood sugars independently . Does not skip meals . Follow CDC guidelines for symptom reporting, social distancing, and hand hygiene  Please see past updates related to this goal by clicking on the "Past Updates" button in the selected goal               Follow Up Plan:  2 weeks for medication adherence  [name], PharmD Clinical Pharmacist [facility] [phone]

## 2018-04-28 NOTE — Telephone Encounter (Signed)
[facility] faxed refill request for the following medications:  furosemide (LASIX) 40 MG tablet  Please advise. Thanks TNP

## 2018-05-03 ENCOUNTER — Ambulatory Visit: Payer: Self-pay | Admitting: Pharmacist

## 2018-05-03 ENCOUNTER — Other Ambulatory Visit: Payer: Self-pay

## 2018-05-03 DIAGNOSIS — Z9114 Patient's other noncompliance with medication regimen: Secondary | ICD-10-CM

## 2018-05-03 DIAGNOSIS — E119 Type 2 diabetes mellitus without complications: Secondary | ICD-10-CM

## 2018-05-08 ENCOUNTER — Encounter: Payer: Self-pay | Admitting: Family Medicine

## 2018-05-10 ENCOUNTER — Ambulatory Visit: Payer: Self-pay

## 2018-05-10 ENCOUNTER — Other Ambulatory Visit: Payer: Self-pay

## 2018-05-10 DIAGNOSIS — N183 Chronic kidney disease, stage 3 unspecified: Secondary | ICD-10-CM

## 2018-05-10 DIAGNOSIS — Z9114 Patient's other noncompliance with medication regimen: Secondary | ICD-10-CM

## 2018-05-10 DIAGNOSIS — F200 Paranoid schizophrenia: Secondary | ICD-10-CM

## 2018-05-10 DIAGNOSIS — E1140 Type 2 diabetes mellitus with diabetic neuropathy, unspecified: Secondary | ICD-10-CM

## 2018-05-10 NOTE — Patient Instructions (Signed)
  Thank you allowing the Chronic Care Management Team to be a part of your care! It was a pleasure speaking with you today!  1. Take your medicines as prescribed. Try not to miss them. 2. Check your blood sugar daily before you eat breakfast 3. Limit your regular sodas, candies, breads, and sweets 4. You can have sugar free candy and diet soda but water is best for you to drink. 5. You can take 2 tylenol for your arthritis. You can also try warm heat or ice on your knees. 6. Make sure you are not skipping meals. This will make you feel bad. 7. Please try to go to bed before midnight so you can rest during the night and stay awake during the day when you need to take your medications. 8. Follow the CDC guidelines for symptom reporting, social distancing, and hand hygiene  CCM (Chronic Care Management) Team   Yvone Neu RN, BSN Nurse Care Coordinator  [phone]  [name] PharmD  Clinical Pharmacist  [phone]   Chrystal [address] LCSW Clinical Social Worker [phone]  Goals Addressed            This Visit's Progress   . "I want to get better control of my sugar" (pt-stated)   On track     Current Barriers:  [name] Knowledge deficit related to DM self care . Cognition and mental health . Caregiver support and independent living  Nurse Case Manager Clinical Goal(s): Over the next 60 days, patient will engage in DM self care activities such as daily glucometer checks, medication compliance, ADA diet compliance  Interventions:   Discussed importance of checking blood sugars daily  Discussed importance of limiting simple carbs such as cakes, candies, regular sodas, and juice  Discussed importance of eating regular meals  Reviewed importance of drinking water to "flush" extra sugar out of her body.  Discussed utilizing walking as a way to reduce blood sugars  Discussed importance of taking medications every day as scheduled  Provided emotional support and  reassurance related to current pandemic of COVID-19  Discussed current CDC recommendations for social distancing and hand hygiene.    Patient Self Care Activities:  . Take medications as prescribed . Exercise as tolerated . Check blood sugars independently . Does not skip meals . Follow CDC guidelines for symptom reporting, social distancing, and hand hygiene  Please see past updates related to this goal by clicking on the "Past Updates" button in the selected goal             The patient verbalized understanding of instructions provided today and declined a print copy of patient instruction materials.   Telephone follow up appointment with CCM team member scheduled for: 1 month

## 2018-05-10 NOTE — Chronic Care Management (AMB) (Signed)
Care Management   Follow Up Note   05/10/2018 Name: Christy [name] MRN: [id number] DOB: 25-Aug-1955  Referred by: Maple Hudson., MD Reason for referral : Chronic Care Management (follow up DM)    Subjective: "I haven't checked my sugar today but I did take my medicine"   Objective:  Lab Results  Component Value Date   HGBA1C 8.6 (H) 01/12/2018     Assessment: Christy [name] is a 63 year old female patient of Dr. Julieanne Manson who was referred to the CCM program by Dr. Sullivan Lone to address DM management. The CCM Team has continued to follow and assist Christy [name] with her healthcare needs and goals.Today CCMRN CMfollowed up with Christy [name] to assess DM self care compliance, chronic knee pain, and questions/concerns about COVID-19.  Review of patient status, including review of consultants reports, relevant laboratory and other test results, and collaboration with appropriate care team members and the patient's provider was performed as part of comprehensive patient evaluation and provision of chronic care management services.    Goals Addressed            This Visit's Progress   . "I want to get better control of my sugar" (pt-stated)   On track    CCM RN CM monthly check in with Christy [name] for DM plan of care compliance. Christy [name] has not checked her sugar today and admits to not checking "in a while". She has eaten today and been visited by her daily Careers information officer. She continue to be non-adherent to an ADA diet secondary to cognition. She continues to live independently and has family that checks on her often via telephone.  Christy [name] reports current CBG is 191. She has not eaten lunch and had a late breakfast around 10am. She denies missed medications this week. She admits to drinking water and diet sodas. She does not have any questions about COVID-19 infection prevention and states her Medicaid Aide wears a mask with each visit. Christy [name] denies need for food or toiletries. She is  abiding by the stay at home rule.   Current Barriers:  [name] Knowledge deficit related to DM self care . Cognition and mental health . Caregiver support and independent living  Nurse Case Manager Clinical Goal(s): Over the next 60 days, patient will engage in DM self care activities such as daily glucometer checks, medication compliance, ADA diet compliance  Interventions:   Discussed importance of checking blood sugars daily  Discussed importance of limiting simple carbs such as cakes, candies, regular sodas, and juice  Discussed importance of eating regular meals  Reviewed importance of drinking water to "flush" extra sugar out of her body.  Discussed utilizing walking as a way to reduce blood sugars  Discussed importance of taking medications every day as scheduled  Provided emotional support and reassurance related to current pandemic of COVID-19  Discussed current CDC recommendations for social distancing and hand hygiene.    Patient Self Care Activities:  . Take medications as prescribed . Exercise as tolerated . Check blood sugars independently . Does not skip meals . Follow CDC guidelines for symptom reporting, social distancing, and hand hygiene  Please see past updates related to this goal by clicking on the "Past Updates" button in the selected goal              Follow Up Plan: Will follow up in 1 month  Tihanna [name] E. Suzie Portela, RN, BSN Nurse Care Coordinator [facility] Care Management [phone])  [phone]  

## 2018-05-10 NOTE — Chronic Care Management (AMB) (Signed)
  Care Management   Follow Up Note   05/10/2018 Name: Christy [name] MRN: [id number] DOB: 26-Oct-1955  Referred by: Maple Hudson., MD Reason for referral : Chronic Care Management (Pharmacy follow up, COVID)   Christy [name] is a 63 y.o. year old female who is a primary care patient of Maple Hudson., MD. The CCM team was consulted for assistance with chronic disease management and care coordination needs.    Review of patient status, including review of consultants reports, relevant laboratory and other test results, and collaboration with appropriate care team members and the patient's provider was performed as part of comprehensive patient evaluation and provision of chronic care management services.    Goals Addressed            This Visit's Progress   . "I don't want to take so many pills" (pt-stated)       Current Barriers:  . High pill burden . Complex problem list requiring many long term medications  . Difficulty remembering to take medications  Pharmacist Clinical Goal(s): Over the next 90 days, Christy [name] will be adherent to all medications as evidenced by patient report   Update 05/03/18: Christy [name] reports that she has forgotten to take her pills about 1 morning per week out of the past 2 weeks  Interventions: . Patient educated on purpose, proper use and potential adverse effects of all medications. . CCM pharmacist will work with providers to convert Christy [name]'s medications to once daily dosing  . CCM pharmacist will provide updated medication list to all of Christy [name]'s medical providers  Patient Self Care Activities:  . Take all medications as prescribed  Please see past updates related to this goal by clicking on the "Past Updates" button in the selected goal       . I'm worried about this virus  (pt-stated)       Current Barriers:  [name] Knowledge Deficits related to COVID-19 and impact on patient self health management  Clinical Goal(s):  [name] Over the  next 30 days, patient will verbalize basic understanding of COVID-19 impact on individual health and self health management as evidenced by verbalization of basic understanding of COVID-19 as a viral disease, measures to prevent exposure, signs and symptoms, when to contact provider  Interventions: . Provided education to patient re: proper handwashing, infection prevention  Patient Self Care Activities:  Practice good handwashing, social distancing  Initial goal documentation              Telephone follow up appointment with CCM team member scheduled for: PharmD, 30 days telephone follow up for adherence assessment  Christy [name], PharmD Clinical Pharmacist [facility] [phone]

## 2018-05-10 NOTE — Patient Instructions (Signed)
Goals Addressed            This Visit's Progress   . "I don't want to take so many pills" (pt-stated)       Current Barriers:  . High pill burden . Complex problem list requiring many long term medications  . Difficulty remembering to take medications  Pharmacist Clinical Goal(s): Over the next 90 days, Ms. Peron will be adherent to all medications as evidenced by patient report   Update 05/03/18: Audrieanna reports that she has forgotten to take her pills about 1 morning per week out of the past 2 weeks  Interventions: . Patient educated on purpose, proper use and potential adverse effects of all medications. . CCM pharmacist will work with providers to convert Ms. Straughter's medications to once daily dosing  . CCM pharmacist will provide updated medication list to all of Ms. Seymore's medical providers  Patient Self Care Activities:  . Take all medications as prescribed  Please see past updates related to this goal by clicking on the "Past Updates" button in the selected goal       . I'm worried about this virus  (pt-stated)       Current Barriers:  [name] Knowledge Deficits related to COVID-19 and impact on patient self health management  Clinical Goal(s):  [name] Over the next 30 days, patient will verbalize basic understanding of COVID-19 impact on individual health and self health management as evidenced by verbalization of basic understanding of COVID-19 as a viral disease, measures to prevent exposure, signs and symptoms, when to contact provider  Interventions: . Provided education to patient re: proper handwashing, infection prevention  Patient Self Care Activities:  Practice good handwashing, social distancing  Initial goal documentation             The patient verbalized understanding of instructions provided today and declined a print copy of patient instruction materials.

## 2018-05-11 ENCOUNTER — Ambulatory Visit: Payer: Medicaid Other | Admitting: Podiatry

## 2018-05-24 ENCOUNTER — Other Ambulatory Visit: Payer: Self-pay | Admitting: Family Medicine

## 2018-05-24 DIAGNOSIS — A6000 Herpesviral infection of urogenital system, unspecified: Secondary | ICD-10-CM

## 2018-05-24 MED ORDER — VALACYCLOVIR HCL 1 G PO TABS: 1000.0000 mg | ORAL_TABLET | Freq: Every day | ORAL | 3 refills | Status: DC

## 2018-05-24 NOTE — Telephone Encounter (Signed)
[facility] faxed refill request for the following medications:   valACYclovir (VALTREX) 1000 MG tablet    Please advise.

## 2018-06-12 ENCOUNTER — Ambulatory Visit: Payer: Self-pay | Admitting: Family Medicine

## 2018-06-14 ENCOUNTER — Telehealth: Payer: Self-pay

## 2018-06-14 ENCOUNTER — Ambulatory Visit: Payer: Self-pay

## 2018-06-14 NOTE — Chronic Care Management (AMB) (Signed)
    Care Management   Unsuccessful Call Note 06/14/2018 Name: Christy [name] MRN: [id number] DOB: Nov 26, 1955   [name] is a 64 year old female patient of Dr. Julieanne Manson who was referred to the CCM program by Dr. Sullivan Lone to address DM management. The CCM Team has continued to follow and assist [name] with her healthcare needs and goals.Today CCMRN CMfollowed up with [name] to assess DM self care compliance, knee pain, and questions/concerns about COVID-19.  Was unable to reach patient via telephone today for her monthly follow up. I was unable to leave a message as VM has not been set up yet.   Plan: Will follow-up within 7 business days via telephone.    Raenell Mensing E. Suzie Portela, RN, BSN Nurse Care Coordinator [facility] [phone]

## 2018-06-15 ENCOUNTER — Telehealth: Payer: Self-pay

## 2018-06-15 ENCOUNTER — Ambulatory Visit: Payer: Self-pay

## 2018-06-15 DIAGNOSIS — F200 Paranoid schizophrenia: Secondary | ICD-10-CM

## 2018-06-15 DIAGNOSIS — E1140 Type 2 diabetes mellitus with diabetic neuropathy, unspecified: Secondary | ICD-10-CM

## 2018-06-15 DIAGNOSIS — I10 Essential (primary) hypertension: Secondary | ICD-10-CM

## 2018-06-15 NOTE — Chronic Care Management (AMB) (Signed)
    Care Management   Unsuccessful Call Note 06/14/2018 Name: Christy [name] MRN: [id number] DOB: 07/24/1955   Christy [name] is a 62 year old female patient of Dr. Richard Gilbert who was referred to the CCM program by Dr. Gilbert to address DM management. The CCM Team has continued to follow and assist Christy [name] with her healthcare needs and goals.Today CCMRN CMfollowed up with Christy [name] to assess DM self care compliance, knee pain, and questions/concerns about COVID-19.  Was unable to reach patient via telephone today for her monthly follow up. I was unable to leave a message as VM has not been set up yet.   Plan: Will follow-up within 7 business days via telephone.    Elivia Robotham E. Arney Mayabb, RN, BSN Nurse Care Coordinator [facility] Care [facility] [phone]  

## 2018-06-20 ENCOUNTER — Other Ambulatory Visit: Payer: Self-pay

## 2018-06-20 ENCOUNTER — Ambulatory Visit: Payer: Self-pay

## 2018-06-20 DIAGNOSIS — E1140 Type 2 diabetes mellitus with diabetic neuropathy, unspecified: Secondary | ICD-10-CM

## 2018-06-20 DIAGNOSIS — I509 Heart failure, unspecified: Secondary | ICD-10-CM

## 2018-06-20 NOTE — Chronic Care Management (AMB) (Signed)
  Care Management   Follow Up Note   06/20/2018 Name: Christy [name] MRN: [id number] DOB: April 06, 1955  Referred by: Maple Hudson., MD Reason for referral : Chronic Care Management (follow up DM)    Subjective: "I haven't checked my sugars in a while" "I am staying home to make sure I don't get that virus"   Objective:  Lab Results  Component Value Date   HGBA1C 8.6 (H) 01/12/2018   HGBA1C 9.1 (A) 10/03/2017   HGBA1C 11.5 06/07/2017   Lab Results  Component Value Date   LDLCALC 80 01/12/2018   CREATININE 0.76 01/12/2018     Assessment: Christy [name] is a 63 year old female patient of Dr. Julieanne Manson who was referred to the CCM program by Dr. Sullivan Lone to address DM management. The CCM Team has continued to follow and assist Christy [name] with her healthcare needs and goals.Today CCMRN CMfollowed up with Ms. Pinela to assess DM self care compliance, chronic knee pain, and questions/concerns about COVID-19.   Review of patient status, including review of consultants reports, relevant laboratory and other test results, and collaboration with appropriate care team members and the patient's provider was performed as part of comprehensive patient evaluation and provision of chronic care management services.    Goals Addressed            This Visit's Progress   . "I want to get better control of my sugar" (pt-stated)       Christy [name] states she is doing well. She admits to taking her medications "like I am supposed to". She has eaten today which is an improvement for Christy [name]. She states she had fried chicken and pork and beans. She reports not checking her sugars often but willing to check while on the phone with CCM RN CM. CBG 174 today. She continues to remain at home and follow COVID-19 restrictions. States her [employer] worker continues to come daily. Family providing necessities during pandemic.  Current Barriers:  Christy [name] Knowledge deficit related to DM self care . Cognition and  mental health . Caregiver support and independent living  Nurse Case Manager Clinical Goal(s): Over the next 60 days, patient will engage in DM self care activities such as daily glucometer checks, medication compliance, ADA diet compliance  Interventions:   Discussed importance of checking blood sugars daily  Discussed importance of limiting simple carbs such as cakes, candies, regular sodas, and juice  Discussed importance of eating regular meals  Reviewed importance of drinking water to "flush" extra sugar out of her body.  Discussed utilizing walking as a way to reduce blood sugars  Discussed importance of taking medications every day as scheduled  Provided emotional support and reassurance related to current pandemic of COVID-19  Discussed current CDC recommendations for social distancing and hand hygiene.    Patient Self Care Activities:  . Take medications as prescribed . Exercise as tolerated . Check blood sugars independently . Does not skip meals . Follow CDC guidelines for symptom reporting, social distancing, and hand hygiene  Please see past updates related to this goal by clicking on the "Past Updates" button in the selected goal       Follow Up Plan: Will follow up with patient in 30 days  Concetta Guion E. Christy Portela, RN, BSN Nurse Care Coordinator [facility] [phone]

## 2018-06-20 NOTE — Patient Instructions (Signed)
Thank you allowing the Chronic Care Management Team to be a part of your care! It was a pleasure speaking with you today!  1. Take your medicines as prescribed. Try not to miss them. 2. Check your blood sugar dailybefore you eat breakfast 3. Limit your regular sodas, candies, breads, and sweets 4. You can have sugar free candy and diet soda but water is best for you to drink. 5. You can take 2 tylenol for your arthritis. You can also try warm heat or ice on your knees. 6. Make sure you are not skipping meals. This will make you feel bad. 7. Continue to follow the CDC guidelines for symptom reporting, social distancing, and hand hygiene  CCM (Chronic Care Management) Team   Yvone NeuPortia Yoanna Jurczyk RN, BSN Nurse Care Coordinator  [phone]  Karalee HeightJulie Hedrick PharmD  Clinical Pharmacist  [phone]   [address]rystal [address], LCSW Clinical Social Worker [phone]  Goals Addressed            This Visit's Progress   . "I want to get better control of my sugar" (pt-stated)        Current Barriers:  [name]. Knowledge deficit related to DM self care . Cognition and mental health . Caregiver support and independent living  Nurse Case Manager Clinical Goal(s): Over the next 60 days, patient will engage in DM self care activities such as daily glucometer checks, medication compliance, ADA diet compliance  Interventions:   Discussed importance of checking blood sugars daily  Discussed importance of limiting simple carbs such as cakes, candies, regular sodas, and juice  Discussed importance of eating regular meals  Reviewed importance of drinking water to "flush" extra sugar out of her body.  Discussed utilizing walking as a way to reduce blood sugars  Discussed importance of taking medications every day as scheduled  Provided emotional support and reassurance related to current pandemic of COVID-19  Discussed current CDC recommendations for social distancing and hand hygiene.     Patient Self Care Activities:  . Take medications as prescribed . Exercise as tolerated . Check blood sugars independently . Does not skip meals . Follow CDC guidelines for symptom reporting, social distancing, and hand hygiene  Please see past updates related to this goal by clicking on the "Past Updates" button in the selected goal           The patient verbalized understanding of instructions provided today and declined a print copy of patient instruction materials.   Telephone follow up appointment with CCM team member scheduled for: 30 days  SYMPTOMS OF A STROKE   You have any symptoms of stroke. "BE FAST" is an easy way to remember the main warning signs: ? B - Balance. Signs are dizziness, sudden trouble walking, or loss of balance. ? E - Eyes. Signs are trouble seeing or a sudden change in how you see. ? F - Face. Signs are sudden weakness or loss of feeling of the face, or the face or eyelid drooping on one side. ? A - Arms. Signs are weakness or loss of feeling in an arm. This happens suddenly and usually on one side of the body. ? S - Speech. Signs are sudden trouble speaking, slurred speech, or trouble understanding what people say. ? T - Time. Time to call emergency services. Write down what time symptoms started.  You have other signs of stroke, such as: ? A sudden, very bad headache with no known cause. ? Feeling sick to your stomach (nausea). ?  Throwing up (vomiting). ? Jerky movements you cannot control (seizure).  SYMPTOMS OF A HEART ATTACK  What are the signs or symptoms? Symptoms of this condition include:  Chest pain. It may feel like: ? Crushing or squeezing. ? Tightness, pressure, fullness, or heaviness.  Pain in the arm, neck, jaw, back, or upper body.  Shortness of breath.  Heartburn.  Indigestion.  Nausea.  Cold sweats.  Feeling tired.  Sudden lightheadedness.

## 2018-06-29 ENCOUNTER — Ambulatory Visit: Payer: Self-pay | Admitting: Family Medicine

## 2018-07-09 ENCOUNTER — Emergency Department
Admission: EM | Admit: 2018-07-09 | Discharge: 2018-07-09 | Disposition: A | Discharge status: Home / Self Care | Payer: Medicaid Other | Attending: Emergency Medicine | Admitting: Emergency Medicine

## 2018-07-09 ENCOUNTER — Other Ambulatory Visit: Payer: Self-pay

## 2018-07-09 DIAGNOSIS — I509 Heart failure, unspecified: Secondary | ICD-10-CM | POA: Insufficient documentation

## 2018-07-09 DIAGNOSIS — E876 Hypokalemia: Secondary | ICD-10-CM | POA: Insufficient documentation

## 2018-07-09 DIAGNOSIS — R21 Rash and other nonspecific skin eruption: Secondary | ICD-10-CM | POA: Diagnosis present

## 2018-07-09 DIAGNOSIS — F17210 Nicotine dependence, cigarettes, uncomplicated: Secondary | ICD-10-CM | POA: Insufficient documentation

## 2018-07-09 DIAGNOSIS — J45909 Unspecified asthma, uncomplicated: Secondary | ICD-10-CM | POA: Insufficient documentation

## 2018-07-09 DIAGNOSIS — E1122 Type 2 diabetes mellitus with diabetic chronic kidney disease: Secondary | ICD-10-CM | POA: Diagnosis not present

## 2018-07-09 DIAGNOSIS — I130 Hypertensive heart and chronic kidney disease with heart failure and stage 1 through stage 4 chronic kidney disease, or unspecified chronic kidney disease: Secondary | ICD-10-CM | POA: Diagnosis not present

## 2018-07-09 DIAGNOSIS — Z7984 Long term (current) use of oral hypoglycemic drugs: Secondary | ICD-10-CM | POA: Diagnosis not present

## 2018-07-09 DIAGNOSIS — Z79899 Other long term (current) drug therapy: Secondary | ICD-10-CM | POA: Diagnosis not present

## 2018-07-09 DIAGNOSIS — N183 Chronic kidney disease, stage 3 (moderate): Secondary | ICD-10-CM | POA: Diagnosis not present

## 2018-07-09 DIAGNOSIS — Z87891 Personal history of nicotine dependence: Secondary | ICD-10-CM | POA: Insufficient documentation

## 2018-07-09 LAB — HEPATIC FUNCTION PANEL
ALT: 22 U/L (ref 0–44)
AST: 31 U/L (ref 15–41)
Albumin: 4.5 g/dL (ref 3.5–5.0)
Alkaline Phosphatase: 72 U/L (ref 38–126)
Bilirubin, Direct: 0.2 mg/dL (ref 0.0–0.2)
Indirect Bilirubin: 0.8 mg/dL (ref 0.3–0.9)
Total Bilirubin: 1 mg/dL (ref 0.3–1.2)
Total Protein: 8 g/dL (ref 6.5–8.1)

## 2018-07-09 LAB — CBC WITH DIFFERENTIAL/PLATELET
Abs Immature Granulocytes: 0.06 10*3/uL (ref 0.00–0.07)
Basophils Absolute: 0.1 10*3/uL (ref 0.0–0.1)
Basophils Relative: 1 %
Eosinophils Absolute: 0.6 10*3/uL — ABNORMAL HIGH (ref 0.0–0.5)
Eosinophils Relative: 4 %
HCT: 44.2 % (ref 36.0–46.0)
Hemoglobin: 13.7 g/dL (ref 12.0–15.0)
Immature Granulocytes: 1 %
Lymphocytes Relative: 17 %
Lymphs Abs: 2.2 10*3/uL (ref 0.7–4.0)
MCH: 22.3 pg — ABNORMAL LOW (ref 26.0–34.0)
MCHC: 31 g/dL (ref 30.0–36.0)
MCV: 71.9 fL — ABNORMAL LOW (ref 80.0–100.0)
Monocytes Absolute: 1.5 10*3/uL — ABNORMAL HIGH (ref 0.1–1.0)
Monocytes Relative: 11 %
Neutro Abs: 8.7 10*3/uL — ABNORMAL HIGH (ref 1.7–7.7)
Neutrophils Relative %: 66 %
Platelets: 283 10*3/uL (ref 150–400)
RBC: 6.15 MIL/uL — ABNORMAL HIGH (ref 3.87–5.11)
RDW: 16.2 % — ABNORMAL HIGH (ref 11.5–15.5)
WBC: 13.1 10*3/uL — ABNORMAL HIGH (ref 4.0–10.5)
nRBC: 0 % (ref 0.0–0.2)

## 2018-07-09 LAB — BASIC METABOLIC PANEL
Anion gap: 19 — ABNORMAL HIGH (ref 5–15)
Anion gap: 14 (ref 5–15)
BUN: 15 mg/dL (ref 8–23)
BUN: 12 mg/dL (ref 8–23)
CO2: 30 mmol/L (ref 22–32)
CO2: 32 mmol/L (ref 22–32)
Calcium: 9.6 mg/dL (ref 8.9–10.3)
Calcium: 9 mg/dL (ref 8.9–10.3)
Chloride: 84 mmol/L — ABNORMAL LOW (ref 98–111)
Chloride: 90 mmol/L — ABNORMAL LOW (ref 98–111)
Creatinine, Ser: 1.25 mg/dL — ABNORMAL HIGH (ref 0.44–1.00)
Creatinine, Ser: 0.9 mg/dL (ref 0.44–1.00)
GFR calc Af Amer: 53 mL/min — ABNORMAL LOW (ref >60)
GFR calc Af Amer: >60 mL/min (ref >60)
GFR calc non Af Amer: 45 mL/min — ABNORMAL LOW (ref >60)
GFR calc non Af Amer: >60 mL/min (ref >60)
Glucose, Bld: 234 mg/dL — ABNORMAL HIGH (ref 70–99)
Glucose, Bld: 166 mg/dL — ABNORMAL HIGH (ref 70–99)
Potassium: 2.5 mmol/L — CL (ref 3.5–5.1)
Potassium: 2.7 mmol/L — CL (ref 3.5–5.1)
Sodium: 133 mmol/L — ABNORMAL LOW (ref 135–145)
Sodium: 136 mmol/L (ref 135–145)

## 2018-07-09 LAB — MAGNESIUM: Magnesium: 1.9 mg/dL (ref 1.7–2.4)

## 2018-07-09 MED ORDER — POTASSIUM CHLORIDE CRYS ER 20 MEQ PO TBCR
40.0000 meq | EXTENDED_RELEASE_TABLET | Freq: Once | ORAL | Status: AC
  Administered 2018-07-09: 40 meq via ORAL
  Filled 2018-07-09: qty 2

## 2018-07-09 MED ORDER — POTASSIUM CHLORIDE 10 MEQ/100ML IV SOLN
10.0000 meq | Freq: Once | INTRAVENOUS | Status: AC
  Administered 2018-07-09: 15:00:00 10 meq via INTRAVENOUS
  Filled 2018-07-09: qty 100

## 2018-07-09 MED ORDER — HYDROXYZINE HCL 25 MG PO TABS: 25.0000 mg | ORAL_TABLET | Freq: Three times a day (TID) | ORAL | 0 refills | Status: DC | PRN

## 2018-07-09 MED ORDER — DIPHENHYDRAMINE HCL 25 MG PO CAPS
25.0000 mg | ORAL_CAPSULE | Freq: Once | ORAL | Status: AC
  Administered 2018-07-09: 07:00:00 25 mg via ORAL
  Filled 2018-07-09: qty 1

## 2018-07-09 MED ORDER — MAGNESIUM SULFATE 2 GM/50ML IV SOLN
2.0000 g | Freq: Once | INTRAVENOUS | Status: AC
  Administered 2018-07-09: 2 g via INTRAVENOUS
  Filled 2018-07-09: qty 50

## 2018-07-09 MED ORDER — POTASSIUM CHLORIDE 10 MEQ/100ML IV SOLN
10.0000 meq | INTRAVENOUS | Status: AC
  Administered 2018-07-09 (×2): 10 meq via INTRAVENOUS
  Filled 2018-07-09 (×3): qty 100

## 2018-07-09 MED ORDER — TRIAMCINOLONE ACETONIDE 0.1 % EX LOTN: 1.0000 "application " | TOPICAL_LOTION | Freq: Three times a day (TID) | CUTANEOUS | 0 refills | Status: DC

## 2018-07-09 MED ORDER — TRIAMCINOLONE ACETONIDE 0.5 % EX CREA
TOPICAL_CREAM | Freq: Two times a day (BID) | CUTANEOUS | Status: DC
  Administered 2018-07-09: 12:00:00 via TOPICAL
  Filled 2018-07-09: qty 15

## 2018-07-09 MED ORDER — POTASSIUM CHLORIDE CRYS ER 20 MEQ PO TBCR: 40.0000 meq | EXTENDED_RELEASE_TABLET | Freq: Two times a day (BID) | ORAL | 0 refills | Status: DC

## 2018-07-09 MED ORDER — SODIUM CHLORIDE 0.9 % IV SOLN
Freq: Once | INTRAVENOUS | Status: AC
  Administered 2018-07-09: 11:00:00 via INTRAVENOUS

## 2018-07-09 NOTE — ED Notes (Signed)
Pt up to restroom.

## 2018-07-09 NOTE — ED Provider Notes (Signed)
  Physical Exam  BP 114/63 (BP Location: Left Arm)   Pulse 75   Temp 98.1 F (36.7 C)   Resp 18   Ht 5' 9.5" (1.765 m)   Wt 127 kg   SpO2 96%   BMI 40.76 kg/m   Physical Exam  ED Course/Procedures     Procedures  MDM   Patient's potassium came up to 2.7 after 30 mEq IV potassium and 40 mEq p.o. Reviewed patient case with attending Dr. Kerman Passey.  We agreed to treat patient with 40 mEq oral potassium twice daily for the next 7 days.  Instructions were reviewed with patient and she voiced understanding. All patient questions were answered.        Vallarie Mare [place], PA-C 07/09/18 1714    Harvest Dark, MD 07/09/18 [phone]

## 2018-07-09 NOTE — ED Triage Notes (Signed)
Pt with blister like rash noted to posterior left mid leg. Pt states area itches. Pt states has been present for a week. Pt appears in no acute distress.

## 2018-07-09 NOTE — ED Notes (Signed)
Pt take to car by this RN to review discharge info with caregiver.

## 2018-07-09 NOTE — ED Provider Notes (Signed)
[facility] Emergency Department Provider Note  ____________________________________________   First MD Initiated Contact with Patient 07/09/18 0715     (approximate)  I have reviewed the triage vital signs and the nursing notes.   HISTORY  Chief Complaint Rash   HPI Matilde BashMinnie B Wierman is a 63 y.o. female presents to the ED with complaint of rash to her left lower leg for approximately 1 week.  Patient states is been itching and she has been scratching a great deal.  She has not used any over-the-counter medication for this.  She denies any previous rashes.  She denies any exposure to allergens such as poison oak or poison ivy.       Past Medical History:  Diagnosis Date  . Arthritis   . Asthma   . Diabetes mellitus without complication [facility](HCC)    Patient takes Metformin  . Edema   . Genital herpes   . GERD (gastroesophageal reflux disease)   . Hyperlipidemia   . Hypertension   . Malaise and fatigue   . Mental status alteration   . Schizophrenia [facility](HCC)     Patient Active Problem List   Diagnosis Date Noted  . Schizophrenia (HCC) 01/16/2018  . CKD (chronic kidney disease), stage III (HCC) 06/07/2017  . Controlled type 2 diabetes mellitus without complication, without long-term current use of insulin (HCC) 04/19/2016  . Status post abdominal hysterectomy 05/01/2015  . Morbid obesity with body mass index (BMI) of 45.0 to 49.9 in adult [facility](HCC) 05/01/2015  . Acute respiratory failure (HCC) 04/28/2015  . Allergic rhinitis 08/02/2014  . Arthropathia 08/02/2014  . CCF (congestive cardiac failure) (HCC) 08/02/2014  . Current tobacco use 08/02/2014  . Diabetic neuropathy (HCC) 08/02/2014  . Genital herpes 08/02/2014  . Acid reflux 08/02/2014  . BP (high blood pressure) 08/02/2014  . HLD (hyperlipidemia) 08/02/2014  . Incontinence 08/02/2014  . Neuropathy 08/02/2014  . Drug noncompliance 08/02/2014  . Adult BMI 30+ 08/02/2014  . Arthritis, degenerative  08/02/2014  . Delusional disorder (HCC) 08/02/2014  . Avitaminosis D 08/02/2014  . Can't get food down 07/16/2013    Past Surgical History:  Procedure Laterality Date  . ABDOMINAL HYSTERECTOMY     due to fibroids in 1997  . CHOLECYSTECTOMY    . COLONOSCOPY WITH PROPOFOL N/A 08/15/2015   Procedure: COLONOSCOPY WITH PROPOFOL;  Surgeon: Christena DeemMartin U Skulskie, MD;  [facility] END[facility]COPY;  Service: Endoscopy;  Laterality: N/A;  . ESOPHAGOGASTRODUODENOSCOPY (EGD) WITH PROPOFOL N/A 08/15/2015   Procedure: ESOPHAGOGASTRODUODENOSCOPY (EGD) WITH PROPOFOL;  Surgeon: Christena DeemMartin U Skulskie, MD;  [facility]COPY;  Service: Endoscopy;  Laterality: N/A;  . FOOT SURGERY Right   . GANGLION CYST EXCISION    . TONSILLECTOMY    . UPPER GI ENDOSCOPY  07/31/13   mild chronic inflammation and reactive gastropathy-no need for another EGD repeat    Prior to Admission medications   Medication Sig Start Date End Date Taking? Authorizing Provider  ACCU-CHEK SOFTCLIX LANCETS lancets Check sugar once daily  DX E11.9 01/10/17   Maple HudsonGilbert, Richard L Jr., MD  Amantadine HCl 100 MG tablet Take 1 tablet (100 mg total) by mouth 2 (two) times daily. 11/17/16   Maple HudsonGilbert, Richard L Jr., MD  aspirin EC 81 MG tablet Take 81 mg by mouth daily.    [provider]  dicyclomine (BENTYL) 10 MG capsule Take 1 capsule (10 mg total) by mouth 2 (two) times daily. 09/09/17 09/09/18  Maple HudsonGilbert, Richard L Jr., MD  docusate sodium (COLACE) 100 MG  capsule Take 1 capsule (100 mg total) by mouth daily. Patient taking differently: Take 200 mg by mouth daily.  11/17/16   Maple HudsonGilbert, Richard L Jr., MD  fluPHENAZine (PROLIXIN) 2.5 MG/ML injection Inject into the muscle.    [provider]  fluticasone (FLONASE) 50 MCG/ACT nasal spray Place 2 sprays into both nostrils daily as needed for allergies. 11/07/17   Maple HudsonGilbert, Richard L Jr., MD  furosemide (LASIX) 40 MG tablet Take 1 tablet (40 mg total) by mouth 2 (two) times daily. 04/28/18    Maple HudsonGilbert, Richard L Jr., MD  glimepiride (AMARYL) 4 MG tablet Two tablets daily with breakfast 11/23/17   Maple HudsonGilbert, Richard L Jr., MD  glucose blood (ACCU-CHEK AVIVA) test strip Check sugar once daily DX E11.9 12/21/16   Maple HudsonGilbert, Richard L Jr., MD  hydrOXYzine (ATARAX/VISTARIL) 25 MG tablet Take 1 tablet (25 mg total) by mouth every 8 (eight) hours as needed for itching. 07/09/18   Tommi RumpsSummers, Rhonda L, PA-C  Lancet Devices [facility](ACCU-CHEK SOFTCLIX) lancets Check sugar once daily, DX E11.9 08/13/15   Maple HudsonGilbert, Richard L Jr., MD  loratadine (CLARITIN) 10 MG tablet Take 1 tablet (10 mg total) by mouth daily. 08/08/17   Maple HudsonGilbert, Richard L Jr., MD  meloxicam (MOBIC) 15 MG tablet Take 15 mg by mouth daily.    [provider]  metFORMIN (GLUCOPHAGE) 1000 MG tablet Take 1 tablet (1,000 mg total) by mouth 2 (two) times daily with a meal. 06/07/17   Anola Gurneyhauvin, Robert, PA  metoprolol succinate (TOPROL-XL) 25 MG 24 hr tablet Take 0.5 tablets (12.5 mg total) by mouth daily. 11/10/17   Maple HudsonGilbert, Richard L Jr., MD  ondansetron (ZOFRAN) 4 MG tablet Take 1 tablet (4 mg total) by mouth every 8 (eight) hours as needed for nausea or vomiting. 06/22/16   Maple HudsonGilbert, Richard L Jr., MD  pantoprazole (PROTONIX) 40 MG tablet Take 40 mg by mouth daily.    [provider]  simvastatin (ZOCOR) 10 MG tablet Take 1 tablet (10 mg total) by mouth daily. 01/20/18   Maple HudsonGilbert, Richard L Jr., MD  triamcinolone lotion (KENALOG) 0.1 % Apply 1 application topically 3 (three) times daily. Prn itching 07/09/18   Tommi RumpsSummers, Rhonda L, PA-C  triamterene-hydrochlorothiazide (MAXZIDE-25) 37.5-25 MG tablet Take 1 tablet by mouth daily. 07/25/17   Maple HudsonGilbert, Richard L Jr., MD  trihexyphenidyl (ARTANE) 2 MG tablet Take 2 mg by mouth daily.    Oletta DarterAgarwal, [place], MD  valACYclovir (VALTREX) 1000 MG tablet Take 1 tablet (1,000 mg total) by mouth daily. 05/24/18   Maple HudsonGilbert, Richard L Jr., MD    Allergies Benzoyl peroxide, Cephalexin, Peroxide [hydrogen peroxide],  and Shellfish allergy  Family History  Problem Relation Age of Onset  . Diabetes Mother   . Pulmonary embolism Mother   . Diabetes Father     Social History Social History   Tobacco Use  . Smoking status: Current Some Day Smoker    Packs/day: 0.00    Types: Cigarettes  . Smokeless tobacco: Never Used  . Tobacco comment: only smokes sometimes  Substance Use Topics  . Alcohol use: Yes    Comment: beer occasionally  . Drug use: No    Review of Systems Constitutional: No fever/chills Cardiovascular: Denies chest pain. Respiratory: Denies shortness of breath. Gastrointestinal: No abdominal pain.  No nausea, no vomiting.  No diarrhea.   Genitourinary: Negative for dysuria.  Musculoskeletal: Negative for muscle skeletal pain. Skin: Positive for rash left lower leg. Neurological: Negative for headaches, focal weakness or numbness. Psychiatric:  History of  schizophrenia. Endocrine:  History type 2 diabetes Allergic/Immunilogical: Shellfish allergy, cephalexin, peroxide  ____________________________________________   PHYSICAL EXAM:  VITAL SIGNS: ED Triage Vitals  Enc Vitals Group     BP 07/09/18 0300 118/69     Pulse Rate 07/09/18 0300 90     Resp 07/09/18 0300 16     Temp 07/09/18 0300 98.1 F (36.7 C)     Temp src --      SpO2 07/09/18 0300 100 %     Weight 07/09/18 0257 280 lb (127 kg)     Height 07/09/18 0257 5' 9.5" (1.765 m)     Head Circumference --      Peak Flow --      Pain Score --      Pain Loc --      Pain Edu? --      Excl. in [place]? --    Constitutional: Alert and oriented. Well appearing and in no acute distress. Eyes: Conjunctivae are normal. PERRL. EOMI. Head: Atraumatic. Nose: No congestion/rhinnorhea. Mouth/Throat: Mucous membranes are moist.   Neck: No stridor.   Cardiovascular: Normal rate, regular rhythm. Grossly normal heart sounds.  Good peripheral circulation. Respiratory: Normal respiratory effort.  No retractions. Lungs CTAB.  Gastrointestinal: Soft and nontender. No distention. Musculoskeletal: Moves upper and lower extremities without any difficulty.  Patient is ambulatory with minimal assistance.  There is no point tenderness on palpation of the upper or lower extremities.  No evidence of injury to the skin. Neurologic:  Normal speech and language. No gross focal neurologic deficits are appreciated. No gait instability. Skin:  Skin is warm, dry and intact.  Left lower leg has an isolated papular rash on the medial aspect.  Area is flesh-colored however there is erythema surrounding this area either due to possible cellulitis versus patient scratching at it.  Skin appears to be intact.   Psychiatric: Mood and affect are normal. Speech and behavior are normal.  ____________________________________________   LABS (all labs ordered are listed, but only abnormal results are displayed)  Labs Reviewed  CBC WITH DIFFERENTIAL/PLATELET - Abnormal; Notable for the following components:      Result Value   WBC 13.1 (*)    RBC 6.15 (*)    MCV 71.9 (*)    MCH 22.3 (*)    RDW 16.2 (*)    Neutro Abs 8.7 (*)    Monocytes Absolute 1.5 (*)    Eosinophils Absolute 0.6 (*)    All other components within normal limits  BASIC METABOLIC PANEL - Abnormal; Notable for the following components:   Sodium 133 (*)    Potassium 2.5 (*)    Chloride 84 (*)    Glucose, Bld 234 (*)    Creatinine, Ser 1.25 (*)    GFR calc non Af Amer 45 (*)    GFR calc Af Amer 53 (*)    Anion gap 19 (*)    All other components within normal limits  MAGNESIUM  HEPATIC FUNCTION PANEL  BASIC METABOLIC PANEL   ____________________________________________  EKG Initial EKG was reviewed by Dr. Ellender Hose ____________________________________________  PROCEDURES  Procedure(s) performed (including Critical Care):  Procedures   ____________________________________________   INITIAL IMPRESSION / ASSESSMENT AND PLAN / ED COURSE  As part of my  medical decision making, I reviewed the following data within the electronic MEDICAL RECORD NUMBER Notes from prior ED visits and [place] Controlled Substance Database  63 year old female presents to the ED with complaint of a rash to her left leg that she  has been scratching.  Patient denies any allergens or exposure to poison oak or poison ivy.  While being evaluated it was determined that patient was hypokalemic with a potassium of 2.5.  She also has had difficulties in the past with the last time being in 2018.  Oral potassium as well as IV potassium and magnesium was given to the patient and she was monitored during her stay.  Dr. Erma HeritageIsaacs was also in to see the patient.  Repeat labs and EKG was done prior to discharge.  Patient agrees to follow-up with Dr. Sullivan LoneGilbert both for her hypokalemia and rash.  She was discharged with a prescription for Atarax 25 mg every 8 hours as needed for itching and Kenalog cream to the applied to the area as needed 3 times daily.  She is to follow-up with [facility] if not improving.  ____________________________________________   FINAL CLINICAL IMPRESSION(S) / ED DIAGNOSES  Final diagnoses:  Rash and nonspecific skin eruption  Hypokalemia     ED Discharge Orders         Ordered    triamcinolone lotion (KENALOG) 0.1 %  3 times daily     07/09/18 1531    hydrOXYzine (ATARAX/VISTARIL) 25 MG tablet  Every 8 hours PRN     07/09/18 1531           Note:  This document was prepared using Dragon voice recognition software and may include unintentional dictation errors.    Tommi RumpsSummers, Rhonda L, PA-C 07/13/18 1719    Shaune PollackIsaacs, Cameron, MD 07/14/18 [phone]

## 2018-07-09 NOTE — ED Notes (Signed)
Pt contact person and caregiver- Vita Barley  [phone]

## 2018-07-09 NOTE — ED Notes (Signed)
Pt verbalized understanding of discharge instructions. NAD at this time. 

## 2018-07-09 NOTE — ED Notes (Signed)
Pt waiting in lobby in no acute distress, drinking soda.

## 2018-07-10 ENCOUNTER — Telehealth: Payer: Self-pay | Admitting: Family Medicine

## 2018-07-10 ENCOUNTER — Ambulatory Visit: Payer: Self-pay

## 2018-07-10 ENCOUNTER — Ambulatory Visit (INDEPENDENT_AMBULATORY_CARE_PROVIDER_SITE_OTHER): Payer: Medicaid Other | Admitting: Family Medicine

## 2018-07-10 ENCOUNTER — Encounter: Payer: Self-pay | Admitting: Family Medicine

## 2018-07-10 ENCOUNTER — Other Ambulatory Visit: Payer: Self-pay

## 2018-07-10 VITALS — BP 130/76 | HR 76 | Temp 98.0°F | Wt 273.0 lb

## 2018-07-10 DIAGNOSIS — I509 Heart failure, unspecified: Secondary | ICD-10-CM

## 2018-07-10 DIAGNOSIS — R21 Rash and other nonspecific skin eruption: Secondary | ICD-10-CM

## 2018-07-10 DIAGNOSIS — Z9114 Patient's other noncompliance with medication regimen: Secondary | ICD-10-CM

## 2018-07-10 NOTE — Telephone Encounter (Signed)
Christy [name] with [facility] wants to talk to you about placing Christena in a group home as per the wishes of POAs.

## 2018-07-10 NOTE — Chronic Care Management (AMB) (Signed)
Care Management   Follow Up Note   07/10/2018 Name: Christy [name] MRN: [id number] DOB: Nov 30, 1955  Referred by: Maple HudsonGilbert, Richard L Jr., MD Reason for referral : Chronic Care Management (incoming call related to ED visit)    Subjective: per POA Rosalene BillingsAngela [name] "Minnnie went to the ED Saturday night and they said she needs to see a skin doctor for the blisters on her legs" "Christy [name] is not taking any of her evening medicines and I think it is time we look for her another place to live where she can be supervised"   Objective:   Assessment: [name] is a 63 year old female patient of Dr. Julieanne Mansonichard Gilbert who was referred to the CCM program by Dr. Sullivan LoneGilbert to address DM management. The CCM Team has continued to follow and assist [name] with her healthcare needs and goals. Today RN CM received an incoming call from patients POA to discuss patient needs and recent ED visit.  Review of patient status, including review of consultants reports, relevant laboratory and other test results, and collaboration with appropriate care team members and the patient's provider was performed as part of comprehensive patient evaluation and provision of chronic care management services.    Goals Addressed            This Visit's Progress    Per POA Rosalene BillingsAngela [name] "Minna AntisMinnie [name] is not taking care of herself or taking her medications" (pt-stated)       [name] informed RN CM that patient has visited the ED this weekend. States patients potassium was "really low" and patient also had a blistering rash on her legs that needed follow up by a "skin doctor". [name] was initially requesting that RN CM make patient appointment with dermatologist. Informed [name] patient would need to be follow up with by PCP prior to referral. [name] concerned that Christy [name] is no longer capable of caring for herself without assistance. [name] is currently living in a low income housing development and has not had AC  for over a month. She has a daily Careers information officermedicaid worker, but this Aide is not reminding Christy [name] to take her medications. Ms Everardo Allllison states another family member goes to the home weekly to provide food and notices Christy [name] is not taking her evening pills out of the bubble pack. [name] is not checking her sugars. She is not bathing on a regular basis, even though the Aide is there daily in the morning hours, as Christy [name] likes to sleep all day.  Current Barriers:   Lacks caregiver support.   Literacy barriers  Non-adherence to prescribed medication regimen  Cognitive Deficits  Nurse Case Manager Clinical Goal(s):   Over the next 14 days, patient's family/POAs will work with CCM clinical social worker to received groups homes list for possible patient placement secondary to patients lack or self care and medication management  Interventions:   Collaborated with PCP regarding patients current health status, ED visits, family concerns, and desires for a more appropriate level of care for patient.  Social Work referral for group home/level of care options  Facilitated PCP visit-Dennis Chrismon PA 3:00 today for recent ED visit (near syncope, hypokalemia, blistering rash lower extremities)  Reviewed recent ED notes and discharge AVS  Patient Self Care Activities:   Currently UNABLE TO independently take medications, care for self without prompting  Initial goal documentation         Follow Up Plan: Will refer to LCSW for  custodial care options. Will continue to follow Miraya via EMR and follow up telephonically next week.   Ikhlas Albo E. Rollene Rotunda, RN, BSN Nurse Care Coordinator [facility] Care Management [phone]

## 2018-07-10 NOTE — Progress Notes (Signed)
Christy [name]  MRN: [id number] DOB: Dec 02, 1955  Subjective:  HPI   The patient is a 63 year old female who presents for follow up of rash on her legs.  She states that yesterday early morning she was seen on the ER for the rash and itching.    Patient Active Problem List   Diagnosis Date Noted   Schizophrenia (HCC) 01/16/2018   CKD (chronic kidney disease), stage III (HCC) 06/07/2017   Controlled type 2 diabetes mellitus without complication, without long-term current use of insulin (HCC) 04/19/2016   Status post abdominal hysterectomy 05/01/2015   Morbid obesity with body mass index (BMI) of 45.0 to 49.9 in adult [facility](HCC) 05/01/2015   Acute respiratory failure (HCC) 04/28/2015   Allergic rhinitis 08/02/2014   Arthropathia 08/02/2014   CCF (congestive cardiac failure) (HCC) 08/02/2014   Current tobacco use 08/02/2014   Diabetic neuropathy (HCC) 08/02/2014   Genital herpes 08/02/2014   Acid reflux 08/02/2014   BP (high blood pressure) 08/02/2014   HLD (hyperlipidemia) 08/02/2014   Incontinence 08/02/2014   Neuropathy 08/02/2014   Drug noncompliance 08/02/2014   Adult BMI 30+ 08/02/2014   Arthritis, degenerative 08/02/2014   Delusional disorder (HCC) 08/02/2014   Avitaminosis D 08/02/2014   Can't get food down 07/16/2013    Past Medical History:  Diagnosis Date   Arthritis    Asthma    Diabetes mellitus without complication (HCC)    Patient takes Metformin   Edema    Genital herpes    GERD (gastroesophageal reflux disease)    Hyperlipidemia    Hypertension    Malaise and fatigue    Mental status alteration    Schizophrenia (HCC)    Past Surgical History:  Procedure Laterality Date   ABDOMINAL HYSTERECTOMY     due to fibroids in 1997   CHOLECYSTECTOMY     COLONOSCOPY WITH PROPOFOL N/A 08/15/2015   Procedure: COLONOSCOPY WITH PROPOFOL;  Surgeon: Christena DeemMartin U Skulskie, MD;  [facility] END[facility]COPY;  Service: Endoscopy;  Laterality:  N/A;   ESOPHAGOGASTRODUODENOSCOPY (EGD) WITH PROPOFOL N/A 08/15/2015   Procedure: ESOPHAGOGASTRODUODENOSCOPY (EGD) WITH PROPOFOL;  Surgeon: Christena DeemMartin U Skulskie, MD;  [facility];  Service: Endoscopy;  Laterality: N/A;   FOOT SURGERY Right    GANGLION CYST EXCISION     TONSILLECTOMY     UPPER GI ENDOSCOPY  07/31/13   mild chronic inflammation and reactive gastropathy-no need for another EGD repeat   Family History  Problem Relation Age of Onset   Diabetes Mother    Pulmonary embolism Mother    Diabetes Father    Social History   Socioeconomic History   Marital status: Single    Spouse name: Not on file   Number of children: Not on file   Years of education: Not on file   Highest education level: Not on file  Occupational History   Not on file  Social Needs   Financial resource strain: Not on file   Food insecurity    Worry: Not on file    Inability: Not on file   Transportation needs    Medical: Not on file    Non-medical: Not on file  Tobacco Use   Smoking status: Current Some Day Smoker    Packs/day: 0.00    Types: Cigarettes   Smokeless tobacco: Never Used   Tobacco comment: only smokes sometimes  Substance and Sexual Activity   Alcohol use: Yes    Comment: beer occasionally   Drug use: No   Sexual  activity: Yes  Lifestyle   Physical activity    Days per week: Not on file    Minutes per session: Not on file   Stress: Not on file  Relationships   Social connections    Talks on phone: Not on file    Gets together: Not on file    Attends religious service: Not on file    Active member of club or organization: Not on file    Attends meetings of clubs or organizations: Not on file    Relationship status: Not on file   Intimate partner violence    Fear of current or ex partner: Not on file    Emotionally abused: Not on file    Physically abused: Not on file    Forced sexual activity: Not on file  Other Topics Concern   Not  on file  Social History Narrative   Not on file    Outpatient Encounter Medications as of 07/10/2018  Medication Sig Note   ACCU-CHEK SOFTCLIX LANCETS lancets Check sugar once daily  DX E11.9    Amantadine HCl 100 MG tablet Take 1 tablet (100 mg total) by mouth 2 (two) times daily.    aspirin EC 81 MG tablet Take 81 mg by mouth daily.    dicyclomine (BENTYL) 10 MG capsule Take 1 capsule (10 mg total) by mouth 2 (two) times daily.    docusate sodium (COLACE) 100 MG capsule Take 1 capsule (100 mg total) by mouth daily. (Patient taking differently: Take 200 mg by mouth daily. )    fluPHENAZine (PROLIXIN) 2.5 MG/ML injection Inject into the muscle.    fluticasone (FLONASE) 50 MCG/ACT nasal spray Place 2 sprays into both nostrils daily as needed for allergies.    furosemide (LASIX) 40 MG tablet Take 1 tablet (40 mg total) by mouth 2 (two) times daily.    glimepiride (AMARYL) 4 MG tablet Two tablets daily with breakfast    glucose blood (ACCU-CHEK AVIVA) test strip Check sugar once daily DX E11.9    hydrOXYzine (ATARAX/VISTARIL) 25 MG tablet Take 1 tablet (25 mg total) by mouth every 8 (eight) hours as needed for itching.    Lancet Devices (ACCU-CHEK SOFTCLIX) lancets Check sugar once daily, DX E11.9    loratadine (CLARITIN) 10 MG tablet Take 1 tablet (10 mg total) by mouth daily.    meloxicam (MOBIC) 15 MG tablet Take 15 mg by mouth daily.    metFORMIN (GLUCOPHAGE) 1000 MG tablet Take 1 tablet (1,000 mg total) by mouth 2 (two) times daily with a meal.    metoprolol succinate (TOPROL-XL) 25 MG 24 hr tablet Take 0.5 tablets (12.5 mg total) by mouth daily.    ondansetron (ZOFRAN) 4 MG tablet Take 1 tablet (4 mg total) by mouth every 8 (eight) hours as needed for nausea or vomiting. 02/02/2018: Needs refill   pantoprazole (PROTONIX) 40 MG tablet Take 40 mg by mouth daily.    potassium chloride SA (K-DUR) 20 MEQ tablet Take 2 tablets (40 mEq total) by mouth 2 (two) times daily for 7  days.    simvastatin (ZOCOR) 10 MG tablet Take 1 tablet (10 mg total) by mouth daily.    triamcinolone lotion (KENALOG) 0.1 % Apply 1 application topically 3 (three) times daily. Prn itching    triamterene-hydrochlorothiazide (MAXZIDE-25) 37.5-25 MG tablet Take 1 tablet by mouth daily.    trihexyphenidyl (ARTANE) 2 MG tablet Take 2 mg by mouth daily.    valACYclovir (VALTREX) 1000 MG tablet Take 1 tablet (1,000  mg total) by mouth daily.    No facility-administered encounter medications on file as of 07/10/2018.     Allergies  Allergen Reactions   Benzoyl Peroxide    Cephalexin Itching   Peroxide [Hydrogen Peroxide] Swelling   Shellfish Allergy Nausea And Vomiting   ROS  Objective:  BP 130/76 (BP Location: Right Arm, Patient Position: Sitting, Cuff Size: Large)    Pulse 76    Temp 98 F (36.7 C) (Oral)    Wt 273 lb (123.8 kg)    SpO2 95%    BMI 39.74 kg/m   Physical Exam  Constitutional: She is oriented to person, place, and time and well-developed, well-nourished, and in no distress.  HENT:  Head: Normocephalic.  Eyes: Conjunctivae are normal.  Neck: Neck supple.  Pulmonary/Chest: Effort normal.  Abdominal: Soft.  Musculoskeletal: Normal range of motion.  Neurological: She is alert and oriented to person, place, and time.  Skin: No rash noted.  Psychiatric: Mood, affect and judgment normal.    Assessment and Plan :   1. Rash and nonspecific skin eruption Onset of an itchy rash with some excoriations and one 1 cm blister on the left posterior calf. Caregiver requesting dermatology referral in case the Triamcinolone does not clear this up easily. Continue gauze wrap over a non-stick pad after washing with soap and water then applying triamcinolone cream sparingly. Scheduled dermatology referral. - Ambulatory referral to Dermatology

## 2018-07-11 ENCOUNTER — Ambulatory Visit: Payer: Self-pay

## 2018-07-11 DIAGNOSIS — Z9114 Patient's other noncompliance with medication regimen: Secondary | ICD-10-CM

## 2018-07-11 DIAGNOSIS — I509 Heart failure, unspecified: Secondary | ICD-10-CM

## 2018-07-11 DIAGNOSIS — E1140 Type 2 diabetes mellitus with diabetic neuropathy, unspecified: Secondary | ICD-10-CM

## 2018-07-11 NOTE — Chronic Care Management (AMB) (Signed)
  Care Management   Follow Up Note   07/11/2018 Name: Christy [name] MRN: [id number] DOB: February 17, 1955  Referred by: Jerrol Banana., MD Reason for referral : Chronic Care Management (incoming call from [place])    Subjective: per Christy [name] "I/Mane is now engaged with [facility] Case Management Program and the case manager will assist [place] with moving Alica to a group home or Assisted Living"   Objective:   Assessment: Christy [name] is a 63 year old female patient of Dr. Miguel Aschoff who was referred to the CCM program by Dr. Rosanna Randy to address DM management. The CCM Team has continued to follow and assist Christy [name] with her healthcare needs and goals. Today RN CM received an incoming call from patients [place] to say she/patient is now engaged with [facility]. [name] states Christy [name] now has a case manager, pharmacist, and access to a mental health counselor through the program. States CM is going to assist with patient placement to a higher level of care. Also states she has had a conversation with Christy [name], who is excited that she will have people she can socialize with when she moves into a new residence.  Review of patient status, including review of consultants reports, relevant laboratory and other test results, and collaboration with appropriate care team members and the patient's provider was performed as part of comprehensive patient evaluation and provision of chronic care management services.    Goals Addressed            This Visit's Progress   . Per POA Christy [name] "Christy [name] is not taking care of herself or taking her medications" (pt-stated)       Current Barriers:  . Lacks caregiver support.  . Literacy barriers . Non-adherence to prescribed medication regimen . Cognitive Deficits  Nurse Case Manager Clinical Goal(s):  Christy [name] Over the next 14 days, patient's family/POAs will work with CCM  clinical social worker to received groups homes list for possible patient placement secondary to patients lack or self care and medication management  Interventions:  . Collaborated with PCP regarding patients current health status, ED visits, family concerns, and desires for a more appropriate level of care for patient. . Social Work referral for group home/level of care options . Facilitated PCP visit-Christy Chrismon PA 3:00 today for recent ED visit (near syncope, hypokalemia, blistering rash lower extremities) . Reviewed recent ED notes and discharge AVS . Collaborated with patient's pharmacy to confirm patient is not taking medications out of bubble pack. Christy [name] Collaborated with Christy [name] related to medication compliance. .   Patient Self Care Activities:  . Currently UNABLE TO independently take medications, care for self without prompting  Initial goal documentation         Follow Up Plan: CCM Team will continue to remain available for assistance with patient placement as needed however [facility] has assumed patients case management.   Kennidee Heyne E. Rollene Rotunda, RN, BSN Nurse Care Coordinator [facility] Practice/[facility] Care Management [phone]

## 2018-07-12 ENCOUNTER — Ambulatory Visit: Payer: Self-pay | Admitting: Family Medicine

## 2018-07-14 ENCOUNTER — Ambulatory Visit: Payer: Self-pay

## 2018-07-14 ENCOUNTER — Ambulatory Visit: Payer: Self-pay | Admitting: *Deleted

## 2018-07-14 DIAGNOSIS — I509 Heart failure, unspecified: Secondary | ICD-10-CM

## 2018-07-14 DIAGNOSIS — Z9114 Patient's other noncompliance with medication regimen: Secondary | ICD-10-CM

## 2018-07-14 DIAGNOSIS — E1140 Type 2 diabetes mellitus with diabetic neuropathy, unspecified: Secondary | ICD-10-CM

## 2018-07-14 DIAGNOSIS — F200 Paranoid schizophrenia: Secondary | ICD-10-CM

## 2018-07-14 NOTE — Chronic Care Management (AMB) (Signed)
   Care Management    Clinical Social Work Follow Up Note  07/14/2018 Name: Christy [name] MRN: [id number] DOB: Jun 12, 1955  Christy [name] is a 63 y.o. year old female who is a primary care patient of Jerrol Banana., MD. The CCM team was consulted for assistance with Level of Care Concerns.   Review of patient status, including review of consultants reports, other relevant assessments, and collaboration with appropriate care team members and the patient's provider was performed as part of comprehensive patient evaluation and provision of chronic care management services.   Phone call to patient's POA Saunders Glance  to discuss level of care concerns. [name] requested that she call this social worker back as she was on the other line with another provider.    Goals Addressed   None      Follow Up Plan: SW will follow up with patient by phone over the next 2 weeks    Chrystal Land, [place] Worker  [place] Management [phone]

## 2018-07-14 NOTE — Chronic Care Management (AMB) (Signed)
   Care Management   Note  07/14/2018 Name: EVONA WESTRA MRN: [id number] DOB: Sep 15, 1955  Care Coordination: Incoming call today from Saunders Glance, [facility] to Ms. Matsuura. [name] expresses concern and exasperation related to proceeding with placement of Ms. Kindler into an assisted living. She agreed to assistance from [place], however [name] states the communication between [facility] and herself is not fluid. States they have her calling multiple agencies without providing her with the understanding of why she is calling Medicaid CAP. She requests assistance and support from RN CM and LCSW as she feels CCM Team is best to determine appropriate facilities as Ms. Luedke is well known and supported by CCM Team.   Goals Addressed            This Visit's Progress   . Per POA Saunders Glance "Kenidi Elenbaas is not taking care of herself or taking her medications" (pt-stated)       Current Barriers:  . Lacks caregiver support.  . Literacy barriers . Non-adherence to prescribed medication regimen . Cognitive Deficits  Nurse Case Manager Clinical Goal(s):  [name] Over the next 14 days, patient's family/POAs will work with CCM clinical social worker to received groups homes list for possible patient placement secondary to patients lack or self care and medication management  Interventions:  . Provided active listening and emotional support to [place] . Facilitated communication between [place] and LCSW  Patient Self Care Activities:  . Currently UNABLE TO independently take medications, care for self without prompting  Initial goal documentation       Plan: Ongoing assistance with facility placement by CCM Team  Thao Vanover E. Rollene Rotunda, RN, BSN Nurse Care Coordinator [facility] Practice/[facility] Care Management [phone]

## 2018-07-18 ENCOUNTER — Ambulatory Visit: Payer: Self-pay

## 2018-07-18 ENCOUNTER — Other Ambulatory Visit: Payer: Self-pay | Admitting: Family Medicine

## 2018-07-18 DIAGNOSIS — E1140 Type 2 diabetes mellitus with diabetic neuropathy, unspecified: Secondary | ICD-10-CM

## 2018-07-18 MED ORDER — METFORMIN HCL 1000 MG PO TABS: 1000.0000 mg | ORAL_TABLET | Freq: Two times a day (BID) | ORAL | 3 refills | Status: DC

## 2018-07-18 NOTE — Telephone Encounter (Signed)
[facility] faxed refill request for the following medications:  metFORMIN (GLUCOPHAGE) 1000 MG tablet    Please advise.

## 2018-07-18 NOTE — Chronic Care Management (AMB) (Signed)
  Care Management   Note  07/18/2018 Name: IVELISE CASTILLO MRN: [id number] DOB: 1955/03/12  Care Coordination: Incoming telephone call from Ms. Saunders Glance, POA to Ms. [place]. Ms [name] is requesting assistance with form completion by PCP so Ms. Turnipseed can receive additional Personal Care Aide hours until placement in a higher care setting can be achieved. [name] emailed form to RN CM as she lives out of town. Will send to PCP for completion. [name] was also under the impression that [facility], embedded CCM LCSW was with the Medicaid office which is why she has not returned call. After RN CM clarified role of CCM LCSW, [name] was encouraged to reach back out to [facility] LCSW for assistance with patient placement.  Plan: Will remain available to coordinate patient care. Will follow up with Ms. Pritt later this week as previously scheduled.  Jonty Morrical E. Rollene Rotunda, RN, BSN Nurse Care Coordinator [facility] Practice/[facility] Care Management [phone]

## 2018-07-18 NOTE — Telephone Encounter (Signed)
Medication sent into the pharmacy. 

## 2018-07-19 ENCOUNTER — Ambulatory Visit: Payer: Self-pay | Admitting: *Deleted

## 2018-07-19 DIAGNOSIS — E119 Type 2 diabetes mellitus without complications: Secondary | ICD-10-CM

## 2018-07-19 DIAGNOSIS — N183 Chronic kidney disease, stage 3 unspecified: Secondary | ICD-10-CM

## 2018-07-20 ENCOUNTER — Telehealth: Payer: Self-pay

## 2018-07-20 ENCOUNTER — Ambulatory Visit: Payer: Self-pay

## 2018-07-20 ENCOUNTER — Ambulatory Visit: Payer: Self-pay | Admitting: *Deleted

## 2018-07-20 ENCOUNTER — Encounter: Payer: Self-pay | Admitting: *Deleted

## 2018-07-20 DIAGNOSIS — E1140 Type 2 diabetes mellitus with diabetic neuropathy, unspecified: Secondary | ICD-10-CM

## 2018-07-20 DIAGNOSIS — Z9114 Patient's other noncompliance with medication regimen: Secondary | ICD-10-CM

## 2018-07-20 DIAGNOSIS — F200 Paranoid schizophrenia: Secondary | ICD-10-CM

## 2018-07-20 DIAGNOSIS — E119 Type 2 diabetes mellitus without complications: Secondary | ICD-10-CM

## 2018-07-20 NOTE — Progress Notes (Signed)
This encounter was created in error - please disregard.

## 2018-07-20 NOTE — Chronic Care Management (AMB) (Signed)
    Care Management   Unsuccessful Call Note 07/20/2018 Name: Christy [name] MRN: [id number] DOB: 06-30-1955  [name] is a 63 year old female patient of Dr. Miguel Aschoff who was referred to the CCM program by Dr. Rosanna Randy to address DM management. The CCM Team has continued to follow and assist [name] with her healthcare needs and goals.    Was unable to reach patient via telephone today for follow up. Unfortunately I was unable to leave a HIPAA compliant VM message as patient 's VM has not been set up yet.   Plan: Will follow-up within 7 business days via telephone.    Mehar Kirkwood E. Rollene Rotunda, RN, BSN Nurse Care Coordinator [facility] Practice/[facility] Care Management [phone]

## 2018-07-20 NOTE — Chronic Care Management (AMB) (Signed)
  Chronic Care Management    Clinical Social Work General Follow Up Note  07/20/2018 Name: Christy [name] MRN: [id number] DOB: 14-Sep-1955  Christy [name] is a 63 y.o. year old female who is a primary care patient of Jerrol Banana., MD. The CCM team was consulted for assistance with    Review of patient status, including review of consultants reports, relevant laboratory and other test results, and collaboration with appropriate care team members and the patient's provider was performed as part of comprehensive patient evaluation and provision of chronic care management services.    Goals Addressed            This Visit's Progress   . Per POA "I need help getting Christy [name] more help in her home" (pt-stated)       Current Barriers:  . ADL IADL limitations  Clinical Social Work Clinical Goal(s):  [name] Over the next 90 days, client will work with SW to address concerns related to community resources to address patient's care needs.  Interventions: . Discussed plan for POA to continue to pursue the increase in personal care hours while ALF is being identified . Discussed plan for POA to attend patient's next doctor's visit to discuss interest in increase in in -home care hours for patient and ALF placement . Discussed plans with patient for ongoing care management follow up and provided patient with direct contact information for care management team . Collaborated with RN Case Manager re: patient's level of care and in home management needs. . Discussed adult guardianship process with patient's POA and plan for her to pick up the court documents from the court to take to patient's provider office visit   Patient Self Care Activities:  . Currently UNABLE TO independently take her medications as prescribed  Please see past updates related to this goal by clicking on the "Past Updates" button in the selected goal           Follow Up Plan:     SIGNATURE

## 2018-07-20 NOTE — Patient Instructions (Signed)
Thank you allowing the Chronic Care Management Team to be a part of your care! It was a pleasure speaking with you today!  1. Please call this social worker if there are any questions regarding your community resource needs   CCM (Chronic Care Management) Team   Trish Fountain RN, BSN Nurse Care Coordinator  [phone]  Ruben Reason PharmD  Clinical Pharmacist  [phone]   Elliot Gurney, LCSW Clinical Social Worker [phone]  Goals Addressed            This Visit's Progress   . Per POA "I need help getting [place] more help in her home" (pt-stated)       Current Barriers:  . ADL IADL limitations  Clinical Social Work Clinical Goal(s):  [name] Over the next 90 days, client will work with SW to address concerns related to community resources to address patient's care needs.  Interventions: . Provided patient with information about the Community Alternatives Program,Personal Care services, and types of facility care options . Discussed plans with patient for ongoing care management follow up and provided patient with direct contact information for care management team . Collaborated with RN Case Manager re: patient's level of care and in home management needs. . Assisted patient/caregiver with obtaining information about Medicaid benefits. Minette Brine number provided to [place] Management Entity [phone] for further explanation of Medicaid benefits . Provided education to patient/caregiver regarding level of care options.  Patient Self Care Activities:  . Currently UNABLE TO independently take her medications as prescribed  Initial goal documentation         The patient verbalized understanding of instructions provided today and declined a print copy of patient instruction materials.   The care management team will reach out to the patient again over the next 14 days days.

## 2018-07-20 NOTE — Chronic Care Management (AMB) (Signed)
  Chronic Care Management     Social Work Follow Up Note  07/20/2018 Name: KARCYN MENN MRN: [id number] DOB: 31-Oct-1955  MAKALIA BARE is a 63 y.o. year old female who is a primary care patient of Jerrol Banana., MD. The CCM team was consulted for assistance with Level of Care Concerns.   Review of patient status, including review of consultants reports, other relevant assessments, and collaboration with appropriate care team members and the patient's provider was performed as part of comprehensive patient evaluation and provision of chronic care management services.     Goals Addressed            This Visit's Progress   . Per POA "I need help getting Suri more help in her home" (pt-stated)       Current Barriers:  . ADL IADL limitations  Clinical Social Work Clinical Goal(s):  [name] Over the next 90 days, client will work with SW to address concerns related to community resources to address patient's care needs.  Interventions: . Provided patient's POA with information about the Community Alternatives Program,Personal Care services, and types of facility care options . Discussed plans with patient for ongoing care management follow up and provided patient with direct contact information for care management team . Collaborated with RN Case Manager re: patient's level of care and in home management needs. . Assisted patient/caregiver with obtaining information about Medicaid benefits. Minette Brine number provided to [place] Management Entity [phone] for further explanation of Medicaid benefits . Provided education to patient/caregiver regarding level of care options.  Patient Self Care Activities:  . Currently UNABLE TO independently take her medications as prescribed  Initial goal documentation         Follow Up Plan: SW will follow up with patient by phone over the next 14 days    [place], [place] Worker  [place] Management [phone]

## 2018-07-20 NOTE — Chronic Care Management (AMB) (Signed)
  Care Management   Note  07/20/2018 Name: Christy [name] MRN: [id number] DOB: [birth year]  Care Coordination: Successful return call to Ms. Saunders Glance, HCPOA to Ms. Mailin Laughery in response to her left VM this morning. Ms. Saunders Glance left message this morning requesting call back concerning being told [name] Medicaid Transportation has expired. Upon return call, [name] states she has called the number provided to her and Ms. Corvin transportation recertification would be taken care for. [name] also states she is ready to continue process for having Ms. Endo transferred to a higher level of care, possibly ALF. RN CM will continue to collaborate with CCM LCSW for facility placement. RN CM also encouraged [name] to present with Ms. Roundy to her next PCP visit 07/25/2018 to discuss concerns of patients lack of self care so there is open communication between, POA, patient, PCP, CM, and LCSW about plan.  Plan: Will follow up with patient as scheduled this pm.  Lisa Blakeman E. Rollene Rotunda, RN, BSN Nurse Care Coordinator [facility] Practice/[facility] Care Management [phone]

## 2018-07-20 NOTE — Patient Instructions (Signed)
Thank you allowing the Chronic Care Management Team to be a part of your care! It was a pleasure speaking with you today!  1. POA please bring guardianship papers to patient's next appointment for doctor to review 2. POA please discuss increase in personal care hours as well as ALF placement with the doctor during office visit   CCM (Chronic Care Management) Team   Trish Fountain RN, BSN Nurse Care Coordinator  [phone]  Ruben Reason PharmD  Clinical Pharmacist  [phone]   [place], [place] Social Worker [phone]  Goals Addressed            This Visit's Progress   . Per POA "I need help getting Lareen more help in her home" (pt-stated)       Current Barriers:  . ADL IADL limitations  Clinical Social Work Clinical Goal(s):  [name] Over the next 90 days, client will work with SW to address concerns related to community resources to address patient's care needs.  Interventions: . Discussed plan for POA to continue to pursue the increase in personal care hours while ALF is being identified . Discussed plan for POA to attend patient's next doctor's visit to discuss interest in increase in in -home care hours for patient and    ALF placement . Discussed plans with patient for ongoing care management follow up and provided patient with direct contact information for care management team . Collaborated with RN Case Manager re: patient's level of care and in home management needs. . Discussed adult guardianship process with patient's POA and plan for her to pick up the court documents from the court to take to patient's provider office visit   Patient Self Care Activities:  . Currently UNABLE TO independently take her medications as prescribed  Please see past updates related to this goal by clicking on the "Past Updates" button in the selected goal          The patient verbalized understanding of instructions provided today and declined a print copy  of patient instruction materials.   Follow up with provider re: increase in personal care services and plan ALF

## 2018-07-20 NOTE — Chronic Care Management (AMB) (Signed)
  Care Management    Clinical Social Work Follow Up Note  07/20/2018 Name: Christy [name] MRN: [id number] DOB: 01/27/1955  Christy [name] is a 63 y.o. year old female who is a primary care patient of Christy [name]., MD. The CCM team was consulted for assistance with Level of Care Concerns.   Review of patient status, including review of consultants reports, other relevant assessments, and collaboration with appropriate care team members and the patient's provider was performed as part of comprehensive patient evaluation and provision of chronic care management services.     Goals Addressed            This Visit's Progress   . Per POA "I need help getting Christy [name] more help in her home" (pt-stated)       Current Barriers:  . ADL IADL limitations  Clinical Social Work Clinical Goal(s):  [name] Over the next 90 days, client will work with SW to address concerns related to community resources to address patient's care needs.  Interventions: . Discussed plan for POA to continue to pursue the increase in personal care hours while ALF is being identified . Discussed plan for POA to attend patient's next doctor's visit to discuss interest in increase in in -home care hours for patient and    ALF placement . Discussed plans with patient for ongoing care management follow up and provided patient with direct contact information for care management team . Collaborated with RN Case Manager re: patient's level of care and in home management needs. . Discussed adult guardianship process with patient's POA and plan for her to pick up the court documents from the court to take to patient's provider office visit   Patient Self Care Activities:  . Currently UNABLE TO independently take her medications as prescribed  Please see past updates related to this goal by clicking on the "Past Updates" button in the selected goal          Follow Up Plan: SW will follow up with patient by phone over the next  2 weeks regarding status of personal care hours increase and facility care placement  [facility], [place] Worker  [place] Care Management [phone]

## 2018-07-20 NOTE — Progress Notes (Signed)
This encounter was created in error - please disregard.  This encounter was created in error - please disregard.

## 2018-07-21 ENCOUNTER — Ambulatory Visit: Payer: Self-pay

## 2018-07-21 DIAGNOSIS — E1140 Type 2 diabetes mellitus with diabetic neuropathy, unspecified: Secondary | ICD-10-CM

## 2018-07-21 DIAGNOSIS — F200 Paranoid schizophrenia: Secondary | ICD-10-CM

## 2018-07-21 DIAGNOSIS — Z9114 Patient's other noncompliance with medication regimen: Secondary | ICD-10-CM

## 2018-07-21 NOTE — Chronic Care Management (AMB) (Signed)
  Care Management   Note  07/21/2018 Name: Christy [name] MRN: [id number] DOB: Jun 19, 1955  Care Coordination: CCM RN CM received incoming email from Saunders Glance, [facility] for Christy [name]. Christy [name] is questioning if [facility] is appropriate for patient. CCM RN CM called and spoke with Christy [name], Intake coordinator with [facility]. Christy [name] case was discussed and it is believed that this program is not appropriate for Christy [name] at this time secondary to no day programs and potential for lack of caregiver involvement for when program is closed for holiday and weekend.   Discussed this information with Christy [name] who agrees that other options for higher level of care is needed.   Plan: Continue to collaborate with CCM LCSW, PCP, and POA for appropriate placement of patient.   Christy Medero E. Rollene Rotunda, RN, BSN Nurse Care Coordinator [facility] Care Management [phone]

## 2018-07-25 ENCOUNTER — Ambulatory Visit (INDEPENDENT_AMBULATORY_CARE_PROVIDER_SITE_OTHER): Payer: Medicaid Other | Admitting: Family Medicine

## 2018-07-25 ENCOUNTER — Other Ambulatory Visit: Payer: Self-pay

## 2018-07-25 ENCOUNTER — Encounter: Payer: Self-pay | Admitting: Family Medicine

## 2018-07-25 VITALS — BP 110/60 | HR 71 | Temp 97.9°F | Resp 16 | Wt 280.0 lb

## 2018-07-25 DIAGNOSIS — Z9114 Patient's other noncompliance with medication regimen: Secondary | ICD-10-CM

## 2018-07-25 DIAGNOSIS — E1140 Type 2 diabetes mellitus with diabetic neuropathy, unspecified: Secondary | ICD-10-CM | POA: Diagnosis not present

## 2018-07-25 DIAGNOSIS — E611 Iron deficiency: Secondary | ICD-10-CM

## 2018-07-25 DIAGNOSIS — K219 Gastro-esophageal reflux disease without esophagitis: Secondary | ICD-10-CM

## 2018-07-25 DIAGNOSIS — N183 Chronic kidney disease, stage 3 unspecified: Secondary | ICD-10-CM

## 2018-07-25 DIAGNOSIS — I10 Essential (primary) hypertension: Secondary | ICD-10-CM

## 2018-07-25 DIAGNOSIS — F200 Paranoid schizophrenia: Secondary | ICD-10-CM

## 2018-07-25 DIAGNOSIS — N3946 Mixed incontinence: Secondary | ICD-10-CM

## 2018-07-25 DIAGNOSIS — Z1239 Encounter for other screening for malignant neoplasm of breast: Secondary | ICD-10-CM | POA: Diagnosis not present

## 2018-07-25 DIAGNOSIS — Z6841 Body Mass Index (BMI) 40.0 and over, adult: Secondary | ICD-10-CM

## 2018-07-25 DIAGNOSIS — E559 Vitamin D deficiency, unspecified: Secondary | ICD-10-CM

## 2018-07-25 LAB — POCT GLYCOSYLATED HEMOGLOBIN (HGB A1C)
Est. average glucose Bld gHb Est-mCnc: 183
Hemoglobin A1C: 8 % — AB (ref 4.0–5.6)

## 2018-07-25 MED ORDER — LOSARTAN POTASSIUM 50 MG PO TABS: 50.0000 mg | ORAL_TABLET | Freq: Every day | ORAL | 3 refills | Status: AC

## 2018-07-25 NOTE — Progress Notes (Signed)
Patient: Christy [name] Female    DOB: 19-Aug-1955   63 y.o.   MRN: [id number] Visit Date: 07/25/2018  Today's Provider: Megan Mansichard  Jr, MD   Chief Complaint  Patient presents with  . Diabetes  . Hypertension   Subjective:     HPI  Diabetes Mellitus Type II, Follow-up:   Lab Results  Component Value Date   HGBA1C 8.6 (H) 01/12/2018   HGBA1C 9.1 (A) 10/03/2017   HGBA1C 11.5 06/07/2017    Last seen for diabetes 4 months ago.  Management since then includes . She reports fair compliance with treatment. She is not having side effects.  Current symptoms include increase appetite, polydipsia and polyuria and have been unchanged. Home blood sugar records: blood sugar readings at home have been reported in the 100s  Episodes of hypoglycemia? no   Current insulin regiment: N/A  Current exercise: none Current diet habits: on average, 2 meals per day  Pertinent Labs:    Component Value Date/Time   CHOL 186 01/12/2018 1514   CHOL 235 (H) 08/30/2012 0531   TRIG 318 (H) 01/12/2018 1514   TRIG 131 08/30/2012 0531   HDL 42 01/12/2018 1514   HDL 57 08/30/2012 0531   LDLCALC 80 01/12/2018 1514   LDLCALC 152 (H) 08/30/2012 0531   CREATININE 0.90 07/09/2018 1602   CREATININE 0.84 03/16/2013 1256    Wt Readings from Last 3 Encounters:  07/25/18 280 lb (127 kg)  07/10/18 273 lb (123.8 kg)  07/09/18 280 lb (127 kg)    ------------------------------------------------------------------------  Hypertension, follow-up:  BP Readings from Last 3 Encounters:  07/25/18 110/60  07/10/18 130/76  07/09/18 (!) 144/86    She was last seen for hypertension 4 months ago.  BP at that visit was 128/82. Management changes since that visit include none. She reports fair compliance with treatment. She is not having side effects. She is not exercising. She is not adherent to low salt diet.   Outside blood pressures are not being checked regularly. She is experiencing none.   Patient denies chest pain, chest pressure/discomfort, exertional chest pressure/discomfort, irregular heart beat and palpitations.   Cardiovascular risk factors include advanced age (older than 3855 for men, 1565 for women), diabetes mellitus, hypertension, obesity (BMI >= 30 kg/m2) and smoking/ tobacco exposure.  Use of agents associated with hypertension: NSAIDS.     Weight trend: increasing steadily Wt Readings from Last 3 Encounters:  07/25/18 280 lb (127 kg)  07/10/18 273 lb (123.8 kg)  07/09/18 280 lb (127 kg)    Current diet: not asked  ------------------------------------------------------------------------  Allergies  Allergen Reactions  . Benzoyl Peroxide   . Cephalexin Itching  . Peroxide [Hydrogen Peroxide] Swelling  . Shellfish Allergy Nausea And Vomiting     Current Outpatient Medications:  .  ACCU-CHEK SOFTCLIX LANCETS lancets, Check sugar once daily  DX E11.9, Disp: 100 each, Rfl: 12 .  Amantadine HCl 100 MG tablet, Take 1 tablet (100 mg total) by mouth 2 (two) times daily., Disp: , Rfl:  .  aspirin EC 81 MG tablet, Take 81 mg by mouth daily., Disp: , Rfl:  .  dicyclomine (BENTYL) 10 MG capsule, Take 1 capsule (10 mg total) by mouth 2 (two) times daily., Disp: 60 capsule, Rfl: 11 .  docusate sodium (COLACE) 100 MG capsule, Take 1 capsule (100 mg total) by mouth daily. (Patient taking differently: Take 200 mg by mouth daily. ), Disp: 30 capsule, Rfl: 0 .  fluPHENAZine (PROLIXIN) 2.5  MG/ML injection, Inject into the muscle., Disp: , Rfl:  .  fluticasone (FLONASE) 50 MCG/ACT nasal spray, Place 2 sprays into both nostrils daily as needed for allergies., Disp: 16 g, Rfl: 12 .  furosemide (LASIX) 40 MG tablet, Take 1 tablet (40 mg total) by mouth 2 (two) times daily., Disp: 60 tablet, Rfl: 6 .  glimepiride (AMARYL) 4 MG tablet, Two tablets daily with breakfast, Disp: 180 tablet, Rfl: 3 .  glucose blood (ACCU-CHEK AVIVA) test strip, Check sugar once daily DX E11.9, Disp: 50  each, Rfl: 12 .  hydrOXYzine (ATARAX/VISTARIL) 25 MG tablet, Take 1 tablet (25 mg total) by mouth every 8 (eight) hours as needed for itching., Disp: 20 tablet, Rfl: 0 .  Lancet Devices (ACCU-CHEK SOFTCLIX) lancets, Check sugar once daily, DX E11.9, Disp: 50 each, Rfl: 12 .  loratadine (CLARITIN) 10 MG tablet, Take 1 tablet (10 mg total) by mouth daily., Disp: 30 tablet, Rfl: 12 .  meloxicam (MOBIC) 15 MG tablet, Take 15 mg by mouth daily., Disp: , Rfl:  .  metFORMIN (GLUCOPHAGE) 1000 MG tablet, Take 1 tablet (1,000 mg total) by mouth 2 (two) times daily with a meal., Disp: 180 tablet, Rfl: 3 .  metoprolol succinate (TOPROL-XL) 25 MG 24 hr tablet, Take 0.5 tablets (12.5 mg total) by mouth daily., Disp: 15 tablet, Rfl: 12 .  ondansetron (ZOFRAN) 4 MG tablet, Take 1 tablet (4 mg total) by mouth every 8 (eight) hours as needed for nausea or vomiting., Disp: 60 tablet, Rfl: 5 .  pantoprazole (PROTONIX) 40 MG tablet, Take 40 mg by mouth daily., Disp: , Rfl:  .  simvastatin (ZOCOR) 10 MG tablet, Take 1 tablet (10 mg total) by mouth daily., Disp: 30 tablet, Rfl: 12 .  triamcinolone lotion (KENALOG) 0.1 %, Apply 1 application topically 3 (three) times daily. Prn itching, Disp: 30 mL, Rfl: 0 .  triamterene-hydrochlorothiazide (MAXZIDE-25) 37.5-25 MG tablet, Take 1 tablet by mouth daily., Disp: 30 tablet, Rfl: 12 .  trihexyphenidyl (ARTANE) 2 MG tablet, Take 2 mg by mouth daily., Disp: , Rfl:  .  valACYclovir (VALTREX) 1000 MG tablet, Take 1 tablet (1,000 mg total) by mouth daily., Disp: 90 tablet, Rfl: 3 .  potassium chloride SA (K-DUR) 20 MEQ tablet, Take 2 tablets (40 mEq total) by mouth 2 (two) times daily for 7 days., Disp: 2828 tablet, Rfl: 0  Review of Systems  Constitutional: Negative.   HENT: Negative.   Eyes: Negative.   Respiratory: Negative.   Cardiovascular: Negative.   Gastrointestinal: Positive for constipation and diarrhea.  Endocrine: Positive for polydipsia, polyphagia and polyuria.   Genitourinary: Positive for enuresis and frequency.  Musculoskeletal: Negative.     Social History   Tobacco Use  . Smoking status: Current Some Day Smoker    Packs/day: 0.00    Types: Cigarettes  . Smokeless tobacco: Never Used  . Tobacco comment: only smokes sometimes  Substance Use Topics  . Alcohol use: Yes    Comment: beer occasionally      Objective:   BP 110/60   Pulse 71   Temp 97.9 F (36.6 C) (Oral)   Resp 16   Wt 280 lb (127 kg)   SpO2 96%   BMI 40.76 kg/m  Vitals:   07/25/18 1100  BP: 110/60  Pulse: 71  Resp: 16  Temp: 97.9 F (36.6 C)  TempSrc: Oral  SpO2: 96%  Weight: 280 lb (127 kg)     Physical Exam Vitals signs reviewed.  Constitutional:  Appearance: She is well-developed.     Comments: Obese BF NAD  HENT:     Head: Normocephalic and atraumatic.  Eyes:     General: No scleral icterus.    Conjunctiva/sclera: Conjunctivae normal.  Neck:     Thyroid: No thyromegaly.     Vascular: No carotid bruit.  Cardiovascular:     Rate and Rhythm: Normal rate and regular rhythm.     Heart sounds: Normal heart sounds.  Pulmonary:     Effort: Pulmonary effort is normal.     Breath sounds: Normal breath sounds.  Abdominal:     Palpations: Abdomen is soft.  Musculoskeletal:        General: No deformity or signs of injury.     Comments: 1+edam with support hose.  Lymphadenopathy:     Cervical: No cervical adenopathy.  Skin:    General: Skin is warm and dry.  Neurological:     Mental Status: She is alert and oriented to person, place, and time.     Comments: Nonfocal.  Psychiatric:        Mood and Affect: Mood normal.        Behavior: Behavior normal.        Judgment: Judgment normal.      No results found for any visits on 07/25/18.     Assessment & Plan    1. Type 2 diabetes mellitus with diabetic neuropathy, without long-term current use of insulin (HCC) Goal A1c less than 7.5 - POCT glycosylated hemoglobin (Hb A1C)  2. CKD  (chronic kidney disease), stage III (HCC)  - CBC with Differential/Platelet - Renal Function Panel  3. Iron deficiency  - Iron, TIBC and Ferritin Panel  4. Breast cancer screening  - MM Digital Screening; Future  5. Essential hypertension   6. Gastroesophageal reflux disease, esophagitis presence not specified   7. Paranoid schizophrenia ([place]) More than 50% of  25 minute visit spent in counseling or coordination of care POA trying do get pt in goup home which I think would be appropriate. It is difficult to try to reconcile pt medication.   8. Morbid obesity with body mass index (BMI) of 45.0 to 49.9 in adult ([place])   9. Avitaminosis D   10. Mixed stress and urge urinary incontinence May need urology referral.  Try adult diapers.  11. Drug noncompliance Long term issue. Unsure how much of her meds she takes. Hopefully most of them.      Cranford Mon, MD  [facility] Fritzi Mandes [place],acting as a scribe for Wilhemena Durie, MD.,have documented all relevant documentation on the behalf of Wilhemena Durie, MD,as directed by  Wilhemena Durie, MD while in the presence of Wilhemena Durie, MD.

## 2018-07-26 ENCOUNTER — Encounter: Payer: Self-pay | Admitting: Family Medicine

## 2018-07-26 ENCOUNTER — Ambulatory Visit: Payer: Self-pay | Admitting: *Deleted

## 2018-07-26 ENCOUNTER — Telehealth: Payer: Self-pay

## 2018-07-26 DIAGNOSIS — E1140 Type 2 diabetes mellitus with diabetic neuropathy, unspecified: Secondary | ICD-10-CM

## 2018-07-26 DIAGNOSIS — Z9114 Patient's other noncompliance with medication regimen: Secondary | ICD-10-CM

## 2018-07-26 LAB — CBC WITH DIFFERENTIAL/PLATELET
Basophils Absolute: 0.1 10*3/uL (ref 0.0–0.2)
Basos: 1 %
EOS (ABSOLUTE): 0.4 10*3/uL (ref 0.0–0.4)
Eos: 5 %
Hematocrit: 41.6 % (ref 34.0–46.6)
Hemoglobin: 13.1 g/dL (ref 11.1–15.9)
Immature Grans (Abs): 0 10*3/uL (ref 0.0–0.1)
Immature Granulocytes: 0 %
Lymphocytes Absolute: 1.8 10*3/uL (ref 0.7–3.1)
Lymphs: 19 %
MCH: 22.1 pg — ABNORMAL LOW (ref 26.6–33.0)
MCHC: 31.5 g/dL (ref 31.5–35.7)
MCV: 70 fL — ABNORMAL LOW (ref 79–97)
Monocytes Absolute: 0.9 10*3/uL (ref 0.1–0.9)
Monocytes: 10 %
Neutrophils Absolute: 6 10*3/uL (ref 1.4–7.0)
Neutrophils: 65 %
Platelets: 315 10*3/uL (ref 150–450)
RBC: 5.93 x10E6/uL — ABNORMAL HIGH (ref 3.77–5.28)
RDW: 16.2 % — ABNORMAL HIGH (ref 11.7–15.4)
WBC: 9.2 10*3/uL (ref 3.4–10.8)

## 2018-07-26 LAB — IRON,TIBC AND FERRITIN PANEL
Ferritin: 112 ng/mL (ref 15–150)
Iron Saturation: 10 % — ABNORMAL LOW (ref 15–55)
Iron: 39 ug/dL (ref 27–139)
Total Iron Binding Capacity: 377 ug/dL (ref 250–450)
UIBC: 338 ug/dL (ref 118–369)

## 2018-07-26 LAB — RENAL FUNCTION PANEL
Albumin: 4.8 g/dL (ref 3.8–4.8)
BUN/Creatinine Ratio: 8 — ABNORMAL LOW (ref 12–28)
BUN: 7 mg/dL — ABNORMAL LOW (ref 8–27)
CO2: 25 mmol/L (ref 20–29)
Calcium: 10.4 mg/dL — ABNORMAL HIGH (ref 8.7–10.3)
Chloride: 96 mmol/L (ref 96–106)
Creatinine, Ser: 0.83 mg/dL (ref 0.57–1.00)
GFR calc Af Amer: 86 mL/min/{1.73_m2} (ref >59)
GFR calc non Af Amer: 75 mL/min/{1.73_m2} (ref >59)
Glucose: 181 mg/dL — ABNORMAL HIGH (ref 65–99)
Phosphorus: 3.1 mg/dL (ref 3.0–4.3)
Potassium: 4.1 mmol/L (ref 3.5–5.2)
Sodium: 139 mmol/L (ref 134–144)

## 2018-07-26 NOTE — Telephone Encounter (Signed)
Medication lists from our office, pharmacy and what she is actually taking is not matching.   Her Healthcare POA would like to discuss meds before any changes be made.   Saunders Glance CB#  [phone]

## 2018-07-26 NOTE — Telephone Encounter (Signed)
Please advise 

## 2018-07-26 NOTE — Chronic Care Management (AMB) (Signed)
  Chronic Care Management    Clinical Social Work Follow Up Note  07/26/2018 Name: AAMARI WEST MRN: [id number] DOB: 1955-11-07  TALETHA TWIFORD is a 63 y.o. year old female who is a primary care patient of Jerrol Banana., MD. The CCM team was consulted for assistance with Level of Care Concerns.   Review of patient status, including review of consultants reports, other relevant assessments, and collaboration with appropriate care team members and the patient's provider was performed as part of comprehensive patient evaluation and provision of chronic care management services.     Goals Addressed            This Visit's Progress   . Per POA "I need help getting Maleena more help in her home" (pt-stated)       Current Barriers:  . ADL IADL limitations  Clinical Social Work Clinical Goal(s):  [name] Over the next 90 days, client will work with SW to address concerns related to community resources to address patient's care needs.  Interventions: . Discussed status of request for increased personal care hours, per POA patient will need to be diagnosed with Dementia for   approval of increased hours . Confirmed with POA that patient does not have this diagnosis . Confirmed need for FL2 to be completed by patient's doctor for ALF placement . Contact information provided to patient's POA for [facility]  . Patient's POA confirmed that patient has resided there before and there is a bed available-facility will need FL2 . Prepared and faxed FL2 for patient's provider to complete and sign . Patient's POA confirmed that she received the legal guardianship paperwork from the court and will work on completing this process after patient is placed in a ALF. [name] Patient's POA discussed need for patient's medications to be reconciled due to multiple errors and duplications. Nash Dimmer with RNCM regarding request for assistance with patient's medications and completing FL2 . Discussed plans with  patient for ongoing care management follow up and provided patient with direct contact information for care management team   Patient Self Care Activities:  . Currently UNABLE TO independently take her medications as prescribed  Please see past updates related to this goal by clicking on the "Past Updates" button in the selected goal          Follow Up Plan: SW will follow up with patient by phone over the next 2 weeks    [place], [place] Worker  [place] Care Management [phone]

## 2018-07-26 NOTE — Patient Instructions (Addendum)
Thank you allowing the Chronic Care Management Team to be a part of your care! It was a pleasure speaking with you today!  1. Please call this social worker with any concerns regarding patient's placement needs.     CCM (Chronic Care Management) Team   Trish Fountain RN, BSN Nurse Care Coordinator  [phone]  Ruben Reason PharmD  Clinical Pharmacist  [phone]   Elliot Gurney, LCSW Clinical Social Worker [phone]  Goals Addressed            This Visit's Progress   . Per POA "I need help getting Christy [name] more help in her home" (pt-stated)       Current Barriers:  . ADL IADL limitations  Clinical Social Work Clinical Goal(s):  [name] Over the next 90 days, client will work with SW to address concerns related to community resources to address patient's care needs.  Interventions: . Discussed status of request for increased personal care hours, per POA patient will need to be diagnosed with Dementia for   approval of increased hours . Confirmed with POA that patient does not have this diagnosis . Confirmed need for FL2 to be completed by patient's doctor for ALF placement . Contact information provided to patient's POA for [facility]  . Patient's POA confirmed that patient has resided there before and there is a bed available-facility will need FL2 . Prepared and faxed FL2 for patient's provider to complete and sign . Patient's POA confirmed that she received the legal guardianship paperwork from the court and will work on completing this process after patient is placed in a ALF. [name] Patient's POA discussed need for patient's medications to be reconciled due to multiple errors and duplications. Nash Dimmer with RNCM regarding request for assistance with patient's medications and completing FL2 . Discussed plans with patient for ongoing care management follow up and provided patient with direct contact information for care management team   Patient Self Care  Activities:  . Currently UNABLE TO independently take her medications as prescribed  Please see past updates related to this goal by clicking on the "Past Updates" button in the selected goal          The patient verbalized understanding of instructions provided today and declined a print copy of patient instruction materials.   Telephone follow up appointment with care management team member scheduled for:

## 2018-07-27 ENCOUNTER — Ambulatory Visit: Payer: Self-pay

## 2018-07-27 ENCOUNTER — Other Ambulatory Visit: Payer: Self-pay

## 2018-07-27 ENCOUNTER — Telehealth: Payer: Self-pay | Admitting: Family Medicine

## 2018-07-27 DIAGNOSIS — F200 Paranoid schizophrenia: Secondary | ICD-10-CM

## 2018-07-27 DIAGNOSIS — E1140 Type 2 diabetes mellitus with diabetic neuropathy, unspecified: Secondary | ICD-10-CM

## 2018-07-27 NOTE — Chronic Care Management (AMB) (Signed)
  Care Management   Follow Up Note   07/27/2018 Name: Christy [name] MRN: [id number] DOB: Apr 12, 1955  Referred by: Christy [name]., MD Reason for referral : Chronic Care Management (follow up on placement)    Subjective: per Christy [name] "I really need a list of Christy [name]'s medications and want to understand why she is on two meds for the same condition"   Objective:   Assessment:  Christy [name] is a 63 year old female patient of Dr. Miguel [name] who was referred to the CCM program by Dr. Rosanna [name] to address DM management. The CCM Team has continued to follow and assist Christy [name] with her healthcare needs and goals.RN CM received an incoming message yesterday from Christy [name] requesting call back. Today RN CM was able to successfully contact Christy [name] and provided answers to her questions.   Review of patient status, including review of consultants reports, relevant laboratory and other test results, and collaboration with appropriate care team members and the patient's provider was performed as part of comprehensive patient evaluation and provision of chronic care management services.    Goals Addressed            This Visit's Progress   . COMPLETED: Per POA Christy [name] "Christy [name] is not taking care of herself or taking her medications" (pt-stated)       Christy [name] is now very involved in patient's care. She is collaborating with Christy [name], Medicaid, CCM LCSW, and CCM RN CM to ensure Christy [name] recieves the care needed to maintain her health. Christy [name] has expressed interest in finding Christy [name] a higher level of care and is working with a specific Assisted Living for placement. FL2 is in the process of being completed by PCP. Christy [name] will continue to work with [name] to facilitate placement.   Current Barriers:  . Lacks caregiver support.  . Literacy barriers . Non-adherence to prescribed medication regimen . Cognitive Deficits  Nurse Case  Manager Clinical Goal(s):  Christy [name] Kitchen Over the next 14 days, patient's family/POAs will work with CCM clinical social worker to received groups homes list for possible patient placement secondary to patients lack or self care and medication management  Interventions:  . Provided active listening and emotional support to Christy [name] . Ongoing colloboration with PCP to promote facility placement . Requested prescription for Adult Diapers XL to be sent to pharmacy . Reconciled medications and explained purpose for each medications to POA   Patient Self Care Activities:  . Currently UNABLE TO independently take medications, care for self without prompting  Initial goal documentation         Follow Up Plan: CCM RN CM will remain available to assist Christy [name] with placement as needed.   Christy Oo E. Rollene Rotunda, RN, BSN Nurse Care Coordinator [facility] [phone]

## 2018-07-27 NOTE — Telephone Encounter (Signed)
Christy [name] with [facility] called saying she has faxed a form saying they are doing case management.  They need a current medication list and her her last office notes.  Faxed to [phone]  Thanks teri

## 2018-07-27 NOTE — Telephone Encounter (Signed)
Information was faxed 07/27/2018 @ 10:05 AM.

## 2018-08-01 ENCOUNTER — Telehealth: Payer: Self-pay

## 2018-08-01 ENCOUNTER — Ambulatory Visit: Payer: Self-pay

## 2018-08-01 DIAGNOSIS — E1140 Type 2 diabetes mellitus with diabetic neuropathy, unspecified: Secondary | ICD-10-CM

## 2018-08-01 DIAGNOSIS — Z9114 Patient's other noncompliance with medication regimen: Secondary | ICD-10-CM

## 2018-08-01 DIAGNOSIS — F200 Paranoid schizophrenia: Secondary | ICD-10-CM

## 2018-08-01 NOTE — Chronic Care Management (AMB) (Signed)
  Care Management   Follow Up Note   08/01/2018 Name: Christy [name] MRN: [id number] DOB: May 07, 1955  Referred by: Christy [name]., MD Reason for referral : Chronic Care Management    Subjective: per email from Christy [name] "Can you please check on FL2 status as this is needed for [facility] to do an assessment"   Objective:   Assessment: Christy [name] is a 63 year old female patient of Dr. Miguel [name] who was referred to the CCM program by Dr. Rosanna [name] to address DM management. The CCM Team has continued to follow and assist Christy [name] with her healthcare needs and goals.RN CM received an incoming email yesterday from [facility] requesting Fl2 to be faxed to ALF as soon as possible.   Review of patient status, including review of consultants reports, relevant laboratory and other test results, and collaboration with appropriate care team members and the patient's provider was performed as part of comprehensive patient evaluation and provision of chronic care management services.    Goals Addressed            This Visit's Progress   . Per POA "I need help getting Christy [name] more help in her home" (pt-stated)       Current Barriers:  . ADL IADL limitations  Clinical Social Work Clinical Goal(s):  [name] Over the next 90 days, client will work with SW to address concerns related to community resources to address patient's care needs.  Interventions: . Discussed status of request for increased personal care hours, per POA patient will need to be diagnosed with Dementia for   approval of increased hours . Confirmed with POA that patient does not have this diagnosis . Confirmed need for FL2 to be completed by patient's doctor for ALF placement . Contact information provided to patient's POA for [facility]  . Patient's POA confirmed that patient has resided there before and there is a bed available-facility will need FL2 . Prepared and faxed FL2 for patient's provider to complete and  sign . Patient's POA confirmed that she received the legal guardianship paperwork from the court and will work on completing this process after patient is placed in a ALF. [name] Patient's POA discussed need for patient's medications to be reconciled due to multiple errors and duplications. Christy [name] with RNCM regarding request for assistance with patient's medications and completing FL2 . Discussed plans with patient for ongoing care management follow up and provided patient with direct contact information for care management team  Nursing Interventions; . Colloboration with PCP office to facilitate Christy [name] fax to [facility] at [phone] Attn: Clancy Gourd   Patient Self Care Activities:  . Currently UNABLE TO independently take her medications as prescribed  Please see past updates related to this goal by clicking on the "Past Updates" button in the selected goal          Follow Up Plan: Will continue to remain available to patient/family for assistance with placement  Edilberto Roosevelt E. Rollene Rotunda, RN, BSN Nurse Care Coordinator [facility] Care Management [phone]

## 2018-08-01 NOTE — Telephone Encounter (Signed)
-----   Message from Jerrol Banana., MD sent at 08/01/2018  8:23 AM EDT ----- Labs better. No changes presently

## 2018-08-01 NOTE — Telephone Encounter (Signed)
Patient notified of lab results

## 2018-08-07 ENCOUNTER — Ambulatory Visit: Payer: Self-pay

## 2018-08-07 DIAGNOSIS — Z9114 Patient's other noncompliance with medication regimen: Secondary | ICD-10-CM

## 2018-08-07 DIAGNOSIS — E1140 Type 2 diabetes mellitus with diabetic neuropathy, unspecified: Secondary | ICD-10-CM

## 2018-08-07 NOTE — Telephone Encounter (Signed)
C[facility] with [facility] called saying the [facility] is not complete .  She needs a call back about [name]'s medication.  CB#  [phone]  Con Memos

## 2018-08-07 NOTE — Addendum Note (Signed)
Addended by: Cathi Roan on: 08/07/2018 11:24 AM   Modules accepted: Orders

## 2018-08-07 NOTE — Patient Instructions (Signed)
Thank you allowing the Chronic Care Management Team to be a part of your care! It was a pleasure speaking with you today!  1. Follow up with CCM RN CM if additional assistance is needed for patient placement.  CCM (Chronic Care Management) Team   Yvone NeuPortia Jesua Tamblyn RN, BSN Nurse Care Coordinator  [phone]  Karalee HeightJulie Hedrick PharmD  Clinical Pharmacist  [phone]   Verna Czechhrystal Land, LCSW Clinical Social Worker [phone]  Goals Addressed            This Visit's Progress   . COMPLETED: "I don't want to take so many pills" (pt-stated)       Current Barriers:  . High pill burden . Complex problem list requiring many long term medications  . Difficulty remembering to take medications  Pharmacist Clinical Goal(s): Over the next 90 days, [name] will be adherent to all medications as evidenced by patient report   Update 05/03/18: [name] reports that she has forgotten to take her pills about 1 morning per week out of the past 2 weeks  Interventions: . Patient educated on purpose, proper use and potential adverse effects of all medications. . CCM pharmacist will work with providers to convert Ms. Soyars's medications to once daily dosing  . CCM pharmacist will provide updated medication list to all of Ms. Jewell's medical providers  Patient Self Care Activities:  . Take all medications as prescribed  Please see past updates related to this goal by clicking on the "Past Updates" button in the selected goal       . COMPLETED: "I want to get better control of my sugar" (pt-stated)        Current Barriers:  [name]. Knowledge deficit related to DM self care . Cognition and mental health . Caregiver support and independent living  Nurse Case Manager Clinical Goal(s): Over the next 60 days, patient will engage in DM self care activities such as daily glucometer checks, medication compliance, ADA diet compliance  Interventions:   Discussed importance of checking blood sugars  daily  Discussed importance of limiting simple carbs such as cakes, candies, regular sodas, and juice  Discussed importance of eating regular meals  Reviewed importance of drinking water to "flush" extra sugar out of her body.  Discussed utilizing walking as a way to reduce blood sugars  Discussed importance of taking medications every day as scheduled  Provided emotional support and reassurance related to current pandemic of COVID-19  Discussed current CDC recommendations for social distancing and hand hygiene.    Patient Self Care Activities:  . Take medications as prescribed . Exercise as tolerated . Check blood sugars independently . Does not skip meals . Follow CDC guidelines for symptom reporting, social distancing, and hand hygiene  Please see past updates related to this goal by clicking on the "Past Updates" button in the selected goal     . Per POA "I need help getting [name] more help in her home" (pt-stated)   On track    Current Barriers:  . ADL IADL limitations  Clinical Social Work Clinical Goal(s):  [name]. Over the next 90 days, client will work with SW to address concerns related to community resources to address patient's care needs.  Interventions: . Discussed status of request for increased personal care hours, per POA patient will need to be diagnosed with Dementia for   approval of increased hours . Confirmed with POA that patient does not have this diagnosis . Confirmed need for FL2 to be completed  by patient's doctor for ALF placement . Contact information provided to patient's POA for [facility]  . Patient's POA confirmed that patient has resided there before and there is a bed available-facility will need FL2 . Prepared and faxed FL2 for patient's provider to complete and sign . Patient's POA confirmed that she received the legal guardianship paperwork from the court and will work on completing this process after patient is placed in a  ALF. [name] Patient's POA discussed need for patient's medications to be reconciled due to multiple errors and duplications. Nash Dimmer with RNCM regarding request for assistance with patient's medications and completing FL2 . Discussed plans with patient for ongoing care management follow up and provided patient with direct contact information for care management team  Nursing Interventions; . Provided emotional support and reassurance to patient and [place]   Patient Self Care Activities:  . Currently UNABLE TO independently take her medications as prescribed  Please see past updates related to this goal by clicking on the "Past Updates" button in the selected goal         The patient verbalized understanding of instructions provided today and declined a print copy of patient instruction materials.   The patient has been provided with contact information for the care management team and has been advised to call with any health related questions or concerns.   SYMPTOMS OF A STROKE   You have any symptoms of stroke. "BE FAST" is an easy way to remember the main warning signs: ? B - Balance. Signs are dizziness, sudden trouble walking, or loss of balance. ? E - Eyes. Signs are trouble seeing or a sudden change in how you see. ? F - Face. Signs are sudden weakness or loss of feeling of the face, or the face or eyelid drooping on one side. ? A - Arms. Signs are weakness or loss of feeling in an arm. This happens suddenly and usually on one side of the body. ? S - Speech. Signs are sudden trouble speaking, slurred speech, or trouble understanding what people say. ? T - Time. Time to call emergency services. Write down what time symptoms started.  You have other signs of stroke, such as: ? A sudden, very bad headache with no known cause. ? Feeling sick to your stomach (nausea). ? Throwing up (vomiting). ? Jerky movements you cannot control (seizure).  SYMPTOMS OF A HEART  ATTACK  What are the signs or symptoms? Symptoms of this condition include:  Chest pain. It may feel like: ? Crushing or squeezing. ? Tightness, pressure, fullness, or heaviness.  Pain in the arm, neck, jaw, back, or upper body.  Shortness of breath.  Heartburn.  Indigestion.  Nausea.  Cold sweats.  Feeling tired.  Sudden lightheadedness.

## 2018-08-07 NOTE — Telephone Encounter (Signed)
Spoke with Crystal and she reports that she faxed over completed FL2 and needs Dr. Rosanna Randy to sign. Awaiting fax and signature. Will fax to [place] once completed.

## 2018-08-07 NOTE — Chronic Care Management (AMB) (Signed)
Care Management   Follow Up Note   08/07/2018 Name: Christy BashMinnie B [name] MRN: [id number] DOB: 10-Nov-1955  Referred by: Maple HudsonGilbert, Richard L Jr., MD Reason for referral : Chronic Care Management    Subjective: "I get to go back to [facility]"   Objective:   Assessment: Christy [name] is a 63 year old female patient of Dr. Julieanne Mansonichard Gilbert who was referred to the CCM program by Dr. Sullivan LoneGilbert to address DM management. The CCM Team has continued to follow and assist Christy [name] with her healthcare needs and goals.RN CM received an incoming VM from Christy [name] requesting call back.   Upon return call, Christy [name], patient, and [facility] admissions coordinator were at patients home completing an intake assessment. Per intake coordinator, patient should be placed at [facility] by Thursday 08/10/2018. CCM RN CM will complete patients goals as today was final encounter with patient. RN CM will continue to engage with Christy [name] as needed until placement is completed. Christy [name] indicates today that her reason for calling RN CM had been [facility] [place].   Review of patient status, including review of consultants reports, relevant laboratory and other test results, and collaboration with appropriate care team members and the patient's provider was performed as part of comprehensive patient evaluation and provision of chronic care management services.    Goals Addressed            This Visit's Progress   . COMPLETED: "I don't want to take so many pills" (pt-stated)       Current Barriers:  . High pill burden . Complex problem list requiring many long term medications  . Difficulty remembering to take medications  Pharmacist Clinical Goal(s): Over the next 90 days, Christy [name] will be adherent to all medications as evidenced by patient report   Update 05/03/18: Christy [name] reports that she has forgotten to take her pills about 1 morning per week out of the past 2 weeks  Interventions: .  Patient educated on purpose, proper use and potential adverse effects of all medications. . CCM pharmacist will work with providers to convert Christy [name]'s medications to once daily dosing  . CCM pharmacist will provide updated medication list to all of Christy [name]'s medical providers  Patient Self Care Activities:  . Take all medications as prescribed  Please see past updates related to this goal by clicking on the "Past Updates" button in the selected goal       . COMPLETED: "I want to get better control of my sugar" (pt-stated)        Current Barriers:  [name]. Knowledge deficit related to DM self care . Cognition and mental health . Caregiver support and independent living  Nurse Case Manager Clinical Goal(s): Over the next 60 days, patient will engage in DM self care activities such as daily glucometer checks, medication compliance, ADA diet compliance  Interventions:   Discussed importance of checking blood sugars daily  Discussed importance of limiting simple carbs such as cakes, candies, regular sodas, and juice  Discussed importance of eating regular meals  Reviewed importance of drinking water to "flush" extra sugar out of her body.  Discussed utilizing walking as a way to reduce blood sugars  Discussed importance of taking medications every day as scheduled  Provided emotional support and reassurance related to current pandemic of COVID-19  Discussed current CDC recommendations for social distancing and hand hygiene.    Patient Self Care Activities:  . Take medications as prescribed . Exercise  as tolerated . Check blood sugars independently . Does not skip meals . Follow CDC guidelines for symptom reporting, social distancing, and hand hygiene  Please see past updates related to this goal by clicking on the "Past Updates" button in the selected goal    . Per POA "I need help getting [name] more help in her home" (pt-stated)   On track    Current Barriers:  . ADL IADL  limitations  Clinical Social Work Clinical Goal(s):  [name] Over the next 90 days, client will work with SW to address concerns related to community resources to address patient's care needs.  Interventions: . Discussed status of request for increased personal care hours, per POA patient will need to be diagnosed with Dementia for   approval of increased hours . Confirmed with POA that patient does not have this diagnosis . Confirmed need for FL2 to be completed by patient's doctor for ALF placement . Contact information provided to patient's POA for [facility]  . Patient's POA confirmed that patient has resided there before and there is a bed available-facility will need FL2 . Prepared and faxed FL2 for patient's provider to complete and sign . Patient's POA confirmed that she received the legal guardianship paperwork from the court and will work on completing this process after patient is placed in a ALF. [name] Patient's POA discussed need for patient's medications to be reconciled due to multiple errors and duplications. Nash Dimmer with RNCM regarding request for assistance with patient's medications and completing FL2 . Discussed plans with patient for ongoing care management follow up and provided patient with direct contact information for care management team  Nursing Interventions; . Provided emotional support and reassurance to patient and [place]   Patient Self Care Activities:  . Currently UNABLE TO independently take her medications as prescribed  Please see past updates related to this goal by clicking on the "Past Updates" button in the selected goal        Follow Up Plan: As needed until placement is confirmed   Brewster Wolters E. Rollene Rotunda, RN, BSN Nurse Care Coordinator [facility] Care Management [phone]

## 2018-08-08 ENCOUNTER — Ambulatory Visit: Payer: Self-pay | Admitting: *Deleted

## 2018-08-08 DIAGNOSIS — Z9114 Patient's other noncompliance with medication regimen: Secondary | ICD-10-CM

## 2018-08-08 DIAGNOSIS — F200 Paranoid schizophrenia: Secondary | ICD-10-CM

## 2018-08-08 NOTE — Chronic Care Management (AMB) (Signed)
  Chronic Care Management    Clinical Social Work Follow Up Note  08/08/2018 Name: Christy [name] MRN: [id number] DOB: 01-25-1956  Christy [name] is a 63 y.o. year old female who is a primary care patient of Jerrol Banana., MD. The CCM team was consulted for assistance with Level of Care Concerns.   Review of patient status, including review of consultants reports, other relevant assessments, and collaboration with appropriate care team members and the patient's provider was performed as part of comprehensive patient evaluation and provision of chronic care management services.     Goals Addressed            This Visit's Progress   . Per POA "I need help getting [name] more help in her home" (pt-stated)       Late entry-Phone call from patient's POA and intake coordinator for [facility] requesting additional information to complete the FL2 for placement purposes.  Current Barriers:  . ADL IADL limitations  Clinical Social Work Clinical Goal(s):  [name] Over the next 90 days, client will work with SW to address concerns related to community resources to address patient's care needs.  Interventions: . Discussed patient's tentative placement at [facility] on Thursday 08/10/18 . Confirmed with POA and the intake coordinator at [facility] that the [facility] was incomplete and needed additional information on the diagnosis and medication section . Collaborated with the CCM Pharmacist and patient's providers office to complete the medication and diagnosis sections on the FL2 to be forwarded to [facility]-fax [phone] .   Patient Self Care Activities:  . Currently UNABLE TO independently take her medications as prescribed  Please see past updates related to this goal by clicking on the "Past Updates" button in the selected goal          Follow Up Plan: SW will follow up with patient by phone over the next 2 weeks regarding placement at [facility] [place] Worker  [facility] Management [phone]

## 2018-08-08 NOTE — Patient Instructions (Signed)
Thank you allowing the Chronic Care Management Team to be a part of your care! It was a pleasure speaking with you today!  1. Please call the CCM social worker with any questions or concerns regarding level of care concerns.  CCM (Chronic Care Management) Team   Trish Fountain RN, BSN Nurse Care Coordinator  [phone]  Ruben Reason PharmD  Clinical Pharmacist  [phone]   Elliot Gurney, LCSW Clinical Social Worker [phone]  Goals Addressed            This Visit's Progress   . Per POA "I need help getting [place] more help in her home" (pt-stated)       Current Barriers:  . ADL IADL limitations  Clinical Social Work Clinical Goal(s):  [name] Over the next 90 days, client will work with SW to address concerns related to community resources to address patient's care needs.  Interventions: . Discussed patient's tentative placement at [facility] on Thursday 08/10/18 . Confirmed with POA and the intake coordinator at [facility] that the [facility] was incomplete and needed additional information on the diagnosis and medication section . Collaborated with the CCM Pharmacist and patient's providers office to complete the medication and diagnosis sections on the FL2 to be forwarded to [facility]-fax [phone] .   Patient Self Care Activities:  . Currently UNABLE TO independently take her medications as prescribed  Please see past updates related to this goal by clicking on the "Past Updates" button in the selected goal          The patient verbalized understanding of instructions provided today and declined a print copy of patient instruction materials.   The care management team will reach out to the patient again over the next 14  days. regarding placement at [facility]

## 2018-08-09 ENCOUNTER — Ambulatory Visit: Payer: Self-pay

## 2018-08-09 ENCOUNTER — Telehealth: Payer: Self-pay

## 2018-08-09 DIAGNOSIS — E1140 Type 2 diabetes mellitus with diabetic neuropathy, unspecified: Secondary | ICD-10-CM

## 2018-08-09 DIAGNOSIS — F200 Paranoid schizophrenia: Secondary | ICD-10-CM

## 2018-08-09 NOTE — Chronic Care Management (AMB) (Signed)
  Chronic Care Management   Note  08/09/2018 Name: Christy [name] MRN: [id number] DOB: 21-Jan-1956  Care Coordination: Successful telephone encounter Saunders Glance, POA after [name] requested call via email. [name] is requesting assistance with confirmation of all [name] specialty providers including cardiology, gastroenterology, and podiatry. This information was provided to [name]. [name] will be admitted to [facility] tomorrow.  [name] E. Rollene Rotunda, RN, BSN Nurse Care Coordinator [facility] Care Management [phone]

## 2018-08-10 ENCOUNTER — Ambulatory Visit: Payer: Self-pay | Admitting: *Deleted

## 2018-08-10 ENCOUNTER — Encounter: Payer: Self-pay | Admitting: *Deleted

## 2018-08-10 ENCOUNTER — Ambulatory Visit: Payer: Self-pay

## 2018-08-10 DIAGNOSIS — Z9114 Patient's other noncompliance with medication regimen: Secondary | ICD-10-CM

## 2018-08-10 DIAGNOSIS — F200 Paranoid schizophrenia: Secondary | ICD-10-CM

## 2018-08-10 NOTE — Chronic Care Management (AMB) (Signed)
  Chronic Care Management   Note  08/10/2018 Name: RIATA IKEDA MRN: [id number] DOB: August 07, 1955  Care Coordination: Incoming telephone call from Saunders Glance requesting assistance with having prescription for Adult Diapers XL sent to patients pharmacy. RN CM spoke with Wilburt Finlay, PCP CMA who will fax prescription to pharmacy. [name] notified. Of note, per [name], Ms. Pereyra is currently being moved into [facility].    Jill Stopka E. Rollene Rotunda, RN, BSN Nurse Care Coordinator [facility] Care Management [phone]

## 2018-08-11 NOTE — Chronic Care Management (AMB) (Signed)
  Chronic Care Management   Social Work Note  08/11/2018 Name: TRINIKA CORTESE MRN: [id number] DOB: 1955/10/27  NELL GALES is a 63 y.o. year old female who sees Jerrol Banana., MD for primary care. The CCM team was consulted for assistance with Level of Care Concerns   Phone call from patient's POA requesting that the FL2 be faced to an alternate number [phone] . This Education officer, museum reached out to the provider's CMA who faxed the [facility] as requested. It was further confirmed that patient has moved in to [place] today.  Goals Addressed   None     Follow Up Plan: No further follow up required at this time as patient has now been transitioned to [place] Care Management [phone]

## 2018-08-11 NOTE — Patient Instructions (Signed)
Thank you allowing the Chronic Care Management Team to be a part of your care! It was a pleasure speaking with you today!   CCM (Chronic Care Management) Team   Trish Fountain RN, BSN Nurse Care Coordinator  [phone]  Ruben Reason PharmD  Clinical Pharmacist  [phone]   [place], LCSW Clinical Social Worker [phone]  Goals Addressed   None      The patient verbalized understanding of instructions provided today and declined a print copy of patient instruction materials.   No further follow up required: Patient now resides in a ALF and will be followed by their in house staff provider

## 2018-08-11 NOTE — Progress Notes (Signed)
This encounter was created in error - please disregard.

## 2018-08-16 ENCOUNTER — Telehealth: Payer: Self-pay

## 2018-08-17 ENCOUNTER — Other Ambulatory Visit: Payer: Self-pay | Admitting: Family Medicine

## 2018-08-17 MED ORDER — LORATADINE 10 MG PO TABS: 10.0000 mg | ORAL_TABLET | Freq: Every day | ORAL | 12 refills | Status: DC

## 2018-08-17 NOTE — Telephone Encounter (Signed)
done

## 2018-08-17 NOTE — Telephone Encounter (Signed)
[facility] faxed refill request for the following medications:  loratadine (CLARITIN) 10 MG tablet  LOV: 07/25/2018 Please advise. Thanks TNP

## 2018-10-19 ENCOUNTER — Ambulatory Visit: Payer: Medicaid Other | Admitting: Family Medicine

## 2018-11-16 ENCOUNTER — Telehealth: Payer: Self-pay | Admitting: Family Medicine

## 2018-11-16 NOTE — Telephone Encounter (Signed)
Pt's POA, Saunders Glance called to find out all the doctors pt went to.  She is needing to continue her care.  [name] is asking for Evlyn Clines to call her back because she has known the pt for a while.  Please cal [name] back at [phone]  Pt was seeing: Eye Doctor Vain and Vascular Doctor Heart Doctor Ect...  Thanks, [employer]

## 2018-11-17 NOTE — Telephone Encounter (Signed)
Advised patient's POA as below.

## 2019-10-29 ENCOUNTER — Other Ambulatory Visit: Payer: Self-pay | Admitting: Obstetrics & Gynecology

## 2019-10-29 DIAGNOSIS — Z1231 Encounter for screening mammogram for malignant neoplasm of breast: Secondary | ICD-10-CM

## 2020-01-03 ENCOUNTER — Ambulatory Visit
Admission: RE | Admit: 2020-01-03 | Discharge: 2020-01-03 | Disposition: A | Discharge status: Home / Self Care | Payer: Medicare Other | Source: Ambulatory Visit | Attending: Obstetrics & Gynecology | Admitting: Obstetrics & Gynecology

## 2020-01-03 ENCOUNTER — Other Ambulatory Visit: Payer: Self-pay

## 2020-01-03 DIAGNOSIS — Z1231 Encounter for screening mammogram for malignant neoplasm of breast: Secondary | ICD-10-CM | POA: Insufficient documentation

## 2020-03-05 ENCOUNTER — Other Ambulatory Visit: Payer: Self-pay | Admitting: Neurology

## 2020-03-05 DIAGNOSIS — G2119 Other drug induced secondary parkinsonism: Secondary | ICD-10-CM

## 2020-03-18 ENCOUNTER — Ambulatory Visit
Admission: RE | Admit: 2020-03-18 | Discharge: 2020-03-18 | Disposition: A | Discharge status: Home / Self Care | Payer: Medicare Other | Source: Ambulatory Visit | Attending: Neurology | Admitting: Neurology

## 2020-03-18 ENCOUNTER — Other Ambulatory Visit: Payer: Self-pay

## 2020-03-18 DIAGNOSIS — G2119 Other drug induced secondary parkinsonism: Secondary | ICD-10-CM | POA: Insufficient documentation

## 2020-04-30 ENCOUNTER — Other Ambulatory Visit: Payer: Self-pay | Admitting: Adult Health

## 2020-04-30 DIAGNOSIS — R1312 Dysphagia, oropharyngeal phase: Secondary | ICD-10-CM

## 2020-05-19 ENCOUNTER — Ambulatory Visit
Admission: RE | Admit: 2020-05-19 | Discharge: 2020-05-19 | Disposition: A | Discharge status: Home / Self Care | Payer: Medicare Other | Source: Ambulatory Visit | Attending: Adult Health | Admitting: Adult Health

## 2020-05-19 ENCOUNTER — Other Ambulatory Visit: Payer: Self-pay

## 2020-05-19 DIAGNOSIS — R1312 Dysphagia, oropharyngeal phase: Secondary | ICD-10-CM | POA: Diagnosis not present

## 2020-05-19 NOTE — Therapy (Signed)
[name] DIAGNOSTIC RADIOLOGY [address] [place], [place], [address] Phone: [phone]   Fax:     Modified Barium Swallow  Patient Details  Name: Christy [name] MRN: [id number] Date of Birth: 02/15/1955 No data recorded  Encounter Date: 05/19/2020   End of Session - 05/19/20 1451    Visit Number 1    Number of Visits 1    Date for SLP Re-Evaluation 05/19/20    SLP Start Time 1240    SLP Stop Time  1310    SLP Time Calculation (min) 30 min    Activity Tolerance Patient tolerated treatment well          Objective Swallowing Evaluation: Type of Study: MBS-Modified Barium Swallow Study   Patient Details  Name: Christy [name] MRN: [id number] Date of Birth: 12/26/1955  Today's Date: 05/19/2020 Time: SLP Start Time (ACUTE ONLY): 1240 -SLP Stop Time (ACUTE ONLY): 1310  SLP Time Calculation (min) (ACUTE ONLY): 30 min   Past Medical History:  Past Medical History:  Diagnosis Date  . Arthritis   . Asthma   . Diabetes mellitus without complication [facility])    Patient takes Metformin  . Edema   . Genital herpes   . GERD (gastroesophageal reflux disease)   . Hyperlipidemia   . Hypertension   . Malaise and fatigue   . Mental status alteration   . Schizophrenia [facility])    Past Surgical History:  Past Surgical History:  Procedure Laterality Date  . ABDOMINAL HYSTERECTOMY     due to fibroids in 1997  . CHOLECYSTECTOMY    . COLONOSCOPY WITH PROPOFOL N/A 08/15/2015   Procedure: COLONOSCOPY WITH PROPOFOL;  Surgeon: Christena Deem, MD;  Location: [facility] ENDOSCOPY;  Service: Endoscopy;  Laterality: N/A;  . ESOPHAGOGASTRODUODENOSCOPY (EGD) WITH PROPOFOL N/A 08/15/2015   Procedure: ESOPHAGOGASTRODUODENOSCOPY (EGD) WITH PROPOFOL;  Surgeon: Christena Deem, MD;  Location: [facility] ENDOSCOPY;  Service: Endoscopy;  Laterality: N/A;  . FOOT SURGERY Right   . GANGLION CYST EXCISION    . TONSILLECTOMY    . UPPER GI ENDOSCOPY  07/31/13   mild chronic  inflammation and reactive gastropathy-no need for another EGD repeat   HPI: Christy [name] is a pleasant 65 y.o. female with past medical history noted for drug-induced Parkinsonism, schizophrenia, GERD, CHF, DM II referred by MD at her facility [facility] for MBS to assess pharyngeal swallow function. Patient reports primarily difficulty with solids, particularly tough meats and pills which she feels sticking at approximate level of thyroid notch. She occasionally has a sensation that liquids don't "go right," and needs to clear her throat.  She denies recent PNA or pulmonary issues. She is known to SLP service, having had MBS in 2017 due to similar complaints; this showed delay in swallow initiation with transient laryngeal penetration but no aspiration. 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   Subjective: alert, pleasant, cooperative    Assessment / Plan / Recommendation  CHL IP CLINICAL IMPRESSIONS 05/19/2020  Clinical Impression Patient presents with mild oropharyngeal dysphagia with function appearing similar to previous MBS in 2017. Dyskinesias primarily of lips, tongue noted during oral motor examination, however adequate strength and ROM. Pt also has hand tremor which impacts bolus retrieval when self-feeding, although she is able to do so with caution. Mild oral holding/discoordination with premature spillage to the valleculae and pyriforms before the swallow. Pt had difficulty with oral transit/bolus cohesion with barium pill and thin liquid; was ultimately able to transfer using spoonful of  puree. Pharyngeal swallow initiation with thin and nectar liquids occurs as liquids begin to spill to the pyriform sinuses. With large sips, this results in transient shallow laryngeal penetration with no observable residue in the laryngeal vestibule or pharynx post-swallow. Triggers at the valleculae for solids. Despite delayed swallow initiation, pharyngeal stage is within functional limits.  Base of tongue retraction, hyolaryngeal excursion and pharyngeal constriciton are adequate, with complete epiglottic deflection and no aspiration or pharyngeal residue observed. Amplitude/duration of cricopharyngeal opening is within normal limits. An esophageal sweep was performed in the upright position which was unremarkable, with full clearance of contrast through the distal esophagus. Patient was recommended to consume regular diet with thin liquids by cup, using small sips to facilitate better oral control /timing. Recommend taking pills whole in puree. As patient reported globus sensation with meats and other solids, as well as regurgitation (about once a week) after meals, provided written and verbal education re: reflux precautions.  SLP Visit Diagnosis Dysphagia, oropharyngeal phase (R13.12)  Attention and concentration deficit following --  Frontal lobe and executive function deficit following --  Impact on safety and function Mild aspiration risk      CHL IP TREATMENT RECOMMENDATION 05/19/2020  Treatment Recommendations Defer treatment plan to f/u with SLP     Prognosis 05/19/2020  Prognosis for Safe Diet Advancement Good  Barriers to Reach Goals --  Barriers/Prognosis Comment --    CHL IP DIET RECOMMENDATION 05/19/2020  SLP Diet Recommendations Regular solids;Thin liquid  Liquid Administration via Cup  Medication Administration Whole meds with puree  Compensations Slow rate;Small sips/bites;Follow solids with liquid  Postural Changes Remain semi-upright after after feeds/meals (Comment);Seated upright at 90 degrees      CHL IP OTHER RECOMMENDATIONS 05/19/2020  Recommended Consults Other (Comment)  Oral Care Recommendations Oral care BID  Other Recommendations --      No flowsheet data found.    No flowsheet data found.         CHL IP ORAL PHASE 05/19/2020  Oral Phase Impaired  Oral - Pudding Teaspoon --  Oral - Pudding Cup --  Oral - Honey Teaspoon --  Oral - Honey  Cup --  Oral - Nectar Teaspoon --  Oral - Nectar Cup Holding of bolus;Premature spillage;Delayed oral transit  Oral - Nectar Straw --  Oral - Thin Teaspoon --  Oral - Thin Cup Holding of bolus;Premature spillage;Delayed oral transit  Oral - Thin Straw --  Oral - Puree Decreased bolus cohesion  Oral - Mech Soft Decreased bolus cohesion  Oral - Regular --  Oral - Multi-Consistency --  Oral - Pill Decreased bolus cohesion;Reduced posterior propulsion;Holding of bolus;Piecemeal swallowing;Other (Comment)  Oral Phase - Comment --    CHL IP PHARYNGEAL PHASE 05/19/2020  Pharyngeal Phase Impaired  Pharyngeal- Pudding Teaspoon --  Pharyngeal --  Pharyngeal- Pudding Cup --  Pharyngeal --  Pharyngeal- Honey Teaspoon --  Pharyngeal --  Pharyngeal- Honey Cup --  Pharyngeal --  Pharyngeal- Nectar Teaspoon --  Pharyngeal --  Pharyngeal- Nectar Cup Delayed swallow initiation-pyriform sinuses;Penetration/Aspiration before swallow  Pharyngeal Material enters airway, remains ABOVE vocal cords then ejected out  Pharyngeal- Nectar Straw --  Pharyngeal --  Pharyngeal- Thin Teaspoon --  Pharyngeal --  Pharyngeal- Thin Cup Delayed swallow initiation-pyriform sinuses;Penetration/Aspiration before swallow  Pharyngeal Material enters airway, remains ABOVE vocal cords then ejected out  Pharyngeal- Thin Straw --  Pharyngeal --  Pharyngeal- Puree Delayed swallow initiation-vallecula  Pharyngeal Material does not enter airway  Pharyngeal- Mechanical Soft Delayed swallow  initiation-vallecula  Pharyngeal Material does not enter airway  Pharyngeal- Regular --  Pharyngeal --  Pharyngeal- Multi-consistency --  Pharyngeal --  Pharyngeal- Pill Delayed swallow initiation-vallecula  Pharyngeal Material does not enter airway  Pharyngeal Comment --     CHL IP CERVICAL ESOPHAGEAL PHASE 05/19/2020  Cervical Esophageal Phase WFL  Pudding Teaspoon --  Pudding Cup --  Honey Teaspoon --  Honey Cup --  Nectar  Teaspoon --  Nectar Cup --  Nectar Straw --  Thin Teaspoon --  Thin Cup --  Thin Straw --  Puree --  Mechanical Soft --  Regular --  Multi-consistency --  Pill --  Cervical Esophageal Comment --     Christy [name] 05/19/2020, 2:52 PM                        There were no vitals filed for this visit.   Subjective Assessment - 05/19/20 1408    Subjective Pt complains of sticking sensation with solids, occasional "wrong pipe" sensation with liquids    Currently in Pain? No/denies                 Oropharyngeal dysphagia - Plan: DG SWALLOW FUNC OP MEDICARE SPEECH PATH, DG SWALLOW FUNC OP MEDICARE SPEECH PATH        Problem List Patient Active Problem List   Diagnosis Date Noted  . Schizophrenia (HCC) 01/16/2018  . CKD (chronic kidney disease), stage III (HCC) 06/07/2017  . Controlled type 2 diabetes mellitus without complication, without long-term current use of insulin (HCC) 04/19/2016  . Status post abdominal hysterectomy 05/01/2015  . Morbid obesity with body mass index (BMI) of 45.0 to 49.9 in adult [facility]) 05/01/2015  . Acute respiratory failure (HCC) 04/28/2015  . Allergic rhinitis 08/02/2014  . Arthropathia 08/02/2014  . CCF (congestive cardiac failure) (HCC) 08/02/2014  . Current tobacco use 08/02/2014  . Diabetic neuropathy (HCC) 08/02/2014  . Genital herpes 08/02/2014  . Acid reflux 08/02/2014  . BP (high blood pressure) 08/02/2014  . HLD (hyperlipidemia) 08/02/2014  . Incontinence 08/02/2014  . Neuropathy 08/02/2014  . Drug noncompliance 08/02/2014  . Adult BMI 30+ 08/02/2014  . Arthritis, degenerative 08/02/2014  . Delusional disorder (HCC) 08/02/2014  . Avitaminosis D 08/02/2014  . Can't get food down 07/16/2013   Rondel Baton, MS, CCC-SLP Speech-Language Pathologist  Christy [name] 05/19/2020, 2:52 PM  [facility] DIAGNOSTIC RADIOLOGY [address] [place], [place], [address] Phone:  [phone]   Fax:     Name: Christy [name] MRN: [id number] Date of Birth: [birth year]

## 2020-09-10 ENCOUNTER — Non-Acute Institutional Stay: Payer: Medicare Other | Admitting: Primary Care

## 2020-09-10 ENCOUNTER — Other Ambulatory Visit: Payer: Self-pay

## 2020-09-10 DIAGNOSIS — F22 Delusional disorders: Secondary | ICD-10-CM

## 2020-09-10 DIAGNOSIS — Z720 Tobacco use: Secondary | ICD-10-CM

## 2020-09-10 DIAGNOSIS — Z515 Encounter for palliative care: Secondary | ICD-10-CM

## 2020-09-10 DIAGNOSIS — E119 Type 2 diabetes mellitus without complications: Secondary | ICD-10-CM

## 2020-09-10 NOTE — Progress Notes (Signed)
[facility] Palliative Care Consult Note Telephone: [phone]  Fax: [phone]   Date of encounter: 09/10/20 PATIENT NAME: Christy [name] [place] [address]   [phone] (home)  DOB: July 13, 1955 MRN: [id number] PRIMARY CARE PROVIDER:    Bonnita Nasuti, MD,  [address] [place] [address] [phone]  REFERRING PROVIDER:   Verl Blalock, NP Verl Blalock, NP [address] [place],  [place] [address] [phone]   RESPONSIBLE PARTY:    Contact Information     Name Relation Home Work Mobile   [place] Relative [phone]     [place] Glance Relative   [place] [phone]          I met face to face with patient  in [facility] facility. Palliative Care was asked to follow this patient by consultation request of  Verl Blalock, NP  to address advance care planning and complex medical decision making. This is the initial visit.                                    ASSESSMENT AND PLAN / RECOMMENDATIONS:   Advance Care Planning/Goals of Care: Goals include to maximize quality of life and symptom management. Patient/health care surrogate gave his/her permission to discuss.Our advance care planning conversation included a discussion about:    Exploration of personal, cultural or spiritual beliefs that might influence medical decisions  Exploration of goals of care in the event of a sudden injury or illness  Identification of a healthcare agent CODE STATUS: Full code  Symptom Management/Plan:  I met with patient in her nursing home in her assisted living room. She was pleasant and interactive and told me about her  history. She says that she can't walk very well because of her painful knees. She is just received some injections for them but states these do not last long. She has several knee immobilizer/stabilizers and does use these. They help make her pain a  little better.  She has a walker and states she can get herself up but it is difficult.   We discussed possible use of more wheelchair time especially as her joint status declines. She and  staff stated that her cousins are her power of attorney I have phoned [name] but have a nonworking number and I have phoned Sheena, and left a message.    Follow up Palliative Care Visit: Palliative care will continue to follow for complex medical decision making, advance care planning, and clarification of goals. Return 6-8 weeks or prn.  I spent 45 minutes providing this consultation. More than 50% of the time in this consultation was spent in counseling and care coordination.  PPS: 40%  HOSPICE ELIGIBILITY/DIAGNOSIS: TBD  Chief Complaint: debility, pain  HISTORY OF PRESENT ILLNESS:  Christy [name] is a 65 y.o. year old female  with Parkinsons, dementia with ley bodies, CHF, GeRD, obesity, OA, CKD, DM and paranoid schizophrenia presents to palliative care to establish.   History obtained from review of EMR, discussion with primary team, and interview with family, facility staff/caregiver and/or Ms. Heathcock.  I reviewed available labs, medications, imaging, studies and related documents from the EMR.  Records reviewed and summarized above.   ROS  General: NAD EYES: denies vision changes ENMT: denies dysphagia Cardiovascular: denies chest pain, denies DOE Pulmonary: denies cough, denies increased SOB Abdomen:  endorses good appetite, denies constipation,  endorses occ diarrhea, endorses continence of bowel GU: denies dysuria, endorses continence of urine MSK:  endorses weakness,  endorses bil knee pain, + falls reported Skin: denies rashes or wounds Neurological: endorses chronic  pain, denies insomnia Psych: Endorses positive mood Heme/lymph/immuno: denies bruises, abnormal bleeding  Physical Exam: Current and past weights: 286 lbs Constitutional: NAD General: frail appearing, obese  EYES:  anicteric sclera, lids intact, no discharge  ENMT: intact hearing, oral mucous membranes moist, dentition intact CV: no LE edema Pulmonary: no increased work of breathing, no cough, room air Abdomen: intake 100%, no ascites GU: deferred MSK: no sarcopenia, moves all extremities, ambulatory with walker Skin: warm and dry, no rashes or wounds on visible skin Neuro:  + generalized weakness,  + cognitive impairment Psych: non-anxious affect, A and O x 1-2 Hem/lymph/immuno: no widespread bruising CURRENT PROBLEM LIST:  Patient Active Problem List   Diagnosis Date Noted   Schizophrenia ([place]) 01/16/2018   CKD (chronic kidney disease), stage III ([place]) 06/07/2017   Controlled type 2 diabetes mellitus without complication, without long-term current use of insulin ([place]) 04/19/2016   Status post abdominal hysterectomy 05/01/2015   Morbid obesity with body mass index (BMI) of 45.0 to 49.9 in adult ([place]) 05/01/2015   Acute respiratory failure (HCC) 04/28/2015   Allergic rhinitis 08/02/2014   Arthropathia 08/02/2014   CCF (congestive cardiac failure) (HCC) 08/02/2014   Current tobacco use 08/02/2014   Diabetic neuropathy (HCC) 08/02/2014   Genital herpes 08/02/2014   Acid reflux 08/02/2014   BP (high blood pressure) 08/02/2014   HLD (hyperlipidemia) 08/02/2014   Incontinence 08/02/2014   Neuropathy 08/02/2014   Drug noncompliance 08/02/2014   Adult BMI 30+ 08/02/2014   Arthritis, degenerative 08/02/2014   Delusional disorder ([place]) 08/02/2014   Avitaminosis D 08/02/2014   Can't get food down 07/16/2013   PAST MEDICAL HISTORY:  Active Ambulatory Problems    Diagnosis Date Noted   Allergic rhinitis 08/02/2014   Arthropathia 08/02/2014   CCF (congestive cardiac failure) ([place]) 08/02/2014   Current tobacco use 08/02/2014   Diabetic neuropathy ([place]) 08/02/2014   Genital herpes 08/02/2014   Acid reflux 08/02/2014   BP (high blood pressure) 08/02/2014   HLD (hyperlipidemia) 08/02/2014    Incontinence 08/02/2014   Neuropathy 08/02/2014   Drug noncompliance 08/02/2014   Adult BMI 30+ 08/02/2014   Arthritis, degenerative 08/02/2014   Delusional disorder ([place]) 08/02/2014   Avitaminosis D 08/02/2014   Acute respiratory failure ([place]) 04/28/2015   Status post abdominal hysterectomy 05/01/2015   Morbid obesity with body mass index (BMI) of 45.0 to 49.9 in adult ([place]) 05/01/2015   Can't get food down 07/16/2013   Controlled type 2 diabetes mellitus without complication, without long-term current use of insulin ([place]) 04/19/2016   CKD (chronic kidney disease), stage III ([place]) 06/07/2017   Schizophrenia ([place]) 01/16/2018   Resolved Ambulatory Problems    Diagnosis Date Noted   Acute exacerbation of chronic paranoid schizophrenia ([place]) 08/02/2014   Past Medical History:  Diagnosis Date   Arthritis    Asthma    Diabetes mellitus without complication (HCC)    Edema    GERD (gastroesophageal reflux disease)    Hyperlipidemia    Hypertension    Malaise and fatigue    Mental status alteration    SOCIAL HX:  Social History   Tobacco Use   Smoking status: Some Days    Packs/day: 0.00    Types: Cigarettes   Smokeless tobacco: Never  Tobacco comments:    only smokes sometimes  Substance Use Topics   Alcohol use: Yes    Comment: beer occasionally   FAMILY HX:  Family History  Problem Relation Age of Onset   Diabetes Mother    Pulmonary embolism Mother    Diabetes Father       ALLERGIES:  Allergies  Allergen Reactions   Benzoyl Peroxide    Cephalexin Itching   Peroxide [Hydrogen Peroxide] Swelling   Shellfish Allergy Nausea And Vomiting     PERTINENT MEDICATIONS:  Outpatient Encounter Medications as of 09/10/2020  Medication Sig   ACCU-CHEK SOFTCLIX LANCETS lancets Check sugar once daily  DX E11.9   acetaminophen (TYLENOL) 500 MG tablet Take 2 tablets by mouth in the morning, at noon, and at bedtime.   aspirin 81 MG EC tablet Take 1 tablet by mouth daily.    dicyclomine (BENTYL) 10 MG capsule Take 1 capsule (10 mg total) by mouth 2 (two) times daily.   diphenoxylate-atropine (LOMOTIL) 2.5-0.025 MG tablet Take 1 tablet by mouth 2 (two) times daily as needed for diarrhea or loose stools.   FLUPHENAZINE DECANOATE IJ Inject 25 mg into the muscle every 21 ( twenty-one) days.   furosemide (LASIX) 40 MG tablet Take 1 tablet (40 mg total) by mouth 2 (two) times daily.   glimepiride (AMARYL) 4 MG tablet Two tablets daily with breakfast   glucose blood (ACCU-CHEK AVIVA) test strip Check sugar once daily DX E11.9   Lancet Devices (ACCU-CHEK SOFTCLIX) lancets Check sugar once daily, DX E11.9   loratadine (CLARITIN) 10 MG tablet Take 1 tablet (10 mg total) by mouth daily.   losartan (COZAAR) 50 MG tablet Take 1 tablet (50 mg total) by mouth daily.   Menthol, Topical Analgesic, (ICY HOT ORIGINAL PAIN RELIEF) 2.5 % GEL Apply 1 application topically in the morning, at noon, and at bedtime. To both knees   metFORMIN (GLUCOPHAGE) 1000 MG tablet Take 1 tablet (1,000 mg total) by mouth 2 (two) times daily with a meal.   metoprolol succinate (TOPROL-XL) 25 MG 24 hr tablet Take 0.5 tablets (12.5 mg total) by mouth daily.   Multiple Vitamins-Minerals (MULTI-VITAMIN GUMMIES) CHEW Chew 2 tablets by mouth daily at 12 noon.   olopatadine (PATANOL) 0.1 % ophthalmic solution Place 1 drop into both eyes daily at 12 noon.   pantoprazole (PROTONIX) 40 MG tablet Take 40 mg by mouth daily.   potassium chloride (KLOR-CON) 10 MEQ tablet Take 1 tablet by mouth daily.   simvastatin (ZOCOR) 10 MG tablet Take 1 tablet (10 mg total) by mouth daily.   traMADol (ULTRAM) 50 MG tablet Take 1 tablet by mouth in the morning, at noon, and at bedtime.  Give 1 tablet q 12h prn breakthrough pain. Hold for lethargy   trihexyphenidyl (ARTANE) 2 MG tablet Take 1 tablet by mouth daily.   valACYclovir (VALTREX) 1000 MG tablet Take 1 tablet (1,000 mg total) by mouth daily.   vitamin B-12 (CYANOCOBALAMIN)  1000 MCG tablet Take 1,000 mcg by mouth daily.   [DISCONTINUED] trihexyphenidyl (ARTANE) 2 MG tablet Take 2 mg by mouth daily.   oxybutynin (DITROPAN) 5 MG tablet Take 1 tablet by mouth in the morning, at noon, and at bedtime.   [DISCONTINUED] docusate sodium (COLACE) 100 MG capsule Take 1 capsule (100 mg total) by mouth daily. (Patient taking differently: Take 200 mg by mouth daily. )   [DISCONTINUED] hydrOXYzine (ATARAX/VISTARIL) 25 MG tablet Take 1 tablet (25 mg total) by mouth every 8 (eight) hours  as needed for itching. (Patient not taking: Reported on 09/10/2020)   [DISCONTINUED] Valbenazine Tosylate (INGREZZA) 40 MG CAPS Take 40 mg by mouth daily. (Patient not taking: No sig reported)   No facility-administered encounter medications on file as of 09/10/2020.    Thank you for the opportunity to participate in the care of Ms. Schwebach.  The palliative care team will continue to follow. Please call our office at [phone] if we can be of additional assistance.   Jason Coop, NP , DNP, AGPCNP-BC, ACHPN  COVID-19 PATIENT SCREENING TOOL Asked and negative response unless otherwise noted:  Have you had symptoms of covid, tested positive or been in contact with someone with symptoms/positive test in the past 5-10 days?

## 2020-09-11 ENCOUNTER — Telehealth: Payer: Self-pay | Admitting: Primary Care

## 2020-09-11 NOTE — Telephone Encounter (Signed)
I spoke with patient's power of attorney by phone. We discussed patient's needs for pain management. She has an appointment to see orthopedics next week whereby they will discuss possibly replacement surgery. Power of attorney feels she may have difficulty with a Rehab due to her behavioral and cognitive limitations. We discuss use of a wheelchair for mobility given the pain  she has on ambulation. We discussed medication treatment and the limitations. Power of attorney will take all of these discussions under advisement and discuss with ortho next week and we will connect after that at a future visit with the patient.

## 2020-10-27 ENCOUNTER — Other Ambulatory Visit: Payer: Self-pay

## 2020-10-27 ENCOUNTER — Emergency Department: Payer: Medicare Other

## 2020-10-27 ENCOUNTER — Emergency Department
Admission: EM | Admit: 2020-10-27 | Discharge: 2020-10-27 | Disposition: A | Discharge status: Home / Self Care | Payer: Medicare Other | Attending: Emergency Medicine | Admitting: Emergency Medicine

## 2020-10-27 DIAGNOSIS — E1122 Type 2 diabetes mellitus with diabetic chronic kidney disease: Secondary | ICD-10-CM | POA: Insufficient documentation

## 2020-10-27 DIAGNOSIS — E1140 Type 2 diabetes mellitus with diabetic neuropathy, unspecified: Secondary | ICD-10-CM | POA: Insufficient documentation

## 2020-10-27 DIAGNOSIS — S0990XA Unspecified injury of head, initial encounter: Secondary | ICD-10-CM

## 2020-10-27 DIAGNOSIS — W01198A Fall on same level from slipping, tripping and stumbling with subsequent striking against other object, initial encounter: Secondary | ICD-10-CM | POA: Diagnosis not present

## 2020-10-27 DIAGNOSIS — I129 Hypertensive chronic kidney disease with stage 1 through stage 4 chronic kidney disease, or unspecified chronic kidney disease: Secondary | ICD-10-CM | POA: Insufficient documentation

## 2020-10-27 DIAGNOSIS — S0003XA Contusion of scalp, initial encounter: Secondary | ICD-10-CM | POA: Diagnosis not present

## 2020-10-27 DIAGNOSIS — Z79899 Other long term (current) drug therapy: Secondary | ICD-10-CM | POA: Insufficient documentation

## 2020-10-27 DIAGNOSIS — F17210 Nicotine dependence, cigarettes, uncomplicated: Secondary | ICD-10-CM | POA: Insufficient documentation

## 2020-10-27 DIAGNOSIS — J45909 Unspecified asthma, uncomplicated: Secondary | ICD-10-CM | POA: Insufficient documentation

## 2020-10-27 DIAGNOSIS — Z7984 Long term (current) use of oral hypoglycemic drugs: Secondary | ICD-10-CM | POA: Diagnosis not present

## 2020-10-27 DIAGNOSIS — N183 Chronic kidney disease, stage 3 unspecified: Secondary | ICD-10-CM | POA: Insufficient documentation

## 2020-10-27 DIAGNOSIS — F0390 Unspecified dementia without behavioral disturbance: Secondary | ICD-10-CM | POA: Insufficient documentation

## 2020-10-27 DIAGNOSIS — Z7982 Long term (current) use of aspirin: Secondary | ICD-10-CM | POA: Diagnosis not present

## 2020-10-27 DIAGNOSIS — W19XXXA Unspecified fall, initial encounter: Secondary | ICD-10-CM

## 2020-10-27 MED ORDER — ACETAMINOPHEN 325 MG PO TABS
650.0000 mg | ORAL_TABLET | Freq: Once | ORAL | Status: AC
  Administered 2020-10-27: 650 mg via ORAL
  Filled 2020-10-27: qty 2

## 2020-10-27 MED ORDER — IBUPROFEN 600 MG PO TABS
600.0000 mg | ORAL_TABLET | Freq: Once | ORAL | Status: AC
  Administered 2020-10-27: 600 mg via ORAL
  Filled 2020-10-27: qty 1

## 2020-10-27 NOTE — Discharge Instructions (Signed)
Patient has a small scalp contusion, bruise to the left scalp.  There is no underlying fracture or bleeding inside the brain.  Patient is able to eat and do activity as normal.  She is able to go to sleep tonight and does not need any specific neurochecks.  She does not need to be woken up through the night to be checked on.  Tylenol and/or Motrin every 6 hours as needed for headache.

## 2020-10-27 NOTE — ED Provider Notes (Signed)
[facility] Emergency Department Provider Note  ____________________________________________  Time seen: Approximately 4:03 PM  I have reviewed the triage vital signs and the nursing notes.   HISTORY  Chief Complaint Fall    HPI Christy [name] is a 65 y.o. female who presents the emergency department complaining of left-sided head pain.  Patient fell, hit her head on a wooden railing.  No loss of consciousness.  She states that when she fell she did hit her head leans on her buttocks.  No other pain complaints.  She denies any back or hip pain.  Patient states that she has not lost consciousness.  No other complaints at this time.  Patient has a history of arthritis, asthma, diabetes, GERD, hypertension, schizophrenia, dementia, neuropathy.        Past Medical History:  Diagnosis Date   Arthritis    Asthma    Diabetes mellitus without complication (HCC)    Patient takes Metformin   Edema    Genital herpes    GERD (gastroesophageal reflux disease)    Hyperlipidemia    Hypertension    Malaise and fatigue    Mental status alteration    Schizophrenia (HCC)     Patient Active Problem List   Diagnosis Date Noted   Schizophrenia (HCC) 01/16/2018   CKD (chronic kidney disease), stage III (HCC) 06/07/2017   Controlled type 2 diabetes mellitus without complication, without long-term current use of insulin (HCC) 04/19/2016   Status post abdominal hysterectomy 05/01/2015   Morbid obesity with body mass index (BMI) of 45.0 to 49.9 in adult [facility]) 05/01/2015   Acute respiratory failure (HCC) 04/28/2015   Allergic rhinitis 08/02/2014   Arthropathia 08/02/2014   CCF (congestive cardiac failure) (HCC) 08/02/2014   Current tobacco use 08/02/2014   Diabetic neuropathy (HCC) 08/02/2014   Genital herpes 08/02/2014   Acid reflux 08/02/2014   BP (high blood pressure) 08/02/2014   HLD (hyperlipidemia) 08/02/2014   Incontinence 08/02/2014   Neuropathy 08/02/2014    Drug noncompliance 08/02/2014   Adult BMI 30+ 08/02/2014   Arthritis, degenerative 08/02/2014   Delusional disorder (HCC) 08/02/2014   Avitaminosis D 08/02/2014   Can't get food down 07/16/2013    Past Surgical History:  Procedure Laterality Date   ABDOMINAL HYSTERECTOMY     due to fibroids in 1997   CHOLECYSTECTOMY     COLONOSCOPY WITH PROPOFOL N/A 08/15/2015   Procedure: COLONOSCOPY WITH PROPOFOL;  Surgeon: Christena Deem, MD;  [facility];  Service: Endoscopy;  Laterality: N/A;   ESOPHAGOGASTRODUODENOSCOPY (EGD) WITH PROPOFOL N/A 08/15/2015   Procedure: ESOPHAGOGASTRODUODENOSCOPY (EGD) WITH PROPOFOL;  Surgeon: Christena Deem, MD;  Location: [facility] ENDOSCOPY;  Service: Endoscopy;  Laterality: N/A;   FOOT SURGERY Right    GANGLION CYST EXCISION     TONSILLECTOMY     UPPER GI ENDOSCOPY  07/31/13   mild chronic inflammation and reactive gastropathy-no need for another EGD repeat    Prior to Admission medications   Medication Sig Start Date End Date Taking? Authorizing Provider  ACCU-CHEK SOFTCLIX LANCETS lancets Check sugar once daily  DX E11.9 01/10/17   Maple Hudson., MD  acetaminophen (TYLENOL) 500 MG tablet Take 2 tablets by mouth in the morning, at noon, and at bedtime.    [provider]  aspirin 81 MG EC tablet Take 1 tablet by mouth daily.    [provider]  dicyclomine (BENTYL) 10 MG capsule Take 1 capsule (10 mg total) by mouth 2 (two) times daily.  09/09/17 09/10/20  Maple Hudson., MD  diphenoxylate-atropine (LOMOTIL) 2.5-0.025 MG tablet Take 1 tablet by mouth 2 (two) times daily as needed for diarrhea or loose stools.    [provider]  FLUPHENAZINE DECANOATE IJ Inject 25 mg into the muscle every 21 ( twenty-one) days.    [provider]  furosemide (LASIX) 40 MG tablet Take 1 tablet (40 mg total) by mouth 2 (two) times daily. 04/28/18   Maple Hudson., MD  glimepiride (AMARYL) 4 MG tablet Two tablets  daily with breakfast 11/23/17   Maple Hudson., MD  glucose blood (ACCU-CHEK AVIVA) test strip Check sugar once daily DX E11.9 12/21/16   Maple Hudson., MD  Lancet Devices [facility]) lancets Check sugar once daily, DX E11.9 08/13/15   Maple Hudson., MD  loratadine (CLARITIN) 10 MG tablet Take 1 tablet (10 mg total) by mouth daily. 08/17/18   Maple Hudson., MD  losartan (COZAAR) 50 MG tablet Take 1 tablet (50 mg total) by mouth daily. 07/25/18   Maple Hudson., MD  Menthol, Topical Analgesic, (ICY HOT ORIGINAL PAIN RELIEF) 2.5 % GEL Apply 1 application topically in the morning, at noon, and at bedtime. To both knees    [provider]  metFORMIN (GLUCOPHAGE) 1000 MG tablet Take 1 tablet (1,000 mg total) by mouth 2 (two) times daily with a meal. 07/18/18   Maple Hudson., MD  metoprolol succinate (TOPROL-XL) 25 MG 24 hr tablet Take 0.5 tablets (12.5 mg total) by mouth daily. 11/10/17   Maple Hudson., MD  Multiple Vitamins-Minerals (MULTI-VITAMIN GUMMIES) CHEW Chew 2 tablets by mouth daily at 12 noon.    [provider]  olopatadine (PATANOL) 0.1 % ophthalmic solution Place 1 drop into both eyes daily at 12 noon.    [provider]  oxybutynin (DITROPAN) 5 MG tablet Take 1 tablet by mouth in the morning, at noon, and at bedtime.    [provider]  pantoprazole (PROTONIX) 40 MG tablet Take 40 mg by mouth daily.    [provider]  potassium chloride (KLOR-CON) 10 MEQ tablet Take 1 tablet by mouth daily.    [provider]  simvastatin (ZOCOR) 10 MG tablet Take 1 tablet (10 mg total) by mouth daily. 01/20/18   Maple Hudson., MD  traMADol Janean Sark) 50 MG tablet Take 1 tablet by mouth in the morning, at noon, and at bedtime.  Give 1 tablet q 12h prn breakthrough pain. Hold for lethargy    [provider]  trihexyphenidyl (ARTANE) 2 MG tablet Take 1 tablet by mouth daily.     [provider]  valACYclovir (VALTREX) 1000 MG tablet Take 1 tablet (1,000 mg total) by mouth daily. 05/24/18   Maple Hudson., MD  vitamin B-12 (CYANOCOBALAMIN) 1000 MCG tablet Take 1,000 mcg by mouth daily.    [provider]    Allergies Benzoyl peroxide, Cephalexin, Peroxide [hydrogen peroxide], and Shellfish allergy  Family History  Problem Relation Age of Onset   Diabetes Mother    Pulmonary embolism Mother    Diabetes Father     Social History Social History   Tobacco Use   Smoking status: Some Days    Packs/day: 0.00    Types: Cigarettes   Smokeless tobacco: Never   Tobacco comments:    only smokes sometimes  Substance Use Topics   Alcohol use: Yes    Comment: beer occasionally  Drug use: No     Review of Systems  Constitutional: No fever/chills Eyes: No visual changes. No discharge ENT: No upper respiratory complaints. Cardiovascular: no chest pain. Respiratory: no cough. No SOB. Gastrointestinal: No abdominal pain.  No nausea, no vomiting.  No diarrhea.  No constipation. Genitourinary: Negative for dysuria. No hematuria Musculoskeletal: Negative for musculoskeletal pain. Skin: Negative for rash, abrasions, lacerations, ecchymosis. Neurological: Patient hit her head and a mechanical fall but denies headaches, focal weakness or numbness.  10 System ROS otherwise negative.  ____________________________________________   PHYSICAL EXAM:  VITAL SIGNS: ED Triage Vitals  Enc Vitals Group     BP 10/27/20 1536 125/75     Pulse Rate 10/27/20 1536 87     Resp 10/27/20 1536 18     Temp 10/27/20 1536 98 F (36.7 C)     Temp src --      SpO2 10/27/20 1536 92 %     Weight 10/27/20 1558 279 lb 15.8 oz (127 kg)     Height 10/27/20 1558 5' 9.5" (1.765 m)     Head Circumference --      Peak Flow --      Pain Score 10/27/20 1535 5     Pain Loc --      Pain Edu? --      Excl. in GC? --      Constitutional: Alert and oriented.  Well appearing and in no acute distress. Eyes: Conjunctivae are normal. PERRL. EOMI. Head: Atraumatic.  No abrasions, lacerations, hematomas identified.  Patient is tender to palpation over the left occipital/parietal skull region.  No palpable abnormality or crepitus.  No battle signs, raccoon eyes, serosanguineous fluid drainage from the ears or nares. ENT:      Ears:       Nose: No congestion/rhinnorhea.      Mouth/Throat: Mucous membranes are moist.  Neck: No stridor.  No cervical spine tenderness to palpation. Cardiovascular: Normal rate, regular rhythm. Normal S1 and S2.  Good peripheral circulation. Respiratory: Normal respiratory effort without tachypnea or retractions. Lungs CTAB. Good air entry to the bases with no decreased or absent breath sounds. Musculoskeletal: Full range of motion to all extremities. No gross deformities appreciated. Neurologic:  Normal speech and language. No gross focal neurologic deficits are appreciated.  Skin:  Skin is warm, dry and intact. No rash noted. Psychiatric: Mood and affect are normal. Speech and behavior are normal. Patient exhibits appropriate insight and judgement.   ____________________________________________   LABS (all labs ordered are listed, but only abnormal results are displayed)  Labs Reviewed - No data to display ____________________________________________  EKG   ____________________________________________  RADIOLOGY I personally viewed and evaluated these images as part of my medical decision making, as well as reviewing the written report by the radiologist.  ED Provider Interpretation: No acute traumatic findings on CT head and neck  EXAM: CT HEAD WITHOUT CONTRAST  CT CERVICAL SPINE WITHOUT CONTRAST  TECHNIQUE: Multidetector CT imaging of the head and cervical spine was performed following the standard protocol without intravenous contrast. Multiplanar CT image reconstructions of the cervical spine were also  generated.  COMPARISON: MRI 03/18/2020  FINDINGS: CT HEAD FINDINGS  Brain: No acute territorial infarction, hemorrhage or intracranial. Mild atrophy. Mild chronic small vessel ischemic changes of the white matter. Nonenlarged ventricles  Vascular: No hyperdense vessels. Scattered carotid vascular calcification  Skull: Normal. Negative for fracture or focal lesion.  Sinuses/Orbits: No acute finding.  Other: None  CT CERVICAL SPINE FINDINGS  Alignment:  Mild reversal of cervical lordosis. No subluxation. Facet alignment within normal  Skull base and vertebrae: No acute fracture. No primary bone lesion or focal pathologic process.  Soft tissues and spinal canal: No prevertebral fluid or swelling. No visible canal hematoma.  Disc levels: Moderate severe diffuse degenerative changes throughout the cervical spine with disc space narrowing osteophyte. Foraminal narrowing at multiple levels.  Upper chest: Negative.  Other: None  IMPRESSION: 1. No CT evidence for acute intracranial abnormality. Atrophy and chronic small vessel ischemic changes of the white matter. 2. Mild reversal of cervical lordosis with degenerative changes. No acute osseous abnormality   Electronically Signed By: Jasmine Pang M.D. On: 10/27/2020 16:51  No results found.  ____________________________________________    PROCEDURES  Procedure(s) performed:    Procedures    Medications  acetaminophen (TYLENOL) tablet 650 mg (has no administration in time range)  ibuprofen (ADVIL) tablet 600 mg (has no administration in time range)     ____________________________________________   INITIAL IMPRESSION / ASSESSMENT AND PLAN / ED COURSE  Pertinent labs & imaging results that were available during my care of the patient were reviewed by me and considered in my medical decision making (see chart for details).  Review of the [place] CSRS was performed in accordance of the [facility] prior to  dispensing any controlled drugs.           Patient's diagnosis is consistent with fall, scalp contusion.  Patient presented to the emergency department after what appears to be a mechanical fall.  Patient does have a history of dementia and MR.  However she is able to answer all questions appropriately.  She states that she tripped, hit the left side of her head against a wooden railing.  No reported loss of consciousness.  She states that in the fall she hit her head but landed on her buttocks.  No other complaint.  She is ambulatory here.  Imaging was reassuring with no acute traumatic findings..  I discussed the patient with her guardian, prior to discharge.  Instructions are given to the guardian and will be printed on the paperwork as well.  Tylenol and/or Motrin at home as needed for pain.  Follow-up with primary care as needed.  Patient is given ED precautions to return to the ED for any worsening or new symptoms.     ____________________________________________  FINAL CLINICAL IMPRESSION(S) / ED DIAGNOSES  Final diagnoses:  Fall, initial encounter  Minor head injury, initial encounter  Contusion of scalp, initial encounter      NEW MEDICATIONS STARTED DURING THIS VISIT:  ED Discharge Orders     None           This chart was dictated using voice recognition software/Dragon. Despite best efforts to proofread, errors can occur which can change the meaning. Any change was purely unintentional.    Racheal Patches, PA-C 10/27/20 1741    Gilles Chiquito, MD 10/27/20 [id number]

## 2020-10-27 NOTE — ED Triage Notes (Signed)
Pt comes via EMs from [facility] and fell off her walker. Pt states she hit her head on hand rail no loc. Pt has dementia.

## 2020-10-27 NOTE — ED Triage Notes (Signed)
Ems from [facility].  Fell and hit head on hand rail.   History of demntia and parkinsons.  Staff said no loc.  On aspirin.

## 2020-11-03 ENCOUNTER — Non-Acute Institutional Stay: Payer: Medicare Other | Admitting: Primary Care

## 2020-11-03 ENCOUNTER — Other Ambulatory Visit: Payer: Self-pay

## 2020-11-03 DIAGNOSIS — N3946 Mixed incontinence: Secondary | ICD-10-CM

## 2020-11-03 DIAGNOSIS — Z515 Encounter for palliative care: Secondary | ICD-10-CM

## 2020-11-03 DIAGNOSIS — I509 Heart failure, unspecified: Secondary | ICD-10-CM

## 2020-11-03 DIAGNOSIS — E119 Type 2 diabetes mellitus without complications: Secondary | ICD-10-CM

## 2020-11-03 DIAGNOSIS — M158 Other polyosteoarthritis: Secondary | ICD-10-CM

## 2020-11-03 NOTE — Progress Notes (Addendum)
[facility] Pall[facility] Consult Note Telephone: [phone]  Fax: [phone]    Date of encounter: 11/03/20 2:35 PM PATIENT NAME: Christy [name] [place] [address]   [phone] (home)  DOB: January 14, 1956 MRN: [id number] PRIMARY CARE PROVIDER:    Bonnita Nasuti, MD,  [address] [place] [address] [phone]  REFERRING PROVIDER:   Bonnita Nasuti, MD [address] [place],  [place] [address] [phone]  RESPONSIBLE PARTY:    Contact Information     Name Relation Home Work Mobile   Christy Branch Relative [place], [place] Relative   [place] [phone]          I met face to face with patient Christy [name]. Palliative Care was asked to follow this patient by consultation request of  Hague, Rosalyn Charters, MD to address advance care planning and complex medical decision making. This is a follow up visit.                                   ASSESSMENT AND PLAN / RECOMMENDATIONS:   Advance Care Planning/Goals of Care: Goals include to maximize quality of life and symptom management.  CODE STATUS: Full, no directives on record.  Symptom Management/Plan:  Falls: Patient was in hallway and was rising from a seated position to stand, however rollator was not locked and patient suffered a fall and hit her head. Patient did not suffer a loss of consciousness ; however did received CT evaluation at ED. No acute findings at that time. Denies nausea, vomiting. Did suffer headaches, which she reports PRN Tylenol has been effective in treating. Suffers from chronic Bilateral Knee Pain. She reports knee pain is progressing. Uses icy hot and bilateral knee braces. Is participating in PT currently with [facility]. She is scheduled for bilateral knee injections, and reports  if failed she will require knee replacements. \  Discussed procedure with patient who seems to have  a very clear perspective of surgery course and rehab. She has had cortisone injections in the past, which she feels where minimally effective. Recommended wheelchair use intermittently during periods of severe pain or discomfort. Patient reports difficulty rising from lying position to sitting and with position changes. Discussed Hospital Bed with rail to assist with positioning. Per Neurology recommendations were made for Sinemet 25/100 mg tablet recommend considering this in the future to help with Physical Therapy. Today she exhibits pronounced pill rolling tremors bilaterally.  Spoke with Christy [name] (Christy [name]): Goal is to see if patient will improve with knee injections, however facility does not want to limit patient to wheelchair at this time. Discussed benefits of wheelchair for patients current status. Christy [name] expressed concern of patients gradual cognitive decline and potential need for memory care in the near future.  I recommend a BSC as patient reports incontinence on trying to get to bathroom. Recommend hospital bed to increase transfer mobility.  Bowel and Bladder Incontinence: Reports difficulty getting to the bathroom timely due to knee pain and stiffness. Patient states she utilized [facility] when living in her apartment which she felt was helpful. Continues on Furosemide and Lomotil. Endorses frequent toileting needs. Discussed BSC to assist with toileting needs. Cousin is HCPOA  Follow up Palliative Care Visit: Palliative care will continue to follow for complex medical decision making, advance care planning, and clarification of goals.  Return 6 weeks or prn.  I spent 60 minutes providing this consultation. More than 50% of the time in this consultation was spent in counseling and care coordination.  PPS: 40%  HOSPICE ELIGIBILITY/DIAGNOSIS: no  Chief Complaint: immobility, incontinence  HISTORY OF PRESENT ILLNESS:  Christy [name] is a 65 y.o. year old female  with delusional   disorder, CKD, CHF, DM, obesity, OA with chronic pain. [name]   History obtained from review of EMR, discussion with primary team, and interview with family, facility staff/caregiver and/or Christy [name].  I reviewed available labs, medications, imaging, studies and related documents from the EMR.  Records reviewed and summarized above.   ROS   General: NAD Pulmonary: denies cough, denies increased SOB Abdomen: endorses good appetite, denies constipation, endorses incontinence of bowel GU: denies dysuria, endorses incontinence of urine MSK:  endorses knee weakness,  1 fall reported Skin: denies rashes or wounds Neurological: endorses OA pain, denies insomnia Psych: Endorses positive mood Heme/lymph/immuno: denies bruises, abnormal bleeding  Physical Exam: Current and past weights: 290 lbs Constitutional: NAD General: frail appearing, obese  EYES: anicteric sclera, lids intact, no discharge  ENMT: intact hearing, oral mucous membranes moist CV: no LE edema Pulmonary:  no increased work of breathing, no cough, room air Abdomen: intake 100%, no ascites GU: deferred MSK: no sarcopenia, moves all extremities, ambulatory with walker, fall risk Skin: warm and dry, no rashes or wounds on visible skin Neuro:  ++ generalized weakness,  ++ cognitive impairment Psych: non-anxious affect, A and O x 2-3 Hem/lymph/immuno: no widespread bruising  Thank you for the opportunity to participate in the care of Christy [name].  The palliative care team will continue to follow. Please call our office at [phone] if we can be of additional assistance.   Christy Coop, NP   COVID-19 PATIENT SCREENING TOOL Asked and negative response unless otherwise noted:   Have you had symptoms of covid, tested positive or been in contact with someone with symptoms/positive test in the past 5-10 days?

## 2020-12-26 ENCOUNTER — Encounter: Payer: Medicare Other | Admitting: Primary Care

## 2020-12-26 ENCOUNTER — Other Ambulatory Visit: Payer: Self-pay

## 2020-12-29 ENCOUNTER — Encounter: Payer: Medicare Other | Admitting: Primary Care

## 2020-12-29 ENCOUNTER — Other Ambulatory Visit: Payer: Self-pay

## 2020-12-30 ENCOUNTER — Other Ambulatory Visit: Payer: Self-pay

## 2020-12-30 ENCOUNTER — Non-Acute Institutional Stay: Payer: Medicare Other | Admitting: Primary Care

## 2020-12-30 DIAGNOSIS — F22 Delusional disorders: Secondary | ICD-10-CM

## 2020-12-30 DIAGNOSIS — Z6841 Body Mass Index (BMI) 40.0 and over, adult: Secondary | ICD-10-CM

## 2020-12-30 DIAGNOSIS — Z515 Encounter for palliative care: Secondary | ICD-10-CM

## 2020-12-30 NOTE — Progress Notes (Signed)
[facility] Consult Note Telephone: [phone]  Fax: [phone]    Date of encounter: 12/30/20 10:30 AM PATIENT NAME: Christy [name] [place] [address]   [phone] (home)  DOB: Jan 27, 1955 MRN: [id number] PRIMARY CARE PROVIDER:    Bonnita Nasuti, MD,  [address] [place] [address] [phone]  REFERRING PROVIDER:   Bonnita Nasuti, MD [address] [place],  [place] [address] [phone]  RESPONSIBLE PARTY:    Contact Information     Name Relation Home Work Mobile   Christy [name] Relative Christy [name], [place] Relative   [place] [phone]          I met face to face with patient in [facility]. Palliative Care was asked to follow this patient by consultation request of  Hague, Rosalyn Charters, MD to address advance care planning and complex medical decision making. This is a follow up visit.                                   ASSESSMENT AND PLAN / RECOMMENDATIONS:   Advance Care Planning/Goals of Care: Goals include to maximize quality of life and symptom management. Our advance care planning conversation included a discussion about:    The value and importance of advance care planning  Exploration of personal, cultural or spiritual beliefs that might influence medical decisions  Exploration of goals of care in the event of a sudden injury or illness  Identification of a healthcare agent - Christy [name] and Christy [name] Review and  creation of an  advance directive document . CODE STATUS: FULL CODE I spoke with Christy [name] afterwards to discuss MOST being at ALF for her to review with patient. It is not signed as I was not sure if pt had legal guardian. Christy [name] said she would review. I will leave a blank form in case they choose any different choices than pt did with me today.  Symptom Management/Plan:  Pain: States she's had injections in bil knees, states 9/10  pain in bil knees and was a 10/10 before injections. States pain at all times, even with off loading. Walks to meals and TV room.   Would benefit from ATC Tylenol CR.  Nutrition: Appetite is good, states well. Chart states 279 lbs in 10/22,  now 260 lbs however. Controlled weight loss would benefit her joint pain.  Mood: In an upbeat mood today, able to rise and interact. States she does go to the social room some  but also likes her privacy. Has own TV which she watches.  Follow up Palliative Care Visit: Palliative care will continue to follow for complex medical decision making, advance care planning, and clarification of goals. Return 6 weeks or prn.  I spent 40 minutes providing this consultation. More than 50% of the time in this consultation was spent in counseling and care coordination.  PPS: 40%  HOSPICE ELIGIBILITY/DIAGNOSIS: TBD  Chief Complaint: knee pain bil  HISTORY OF PRESENT ILLNESS:  Christy [name] is a 65 y.o. year old female  with OA, chronic pain, delusional disorder.  History obtained from review of EMR, discussion with primary team, and interview with family, facility staff/caregiver and/or Ms. Lafosse.  I reviewed available labs, medications, imaging, studies and related documents from the EMR.  Records reviewed and summarized above.   ROS  General: NAD EYES: denies  vision changes ENMT: denies dysphagia Cardiovascular: denies chest pain, denies DOE Pulmonary: denies cough, denies increased SOB Abdomen: endorses good appetite, denies constipation, endorses continence of bowel GU: denies dysuria, endorses continence of urine MSK:  endorses  weakness,  no [name] reported Skin: denies rashes or wounds Neurological: endorses constant knee pain, denies insomnia Psych: Endorses positive mood Heme/lymph/immuno: denies bruises, abnormal bleeding  Physical Exam: Current and past weights: 260 lbs today, was 279 per chart in 10/22.  Constitutional: NAD General: frail  appearing, obese  EYES: anicteric sclera, lids intact, no discharge  ENMT: intact hearing, oral mucous membranes moist CV: slight LE edema Pulmonary:no increased work of breathing, no cough, room air Abdomen: intake 100%,  no ascites GU: deferred MSK: no sarcopenia, moves all extremities, ambulatory with walker  Skin: warm and dry, no rashes or wounds on visible skin Neuro:  +  generalized weakness,  + cognitive impairment Psych: non-anxious affect, A and O x 2 Hem/lymph/immuno: no widespread bruising  Thank you for the opportunity to participate in the care of Ms. Ertle.  The palliative care team will continue to follow. Please call our office at [phone] if we can be of additional assistance.   Jason Coop, NP DNP, AGPCNP-BC  COVID-19 PATIENT SCREENING TOOL Asked and negative response unless otherwise noted:   Have you had symptoms of covid, tested positive or been in contact with someone with symptoms/positive test in the past 5-10 days?

## 2021-01-22 ENCOUNTER — Other Ambulatory Visit: Payer: Self-pay

## 2021-01-22 ENCOUNTER — Non-Acute Institutional Stay: Payer: Medicare Other | Admitting: Primary Care

## 2021-01-22 DIAGNOSIS — Z515 Encounter for palliative care: Secondary | ICD-10-CM

## 2021-01-22 DIAGNOSIS — I509 Heart failure, unspecified: Secondary | ICD-10-CM

## 2021-01-22 NOTE — Progress Notes (Signed)
[facility] Consult Note Telephone: [phone]  Fax: [phone]    Date of encounter: 01/22/21 PATIENT NAME: Christy [name] Christy [name] [address]   [phone] (home)  DOB: 08-11-1955 MRN: [id number] PRIMARY CARE PROVIDER:    Bonnita Nasuti, MD,  [address] [place] [address] [phone]  REFERRING PROVIDER:   Bonnita Nasuti, MD [address] [place],  [place] [address] [phone]  RESPONSIBLE PARTY:    Contact Information     Name Relation Home Work Mobile   [place] Relative [place], [place] Relative   [place] [phone]          I met face to face with patient in [facility]. Palliative Care was asked to follow this patient by consultation request of  Hague, Rosalyn Charters, MD to address advance care planning.                                   ASSESSMENT AND PLAN / RECOMMENDATIONS:   Advance Care Planning/Goals of Care: Goals include to maximize quality of life and symptom management. Our advance care planning conversation included a discussion about:    The value and importance of advance care planning  Exploration of personal, cultural or spiritual beliefs that might influence medical decisions  Exploration of goals of care in the event of a sudden injury or illness  Identification of a healthcare agent - [name]  creation of an  advance directive document . CODE STATUS: FULL CODE  I met with patient in ALF  home room. She was resting comfortably. She has reviewed her advanced planning with her family member,  POA [name], and with me. Today we were finishing finalizing her MOST form. Patient endorses  today good quality of life and that she chooses his full scope of measures of treatment. She has selected full scope of treatment including resuscitation, highest level of interventions ,use of antibiotics,  limited IVs and feeding tubes.  She had no additional questions. Poa had requested several copies and those have been left in her bedside drawer per [name]. Advance planning was discussed with staff and placed in her medical record. Also her documents will be uploaded to [facility].   I completed a MOST form today. The patient and family outlined their wishes for the following treatment decisions:  Cardiopulmonary Resuscitation: Attempt Resuscitation (CPR)  Medical Interventions: Full Scope of Treatment: Use intubation, advanced airway interventions, mechanical ventilation, cardioversion as indicated, medical treatment, IV fluids, etc, also provide comfort measures. Transfer to the hospital if indicated  Antibiotics: Antibiotics if indicated  IV Fluids: IV fluids for a defined trial period  Feeding Tube: Feeding tube for a defined trial period    Follow up Palliative Care Visit: Palliative care will continue to follow for complex medical decision making, advance care planning, and clarification of goals. Return 6 weeks or prn.  I spent 20 minutes providing this consultation. More than 50% of the time in this consultation was spent in counseling and care coordination.  Thank you for the opportunity to participate in the care of [name].  The palliative care team will continue to follow. Please call our office at [phone] if we can be of additional assistance.   Jason Coop, NP ,   COVID-19 PATIENT SCREENING TOOL Asked and negative response unless otherwise  noted:   Have you had symptoms of covid, tested positive or been in contact with someone with symptoms/positive test in the past 5-10 days?

## 2021-04-02 ENCOUNTER — Other Ambulatory Visit: Payer: Self-pay | Admitting: Obstetrics

## 2021-04-02 DIAGNOSIS — Z1231 Encounter for screening mammogram for malignant neoplasm of breast: Secondary | ICD-10-CM

## 2021-04-13 ENCOUNTER — Ambulatory Visit
Admission: RE | Admit: 2021-04-13 | Discharge: 2021-04-13 | Disposition: A | Discharge status: Home / Self Care | Payer: Medicare Other | Source: Ambulatory Visit | Attending: Obstetrics | Admitting: Obstetrics

## 2021-04-13 ENCOUNTER — Other Ambulatory Visit: Payer: Self-pay

## 2021-04-13 DIAGNOSIS — Z1231 Encounter for screening mammogram for malignant neoplasm of breast: Secondary | ICD-10-CM | POA: Insufficient documentation

## 2021-04-20 ENCOUNTER — Other Ambulatory Visit: Payer: Self-pay | Admitting: Obstetrics

## 2021-04-20 DIAGNOSIS — N6489 Other specified disorders of breast: Secondary | ICD-10-CM

## 2021-04-20 DIAGNOSIS — R928 Other abnormal and inconclusive findings on diagnostic imaging of breast: Secondary | ICD-10-CM

## 2021-04-22 ENCOUNTER — Non-Acute Institutional Stay: Payer: Medicare Other | Admitting: Primary Care

## 2021-04-22 ENCOUNTER — Other Ambulatory Visit: Payer: Self-pay

## 2021-04-22 DIAGNOSIS — N3946 Mixed incontinence: Secondary | ICD-10-CM

## 2021-04-22 DIAGNOSIS — E119 Type 2 diabetes mellitus without complications: Secondary | ICD-10-CM

## 2021-04-22 DIAGNOSIS — I509 Heart failure, unspecified: Secondary | ICD-10-CM

## 2021-04-22 DIAGNOSIS — Z515 Encounter for palliative care: Secondary | ICD-10-CM

## 2021-04-22 NOTE — Progress Notes (Signed)
? ? ?[facility] ?[facility] Palliative [facility] Consult Note ?Telephone: [phone]  ?Fax: [phone]  ? ? ?Date of encounter: 04/22/21 ?1:42 PM ?PATIENT NAME: Christy [name] ?[place] [address]   ?[phone] (home)  ?DOB: 06-06-1955 ?MRN: [id number] ?PRIMARY CARE PROVIDER:    ?Verl Blalock, NP,  ?[address] ?[place] [address] ?[phone] ? ?REFERRING PROVIDER:   ?Verl Blalock, NP ?[address] ?[place],  [place] [address] ?[phone] ? ?RESPONSIBLE PARTY:    ?Contact Information   ? ? Name Relation Home Work Mobile  ? Jenkins,Sheena Relative [phone]    ? Saunders Glance Relative   [phone]  ? Allen,Dorene Friend [phone]    ? ?  ? ? ? ?I met [facility] to [facility]. Palliative Care was asked to follow this patient by consultation request of  Verl Blalock, NP to address advance care planning and complex medical decision making. This is a follow up visit. ? ?                                 ASSESSMENT AND PLAN / RECOMMENDATIONS:  ? ?Advance Care Planning/Goals of Care: Goals include to maximize quality of life and symptom management. Patient/health care surrogate gave his/her permission to discuss.Our advance care planning conversation included a discussion about:    ? ?Identification of a healthcare agent - her Christy [name] ?Patient does not have capacity to make own decisions ? ?CODE STATUS: FULL CODE ? ?Symptom Management/Plan: ? ?I met with patient in her assisted living. She was moved to another building for increased cognitive decline. Today she seems at her baseline, if not somewhat better. She is dressed and sitting up in her room, very interactive and with spirited discussion. She states she's visited with her cousin recently, who comes from [place]. She likes her new room and she has a new roommate that  she enjoys. She endorses at her knees continue to be painful which is an ongoing  issue. I am not aware of any surgical plans and would not feel that she could do substantive rehab due to cognition at this writing. She seems to have less pain and endorses  using some sort of topical analgesia sporadically. I would recommend TID use of Voltaren 1% and I'm happy to prescribe that if the facility desires ? ? ?Follow up Palliative Care Visit: Palliative care will continue to follow for complex medical decision making, advance care planning, and clarification of goals. Return 8 weeks or prn. ? ?This visit was coded based on medical decision making (MDM). ? ?PPS: 40% ? ?HOSPICE ELIGIBILITY/DIAGNOSIS: TBD ? ?Chief Complaint: debility, knee pain ? ?HISTORY OF PRESENT ILLNESS:  Christy [name] is a 66 y.o. year old female  with OA, obesity, knee pain, immobility . Patient seen today to review palliative care needs to include medical decision making and advance care planning as appropriate.  ? ?History obtained from review of EMR, discussion with primary team, and interview with family, facility staff/caregiver and/or Christy [name].  ?I reviewed available labs, medications, imaging, studies and related documents from the EMR.  Records reviewed and summarized above.  ? ?ROS ? ?General: NAD ?EYES: denies vision changes ?ENMT: denies dysphagia ?Cardiovascular: denies chest pain, denies DOE ?Pulmonary: denies cough, denies increased SOB ?Abdomen: endorses good appetite, denies constipation, endorses continence of bowel (has had some explosive  diarrhea) ?GU: denies dysuria, endorses  occ incontinence of urine ?MSK:  denies  increased weakness,  no falls reported ?Skin: denies rashes or wounds ?Neurological: endorses chronic bil knee  pain, denies insomnia ?Psych: Endorses positive mood ? ?Physical Exam: ?Current and past weights: 279 lbs per record, unable to assess today. ?Constitutional: NAD ?General: frail appearing, obese  ?EYES: anicteric sclera, lids intact, no discharge  ?ENMT: intact hearing, oral mucous  membranes moist ?CV: no LE edema ?Pulmonary: no increased work of breathing, no cough, room air ?Abdomen: intake 100%,  no ascites ?MSK: no sarcopenia, moves all extremities, ambulatory with walker  ?Skin: warm and dry, no rashes or wounds on visible skin ?Neuro:  no new  generalized weakness,  ++ cognitive impairment, non-anxious affect ? ? ?Thank you for the opportunity to participate in the care of Christy [name].  The palliative care team will continue to follow. Please call our office at [phone] if we can be of additional assistance.  ? ?Jason Coop, NP DNP, AGPCNP-BC ? ?COVID-19 PATIENT SCREENING TOOL ?Asked and negative response unless otherwise noted:  ? ?Have you had symptoms of covid, tested positive or been in contact with someone with symptoms/positive test in the past 5-10 days?  ? ?

## 2021-05-07 ENCOUNTER — Ambulatory Visit
Admission: RE | Admit: 2021-05-07 | Discharge: 2021-05-07 | Disposition: A | Discharge status: Home / Self Care | Payer: Medicare Other | Source: Ambulatory Visit | Attending: Obstetrics | Admitting: Obstetrics

## 2021-05-07 ENCOUNTER — Other Ambulatory Visit: Payer: Self-pay | Admitting: Obstetrics

## 2021-05-07 DIAGNOSIS — N6489 Other specified disorders of breast: Secondary | ICD-10-CM | POA: Insufficient documentation

## 2021-05-07 DIAGNOSIS — R928 Other abnormal and inconclusive findings on diagnostic imaging of breast: Secondary | ICD-10-CM | POA: Diagnosis present

## 2021-06-05 ENCOUNTER — Other Ambulatory Visit: Payer: Medicare Other | Admitting: Primary Care

## 2021-06-29 NOTE — Progress Notes (Signed)
Patient unavailable upon visit to ALF for scheduled visit. Will reschedule.

## 2021-08-13 ENCOUNTER — Other Ambulatory Visit: Payer: Medicare Other | Admitting: Primary Care

## 2021-08-13 DIAGNOSIS — E119 Type 2 diabetes mellitus without complications: Secondary | ICD-10-CM

## 2021-08-13 DIAGNOSIS — F200 Paranoid schizophrenia: Secondary | ICD-10-CM

## 2021-08-13 DIAGNOSIS — Z515 Encounter for palliative care: Secondary | ICD-10-CM

## 2021-08-13 NOTE — Progress Notes (Signed)
[facility] Consult Note Telephone: [phone]  Fax: [phone]    Date of encounter: 08/13/21 11:14 AM PATIENT NAME: Christy [name] [place] [address]   [phone] (home)  DOB: 03/19/1955 MRN: [id number] PRIMARY CARE PROVIDER:    Verl Blalock, NP,  [address] [place] [address] [phone]  REFERRING PROVIDER:   Verl Blalock, NP [address],  [place] [address] [phone]  RESPONSIBLE PARTY:    Contact Information     Name Relation Home Work Mobile   Christy [name] Relative [place], [place] Relative   [phone]   Virgel Paling [phone]         I met face to face with patient in [facility] facility. Palliative Care was asked to follow this patient by consultation request of  Christy Blalock, NP to address advance care planning and complex medical decision making. This is a follow up visit.                                   ASSESSMENT AND PLAN / RECOMMENDATIONS:   Advance Care Planning/Goals of Care: Goals include to maximize quality of life and symptom management. Patient/health care surrogate gave his/her permission to discuss.Our advance care planning conversation included a discussion about:     Exploration of goals of care in the event of a sudden injury or illness  Identification of a healthcare agent - cousin Christy [name] Review of an  advance directive document . CODE STATUS: FULL Scope on record  Symptom Management/Plan:  Mobility; remains poor due to OA and pain. Is able to ambulate to bathroom with walker. We had discussed w/c some time ago but family wanted to hold off to keep her mobile. Staff endorses she sleeps a lot if give the chance but does come out to meals and activities. I will therefore continue to monitor as she is able to ambulate with rollator.  Pain; Recommend having a prn tramadol, which is on file  per staff. I also recommend acetaminophen CR (arthritis) 1300 mg q 8 hrs, instead of 1000 mg IR TID. Recommend up to date liver functions if not available.  Follow up Palliative Care Visit: Palliative care will continue to follow for complex medical decision making, advance care planning, and clarification of goals. Return 8 weeks or prn.  This visit was coded based on medical decision making (MDM).  PPS: 40%  HOSPICE ELIGIBILITY/DIAGNOSIS: TBD  Chief Complaint: pain in knees  HISTORY OF PRESENT ILLNESS:  Christy [name] is a 66 y.o. year old female  with schizophrenia, dyskinesia, pain in knees from OA . Patient seen today to review palliative care needs to include medical decision making and advance care planning as appropriate.   History obtained from review of EMR, discussion with primary team, and interview with family, facility staff/caregiver and/or Christy [name].  I reviewed available labs, medications, imaging, studies and related documents from the EMR.  Records reviewed and summarized above.   ROS   General: NAD ENMT: denies dysphagia Cardiovascular: denies chest pain, denies DOE Pulmonary: denies cough, denies increased SOB Abdomen: endorses good appetite, denies constipation, endorses  occ in -continence of bowel GU: denies dysuria, endorses  occ incontinence of urine MSK:  denies  increased weakness,  no falls reported Skin: denies rashes or wounds Neurological: endorses bil knee  pain, denies insomnia (oversleeps) Psych: Endorses positive mood  Physical Exam: Current and past weights: unavailable Constitutional: NAD General: frail appearing, obese  EYES: anicteric sclera, lids intact, no discharge  ENMT: intact hearing, oral mucous membranes moist CV: no LE edema Pulmonary:  no increased work of breathing, no cough, room air Abdomen: intake 100%,  soft and non tender, no ascites MSK: mild  sarcopenia, moves all extremities, ambulatory with walker  Skin: warm and dry,  no rashes or wounds on visible skin Neuro:  no  increased generalized weakness,  + cognitive impairment, non-anxious affect   Thank you for the opportunity to participate in the care of Christy [name].  The palliative care team will continue to follow. Please call our office at [phone] if we can be of additional assistance.   Christy Coop, NP DNP, AGPCNP-BC  COVID-19 PATIENT SCREENING TOOL Asked and negative response unless otherwise noted:   Have you had symptoms of covid, tested positive or been in contact with someone with symptoms/positive test in the past 5-10 days?

## 2021-10-01 ENCOUNTER — Ambulatory Visit: Payer: Medicare Other | Attending: Specialist

## 2021-10-01 DIAGNOSIS — G4733 Obstructive sleep apnea (adult) (pediatric): Secondary | ICD-10-CM | POA: Diagnosis present

## 2021-12-08 ENCOUNTER — Other Ambulatory Visit: Payer: Medicare Other | Admitting: Primary Care

## 2021-12-08 DIAGNOSIS — F200 Paranoid schizophrenia: Secondary | ICD-10-CM

## 2021-12-08 DIAGNOSIS — M170 Bilateral primary osteoarthritis of knee: Secondary | ICD-10-CM

## 2021-12-08 DIAGNOSIS — F22 Delusional disorders: Secondary | ICD-10-CM

## 2021-12-08 DIAGNOSIS — I509 Heart failure, unspecified: Secondary | ICD-10-CM

## 2021-12-08 DIAGNOSIS — Z515 Encounter for palliative care: Secondary | ICD-10-CM

## 2021-12-08 NOTE — Progress Notes (Signed)
[facility] Pall[facility] Consult Note Telephone: [phone]  Fax: [phone]    Date of encounter: 12/08/21 3:15 PM PATIENT NAME: Christy [name] [place] [address]   [phone] (home)  DOB: 1955-10-08 MRN: [id number] PRIMARY CARE PROVIDER:    Verl Blalock, NP,  [address] [place] [address] [phone]  REFERRING PROVIDER:   Verl Blalock, NP [address] [place],   [address] [phone]  RESPONSIBLE PARTY:    Contact Information     Name Relation Home Work Mobile   [place] Relative [place], [place] Relative   [place] [phone]          I met face to face with patient  in [facility]. Palliative Care was asked to follow this patient by consultation request of  Verl Blalock, NP to address advance care planning and complex medical decision making. This is a follow up visit.                                   ASSESSMENT AND PLAN / RECOMMENDATIONS:   Advance Care Planning/Goals of Care: Goals include to maximize quality of life and symptom management. Patient/health care surrogate gave his/her permission to discuss. Our advance care planning conversation included a discussion about:     Exploration of personal, cultural or spiritual beliefs that might influence medical decisions   Identification of a healthcare agent Cousin [name] CODE STATUS: FULL  Symptom Management/Plan:   I met with patient in her nursing home room. She said she's been doing well that she is eating well and sleeps well. Today she states that her knees are bothering her, eg ongoing OA pain. This is ongoing issue and must be handled medically as she is not a surgical candidate.  She has a walker and is able to ambulate when needed. She states her appetite is good and staff do not endorse any new issues.  Her POA checks on her   regularly.  Follow up Palliative Care Visit: Palliative care will continue to follow for complex medical decision making, advance care planning, and clarification of goals. Return 8 weeks or prn.  This visit was coded based on medical decision making (MDM).  PPS: 40%  HOSPICE ELIGIBILITY/DIAGNOSIS: no  Chief Complaint: OA  HISTORY OF PRESENT ILLNESS:  Christy [name] is a 66 y.o. year old female  with OA, decreased mobility, cognitive impairment, DM .   History obtained from review of EMR, discussion with primary team, and interview with family, facility staff/caregiver and/or Christy [name].  I reviewed available labs, medications, imaging, studies and related documents from the EMR.  Records reviewed and summarized above.   ROS   General: NAD EYES: denies vision changes ENMT: denies dysphagia Cardiovascular: denies chest pain, denies DOE Pulmonary: denies cough, denies increased SOB Abdomen: endorses good appetite, denies constipation, endorses incontinence of bowel GU: denies dysuria, endorses incontinence of urine MSK:  denies increased weakness,  no falls reported Skin: denies rashes or wounds Neurological: endorses pain, denies insomnia Psych: Endorses positive mood Heme/lymph/immuno: denies bruises, abnormal bleeding  Physical Exam: Current and past weights: stable  Constitutional: NAD General: frail appearing, obese  EYES: anicteric sclera, lids intact, no discharge  ENMT: intact hearing, oral mucous membranes moist, dentition intact CV: no LE edema Pulmonary: no increased work of  breathing, no cough, room air Abdomen: intake 100%,  soft and non tender, no ascites GU: deferred MSK: mild  sarcopenia, moves all extremities, ambulatory with walker  Skin: warm and dry, no rashes or wounds on visible skin Neuro:  + generalized weakness,  + cognitive impairment Psych: non-anxious affect, A and O x 2-3 Hem/lymph/immuno: no widespread bruising   Thank you for the opportunity  to participate in the care of Christy [name]. Please call our office at [phone] if we can be of additional assistance.   Jason Coop, NP   COVID-19 PATIENT SCREENING TOOL Asked and negative response unless otherwise noted:   Have you had symptoms of covid, tested positive or been in contact with someone with symptoms/positive test in the past 5-10 days?

## 2021-12-14 ENCOUNTER — Other Ambulatory Visit: Payer: Medicare Other | Admitting: Primary Care

## 2022-01-05 ENCOUNTER — Non-Acute Institutional Stay: Payer: Medicare Other | Admitting: Nurse Practitioner

## 2022-01-05 DIAGNOSIS — Z515 Encounter for palliative care: Secondary | ICD-10-CM

## 2022-01-05 DIAGNOSIS — R0602 Shortness of breath: Secondary | ICD-10-CM

## 2022-01-05 DIAGNOSIS — I509 Heart failure, unspecified: Secondary | ICD-10-CM

## 2022-01-05 NOTE — Progress Notes (Signed)
[facility] Consult Note Telephone: [phone]  Fax: [phone]    Date of encounter: 01/05/22 4:44 PM PATIENT NAME: Christy [name] [place] [address]   [phone] (home)  DOB: 02/08/1955 MRN: [id number] PRIMARY CARE PROVIDER:   [facility] Verl Blalock, NP,  [address] [place] [address] [phone] RESPONSIBLE PARTY:    Contact Information     Name Relation Home Work Mobile   [place] Relative [place], [place] Relative   [place] [phone]       I met face to face with patient  in [facility]. Palliative Care was asked to follow this patient by consultation request of  Verl Blalock, NP to address advance care planning and complex medical decision making. This is a follow up visit.                                  ASSESSMENT AND PLAN / RECOMMENDATIONS:  Symptom Management/Plan: 1. Advance Care Planning;  Full code 2. Goals of Care: Goals include to maximize quality of life and symptom management. Our advance care planning conversation included a discussion about:    The value and importance of advance care planning  Exploration of personal, cultural or spiritual beliefs that might influence medical decisions  Exploration of goals of care in the event of a sudden injury or illness  Identification and preparation of a healthcare agent  Review and updating or creation of an advance directive document. 3. Palliative care encounter; Palliative care encounter; Palliative medicine team will continue to support patient, patient's family, and medical team. Visit consisted of counseling and education dealing with the complex and emotionally intense issues of symptom management and palliative care in the setting of serious and potentially life-threatening illness 4. Shortness of breath secondary to CHF, stable, continue to monitor  respiratory status, monitor edema, weights, medications reviewed, continue, OP Cardiology f/u visits.  Follow up Palliative Care Visit: Palliative care will continue to follow for complex medical decision making, advance care planning, and clarification of goals. Return 4 to 8 weeks or prn. PPS: 40%   HOSPICE ELIGIBILITY/DIAGNOSIS: no   Chief Complaint: Follow up palliative consult for complex medical decision making, address goals, manage ongoing symptoms   HISTORY OF PRESENT ILLNESS:  Christy [name] is a 66 y.o. year old female multiple medical problems including CHF, HTN, DM, Neuropathy, GERD, Obesity, CKD, HLD, tobacco use, schizophrenia. Christy [name] resides at [facility]. Christy [name] requires some assistance with mobility, bathing, dressing. Christy [name] does feed herself. Christy. [name] is interactive, at present she is sitting on the bed in her room. Christy. [name] was very interactive, engaging, we talked about ros, functional abilities. We talked about appetite, weights, edema, sob, pain. We talked about residing at facility. Christy. [name] limited with cognitive impairment. Support provided. Medications, medical goals and goc reviewed. No new changes today. Attempted to contact RP. Updated staff. Most PC visit supportive.   I spent 44 minutes providing this consultation. More than 50% of the time in this consultation was spent in counseling and care coordination.    History obtained from review of EMR, discussion with primary team, and interview with family, facility staff/caregiver and/or Christy. [name].  I reviewed available labs, medications, imaging, studies and related documents from the EMR.  Records reviewed and summarized above.  ROS 10 point system reviewed all negative except HPI   Physical Exam: Constitutional: NAD General: obese, pleasant female EYES: lids intact ENMT: oral mucous membranes moist CV: +BLE edema Pulmonary: no increased work of breathing, no cough, room air Abdomen: soft and non tender,  no ascites MSK: ambulatory with walker  Skin: warm and dry Neuro:  + generalized weakness,  + cognitive impairment Psych: non-anxious affect, A and Oriented Thank you for the opportunity to participate in the care of Christy [name]. Please call our office at [phone] if we can be of additional assistance.    Z , NP   

## 2022-02-25 ENCOUNTER — Non-Acute Institutional Stay: Payer: Medicare Other | Admitting: Nurse Practitioner

## 2022-02-25 ENCOUNTER — Encounter: Payer: Self-pay | Admitting: Nurse Practitioner

## 2022-02-25 DIAGNOSIS — I509 Heart failure, unspecified: Secondary | ICD-10-CM

## 2022-02-25 DIAGNOSIS — R0602 Shortness of breath: Secondary | ICD-10-CM

## 2022-02-25 DIAGNOSIS — U071 COVID-19: Secondary | ICD-10-CM

## 2022-02-25 DIAGNOSIS — Z515 Encounter for palliative care: Secondary | ICD-10-CM

## 2022-02-25 DIAGNOSIS — R051 Acute cough: Secondary | ICD-10-CM

## 2022-02-25 NOTE — Progress Notes (Addendum)
Designer, jewellery Palliative Care Consult Note Telephone: [phone]  Fax: [phone]    Date of encounter: 02/25/22 1:33 PM PATIENT NAME: Christy [name] [place] [address]   [phone] (home)  DOB: 12/02/1955 MRN: [id number] PRIMARY CARE PROVIDER:   [facility] Verl Blalock, NP,  [address] [place] [address] [phone]  RESPONSIBLE PARTY:    Contact Information     Name Relation Home Work Mobile   [place] Relative [place], [place] Relative   [place] [phone]       I met face to face with patient  in [facility]. Palliative Care was asked to follow this patient by consultation request of  Verl Blalock, NP to address advance care planning and complex medical decision making. This is a follow up visit.                                  ASSESSMENT AND PLAN / RECOMMENDATIONS:  Symptom Management/Plan: 1. Advance Care Planning;  Full code 2. Goals of Care: Goals include to maximize quality of life and symptom management. Our advance care planning conversation included a discussion about:    The value and importance of advance care planning  Exploration of personal, cultural or spiritual beliefs that might influence medical decisions  Exploration of goals of care in the event of a sudden injury or illness  Identification and preparation of a healthcare agent  Review and updating or creation of an advance directive document. 3. Palliative care encounter; Palliative care encounter; Palliative medicine team will continue to support patient, patient's family, and medical team. Visit consisted of counseling and education dealing with the complex and emotionally intense issues of symptom management and palliative care in the setting of serious and potentially life-threatening illness 4. Shortness of breath secondary to CHF, stable, continue to  monitor respiratory status, monitor edema, weights, medications reviewed, continue, OP Cardiology f/u visits. Currently has covid, symptoms managed, comfortable Follow up Palliative Care Visit: Palliative care will continue to follow for complex medical decision making, advance care planning, and clarification of goals. Return 4 to 8 weeks or prn. PPS: 40% Chief Complaint: Follow up palliative consult for complex medical decision making, address goals, manage ongoing symptoms   HISTORY OF PRESENT ILLNESS:  Christy [name] is a 67 y.o. year old female multiple medical problems including CHF, HTN, DM, Neuropathy, GERD, Obesity, CKD, HLD, tobacco use, schizophrenia. Ms Mcginniss resides at [facility]. Ms Loughmiller requires some assistance with mobility, bathing, dressing. Ms Owusu does feed herself. Ms. Pickert is interactive, at present she is sitting in the chair in her room. Ms Casella endorses she is feeling okay, currently has covid. We talked about symptoms, ros, chronic disease progression, importance of functional mobility. We talked about fall risk. We talked about appetite, importance of nutrition. We talked about residing at facility. Medications, goc, poc reviewed. We talked about Ms Lozada's support system. PC f/u visit further discussion monitor trends of appetite, weights, monitor for functional, cognitive decline with chronic disease progression, assess any active symptoms, supportive role.  I spent 42 minutes providing this consultation. More than 50% of the time in this consultation was spent in counseling and care coordination.    History obtained from review of EMR, discussion with primary team, and interview with family, facility staff/caregiver and/or Ms. Marchena.  I reviewed available labs, medications, imaging, studies and related documents from the EMR.  Records reviewed and summarized above.   Physical Exam: General: obese, pleasant female EYES: lids intact ENMT: oral mucous membranes moist CV:  +BLE edema Pulmonary: no increased work of breathing, no cough, room air Abdomen: soft and non tender, no ascites MSK: ambulatory with walker  Skin: warm and dry Neuro:  + generalized weakness,  + cognitive impairment Psych: non-anxious affect, A and Oriented Thank you for the opportunity to participate in the care of Ms. Taber. Please call our office at [phone] if we can be of additional assistance.   Lerin Jech Ihor Gully, NP

## 2022-05-04 ENCOUNTER — Non-Acute Institutional Stay: Payer: Medicare Other | Admitting: Nurse Practitioner

## 2022-05-04 DIAGNOSIS — R0602 Shortness of breath: Secondary | ICD-10-CM

## 2022-05-04 DIAGNOSIS — I509 Heart failure, unspecified: Secondary | ICD-10-CM

## 2022-05-04 DIAGNOSIS — Z515 Encounter for palliative care: Secondary | ICD-10-CM

## 2022-05-04 NOTE — Progress Notes (Signed)
Therapist, nutritional Palliative Care Consult Note Telephone: [phone]  Fax: [phone]    Date of encounter: 05/04/22 2:37 PM PATIENT NAME: Christy [name] [address] [place] [address]   [phone] (home)  DOB: 02-28-1955 MRN: [id number] PRIMARY CARE PROVIDER:   [name] Christy Lambert, NP,  [address] [place] [address] [phone]  RESPONSIBLE PARTY:    Contact Information     Name Relation Home Work Mobile   [place] Relative [phone]     [name] Relative   [phone]   Suszanne Conners [phone]       I met face to face with patient  in [facility]. Palliative Care was asked to follow this patient by consultation request of  Christy Lambert, NP to address advance care planning and complex medical decision making. This is a follow up visit.                                  ASSESSMENT AND PLAN / RECOMMENDATIONS:  Symptom Management/Plan: 1. Advance Care Planning;  Full code 2. Palliative care encounter; Palliative care encounter; Palliative medicine team will continue to support patient, patient's family, and medical team. Visit consisted of counseling and education dealing with the complex and emotionally intense issues of symptom management and palliative care in the setting of serious and potentially life-threatening illness 3. Shortness of breath secondary to CHF, stable, continue to monitor respiratory status, monitor edema, weights, medications reviewed, continue, OP Cardiology f/u visits.   04/30/2022 weight 259 lbs Follow up Palliative Care Visit: Palliative care will continue to follow for complex medical decision making, advance care planning, and clarification of goals. Return 4 to 8 weeks or prn. PPS: 50% Chief Complaint: Follow up palliative consult for complex medical decision making, address goals, manage ongoing symptoms   HISTORY OF PRESENT ILLNESS:  Christy [name] is a 67 y.o. year old female multiple medical problems including CHF, HTN, DM, Neuropathy, GERD, Obesity, CKD, HLD, tobacco use, schizophrenia. Christy [name] resides at [facility]. Christy [name] requires some assistance with mobility, bathing, dressing. Christy [name] does feed herself with good appetite. No noted recent falls, wounds, infections, hospitalizations. I visited and observed Christy [name], sitting at the table in the dining room finishing her meal. Purpose of today PC f/u visit further discussion monitor trends of appetite, weights, monitor for functional, cognitive decline with chronic disease progression, assess any active symptoms, supportive role. We talked about how she has been feeling, ros, functional ability, feeling better since last PC visit with COVID. We talked about daily routine, quality of life, what brings her joy, things she likes to do. We talked about importance of socialization, activities, mobility. We talked about appetite, foods she likes, nutritional education done. Reviewed weights. Most PC visit supportive. Therapeutic listening, emotional support provided. Medications, goc, poc updated. PC f/u visit further discussion monitor trends of appetite, weights, monitor for functional, cognitive decline with chronic disease progression, assess any active symptoms, supportive role. Questions answered with staff. Lengthily visit due to Christy [name]'s education, supportive care.   I spent 61 minutes providing this consultation. More than 50% of the time in this consultation was spent in counseling and care coordination.    History obtained from review of EMR, discussion with primary team, and interview with family, facility staff/caregiver and/or Christy. [name].  I reviewed available labs, medications, imaging, studies and  related documents from the EMR.  Records reviewed and summarized above.    Physical Exam: General: obese, pleasant female ENMT: oral mucous membranes moist CV: HSR; +BLE  edema Pulmonary: Breath sounds clear MSK: ambulatory with walker  Neuro:  + cognitive impairment Psych: non-anxious affect, oriented Thank you for the opportunity to participate in the care of Christy [name]. Please call our office at [phone] if we can be of additional assistance.   Christy [name] Christy Rome, NP

## 2022-05-29 ENCOUNTER — Emergency Department: Payer: Medicare Other

## 2022-05-29 ENCOUNTER — Other Ambulatory Visit: Payer: Self-pay

## 2022-05-29 ENCOUNTER — Emergency Department
Admission: EM | Admit: 2022-05-29 | Discharge: 2022-05-29 | Disposition: A | Discharge status: Skilled Nursing Facility | Payer: Medicare Other | Attending: Emergency Medicine | Admitting: Emergency Medicine

## 2022-05-29 DIAGNOSIS — E119 Type 2 diabetes mellitus without complications: Secondary | ICD-10-CM | POA: Insufficient documentation

## 2022-05-29 DIAGNOSIS — R112 Nausea with vomiting, unspecified: Secondary | ICD-10-CM | POA: Insufficient documentation

## 2022-05-29 DIAGNOSIS — I10 Essential (primary) hypertension: Secondary | ICD-10-CM | POA: Insufficient documentation

## 2022-05-29 DIAGNOSIS — R140 Abdominal distension (gaseous): Secondary | ICD-10-CM | POA: Insufficient documentation

## 2022-05-29 DIAGNOSIS — R197 Diarrhea, unspecified: Secondary | ICD-10-CM | POA: Insufficient documentation

## 2022-05-29 DIAGNOSIS — R1084 Generalized abdominal pain: Secondary | ICD-10-CM | POA: Insufficient documentation

## 2022-05-29 LAB — CBC
HCT: 40.2 % (ref 36.0–46.0)
Hemoglobin: 12.1 g/dL (ref 12.0–15.0)
MCH: 22.3 pg — ABNORMAL LOW (ref 26.0–34.0)
MCHC: 30.1 g/dL (ref 30.0–36.0)
MCV: 74 fL — ABNORMAL LOW (ref 80.0–100.0)
Platelets: 275 10*3/uL (ref 150–400)
RBC: 5.43 MIL/uL — ABNORMAL HIGH (ref 3.87–5.11)
RDW: 15.9 % — ABNORMAL HIGH (ref 11.5–15.5)
WBC: 11.3 10*3/uL — ABNORMAL HIGH (ref 4.0–10.5)
nRBC: 0 % (ref 0.0–0.2)

## 2022-05-29 LAB — COMPREHENSIVE METABOLIC PANEL
ALT: 14 U/L (ref 0–44)
AST: 16 U/L (ref 15–41)
Albumin: 4.4 g/dL (ref 3.5–5.0)
Alkaline Phosphatase: 70 U/L (ref 38–126)
Anion gap: 10 (ref 5–15)
BUN: 15 mg/dL (ref 8–23)
CO2: 28 mmol/L (ref 22–32)
Calcium: 9.5 mg/dL (ref 8.9–10.3)
Chloride: 100 mmol/L (ref 98–111)
Creatinine, Ser: 0.82 mg/dL (ref 0.44–1.00)
GFR, Estimated: >60 mL/min (ref >60)
Glucose, Bld: 112 mg/dL — ABNORMAL HIGH (ref 70–99)
Potassium: 3.7 mmol/L (ref 3.5–5.1)
Sodium: 138 mmol/L (ref 135–145)
Total Bilirubin: 0.9 mg/dL (ref 0.3–1.2)
Total Protein: 7.3 g/dL (ref 6.5–8.1)

## 2022-05-29 LAB — URINALYSIS, ROUTINE W REFLEX MICROSCOPIC
Bilirubin Urine: NEGATIVE
Glucose, UA: NEGATIVE mg/dL
Hgb urine dipstick: NEGATIVE
Ketones, ur: NEGATIVE mg/dL
Nitrite: NEGATIVE
Protein, ur: NEGATIVE mg/dL
Specific Gravity, Urine: 1.027 (ref 1.005–1.030)
pH: 6 (ref 5.0–8.0)

## 2022-05-29 LAB — LIPASE, BLOOD: Lipase: 22 U/L (ref 11–51)

## 2022-05-29 MED ORDER — ONDANSETRON 4 MG PO TBDP: 4.0000 mg | ORAL_TABLET | Freq: Three times a day (TID) | ORAL | 0 refills | Status: DC | PRN

## 2022-05-29 MED ORDER — ONDANSETRON HCL 4 MG/2ML IJ SOLN: 4.0000 mg | Freq: Once | INTRAMUSCULAR | Status: DC

## 2022-05-29 MED ORDER — IOHEXOL 300 MG/ML  SOLN
100.0000 mL | Freq: Once | INTRAMUSCULAR | Status: AC | PRN
  Administered 2022-05-29: 100 mL via INTRAVENOUS

## 2022-05-29 NOTE — ED Triage Notes (Signed)
Pt to ED for generalized abdominal pain since last night after eating. Had NVD last night but not today. AEMS from [facility]. States has been passing gas. Walks with walker.

## 2022-05-29 NOTE — Discharge Instructions (Signed)
Ms. Sharian has a normal exam and reassuring labs to her emergency department evaluation.  Her CT scan was negative did not show any acute infection, inflammation, or colitis.  Her symptoms may represent a viral gastroenteritis or bad food exposure.  She is stable at this time tolerating liquids without subsequent nausea and vomiting.  She should advance her diet as tolerated.  She will be discharged with a prescription for nausea medicine to take as needed.

## 2022-05-29 NOTE — ED Triage Notes (Signed)
First nurse note: Arrived by EMS from [facility] c/o abdominal pain with N/V/D since last night.  Facility reported she is at her normal baseline. Walks with Rolator and able to do so with EMS A&O x4  History parkinson's with tremors  EMS vitals: 114/67 b/p 90p 97% Ra 117CBG 98.7oral

## 2022-05-29 NOTE — ED Provider Notes (Signed)
[facility] Emergency Department Provider Note     Event Date/Time   First MD Initiated Contact with Patient 05/29/22 1419     (approximate)   History   Abdominal Pain   HPI  Christy [name] is a 67 y.o. female with a history of obesity, HTN, HLD, mental status alteration, schizophrenia, and diabetes, presents to the ED facility.  Patient reports onset of generalized abdominal pain last night after eating.  She reports episodes of nausea, vomiting, watery diarrhea last night.  Since that time she has been passing gas she reports and reports abdominal bloating at this time.  She had all issues with morning with no subsequent emesis but no other p.o. intake.  She continues to endorse some nausea without vomiting.  Patient is afebrile on presentation to the ED with normal vital signs reported by EMS.  Physical Exam   Triage Vital Signs: ED Triage Vitals  Enc Vitals Group     BP 05/29/22 1246 104/65     Pulse Rate 05/29/22 1246 89     Resp 05/29/22 1246 18     Temp 05/29/22 1246 98.2 F (36.8 C)     Temp Source 05/29/22 1246 Oral     SpO2 05/29/22 1246 96 %     Weight 05/29/22 1243 275 lb (124.7 kg)     Height 05/29/22 1243 5\' 7"  (1.702 m)     Head Circumference --      Peak Flow --      Pain Score 05/29/22 1242 10     Pain Loc --      Pain Edu? --      Excl. in GC? --     Most recent vital signs: Vitals:   05/29/22 1246  BP: 104/65  Pulse: 89  Resp: 18  Temp: 98.2 F (36.8 C)  SpO2: 96%    General Awake, no distress. NAD HEENT NCAT. PERRL. EOMI. No rhinorrhea. Mucous membranes are moist.  CV:  Good peripheral perfusion. RRR RESP:  Mildly distended. Decreased bowel sounds. No rebound, guarding, or ridigity.   ED Results / Procedures / Treatments   Labs (all labs ordered are listed, but only abnormal results are displayed) Labs Reviewed  COMPREHENSIVE METABOLIC PANEL - Abnormal; Notable for the following components:      Result Value    Glucose, Bld 112 (*)    All other components within normal limits  CBC - Abnormal; Notable for the following components:   WBC 11.3 (*)    RBC 5.43 (*)    MCV 74.0 (*)    MCH 22.3 (*)    RDW 15.9 (*)    All other components within normal limits  URINALYSIS, ROUTINE W REFLEX MICROSCOPIC - Abnormal; Notable for the following components:   Color, Urine YELLOW (*)    APPearance HAZY (*)    Leukocytes,Ua SMALL (*)    Bacteria, UA RARE (*)    All other components within normal limits  LIPASE, BLOOD     EKG  RADIOLOGY  I personally viewed and evaluated these images as part of my medical decision making, as well as reviewing the written report by the radiologist.  ED Provider Interpretation: No acute findings  CT ABDOMEN PELVIS W CONTRAST  Result Date: 05/29/2022 CLINICAL DATA:  Abdominal pain, acute, nonlocalized. Generalized abdominal pain since last night. EXAM: CT ABDOMEN AND PELVIS WITH CONTRAST TECHNIQUE: Multidetector CT imaging of the abdomen and pelvis was performed using the standard protocol following bolus administration  of intravenous contrast. RADIATION DOSE REDUCTION: This exam was performed according to the departmental dose-optimization program which includes automated exposure control, adjustment of the mA and/or kV according to patient size and/or use of iterative reconstruction technique. CONTRAST:  OMNIPAQUE IOHEXOL 300 MG/ML  SOLN COMPARISON:  CT abdomen/pelvis 08/06/2016. FINDINGS: Lower chest: No acute abnormality. Hepatobiliary: No focal liver abnormality is seen. Status post cholecystectomy. No biliary dilatation. Pancreas: Unremarkable. No pancreatic ductal dilatation or surrounding inflammatory changes. Spleen: Normal. Adrenals/Urinary Tract: Adrenal glands are unremarkable. Kidneys are normal, without renal calculi, focal lesion, or hydronephrosis. Distended bladder. Stomach/Bowel: Normal stomach and duodenum. No dilated loops of small bowel. Appendix is not  visualized. No bowel wall thickening or surrounding inflammation. Vascular/Lymphatic: Aortic atherosclerosis. No enlarged abdominal or pelvic lymph nodes. Reproductive: Status post hysterectomy. No adnexal masses. Other: No abdominal wall hernia or abnormality. No abdominopelvic ascites. Musculoskeletal: No acute or significant osseous findings. IMPRESSION: 1. No acute findings in the abdomen or pelvis. 2. Aortic Atherosclerosis (ICD10-I70.0). Electronically Signed   By: Orvan Falconer M.D.   On: 05/29/2022 17:28     PROCEDURES:  Critical Care performed: No  Procedures   MEDICATIONS ORDERED IN ED: Medications  ondansetron (ZOFRAN) injection 4 mg (has no administration in time range)  iohexol (OMNIPAQUE) 300 MG/ML solution 100 mL (100 mLs Intravenous Contrast Given 05/29/22 1702)     IMPRESSION / MDM / ASSESSMENT AND PLAN / ED COURSE  I reviewed the triage vital signs and the nursing notes.                              Differential diagnosis includes, but is not limited to, acute appendicitis, diverticulitis, urinary tract infection/pyelonephritis, bowel obstruction, colitis, renal colic, gastroenteritis, hernia, etc.  Patient's presentation is most consistent with acute complicated illness / injury requiring diagnostic workup.  Patient's diagnosis is consistent with NVD, with likely infectious etiology. Her exam and work-up are normal and reassuring. Her CT abd was normal, without evidence of colitis, inflammation, or explanation of her symptoms. She has had a stable course in the ED without subsequent nausea and vomiting.  Patient tolerated p.o. without complaints of abdominal pain or ensuing vomiting.  Stable at this time for discharge home.  Patient will be discharged home with prescriptions for Zofran. Patient is to follow up with her primary provider as needed or otherwise directed. Patient is given ED precautions to return to the ED for any worsening or new symptoms.  FINAL CLINICAL  IMPRESSION(S) / ED DIAGNOSES   Final diagnoses:  Generalized abdominal pain  Nausea vomiting and diarrhea     Rx / DC Orders   ED Discharge Orders          Ordered    ondansetron (ZOFRAN-ODT) 4 MG disintegrating tablet  Every 8 hours PRN        05/29/22 1758             Note:  This document was prepared using Dragon voice recognition software and may include unintentional dictation errors.    Lissa Hoard, PA-C 05/29/22 1825    Concha Se, MD 05/30/22 [phone]

## 2022-06-28 ENCOUNTER — Non-Acute Institutional Stay: Payer: Medicare Other | Admitting: Nurse Practitioner

## 2022-06-28 DIAGNOSIS — R0602 Shortness of breath: Secondary | ICD-10-CM

## 2022-06-28 DIAGNOSIS — Z515 Encounter for palliative care: Secondary | ICD-10-CM

## 2022-06-28 DIAGNOSIS — I509 Heart failure, unspecified: Secondary | ICD-10-CM

## 2022-06-28 NOTE — Progress Notes (Signed)
Therapist, nutritional Palliative Care Consult Note Telephone: [phone]  Fax: [phone]    Date of encounter: 06/28/22 5:22 PM PATIENT NAME: Christy [name] [address] [place] [address]   [phone] (home)  DOB: 1956/01/03 MRN: [id number] PRIMARY CARE PROVIDER:    Ellan Lambert, NP,  [address] [place] [address] [phone]  RESPONSIBLE PARTY:    Contact Information     Name Relation Home Work Mobile   Christy [name] Relative [phone]     Christy [name] Relative   [phone]   Christy [name] [phone]       I met face to face with patient  in [facility]. Palliative Care was asked to follow this patient by consultation request of  Christy Lambert, NP to address advance care planning and complex medical decision making. This is a follow up visit.                                  ASSESSMENT AND PLAN / RECOMMENDATIONS:  Symptom Management/Plan: 1. Advance Care Planning;  Full code 2. Palliative care encounter; Palliative care encounter; Palliative medicine team will continue to support patient, patient's family, and medical team. Visit consisted of counseling and education dealing with the complex and emotionally intense issues of symptom management and palliative care in the setting of serious and potentially life-threatening illness 3. Shortness of breath secondary to CHF, stable, continue to monitor respiratory status, monitor edema, weights, medications reviewed, continue, OP Cardiology f/u visits.    04/30/2022 weight 259 lbs 06/23/2022 weight 270 lbs Follow up Palliative Care Visit: Palliative care will continue to follow for complex medical decision making, advance care planning, and clarification of goals. Return 2 to 8 weeks or prn. PPS: 50% Chief Complaint: Follow up palliative consult for complex medical decision making, address goals, manage ongoing symptoms   HISTORY OF PRESENT ILLNESS:   Christy [name] is a 67 y.o. year old female multiple medical problems including CHF, HTN, DM, Neuropathy, GERD, Obesity, CKD, HLD, tobacco use, schizophrenia. Ms Christy [name] resides at [facility]. Ms Christy [name] requires some assistance with mobility, bathing, dressing. Ms Christy [name] does feed herself with good appetite. No noted recent falls, wounds, infections, hospitalizations. I visited and observed Ms Christy [name], sitting at the table in the dining room finishing her meal. Purpose of today PC f/u visit further discussion monitor trends of appetite, weights, monitor for functional, cognitive decline with chronic disease progression, assess any active symptoms, supportive role. We talked about how she has been feeling, ros, functional ability. We talked about ros, her daily routine. Ms Christy [name] was engaging though no extensive discussion with cognitive impairment. Ms Christy [name] was cooperative with assessment, verbalized appreciated pc visit. Supportive visit. Ms Christy [name] remains currently stable, asymptomatic. Therapeutic listening, emotional support provided. Medications, goc, poc updated. PC f/u visit further discussion monitor trends of appetite, weights, monitor for functional, cognitive decline with chronic disease progression, assess any active symptoms, supportive role. Questions answered with staff. Lengthily visit due to Ms Christy [name]'s education, supportive care.   I spent 41 minutes providing this consultation. More than 50% of the time in this consultation was spent in counseling and care coordination.    History obtained from review of EMR, discussion with primary team, and interview with family, facility staff/caregiver and/or [name].  I reviewed available labs, medications, imaging, studies and related documents from the EMR.  Records reviewed and  summarized above.    Physical Exam: General: obese, pleasant female ENMT: oral mucous membranes moist CV: HSR; +BLE edema Pulmonary: Breath sounds clear MSK: ambulatory with walker   Neuro:  + cognitive impairment Psych: non-anxious affect, oriented Thank you for the opportunity to participate in the care of Christy [name]. Please call our office at [phone] if we can be of additional assistance.   Derian Dimalanta Prince Rome, NP

## 2022-07-20 ENCOUNTER — Emergency Department
Admission: EM | Admit: 2022-07-20 | Discharge: 2022-07-20 | Disposition: A | Discharge status: Home / Self Care | Payer: Medicare Other | Attending: Emergency Medicine | Admitting: Emergency Medicine

## 2022-07-20 ENCOUNTER — Emergency Department: Payer: Medicare Other

## 2022-07-20 ENCOUNTER — Other Ambulatory Visit: Payer: Self-pay

## 2022-07-20 ENCOUNTER — Encounter: Payer: Self-pay | Admitting: Emergency Medicine

## 2022-07-20 DIAGNOSIS — R600 Localized edema: Secondary | ICD-10-CM | POA: Diagnosis not present

## 2022-07-20 DIAGNOSIS — E119 Type 2 diabetes mellitus without complications: Secondary | ICD-10-CM | POA: Insufficient documentation

## 2022-07-20 DIAGNOSIS — I509 Heart failure, unspecified: Secondary | ICD-10-CM | POA: Insufficient documentation

## 2022-07-20 DIAGNOSIS — I110 Hypertensive heart disease with heart failure: Secondary | ICD-10-CM | POA: Insufficient documentation

## 2022-07-20 DIAGNOSIS — R0789 Other chest pain: Secondary | ICD-10-CM | POA: Diagnosis present

## 2022-07-20 LAB — CBC
HCT: 39.8 % (ref 36.0–46.0)
Hemoglobin: 11.9 g/dL — ABNORMAL LOW (ref 12.0–15.0)
MCH: 22.2 pg — ABNORMAL LOW (ref 26.0–34.0)
MCHC: 29.9 g/dL — ABNORMAL LOW (ref 30.0–36.0)
MCV: 74.3 fL — ABNORMAL LOW (ref 80.0–100.0)
Platelets: 253 10*3/uL (ref 150–400)
RBC: 5.36 MIL/uL — ABNORMAL HIGH (ref 3.87–5.11)
RDW: 15.7 % — ABNORMAL HIGH (ref 11.5–15.5)
WBC: 7.7 10*3/uL (ref 4.0–10.5)
nRBC: 0 % (ref 0.0–0.2)

## 2022-07-20 LAB — BASIC METABOLIC PANEL
Anion gap: 9 (ref 5–15)
BUN: 9 mg/dL (ref 8–23)
CO2: 26 mmol/L (ref 22–32)
Calcium: 9.4 mg/dL (ref 8.9–10.3)
Chloride: 102 mmol/L (ref 98–111)
Creatinine, Ser: 0.69 mg/dL (ref 0.44–1.00)
GFR, Estimated: >60 mL/min (ref >60)
Glucose, Bld: 104 mg/dL — ABNORMAL HIGH (ref 70–99)
Potassium: 3.6 mmol/L (ref 3.5–5.1)
Sodium: 137 mmol/L (ref 135–145)

## 2022-07-20 LAB — TROPONIN I (HIGH SENSITIVITY)
Troponin I (High Sensitivity): 4 ng/L (ref <18)
Troponin I (High Sensitivity): 4 ng/L (ref <18)

## 2022-07-20 MED ORDER — IBUPROFEN 600 MG PO TABS
600.0000 mg | ORAL_TABLET | Freq: Once | ORAL | Status: AC
  Administered 2022-07-20: 600 mg via ORAL
  Filled 2022-07-20: qty 1

## 2022-07-20 NOTE — ED Provider Notes (Signed)
[facility] Provider Note    Event Date/Time   First MD Initiated Contact with Patient 07/20/22 1320     (approximate)   History   Chest Pain   HPI  Christy [name] is a 67 y.o. female with a history of CHF, hypertension, hyperlipidemia, GERD, diabetes, and schizophrenia who presents with chest pain, acute onset this morning, substernal in location, sharp in quality and nonradiating.  It is not changed with positions or movements but is exacerbated by pressing on the chest.  She denies any associated shortness of breath, lightheadedness, nausea or vomiting, cough, or fever.  She states that she has had this pain a few times before.  She denies any acute leg pain or swelling.  I reviewed the past medical records.  The patient's most recent outpatient encounter was with a [facility] on 6/3 for follow-up of her chronic conditions.   Physical Exam   Triage Vital Signs: ED Triage Vitals  Enc Vitals Group     BP 07/20/22 1136 (!) 181/100     Pulse Rate 07/20/22 1136 69     Resp 07/20/22 1136 18     Temp 07/20/22 1136 98.1 F (36.7 C)     Temp src --      SpO2 07/20/22 1136 92 %     Weight --      Height --      Head Circumference --      Peak Flow --      Pain Score 07/20/22 1134 9     Pain Loc --      Pain Edu? --      Excl. in GC? --     Most recent vital signs: Vitals:   07/20/22 1327 07/20/22 1605  BP: (!) 145/78   Pulse:  67  Resp:    Temp:    SpO2:       General: Alert, comfortable appearing, no distress.   CV:  Good peripheral perfusion.  Normal heart sounds. Resp:  Normal effort.  Lungs CTAB. Abd:  No distention.  Other:  1+ bilateral lower extremity edema.  No calf or popliteal tenderness.  Reproducible chest wall tenderness.   ED Results / Procedures / Treatments   Labs (all labs ordered are listed, but only abnormal results are displayed) Labs Reviewed  BASIC METABOLIC PANEL - Abnormal; Notable for the following  components:      Result Value   Glucose, Bld 104 (*)    All other components within normal limits  CBC - Abnormal; Notable for the following components:   RBC 5.36 (*)    Hemoglobin 11.9 (*)    MCV 74.3 (*)    MCH 22.2 (*)    MCHC 29.9 (*)    RDW 15.7 (*)    All other components within normal limits  TROPONIN I (HIGH SENSITIVITY)  TROPONIN I (HIGH SENSITIVITY)     EKG  ED ECG REPORT I, Dionne Bucy, the attending physician, personally viewed and interpreted this ECG.  Date: 07/20/2022 EKG Time: 1135 Rate: 73 Rhythm: normal sinus rhythm QRS Axis: normal Intervals: Incomplete RBBB ST/T Wave abnormalities: Nonspecific T wave abnormalities Narrative Interpretation: no evidence of acute ischemia; no significant change when compared to EKG of 07/09/2018    RADIOLOGY  Chest x-ray: I independently viewed and interpreted the images; there is no focal consolidation or edema  PROCEDURES:  Critical Care performed: No  Procedures   MEDICATIONS ORDERED IN ED: Medications  ibuprofen (ADVIL) tablet  600 mg (600 mg Oral Given 07/20/22 1345)     IMPRESSION / MDM / ASSESSMENT AND PLAN / ED COURSE  I reviewed the triage vital signs and the nursing notes.  67 year old female with PMH as noted above presents with atypical chest pain since this morning.  On exam her vital signs are normal and she is well-appearing.  The pain is reproducible with palpation of the chest wall.  Differential diagnosis includes, but is not limited to, musculoskeletal chest wall pain, GERD, neuropathic pain, other benign etiology, less likely ACS.  I have a low suspicion for PE given the lack of DVT symptoms and the lack of tachycardia or hypoxia.  There is no clinical evidence for aortic dissection or other vascular cause.  We will obtain 2 troponins, give a dose of ibuprofen for symptomatic treatment, and reassess.  Patient's presentation is most consistent with acute complicated illness / injury  requiring diagnostic workup.  The patient is on the cardiac monitor to evaluate for evidence of arrhythmia and/or significant heart rate changes.   ----------------------------------------- 7:34 PM on 07/20/2022 -----------------------------------------  Initial and repeat troponin were both negative.  The BMP and CBC showed no concerning acute findings.  The patient's pain improved with ibuprofen.  I counseled her on the results of the workup.  Overall presentation is still consistent with musculoskeletal etiology.  There is no indication for inpatient admission.  I gave the patient strict return precautions and she expressed understanding.  She will follow-up with her regular doctor and take Tylenol as needed at home.  FINAL CLINICAL IMPRESSION(S) / ED DIAGNOSES   Final diagnoses:  Musculoskeletal chest pain     Rx / DC Orders   ED Discharge Orders     None        Note:  This document was prepared using Dragon voice recognition software and may include unintentional dictation errors.    Dionne Bucy, MD 07/20/22 [phone]

## 2022-07-20 NOTE — ED Triage Notes (Signed)
Patient to ED via [facility] from [facility] for Chest pain. Patient states pain in center of chest and describes it as sharp. [facility] cardiac history.

## 2022-07-20 NOTE — Discharge Instructions (Signed)
You may take over-the-counter Tylenol as needed for the pain.  Return to the ER for new, worsening, or persistent severe chest pain, difficulty breathing, weakness or lightheadedness, or any other new or worsening symptoms that concern you.

## 2022-12-16 ENCOUNTER — Emergency Department: Payer: Medicare Other

## 2022-12-16 ENCOUNTER — Inpatient Hospital Stay
Admission: EM | Admit: 2022-12-16 | Discharge: 2022-12-20 | DRG: 871 | Disposition: A | Discharge status: Home Health | Payer: Medicare Other | Source: Skilled Nursing Facility | Attending: Internal Medicine | Admitting: Internal Medicine

## 2022-12-16 DIAGNOSIS — E039 Hypothyroidism, unspecified: Secondary | ICD-10-CM | POA: Diagnosis present

## 2022-12-16 DIAGNOSIS — Z1152 Encounter for screening for COVID-19: Secondary | ICD-10-CM | POA: Diagnosis not present

## 2022-12-16 DIAGNOSIS — R531 Weakness: Secondary | ICD-10-CM

## 2022-12-16 DIAGNOSIS — D649 Anemia, unspecified: Secondary | ICD-10-CM | POA: Diagnosis present

## 2022-12-16 DIAGNOSIS — R4702 Dysphasia: Secondary | ICD-10-CM | POA: Diagnosis present

## 2022-12-16 DIAGNOSIS — R319 Hematuria, unspecified: Secondary | ICD-10-CM | POA: Diagnosis present

## 2022-12-16 DIAGNOSIS — Z720 Tobacco use: Secondary | ICD-10-CM | POA: Diagnosis present

## 2022-12-16 DIAGNOSIS — Z7984 Long term (current) use of oral hypoglycemic drugs: Secondary | ICD-10-CM

## 2022-12-16 DIAGNOSIS — R410 Disorientation, unspecified: Secondary | ICD-10-CM

## 2022-12-16 DIAGNOSIS — D72829 Elevated white blood cell count, unspecified: Secondary | ICD-10-CM | POA: Diagnosis present

## 2022-12-16 DIAGNOSIS — E876 Hypokalemia: Secondary | ICD-10-CM | POA: Diagnosis present

## 2022-12-16 DIAGNOSIS — R1310 Dysphagia, unspecified: Secondary | ICD-10-CM

## 2022-12-16 DIAGNOSIS — E872 Acidosis, unspecified: Secondary | ICD-10-CM | POA: Diagnosis present

## 2022-12-16 DIAGNOSIS — N179 Acute kidney failure, unspecified: Secondary | ICD-10-CM | POA: Diagnosis present

## 2022-12-16 DIAGNOSIS — H409 Unspecified glaucoma: Secondary | ICD-10-CM | POA: Diagnosis present

## 2022-12-16 DIAGNOSIS — E785 Hyperlipidemia, unspecified: Secondary | ICD-10-CM | POA: Diagnosis present

## 2022-12-16 DIAGNOSIS — N390 Urinary tract infection, site not specified: Secondary | ICD-10-CM | POA: Diagnosis present

## 2022-12-16 DIAGNOSIS — Z716 Tobacco abuse counseling: Secondary | ICD-10-CM

## 2022-12-16 DIAGNOSIS — Z833 Family history of diabetes mellitus: Secondary | ICD-10-CM

## 2022-12-16 DIAGNOSIS — E119 Type 2 diabetes mellitus without complications: Secondary | ICD-10-CM | POA: Diagnosis present

## 2022-12-16 DIAGNOSIS — Z90710 Acquired absence of both cervix and uterus: Secondary | ICD-10-CM

## 2022-12-16 DIAGNOSIS — Z9049 Acquired absence of other specified parts of digestive tract: Secondary | ICD-10-CM | POA: Diagnosis not present

## 2022-12-16 DIAGNOSIS — R4182 Altered mental status, unspecified: Secondary | ICD-10-CM | POA: Diagnosis present

## 2022-12-16 DIAGNOSIS — Z888 Allergy status to other drugs, medicaments and biological substances status: Secondary | ICD-10-CM

## 2022-12-16 DIAGNOSIS — B9620 Unspecified Escherichia coli [E. coli] as the cause of diseases classified elsewhere: Secondary | ICD-10-CM | POA: Diagnosis not present

## 2022-12-16 DIAGNOSIS — R6520 Severe sepsis without septic shock: Secondary | ICD-10-CM | POA: Diagnosis present

## 2022-12-16 DIAGNOSIS — I10 Essential (primary) hypertension: Secondary | ICD-10-CM | POA: Diagnosis present

## 2022-12-16 DIAGNOSIS — Z6841 Body Mass Index (BMI) 40.0 and over, adult: Secondary | ICD-10-CM

## 2022-12-16 DIAGNOSIS — R7881 Bacteremia: Secondary | ICD-10-CM | POA: Diagnosis present

## 2022-12-16 DIAGNOSIS — A419 Sepsis, unspecified organism: Secondary | ICD-10-CM | POA: Diagnosis present

## 2022-12-16 DIAGNOSIS — F209 Schizophrenia, unspecified: Secondary | ICD-10-CM | POA: Diagnosis present

## 2022-12-16 DIAGNOSIS — Z91013 Allergy to seafood: Secondary | ICD-10-CM

## 2022-12-16 DIAGNOSIS — J45909 Unspecified asthma, uncomplicated: Secondary | ICD-10-CM | POA: Diagnosis present

## 2022-12-16 DIAGNOSIS — G4733 Obstructive sleep apnea (adult) (pediatric): Secondary | ICD-10-CM | POA: Diagnosis present

## 2022-12-16 DIAGNOSIS — M170 Bilateral primary osteoarthritis of knee: Secondary | ICD-10-CM | POA: Diagnosis present

## 2022-12-16 DIAGNOSIS — Z881 Allergy status to other antibiotic agents status: Secondary | ICD-10-CM

## 2022-12-16 DIAGNOSIS — G9341 Metabolic encephalopathy: Principal | ICD-10-CM | POA: Diagnosis present

## 2022-12-16 DIAGNOSIS — A4151 Sepsis due to Escherichia coli [E. coli]: Principal | ICD-10-CM | POA: Diagnosis present

## 2022-12-16 DIAGNOSIS — E538 Deficiency of other specified B group vitamins: Secondary | ICD-10-CM | POA: Diagnosis present

## 2022-12-16 DIAGNOSIS — F17210 Nicotine dependence, cigarettes, uncomplicated: Secondary | ICD-10-CM | POA: Diagnosis present

## 2022-12-16 DIAGNOSIS — Z79899 Other long term (current) drug therapy: Secondary | ICD-10-CM

## 2022-12-16 DIAGNOSIS — Z7982 Long term (current) use of aspirin: Secondary | ICD-10-CM

## 2022-12-16 DIAGNOSIS — E878 Other disorders of electrolyte and fluid balance, not elsewhere classified: Secondary | ICD-10-CM | POA: Diagnosis present

## 2022-12-16 LAB — PROTIME-INR
INR: 1.2 (ref 0.8–1.2)
Prothrombin Time: 15.7 s — ABNORMAL HIGH (ref 11.4–15.2)

## 2022-12-16 LAB — RESP PANEL BY RT-PCR (RSV, FLU A&B, COVID)  RVPGX2
Influenza A by PCR: NEGATIVE
Influenza B by PCR: NEGATIVE
Resp Syncytial Virus by PCR: NEGATIVE
SARS Coronavirus 2 by RT PCR: NEGATIVE

## 2022-12-16 LAB — COMPREHENSIVE METABOLIC PANEL
ALT: 17 U/L (ref 0–44)
AST: 24 U/L (ref 15–41)
Albumin: 3.9 g/dL (ref 3.5–5.0)
Alkaline Phosphatase: 79 U/L (ref 38–126)
Anion gap: 14 (ref 5–15)
BUN: 13 mg/dL (ref 8–23)
CO2: 23 mmol/L (ref 22–32)
Calcium: 9.4 mg/dL (ref 8.9–10.3)
Chloride: 96 mmol/L — ABNORMAL LOW (ref 98–111)
Creatinine, Ser: 1.01 mg/dL — ABNORMAL HIGH (ref 0.44–1.00)
GFR, Estimated: >60 mL/min (ref >60)
Glucose, Bld: 279 mg/dL — ABNORMAL HIGH (ref 70–99)
Potassium: 3.2 mmol/L — ABNORMAL LOW (ref 3.5–5.1)
Sodium: 133 mmol/L — ABNORMAL LOW (ref 135–145)
Total Bilirubin: 2 mg/dL — ABNORMAL HIGH (ref <1.2)
Total Protein: 7.8 g/dL (ref 6.5–8.1)

## 2022-12-16 LAB — URINALYSIS, W/ REFLEX TO CULTURE (INFECTION SUSPECTED)
Bilirubin Urine: NEGATIVE
Glucose, UA: 50 mg/dL — AB
Ketones, ur: 5 mg/dL — AB
Nitrite: POSITIVE — AB
Protein, ur: 30 mg/dL — AB
Specific Gravity, Urine: 1.006 (ref 1.005–1.030)
WBC, UA: >50 WBC/hpf (ref 0–5)
pH: 5 (ref 5.0–8.0)

## 2022-12-16 LAB — CBC WITH DIFFERENTIAL/PLATELET
Abs Immature Granulocytes: 0.56 10*3/uL — ABNORMAL HIGH (ref 0.00–0.07)
Basophils Absolute: 0.1 10*3/uL (ref 0.0–0.1)
Basophils Relative: 0 %
Eosinophils Absolute: 0 10*3/uL (ref 0.0–0.5)
Eosinophils Relative: 0 %
HCT: 36.6 % (ref 36.0–46.0)
Hemoglobin: 11.7 g/dL — ABNORMAL LOW (ref 12.0–15.0)
Immature Granulocytes: 2 %
Lymphocytes Relative: 3 %
Lymphs Abs: 0.7 10*3/uL (ref 0.7–4.0)
MCH: 22.8 pg — ABNORMAL LOW (ref 26.0–34.0)
MCHC: 32 g/dL (ref 30.0–36.0)
MCV: 71.2 fL — ABNORMAL LOW (ref 80.0–100.0)
Monocytes Absolute: 2.7 10*3/uL — ABNORMAL HIGH (ref 0.1–1.0)
Monocytes Relative: 10 %
Neutro Abs: 22.5 10*3/uL — ABNORMAL HIGH (ref 1.7–7.7)
Neutrophils Relative %: 85 %
Platelets: 189 10*3/uL (ref 150–400)
RBC: 5.14 MIL/uL — ABNORMAL HIGH (ref 3.87–5.11)
RDW: 15.9 % — ABNORMAL HIGH (ref 11.5–15.5)
Smear Review: NORMAL
WBC: 26.6 10*3/uL — ABNORMAL HIGH (ref 4.0–10.5)
nRBC: 0 % (ref 0.0–0.2)

## 2022-12-16 LAB — CREATININE, SERUM
Creatinine, Ser: 1.28 mg/dL — ABNORMAL HIGH (ref 0.44–1.00)
GFR, Estimated: 45 mL/min — ABNORMAL LOW (ref >60)

## 2022-12-16 LAB — LACTIC ACID, PLASMA
Lactic Acid, Venous: 2.3 mmol/L (ref 0.5–1.9)
Lactic Acid, Venous: 2.9 mmol/L (ref 0.5–1.9)

## 2022-12-16 MED ORDER — SODIUM CHLORIDE 0.9 % IV SOLN
500.0000 mg | INTRAVENOUS | Status: DC
  Administered 2022-12-16: 500 mg via INTRAVENOUS
  Filled 2022-12-16: qty 5

## 2022-12-16 MED ORDER — LACTATED RINGERS IV BOLUS (SEPSIS)
1000.0000 mL | Freq: Once | INTRAVENOUS | Status: AC
  Administered 2022-12-16: 1000 mL via INTRAVENOUS

## 2022-12-16 MED ORDER — OLOPATADINE HCL 0.1 % OP SOLN
1.0000 [drp] | Freq: Every day | OPHTHALMIC | Status: DC | PRN
Start: 2022-12-17 — End: 2022-12-20

## 2022-12-16 MED ORDER — LACTATED RINGERS IV SOLN
150.0000 mL/h | INTRAVENOUS | Status: DC
  Administered 2022-12-16: 150 mL/h via INTRAVENOUS

## 2022-12-16 MED ORDER — LACTATED RINGERS IV SOLN: INTRAVENOUS | Status: AC

## 2022-12-16 MED ORDER — LORATADINE 10 MG PO TABS
10.0000 mg | ORAL_TABLET | Freq: Every day | ORAL | Status: DC
  Administered 2022-12-17 – 2022-12-20 (×4): 10 mg via ORAL
  Filled 2022-12-16 (×4): qty 1

## 2022-12-16 MED ORDER — ACETAMINOPHEN 500 MG PO TABS
ORAL_TABLET | ORAL | Status: AC
  Administered 2022-12-16: 1000 mg via ORAL
  Filled 2022-12-16: qty 2

## 2022-12-16 MED ORDER — IOHEXOL 350 MG/ML SOLN
100.0000 mL | Freq: Once | INTRAVENOUS | Status: AC | PRN
  Administered 2022-12-16: 100 mL via INTRAVENOUS

## 2022-12-16 MED ORDER — SODIUM CHLORIDE 0.9% FLUSH
3.0000 mL | Freq: Two times a day (BID) | INTRAVENOUS | Status: DC
  Administered 2022-12-16 – 2022-12-19 (×6): 3 mL via INTRAVENOUS

## 2022-12-16 MED ORDER — TRIHEXYPHENIDYL HCL 2 MG PO TABS
2.0000 mg | ORAL_TABLET | Freq: Every day | ORAL | Status: DC
  Administered 2022-12-17 – 2022-12-20 (×3): 2 mg via ORAL
  Filled 2022-12-16 (×5): qty 1

## 2022-12-16 MED ORDER — ACETAMINOPHEN 500 MG PO TABS: 1000.0000 mg | ORAL_TABLET | Freq: Once | ORAL | Status: AC

## 2022-12-16 MED ORDER — ASPIRIN 81 MG PO TBEC
81.0000 mg | DELAYED_RELEASE_TABLET | Freq: Every day | ORAL | Status: DC
  Administered 2022-12-17 – 2022-12-20 (×4): 81 mg via ORAL
  Filled 2022-12-16 (×4): qty 1

## 2022-12-16 MED ORDER — OXYBUTYNIN CHLORIDE 5 MG PO TABS
5.0000 mg | ORAL_TABLET | Freq: Three times a day (TID) | ORAL | Status: DC
  Administered 2022-12-16 – 2022-12-20 (×11): 5 mg via ORAL
  Filled 2022-12-16 (×13): qty 1

## 2022-12-16 MED ORDER — POTASSIUM CHLORIDE CRYS ER 10 MEQ PO TBCR
10.0000 meq | EXTENDED_RELEASE_TABLET | Freq: Every day | ORAL | Status: DC
  Administered 2022-12-17: 10 meq via ORAL
  Filled 2022-12-16: qty 1

## 2022-12-16 MED ORDER — VITAMIN B-12 1000 MCG PO TABS
1000.0000 ug | ORAL_TABLET | Freq: Every day | ORAL | Status: DC
  Administered 2022-12-17 – 2022-12-20 (×4): 1000 ug via ORAL
  Filled 2022-12-16: qty 2
  Filled 2022-12-16 (×3): qty 1

## 2022-12-16 MED ORDER — ONDANSETRON 4 MG PO TBDP: 4.0000 mg | ORAL_TABLET | Freq: Three times a day (TID) | ORAL | Status: DC | PRN

## 2022-12-16 MED ORDER — METOPROLOL SUCCINATE ER 25 MG PO TB24: 12.5000 mg | ORAL_TABLET | Freq: Every day | ORAL | Status: DC

## 2022-12-16 MED ORDER — SODIUM CHLORIDE 0.9 % IV SOLN
2.0000 g | INTRAVENOUS | Status: DC
  Administered 2022-12-16 – 2022-12-17 (×2): 2 g via INTRAVENOUS
  Filled 2022-12-16 (×3): qty 20

## 2022-12-16 MED ORDER — HEPARIN SODIUM (PORCINE) 5000 UNIT/ML IJ SOLN
5000.0000 [IU] | Freq: Three times a day (TID) | INTRAMUSCULAR | Status: DC
  Administered 2022-12-16 – 2022-12-20 (×11): 5000 [IU] via SUBCUTANEOUS
  Filled 2022-12-16 (×11): qty 1

## 2022-12-16 MED ORDER — PANTOPRAZOLE SODIUM 40 MG PO TBEC: 40.0000 mg | DELAYED_RELEASE_TABLET | Freq: Every day | ORAL | Status: DC

## 2022-12-16 NOTE — ED Notes (Signed)
Pt to ct scan.

## 2022-12-16 NOTE — Progress Notes (Signed)
CODE SEPSIS - PHARMACY COMMUNICATION  **Broad Spectrum Antibiotics should be administered within 1 hour of Sepsis diagnosis**  Time Code Sepsis Called/Page Received: 11/21 @ 2211   Antibiotics Ordered: Ceftriaxone, Azithromycin   Time of 1st antibiotic administration: Ceftriaxone 2 gm IV X 1 on 11/21 @ 1952   Additional action taken by pharmacy:   If necessary, Name of Provider/Nurse Contacted:     Josia Cueva D ,PharmD Clinical Pharmacist  12/16/2022  10:25 PM

## 2022-12-16 NOTE — H&P (Signed)
History and Physical    Patient: Christy [name] WGN:[id number] DOB: 08-13-1955 DOA: 12/16/2022 DOS: the patient was seen and examined on 12/17/2022 PCP: Ellan Lambert, NP  Patient coming from: ALF/ILF  Chief Complaint:  Chief Complaint  Patient presents with   Altered Mental Status    HPI: Christy [name] is a 67 y.o. female with medical history significant for diabetes mellitus type 2, hypertension, hypothyroidism, schizophrenia, asthma, arthritis presenting with complaints of altered mental status  noted by staff, noted to have fever of 102 yesterday at 4 L O2 sat of 94 has CPAP at night and complained of stomach pain. Patient met severe sepsis criteria code sepsis activated and was started on IV antibiotics.  Patient is a limited historian.  Chart review shows that patient is diabetic, is also noted to be on Lomotil suspect patient may be having diarrhea or chronic bleed causing her anemia that is fairly recent from june of this year could also be from GI side effects from metformin., high blood pressure, glaucoma.  Patient states that she knew she was not feeling well and she had to come so she came today to the emergency room.  Reports that she lives in a group home.  Able to move all extremities in bed.  In emergency room vitals trend shows: Vitals:   12/17/22 0048 12/17/22 0052 12/17/22 0100 12/17/22 0130  BP:   (!) 130/59 114/61  Pulse: 96  92 97  Temp:  98 F (36.7 C)    Resp:      SpO2: 100%  99% 98%  TempSrc:  Oral    Initial EKG sinus tach at 136 with normal axis and intervals.  Abnormal urinalysis with nitrite positive urine.  Respiratory panel negative for influenza RSV and COVID. Metabolic panel showing hypokalemia of 3.2 glucose 279 creatinine 1.41 total bili 2 point lactic acid 2.3 leukocytosis of 26.6 anemia with a hemoglobin of 1.7 platelet count 189. Rising lactic acid of 2.9 from 2.3. Labs: Hypokalemia of 3.2, 279 glucose ,creatinine of 1.01 on initial cmp.  Total  bilirubin of 2.0. Lactic acid of 2.3 repeat of 2.9. Leukocytosis of 26.6 hemoglobin of 11.7 MCV of 71.2. Abnormal urinalysis with nitrite positive large leukocytes. CT of the chest done negative for pulmonary embolism.  In the ED pt received: Medications  lactated ringers infusion ( Intravenous New Bag/Given 12/16/22 1951)  cefTRIAXone (ROCEPHIN) 2 g in sodium chloride 0.9 % 100 mL IVPB (0 g Intravenous Stopped 12/16/22 2040)  azithromycin (ZITHROMAX) 500 mg in sodium chloride 0.9 % 250 mL IVPB (0 mg Intravenous Stopped 12/16/22 2040)  loratadine (CLARITIN) tablet 10 mg (has no administration in time range)  ondansetron (ZOFRAN-ODT) disintegrating tablet 4 mg (has no administration in time range)  aspirin EC tablet 81 mg (has no administration in time range)  oxybutynin (DITROPAN) tablet 5 mg (5 mg Oral Given 12/16/22 2312)  olopatadine (PATANOL) 0.1 % ophthalmic solution 1 drop (has no administration in time range)  trihexyphenidyl (ARTANE) tablet 2 mg (has no administration in time range)  cyanocobalamin (VITAMIN B12) tablet 1,000 mcg (has no administration in time range)  potassium chloride (KLOR-CON) CR tablet 10 mEq (has no administration in time range)  sodium chloride flush (NS) 0.9 % injection 3 mL (3 mLs Intravenous Given 12/16/22 2300)  heparin injection 5,000 Units (5,000 Units Subcutaneous Given 12/16/22 2301)  pantoprazole (PROTONIX) injection 40 mg (has no administration in time range)  nicotine (NICODERM CQ - dosed in mg/24 hours) patch 21 mg (  has no administration in time range)  lactated ringers bolus 1,000 mL (0 mLs Intravenous Stopped 12/16/22 2133)  acetaminophen (TYLENOL) tablet 1,000 mg (1,000 mg Oral Given 12/16/22 1953)  iohexol (OMNIPAQUE) 350 MG/ML injection 100 mL (100 mLs Intravenous Contrast Given 12/16/22 2140)   Review of Systems  Unable to perform ROS: Psychiatric disorder   Past Medical History:  Diagnosis Date   Arthritis    Asthma    Diabetes  mellitus without complication (HCC)    Patient takes Metformin   Edema    Genital herpes    GERD (gastroesophageal reflux disease)    Hyperlipidemia    Hypertension    Malaise and fatigue    Mental status alteration    Schizophrenia (HCC)    Past Surgical History:  Procedure Laterality Date   ABDOMINAL HYSTERECTOMY     due to fibroids in 1997   CHOLECYSTECTOMY     COLONOSCOPY WITH PROPOFOL N/A 08/15/2015   Procedure: COLONOSCOPY WITH PROPOFOL;  Surgeon: Christena Deem, MD;  Location: [facility] ENDOSCOPY;  Service: Endoscopy;  Laterality: N/A;   ESOPHAGOGASTRODUODENOSCOPY (EGD) WITH PROPOFOL N/A 08/15/2015   Procedure: ESOPHAGOGASTRODUODENOSCOPY (EGD) WITH PROPOFOL;  Surgeon: Christena Deem, MD;  Location: [facility] ENDOSCOPY;  Service: Endoscopy;  Laterality: N/A;   FOOT SURGERY Right    GANGLION CYST EXCISION     TONSILLECTOMY     UPPER GI ENDOSCOPY  07/31/13   mild chronic inflammation and reactive gastropathy-no need for another EGD repeat    reports that she has been smoking cigarettes. She has never used smokeless tobacco. She reports current alcohol use. She reports that she does not use drugs.  Allergies  Allergen Reactions   Benzoyl Peroxide    Cephalexin Itching   Peroxide [Hydrogen Peroxide] Swelling   Shellfish Allergy Nausea And Vomiting    Family History  Problem Relation Age of Onset   Diabetes Mother    Pulmonary embolism Mother    Diabetes Father     Prior to Admission medications   Medication Sig Start Date End Date Taking? Authorizing Provider  ACCU-CHEK SOFTCLIX LANCETS lancets Check sugar once daily  DX E11.9 01/10/17   Bosie Clos, MD  acetaminophen (TYLENOL) 500 MG tablet Take 2 tablets by mouth in the morning, at noon, and at bedtime.    [provider]  aspirin 81 MG EC tablet Take 1 tablet by mouth daily.    [provider]  dicyclomine (BENTYL) 10 MG capsule Take 1 capsule (10 mg total) by mouth 2 (two) times daily. 09/09/17  09/10/20  Bosie Clos, MD  diphenoxylate-atropine (LOMOTIL) 2.5-0.025 MG tablet Take 1 tablet by mouth 2 (two) times daily as needed for diarrhea or loose stools.    [provider]  FLUPHENAZINE DECANOATE IJ Inject 25 mg into the muscle every 21 ( twenty-one) days.    [provider]  furosemide (LASIX) 40 MG tablet Take 1 tablet (40 mg total) by mouth 2 (two) times daily. 04/28/18   Bosie Clos, MD  glimepiride (AMARYL) 4 MG tablet Two tablets daily with breakfast 11/23/17   Bosie Clos, MD  glucose blood (ACCU-CHEK AVIVA) test strip Check sugar once daily DX E11.9 12/21/16   Bosie Clos, MD  Lancet Devices [facility]) lancets Check sugar once daily, DX E11.9 08/13/15   Bosie Clos, MD  loratadine (CLARITIN) 10 MG tablet Take 1 tablet (10 mg total) by mouth daily. 08/17/18   Bosie Clos, MD  losartan (COZAAR) 50 MG  tablet Take 1 tablet (50 mg total) by mouth daily. 07/25/18   Bosie Clos, MD  Menthol, Topical Analgesic, (ICY HOT ORIGINAL PAIN RELIEF) 2.5 % GEL Apply 1 application topically in the morning, at noon, and at bedtime. To both knees    [provider]  metFORMIN (GLUCOPHAGE) 1000 MG tablet Take 1 tablet (1,000 mg total) by mouth 2 (two) times daily with a meal. 07/18/18   Bosie Clos, MD  metoprolol succinate (TOPROL-XL) 25 MG 24 hr tablet Take 0.5 tablets (12.5 mg total) by mouth daily. 11/10/17   Bosie Clos, MD  Multiple Vitamins-Minerals (MULTI-VITAMIN GUMMIES) CHEW Chew 2 tablets by mouth daily at 12 noon.    [provider]  olopatadine (PATANOL) 0.1 % ophthalmic solution Place 1 drop into both eyes daily at 12 noon.    [provider]  ondansetron (ZOFRAN-ODT) 4 MG disintegrating tablet Take 1 tablet (4 mg total) by mouth every 8 (eight) hours as needed for nausea or vomiting. 05/29/22   Menshew, Charlesetta Ivory, PA-C  oxybutynin (DITROPAN) 5 MG tablet Take 1 tablet by mouth  in the morning, at noon, and at bedtime.    [provider]  pantoprazole (PROTONIX) 40 MG tablet Take 40 mg by mouth daily.    [provider]  potassium chloride (KLOR-CON) 10 MEQ tablet Take 1 tablet by mouth daily.    [provider]  simvastatin (ZOCOR) 10 MG tablet Take 1 tablet (10 mg total) by mouth daily. 01/20/18   Bosie Clos, MD  traMADol (ULTRAM) 50 MG tablet Take 1 tablet by mouth in the morning, at noon, and at bedtime.  Give 1 tablet q 12h prn breakthrough pain. Hold for lethargy    [provider]  trihexyphenidyl (ARTANE) 2 MG tablet Take 1 tablet by mouth daily.    [provider]  valACYclovir (VALTREX) 1000 MG tablet Take 1 tablet (1,000 mg total) by mouth daily. 05/24/18   Bosie Clos, MD  vitamin B-12 (CYANOCOBALAMIN) 1000 MCG tablet Take 1,000 mcg by mouth daily.    [provider]     Vitals:   12/17/22 9604 12/17/22 0052 12/17/22 0100 12/17/22 0130  BP:   (!) 130/59 114/61  Pulse: 96  92 97  Resp:      Temp:  98 F (36.7 C)    TempSrc:  Oral    SpO2: 100%  99% 98%   Physical Exam Vitals and nursing note reviewed.  Constitutional:      General: She is not in acute distress. HENT:     Head: Normocephalic and atraumatic.     Right Ear: Hearing normal.     Left Ear: Hearing normal.     Nose: Nose normal. No nasal deformity.     Mouth/Throat:     Lips: Pink.     Tongue: No lesions.     Pharynx: Oropharynx is clear.  Eyes:     General: Lids are normal.     Extraocular Movements: Extraocular movements intact.  Cardiovascular:     Rate and Rhythm: Normal rate and regular rhythm.     Heart sounds: Normal heart sounds.  Pulmonary:     Effort: Pulmonary effort is normal.     Breath sounds: Normal breath sounds.  Abdominal:     General: Bowel sounds are normal. There is no distension.     Palpations: Abdomen is soft. There is no mass.     Tenderness: There is no abdominal tenderness.  Musculoskeletal:     Right lower leg: No edema.     Left lower leg: No edema.  Skin:    General: Skin is warm.  Neurological:     General: No focal deficit present.     Mental Status: She is alert and oriented to person, place, and time.     Cranial Nerves: Cranial nerves 2-12 are intact.     Motor: Tremor present.  Psychiatric:        Attention and Perception: Attention normal.        Mood and Affect: Mood normal.        Speech: Speech is delayed.        Behavior: Behavior normal. Behavior is cooperative.      Labs on Admission: I have personally reviewed following labs and imaging studies Results for orders placed or performed during the hospital encounter of 12/16/22 (from the past 24 hour(s))  Comprehensive metabolic panel     Status: Abnormal   Collection Time: 12/16/22  6:47 PM  Result Value Ref Range   Sodium 133 (L) 135 - 145 mmol/L   Potassium 3.2 (L) 3.5 - 5.1 mmol/L   Chloride 96 (L) 98 - 111 mmol/L   CO2 23 22 - 32 mmol/L   Glucose, Bld 279 (H) 70 - 99 mg/dL   BUN 13 8 - 23 mg/dL   Creatinine, Ser 4.09 (H) 0.44 - 1.00 mg/dL   Calcium 9.4 8.9 - 81.1 mg/dL   Total Protein 7.8 6.5 - 8.1 g/dL   Albumin 3.9 3.5 - 5.0 g/dL   AST 24 15 - 41 U/L   ALT 17 0 - 44 U/L   Alkaline Phosphatase 79 38 - 126 U/L   Total Bilirubin 2.0 (H) <1.2 mg/dL   GFR, Estimated >91 >47 mL/min   Anion gap 14 5 - 15  Lactic acid, plasma     Status: Abnormal   Collection Time: 12/16/22  6:47 PM  Result Value Ref Range   Lactic Acid, Venous 2.3 (HH) 0.5 - 1.9 mmol/L  CBC with Differential     Status: Abnormal   Collection Time: 12/16/22  6:47 PM  Result Value Ref Range   WBC 26.6 (H) 4.0 - 10.5 K/uL   RBC 5.14 (H) 3.87 - 5.11 MIL/uL   Hemoglobin 11.7 (L) 12.0 - 15.0 g/dL   HCT 82.9 56.2 - 13.0 %   MCV 71.2 (L) 80.0 - 100.0 fL   MCH 22.8 (L) 26.0 - 34.0 pg   MCHC 32.0 30.0 - 36.0 g/dL   RDW 86.5 (H) 78.4 - 69.6 %   Platelets 189 150 - 400 K/uL   nRBC 0.0 0.0 - 0.2 %   Neutrophils  Relative % 85 %   Neutro Abs 22.5 (H) 1.7 - 7.7 K/uL   Lymphocytes Relative 3 %   Lymphs Abs 0.7 0.7 - 4.0 K/uL   Monocytes Relative 10 %   Monocytes Absolute 2.7 (H) 0.1 - 1.0 K/uL   Eosinophils Relative 0 %   Eosinophils Absolute 0.0 0.0 - 0.5 K/uL   Basophils Relative 0 %   Basophils Absolute 0.1 0.0 - 0.1 K/uL   WBC Morphology Mild Left Shift (1-5% metas, occ myelo)    RBC Morphology MORPHOLOGY UNREMARKABLE    Smear Review Normal platelet morphology    Immature Granulocytes 2 %   Abs Immature Granulocytes 0.56 (H) 0.00 - 0.07 K/uL  Protime-INR     Status: Abnormal   Collection Time: 12/16/22  6:47 PM  Result  Value Ref Range   Prothrombin Time 15.7 (H) 11.4 - 15.2 seconds   INR 1.2 0.8 - 1.2  Resp panel by RT-PCR (RSV, Flu A&B, Covid) Anterior Nasal Swab     Status: None   Collection Time: 12/16/22  6:47 PM   Specimen: Anterior Nasal Swab  Result Value Ref Range   SARS Coronavirus 2 by RT PCR NEGATIVE NEGATIVE   Influenza A by PCR NEGATIVE NEGATIVE   Influenza B by PCR NEGATIVE NEGATIVE   Resp Syncytial Virus by PCR NEGATIVE NEGATIVE  Urinalysis, w/ Reflex to Culture (Infection Suspected) -Urine, Clean Catch     Status: Abnormal   Collection Time: 12/16/22  6:59 PM  Result Value Ref Range   Specimen Source URINE, CATHETERIZED    Color, Urine YELLOW (A) YELLOW   APPearance CLOUDY (A) CLEAR   Specific Gravity, Urine 1.006 1.005 - 1.030   pH 5.0 5.0 - 8.0   Glucose, UA 50 (A) NEGATIVE mg/dL   Hgb urine dipstick MODERATE (A) NEGATIVE   Bilirubin Urine NEGATIVE NEGATIVE   Ketones, ur 5 (A) NEGATIVE mg/dL   Protein, ur 30 (A) NEGATIVE mg/dL   Nitrite POSITIVE (A) NEGATIVE   Leukocytes,Ua LARGE (A) NEGATIVE   RBC / HPF 11-20 0 - 5 RBC/hpf   WBC, UA >50 0 - 5 WBC/hpf   Bacteria, UA RARE (A) NONE SEEN   Squamous Epithelial / HPF 0-5 0 - 5 /HPF   WBC Clumps PRESENT    Mucus PRESENT   Lactic acid, plasma     Status: Abnormal   Collection Time: 12/16/22  9:00 PM  Result  Value Ref Range   Lactic Acid, Venous 2.9 (HH) 0.5 - 1.9 mmol/L  Creatinine, serum     Status: Abnormal   Collection Time: 12/16/22 11:02 PM  Result Value Ref Range   Creatinine, Ser 1.28 (H) 0.44 - 1.00 mg/dL   GFR, Estimated 45 (L) >60 mL/min  CBC     Status: Abnormal   Collection Time: 12/16/22 11:50 PM  Result Value Ref Range   WBC 26.7 (H) 4.0 - 10.5 K/uL   RBC 4.45 3.87 - 5.11 MIL/uL   Hemoglobin 10.3 (L) 12.0 - 15.0 g/dL   HCT 14.7 (L) 82.9 - 56.2 %   MCV 71.2 (L) 80.0 - 100.0 fL   MCH 23.1 (L) 26.0 - 34.0 pg   MCHC 32.5 30.0 - 36.0 g/dL   RDW 13.0 (H) 86.5 - 78.4 %   Platelets 167 150 - 400 K/uL   nRBC 0.0 0.0 - 0.2 %    CBC: Recent Labs  Lab 12/16/22 1847 12/16/22 2350  WBC 26.6* 26.7*  NEUTROABS 22.5*  --   HGB 11.7* 10.3*  HCT 36.6 31.7*  MCV 71.2* 71.2*  PLT 189 167   Basic Metabolic Panel: Recent Labs  Lab 12/16/22 1847 12/16/22 2302  NA 133*  --   K 3.2*  --   CL 96*  --   CO2 23  --   GLUCOSE 279*  --   BUN 13  --   CREATININE 1.01* 1.28*  CALCIUM 9.4  --    GFR: CrCl cannot be calculated (Unknown ideal weight.). Liver Function Tests: Recent Labs  Lab 12/16/22 1847  AST 24  ALT 17  ALKPHOS 79  BILITOT 2.0*  PROT 7.8  ALBUMIN 3.9   No results for input(s): "LIPASE", "AMYLASE" in the last 168 hours. No results for input(s): "AMMONIA" in the last 168 hours. Coagulation Profile: Recent Labs  Lab  12/16/22 1847  INR 1.2   Cardiac Enzymes: No results for input(s): "CKTOTAL", "CKMB", "CKMBINDEX", "TROPONINI" in the last 168 hours. BNP (last 3 results) No results for input(s): "PROBNP" in the last 8760 hours. HbA1C: No results for input(s): "HGBA1C" in the last 72 hours. CBG: No results for input(s): "GLUCAP" in the last 168 hours. Lipid Profile: No results for input(s): "CHOL", "HDL", "LDLCALC", "TRIG", "CHOLHDL", "LDLDIRECT" in the last 72 hours. Thyroid Function Tests: No results for input(s): "TSH", "T4TOTAL", "FREET4",  "T3FREE", "THYROIDAB" in the last 72 hours. Anemia Panel: No results for input(s): "VITAMINB12", "FOLATE", "FERRITIN", "TIBC", "IRON", "RETICCTPCT" in the last 72 hours. Urinalysis    Component Value Date/Time   COLORURINE YELLOW (A) 12/16/2022 1859   APPEARANCEUR CLOUDY (A) 12/16/2022 1859   APPEARANCEUR Clear 08/11/2012 1543   LABSPEC 1.006 12/16/2022 1859   LABSPEC 1.012 08/11/2012 1543   PHURINE 5.0 12/16/2022 1859   GLUCOSEU 50 (A) 12/16/2022 1859   GLUCOSEU Negative 08/11/2012 1543   HGBUR MODERATE (A) 12/16/2022 1859   BILIRUBINUR NEGATIVE 12/16/2022 1859   BILIRUBINUR negative 09/23/2016 1616   BILIRUBINUR Negative 08/11/2012 1543   KETONESUR 5 (A) 12/16/2022 1859   PROTEINUR 30 (A) 12/16/2022 1859   UROBILINOGEN 0.2 09/23/2016 1616   NITRITE POSITIVE (A) 12/16/2022 1859   LEUKOCYTESUR LARGE (A) 12/16/2022 1859   LEUKOCYTESUR 1+ 08/11/2012 1543    Unresulted Labs (From admission, onward)     Start     Ordered   12/17/22 0500  Protime-INR  Tomorrow morning,   R        12/16/22 2213   12/17/22 0500  Cortisol-am, blood  Tomorrow morning,   R        12/16/22 2213   12/17/22 0500  Procalcitonin  Tomorrow morning,   R       References:    Procalcitonin Lower Respiratory Tract Infection AND Sepsis Procalcitonin Algorithm   12/16/22 2213   12/17/22 0500  Occult blood card to lab, stool  Daily,   R      12/17/22 0332   12/17/22 0333  Type and screen  Once,   R        12/17/22 0332   12/17/22 0328  Hemoglobin A1c  Add-on,   AD        12/17/22 0329   12/16/22 2211  HIV Antibody (routine testing w rflx)  (HIV Antibody (Routine testing w reflex) panel)  Once,   R        12/16/22 2213   12/16/22 1859  Culture, blood (Routine x 2)  BLOOD CULTURE X 2,   STAT      12/16/22 1859   12/16/22 1859  Urine Culture  Once,   R        12/16/22 1859            Medications  lactated ringers infusion ( Intravenous New Bag/Given 12/16/22 1951)  cefTRIAXone (ROCEPHIN) 2 g in sodium  chloride 0.9 % 100 mL IVPB (0 g Intravenous Stopped 12/16/22 2040)  azithromycin (ZITHROMAX) 500 mg in sodium chloride 0.9 % 250 mL IVPB (0 mg Intravenous Stopped 12/16/22 2040)  loratadine (CLARITIN) tablet 10 mg (has no administration in time range)  ondansetron (ZOFRAN-ODT) disintegrating tablet 4 mg (has no administration in time range)  aspirin EC tablet 81 mg (has no administration in time range)  oxybutynin (DITROPAN) tablet 5 mg (5 mg Oral Given 12/16/22 2312)  olopatadine (PATANOL) 0.1 % ophthalmic solution 1 drop (has no administration in time range)  trihexyphenidyl (ARTANE) tablet 2 mg (has no administration in time range)  cyanocobalamin (VITAMIN B12) tablet 1,000 mcg (has no administration in time range)  potassium chloride (KLOR-CON) CR tablet 10 mEq (has no administration in time range)  sodium chloride flush (NS) 0.9 % injection 3 mL (3 mLs Intravenous Given 12/16/22 2300)  heparin injection 5,000 Units (5,000 Units Subcutaneous Given 12/16/22 2301)  pantoprazole (PROTONIX) injection 40 mg (has no administration in time range)  nicotine (NICODERM CQ - dosed in mg/24 hours) patch 21 mg (has no administration in time range)  lactated ringers bolus 1,000 mL (0 mLs Intravenous Stopped 12/16/22 2133)  acetaminophen (TYLENOL) tablet 1,000 mg (1,000 mg Oral Given 12/16/22 1953)  iohexol (OMNIPAQUE) 350 MG/ML injection 100 mL (100 mLs Intravenous Contrast Given 12/16/22 2140)    Radiological Exams on Admission: CT Angio Chest PE W/Cm &/Or Wo Cm  Result Date: 12/16/2022 CLINICAL DATA:  Fevers and possible sepsis, initial encounter EXAM: CT ANGIOGRAPHY CHEST WITH CONTRAST TECHNIQUE: Multidetector CT imaging of the chest was performed using the standard protocol during bolus administration of intravenous contrast. Multiplanar CT image reconstructions and MIPs were obtained to evaluate the vascular anatomy. RADIATION DOSE REDUCTION: This exam was performed according to the departmental  dose-optimization program which includes automated exposure control, adjustment of the mA and/or kV according to patient size and/or use of iterative reconstruction technique. CONTRAST:  OMNIPAQUE IOHEXOL 350 MG/ML SOLN COMPARISON:  Chest x-ray from earlier in the same day. FINDINGS: Cardiovascular: Thoracic aorta is within normal limits. No cardiac enlargement is noted. The pulmonary artery shows a normal branching pattern bilaterally. No definitive filling defect to suggest pulmonary embolism is noted. No significant coronary calcifications are noted. Mediastinum/Nodes: Thoracic inlet is within normal limits. No hilar or mediastinal adenopathy is noted. The esophagus as visualized is within normal limits. Lungs/Pleura: Lungs are well aerated bilaterally. No focal infiltrate or effusion is seen. Upper Abdomen: Visualized upper abdomen is unremarkable. Musculoskeletal: Bony structures appear within normal limits. Review of the MIP images confirms the above findings. IMPRESSION: No evidence of pulmonary emboli. No acute abnormality seen. Electronically Signed   By: Alcide Clever M.D.   On: 12/16/2022 21:50   DG Chest 1 View  Result Date: 12/16/2022 CLINICAL DATA:  Sepsis, altered mental status EXAM: CHEST  1 VIEW COMPARISON:  07/20/2022 FINDINGS: Lungs are clear. Heart size and mediastinal contours are within normal limits. No effusion. Visualized bones unremarkable. IMPRESSION: No acute cardiopulmonary disease. Electronically Signed   By: Corlis Leak M.D.   On: 12/16/2022 19:56     Data Reviewed: Relevant notes from primary care and specialist visits, past discharge summaries as available in EHR, including Care Everywhere. Prior diagnostic testing as pertinent to current admission diagnoses Updated medications and problem lists for reconciliation ED course, including vitals, labs, imaging, treatment and response to treatment Triage notes, nursing and pharmacy notes and ED provider's notes Notable  results as noted in HPI  Assessment and Plan: Assessment & Plan AMS (altered mental status) Patient during  my assessment at bedside is awake alert oriented, has baseline tremors at rest.  No documented history of Parkinson's.  Exam is nonfocal otherwise patient is weak at baseline.  Follows commands and is cooperative.  Current tobacco use Nicotine patch.  Counseling once patient is stable Hypertension Vitals:   12/16/22 1839 12/16/22 2040 12/16/22 2100 12/16/22 2130  BP: (!) 172/74 133/81 135/65 124/64   12/16/22 2200 12/16/22 2230 12/16/22 2300 12/17/22 0000  BP: (!) 118/57 122/60 120/67 113/61  12/17/22 0030 12/17/22 0100 12/17/22 0130  BP: (!) 124/57 (!) 130/59 114/61  Blood pressure has been fluctuating patient did have hypotension at 1 point where her systolic was in the 80s, but that was transient and brief and based on repeat vitals is resolved. Will therefore hold patient's diuretic therapy metoprolol. Will also hold patient's Cozaar. As needed hydralazine to be ordered if indicated as needed. Controlled type 2 diabetes mellitus without complication, without long-term current use of insulin (HCC) Will currently hold patient's home regimen of Amaryl Cozaar and metformin to prevent hypoglycemia. Patient will be managed on glycemic protocol .  Last A1c 4 years ago based on our documentation was 8 we will repeat an A1c. Severe sepsis [facility]) Patient meets severe sepsis criteria with vitals white count source of infection being her urine lactic acidosis. CBC    Component Value Date/Time   WBC 26.7 (H) 12/16/2022 2350   RBC 4.45 12/16/2022 2350   HGB 10.3 (L) 12/16/2022 2350   HGB 13.1 07/25/2018 1157   HCT 31.7 (L) 12/16/2022 2350   HCT 41.6 07/25/2018 1157   PLT 167 12/16/2022 2350   PLT 315 07/25/2018 1157   MCV 71.2 (L) 12/16/2022 2350   MCV 70 (L) 07/25/2018 1157   MCV 68 (L) 03/16/2013 1256   MCH 23.1 (L) 12/16/2022 2350   MCHC 32.5 12/16/2022 2350   RDW 15.6 (H)  12/16/2022 2350   RDW 16.2 (H) 07/25/2018 1157   RDW 17.1 (H) 03/16/2013 1256   LYMPHSABS 0.7 12/16/2022 1847   LYMPHSABS 1.8 07/25/2018 1157   MONOABS 2.7 (H) 12/16/2022 1847   EOSABS 0.0 12/16/2022 1847   EOSABS 0.4 07/25/2018 1157   BASOSABS 0.1 12/16/2022 1847   BASOSABS 0.1 07/25/2018 1157    12/16/22 18:47 12/16/22 21:00  Lactic Acid, Venous 2.3 (HH) 2.9 (HH)    azithromycin Stopped (12/16/22 2040)   cefTRIAXone (ROCEPHIN)  IV Stopped (12/16/22 2040)   lactated ringers 150 mL/hr at 12/16/22 1951   No intake or output data in the 24 hours ending 12/17/22 1610  Urinary tract infection Patient currently continued on Rocephin and azithromycin as initiated in the emergency room. Anemia    Latest Ref Rng & Units 12/16/2022   11:50 PM 12/16/2022    6:47 PM 07/20/2022   12:14 PM  CBC  WBC 4.0 - 10.5 K/uL 26.7  26.6  7.7   Hemoglobin 12.0 - 15.0 g/dL 96.0  45.4  09.8   Hematocrit 36.0 - 46.0 % 31.7  36.6  39.8   Platelets 150 - 400 K/uL 167  189  253   Is new since June of this year. Will obtain stool occult. IV PPI therapy. Type and screen. Additional evaluation based on results.   Electrolyte abnormality Will get pharmacy on board for trend management and correction.   DVT prophylaxis:  Heparin   Consults:  None   Advance Care Planning:    Code Status: Full Code   Family Communication:  none  Disposition Plan:  Group home   Severity of Illness: The appropriate patient status for this patient is INPATIENT. Inpatient status is judged to be reasonable and necessary in order to provide the required intensity of service to ensure the patient's safety. The patient's presenting symptoms, physical exam findings, and initial radiographic and laboratory data in the context of their chronic comorbidities is felt to place them at high risk for further clinical deterioration. Furthermore, it is not anticipated that the patient will be medically stable for discharge from  the hospital within 2 midnights of admission.   * I certify that at the point of admission it is my clinical judgment that the patient will require inpatient hospital care spanning beyond 2 midnights from the point of admission due to high intensity of service, high risk for further deterioration and high frequency of surveillance required.*  Author: Gertha Calkin, MD 12/17/2022 3:33 AM  For on call review [email or web address].  Orders Placed This Encounter  Procedures   1-3 Lead EKG Interpretation    This order was created via procedure documentation    Standing Status:   Standing    Number of Occurrences:   1   Culture, blood (Routine x 2)    Standing Status:   Standing    Number of Occurrences:   2   Resp panel by RT-PCR (RSV, Flu A&B, Covid) Anterior Nasal Swab    Standing Status:   Standing    Number of Occurrences:   1   Urine Culture    Standing Status:   Standing    Number of Occurrences:   1   DG Chest 1 View    Standing Status:   Standing    Number of Occurrences:   1    Order Specific Question:   Symptom/Reason for Exam    Answer:   Sepsis (HCC) [[id number]]   CT Angio Chest PE W/Cm &/Or Wo Cm    Standing Status:   Standing    Number of Occurrences:   1    Order Specific Question:   Does the patient have a contrast media/X-ray dye allergy?    Answer:   No    Order Specific Question:   If indicated for the ordered procedure, I authorize the administration of contrast media per Radiology protocol    Answer:   Yes    Order Specific Question:   Radiology Contrast Protocol - do NOT remove file path    Answer:   [email or web address]   Comprehensive metabolic panel    Standing Status:   Standing    Number of Occurrences:   1   Lactic acid, plasma    Standing Status:   Standing    Number of Occurrences:   2   CBC with Differential    Standing Status:   Standing    Number of Occurrences:   1   Protime-INR    Standing Status:   Standing     Number of Occurrences:   1   Urinalysis, w/ Reflex to Culture (Infection Suspected) -Urine, Clean Catch    Standing Status:   Standing    Number of Occurrences:   1    Order Specific Question:   Specimen Source    Answer:   Urine, Clean Catch [76]   HIV Antibody (routine testing w rflx)    Standing Status:   Standing    Number of Occurrences:   1   Protime-INR    Standing Status:   Standing    Number of Occurrences:   1   Cortisol-am, blood    Standing Status:   Standing    Number of Occurrences:   1   Procalcitonin    Standing Status:   Standing    Number of Occurrences:   1   Creatinine, serum    Baseline for heparin therapy IF NOT ALREADY DRAWN.    Standing Status:   Standing    Number of Occurrences:   1   CBC    Standing Status:  Standing    Number of Occurrences:   1   Hemoglobin A1c    Standing Status:   Standing    Number of Occurrences:   1   Occult blood card to lab, stool    Standing Status:   Standing    Number of Occurrences:   3   Diet NPO time specified    Standing Status:   Standing    Number of Occurrences:   1   Notify physician (specify)  Specify: Notify provider for possible Code Sepsis    Standing Status:   Standing    Number of Occurrences:   1    Order Specific Question:   Specify    Answer:   Notify provider for possible Code Sepsis   Document height and weight    Standing Status:   Standing    Number of Occurrences:   1   Assess and Document Glasgow Coma Scale    Standing Status:   Standing    Number of Occurrences:   1   Document vital signs within 1-hour of fluid bolus completion. Notify provider of abnormal vital signs despite fluid resuscitation.    Standing Status:   Standing    Number of Occurrences:   1   DO NOT delay antibiotics if unable to obtain blood culture.    Standing Status:   Standing    Number of Occurrences:   1   Refer to Sidebar Report: Sepsis Sidebar ED/IP    Sepsis Sidebar ED/IP    Standing Status:   Standing     Number of Occurrences:   1   Insert peripheral IV x 2    Angiocath size 20G or larger    Standing Status:   Standing    Number of Occurrences:   1   Initiate Carrier Fluid Protocol    Standing Status:   Standing    Number of Occurrences:   1   In and Out Cath    Standing Status:   Standing    Number of Occurrences:   1   Cardiac Monitoring Continuous x 24 hours Indications for use: Other; other indications for use: Sepsis    Standing Status:   Standing    Number of Occurrences:   1    Order Specific Question:   Indications for use:    Answer:   Other    Order Specific Question:   other indications for use:    Answer:   Sepsis   Maintain IV access    Standing Status:   Standing    Number of Occurrences:   1   Vital signs    Standing Status:   Standing    Number of Occurrences:   1   Notify physician (specify)    Standing Status:   Standing    Number of Occurrences:   20    Order Specific Question:   Notify Physician    Answer:   for pulse less than 55 or greater than 120    Order Specific Question:   Notify Physician    Answer:   for respiratory rate less than 12 or greater than 25    Order Specific Question:   Notify Physician    Answer:   for temperature greater than 100.5 F    Order Specific Question:   Notify Physician    Answer:   for urinary output less than 30 mL/hr for four hours    Order Specific Question:   Notify Physician  Answer:   for systolic BP less than 90 or greater than 160, diastolic BP less than 60 or greater than 100    Order Specific Question:   Notify Physician    Answer:   for new hypoxia w/ oxygen saturations < 88%   Mobility Protocol: No Restrictions    RN to initiate protocols based on patient's level of care    Standing Status:   Standing    Number of Occurrences:   1   Refer to Sidebar Report Refer to ICU, Med-Surg, Progressive, and Step-Down Mobility Protocol Sidebars    Refer to ICU, Med-Surg, Progressive, and Step-Down Mobility Protocol  Sidebars    Standing Status:   Standing    Number of Occurrences:   1   If lactate (lactic acid) >2, verify repeat lactic acid order has been placed to be drawn    Standing Status:   Standing    Number of Occurrences:   1   Document vital signs within 1-hour of fluid bolus completion and notify provider of bolus completion    Standing Status:   Standing    Number of Occurrences:   1   Vital signs    Standing Status:   Standing    Number of Occurrences:   1   Vital signs    Standing Status:   Standing    Number of Occurrences:   1   Apply Sepsis Care Plan    Standing Status:   Standing    Number of Occurrences:   1   Refer to Sidebar Report: Sepsis Bundle ED/IP    Sepsis Bundle ED/IP    Standing Status:   Standing    Number of Occurrences:   1   Assess and Document Glasgow Coma Scale    Standing Status:   Standing    Number of Occurrences:   1   If patient diabetic or glucose greater than 140 notify physician for Sliding Scale Insulin Orders    Standing Status:   Standing    Number of Occurrences:   20   Initiate Oral Care Protocol    Standing Status:   Standing    Number of Occurrences:   1   Initiate Carrier Fluid Protocol    Standing Status:   Standing    Number of Occurrences:   1   RN may order General Admission PRN Orders utilizing "General Admission PRN medications" (through manage orders) for the following patient needs: allergy symptoms (Claritin), cold sores (Carmex), cough (Robitussin DM), eye irritation (Liquifilm Tears), hemorrhoids (Tucks), indigestion (Maalox), minor skin irritation (Hydrocortisone Cream), muscle pain Romeo Apple Gay), nose irritation (saline nasal spray) and sore throat (Chloraseptic spray).    Standing Status:   Standing    Number of Occurrences:   [phone]   Full code    Standing Status:   Standing    Number of Occurrences:   1    Order Specific Question:   By:    Answer:   Other   Consult to hospitalist    Standing Status:   Standing    Number of  Occurrences:   1    Order Specific Question:   Place call to:    Answer:   [id number]    Order Specific Question:   Reason for Consult    Answer:   Admit   Pharmacy Consult    Standing Status:   Standing    Number of Occurrences:   1    Order Specific Question:  Reason for Consult (*indicate specifics in comment field)    Answer:   Other (see comment)    Order Specific Question:   Comment:    Answer:   Treatment for SUSPECTED SEPSIS has been initiated. Follow patient to expedite delivery of core sepsis measures, specifically timely, antibiotic administration.   Pulse oximetry check with vital signs    Standing Status:   Standing    Number of Occurrences:   1   Pulse oximetry (single)    Standing Status:   Standing    Number of Occurrences:   [phone]   Oxygen therapy Keep O2 saturation between: >92%    Standing Status:   Standing    Number of Occurrences:   20    Order Specific Question:   Keep O2 saturation between    Answer:   >92%   Oxygen therapy Mode or (Route): Nasal cannula; Liters Per Minute: 2; Keep O2 saturation between: greater than 92 %    Standing Status:   Standing    Number of Occurrences:   1    Order Specific Question:   Mode or (Route)    Answer:   Nasal cannula    Order Specific Question:   Liters Per Minute    Answer:   2    Order Specific Question:   Keep O2 saturation between    Answer:   greater than 92 %   EKG 12-Lead    Standing Status:   Standing    Number of Occurrences:   1   EKG 12-Lead    Standing Status:   Standing    Number of Occurrences:   1   ED EKG 12-Lead    Standing Status:   Standing    Number of Occurrences:   1    Order Specific Question:   Notes    Answer:   Baseline   Type and screen    Standing Status:   Standing    Number of Occurrences:   1   Admit to Inpatient (patient's expected length of stay will be greater than 2 midnights or inpatient only procedure)    Standing Status:   Standing    Number of Occurrences:   1    Order  Specific Question:   Hospital Area    Answer:   [facility] [[id number]]    Order Specific Question:   Level of Care    Answer:   Progressive [102]    Order Specific Question:   Admit to Progressive based on following criteria    Answer:   RESPIRATORY PROBLEMS hypoxemic/hypercapnic respiratory failure that is responsive to NIPPV (BiPAP) or High Flow Nasal Cannula (6-80 lpm). Frequent assessment/intervention, no > Q2 hrs < Q4 hrs, to maintain oxygenation and pulmonary hygiene.    Order Specific Question:   Admit to Progressive based on following criteria    Answer:   COMPLICATED UROLOGY Patients requiring frequent assessments and interventions, such as continuous bladder irrigations, immediate post-op surgical procedures, i.e., bladder removal/ileal conduit.    Order Specific Question:   Covid Evaluation    Answer:   Asymptomatic - no recent exposure (last 10 days) testing not required    Order Specific Question:   Diagnosis    Answer:   AMS (altered mental status) [[id number]]    Order Specific Question:   Admitting Physician    Answer:   Darrold Junker    Order Specific Question:   Attending Physician    Answer:  Gertha Calkin [id number]    Order Specific Question:   Certification:    Answer:   I certify this patient will need inpatient services for at least 2 midnights    Order Specific Question:   Expected Medical Readiness    Answer:   12/18/2022   Admit to Inpatient (patient's expected length of stay will be greater than 2 midnights or inpatient only procedure)    Standing Status:   Standing    Number of Occurrences:   1    Order Specific Question:   Hospital Area    Answer:   [facility] [[id number]]    Order Specific Question:   Level of Care    Answer:   Stepdown [14]    Order Specific Question:   Covid Evaluation    Answer:   Asymptomatic - no recent exposure (last 10 days) testing not required    Order Specific Question:   Diagnosis     Answer:   AMS (altered mental status) [[id number]]    Order Specific Question:   Admitting Physician    Answer:   Darrold Junker    Order Specific Question:   Attending Physician    Answer:   Darrold Junker    Order Specific Question:   Certification:    Answer:   I certify this patient will need inpatient services for at least 2 midnights    Order Specific Question:   Expected Medical Readiness    Answer:   12/18/2022   Admit to Inpatient (patient's expected length of stay will be greater than 2 midnights or inpatient only procedure)    Standing Status:   Standing    Number of Occurrences:   1    Order Specific Question:   Hospital Area    Answer:   [facility] [[id number]]    Order Specific Question:   Level of Care    Answer:   Progressive [102]    Order Specific Question:   Admit to Progressive based on following criteria    Answer:   CARDIOVASCULAR & THORACIC of moderate stability with acute coronary syndrome symptoms/low risk myocardial infarction/hypertensive urgency/arrhythmias/heart failure potentially compromising stability and stable post cardiovascular intervention patients.    Order Specific Question:   Admit to Progressive based on following criteria    Answer:   MULTISYSTEM THREATS such as stable sepsis, metabolic/electrolyte imbalance with or without encephalopathy that is responding to early treatment.    Order Specific Question:   Covid Evaluation    Answer:   Asymptomatic - no recent exposure (last 10 days) testing not required    Order Specific Question:   Diagnosis    Answer:   AMS (altered mental status) [[id number]]    Order Specific Question:   Admitting Physician    Answer:   Darrold Junker    Order Specific Question:   Attending Physician    Answer:   Darrold Junker    Order Specific Question:   Certification:    Answer:   I certify this patient will need inpatient services for at least 2 midnights    Order Specific  Question:   Expected Medical Readiness    Answer:   12/18/2022   Fall precautions    Standing Status:   Standing    Number of Occurrences:   1

## 2022-12-16 NOTE — Consult Note (Signed)
CODE SEPSIS - PHARMACY COMMUNICATION  **Broad-spectrum antimicrobials should be administered within one hour of sepsis diagnosis**  Time Code Sepsis call or page was received: 1933  Antibiotics ordered: Ceftriaxone, Azithromycin  Time of first antibiotic administration: 1953  Additional action taken by pharmacy: N/A  If necessary, name of provider/nurse contacted: N/A    Will M. Dareen Piano, PharmD Clinical Pharmacist 12/16/2022 8:01 PM

## 2022-12-16 NOTE — ED Provider Notes (Signed)
[facility] Provider Note   Event Date/Time   First MD Initiated Contact with Patient 12/16/22 1846     (approximate) History  Altered Mental Status  HPI Christy [name] is a 67 y.o. female hypertension, hyperlipidemia, type 2 diabetes, and schizophrenia who presents via [facility] via EMS after she was noted to have altered mental status since yesterday.  They also found patient's temperature to be 102.  EMS placed patient on 4 L by nasal cannula for tachypnea but did not notice any desaturation.  Patient states she complains of lower abdominal pain.  No other complaints at this time ROS: Unable to assess   Physical Exam  Triage Vital Signs: ED Triage Vitals  Encounter Vitals Group     BP 12/16/22 1839 (!) 172/74     Systolic BP Percentile --      Diastolic BP Percentile --      Pulse Rate 12/16/22 1839 (!) 134     Resp 12/16/22 1839 (!) 50     Temp 12/16/22 1839 (!) 103.3 F (39.6 C)     Temp Source 12/16/22 1839 Oral     SpO2 12/16/22 1839 91 %     Weight --      Height --      Head Circumference --      Peak Flow --      Pain Score 12/16/22 1843 6     Pain Loc --      Pain Education --      Exclude from Growth Chart --    Most recent vital signs: Vitals:   12/16/22 2230 12/16/22 2300  BP: 122/60 120/67  Pulse: 100 95  Resp: (!) 23 12  Temp:    SpO2: 96% 96%   General: Awake, cooperative CV:  Good peripheral perfusion.  Resp:  Increased effort.  Clear to auscultation bilaterally Abd:  No distention.  Other:  Elderly obese African-American female resting comfortably in mild respiratory distress ED Results / Procedures / Treatments  Labs (all labs ordered are listed, but only abnormal results are displayed) Labs Reviewed  COMPREHENSIVE METABOLIC PANEL - Abnormal; Notable for the following components:      Result Value   Sodium 133 (*)    Potassium 3.2 (*)    Chloride 96 (*)    Glucose, Bld 279 (*)    Creatinine, Ser 1.01 (*)     Total Bilirubin 2.0 (*)    All other components within normal limits  LACTIC ACID, PLASMA - Abnormal; Notable for the following components:   Lactic Acid, Venous 2.3 (*)    All other components within normal limits  LACTIC ACID, PLASMA - Abnormal; Notable for the following components:   Lactic Acid, Venous 2.9 (*)    All other components within normal limits  CBC WITH DIFFERENTIAL/PLATELET - Abnormal; Notable for the following components:   WBC 26.6 (*)    RBC 5.14 (*)    Hemoglobin 11.7 (*)    MCV 71.2 (*)    MCH 22.8 (*)    RDW 15.9 (*)    Neutro Abs 22.5 (*)    Monocytes Absolute 2.7 (*)    Abs Immature Granulocytes 0.56 (*)    All other components within normal limits  PROTIME-INR - Abnormal; Notable for the following components:   Prothrombin Time 15.7 (*)    All other components within normal limits  URINALYSIS, W/ REFLEX TO CULTURE (INFECTION SUSPECTED) - Abnormal; Notable for the following components:   Color,  Urine YELLOW (*)    APPearance CLOUDY (*)    Glucose, UA 50 (*)    Hgb urine dipstick MODERATE (*)    Ketones, ur 5 (*)    Protein, ur 30 (*)    Nitrite POSITIVE (*)    Leukocytes,Ua LARGE (*)    Bacteria, UA RARE (*)    All other components within normal limits  RESP PANEL BY RT-PCR (RSV, FLU A&B, COVID)  RVPGX2  CULTURE, BLOOD (ROUTINE X 2)  CULTURE, BLOOD (ROUTINE X 2)  URINE CULTURE  HIV ANTIBODY (ROUTINE TESTING W REFLEX)  PROTIME-INR  CORTISOL-AM, BLOOD  PROCALCITONIN  CBC  CREATININE, SERUM   EKG ED ECG REPORT I, Merwyn Katos, the attending physician, personally viewed and interpreted this ECG. Date: 12/16/2022 EKG Time: 1852 Rate: 136 Rhythm: Tachycardic sinus rhythm QRS Axis: normal Intervals: normal ST/T Wave abnormalities: normal Narrative Interpretation: tachycardic sinus rhythm. no evidence of acute ischemia RADIOLOGY ED MD interpretation: One-view portable chest x-ray interpreted by me shows no evidence of acute abnormalities  including no pneumonia, pneumothorax, or widened mediastinum -Agree with radiology assessment Official radiology report(s): CT Angio Chest PE W/Cm &/Or Wo Cm  Result Date: 12/16/2022 CLINICAL DATA:  Fevers and possible sepsis, initial encounter EXAM: CT ANGIOGRAPHY CHEST WITH CONTRAST TECHNIQUE: Multidetector CT imaging of the chest was performed using the standard protocol during bolus administration of intravenous contrast. Multiplanar CT image reconstructions and MIPs were obtained to evaluate the vascular anatomy. RADIATION DOSE REDUCTION: This exam was performed according to the departmental dose-optimization program which includes automated exposure control, adjustment of the mA and/or kV according to patient size and/or use of iterative reconstruction technique. CONTRAST:  OMNIPAQUE IOHEXOL 350 MG/ML SOLN COMPARISON:  Chest x-ray from earlier in the same day. FINDINGS: Cardiovascular: Thoracic aorta is within normal limits. No cardiac enlargement is noted. The pulmonary artery shows a normal branching pattern bilaterally. No definitive filling defect to suggest pulmonary embolism is noted. No significant coronary calcifications are noted. Mediastinum/Nodes: Thoracic inlet is within normal limits. No hilar or mediastinal adenopathy is noted. The esophagus as visualized is within normal limits. Lungs/Pleura: Lungs are well aerated bilaterally. No focal infiltrate or effusion is seen. Upper Abdomen: Visualized upper abdomen is unremarkable. Musculoskeletal: Bony structures appear within normal limits. Review of the MIP images confirms the above findings. IMPRESSION: No evidence of pulmonary emboli. No acute abnormality seen. Electronically Signed   By: Alcide Clever M.D.   On: 12/16/2022 21:50   DG Chest 1 View  Result Date: 12/16/2022 CLINICAL DATA:  Sepsis, altered mental status EXAM: CHEST  1 VIEW COMPARISON:  07/20/2022 FINDINGS: Lungs are clear. Heart size and mediastinal contours are within  normal limits. No effusion. Visualized bones unremarkable. IMPRESSION: No acute cardiopulmonary disease. Electronically Signed   By: Corlis Leak M.D.   On: 12/16/2022 19:56   PROCEDURES: Critical Care performed: Yes, see critical care procedure note(s) .1-3 Lead EKG Interpretation  Performed by: Merwyn Katos, MD Authorized by: Merwyn Katos, MD     Interpretation: normal     ECG rate:  91   ECG rate assessment: normal     Rhythm: sinus rhythm     Ectopy: none     Conduction: normal   CRITICAL CARE Performed by: Merwyn Katos  Total critical care time: 35 minutes  Critical care time was exclusive of separately billable procedures and treating other patients.  Critical care was necessary to treat or prevent imminent or life-threatening deterioration.  Critical care  was time spent personally by me on the following activities: development of treatment plan with patient and/or surrogate as well as nursing, discussions with consultants, evaluation of patient's response to treatment, examination of patient, obtaining history from patient or surrogate, ordering and performing treatments and interventions, ordering and review of laboratory studies, ordering and review of radiographic studies, pulse oximetry and re-evaluation of patient's condition.  MEDICATIONS ORDERED IN ED: Medications  lactated ringers infusion ( Intravenous New Bag/Given 12/16/22 1951)  cefTRIAXone (ROCEPHIN) 2 g in sodium chloride 0.9 % 100 mL IVPB (0 g Intravenous Stopped 12/16/22 2040)  azithromycin (ZITHROMAX) 500 mg in sodium chloride 0.9 % 250 mL IVPB (0 mg Intravenous Stopped 12/16/22 2040)  loratadine (CLARITIN) tablet 10 mg (has no administration in time range)  metoprolol succinate (TOPROL-XL) 24 hr tablet 12.5 mg (has no administration in time range)  ondansetron (ZOFRAN-ODT) disintegrating tablet 4 mg (has no administration in time range)  aspirin EC tablet 81 mg (has no administration in time range)   oxybutynin (DITROPAN) tablet 5 mg (has no administration in time range)  olopatadine (PATANOL) 0.1 % ophthalmic solution 1 drop (has no administration in time range)  pantoprazole (PROTONIX) EC tablet 40 mg (has no administration in time range)  trihexyphenidyl (ARTANE) tablet 2 mg (has no administration in time range)  cyanocobalamin (VITAMIN B12) tablet 1,000 mcg (has no administration in time range)  potassium chloride (KLOR-CON) CR tablet 10 mEq (has no administration in time range)  sodium chloride flush (NS) 0.9 % injection 3 mL (3 mLs Intravenous Given 12/16/22 2300)  lactated ringers infusion (0 mL/hr Intravenous Stopped 12/16/22 2302)  heparin injection 5,000 Units (5,000 Units Subcutaneous Given 12/16/22 2301)  lactated ringers bolus 1,000 mL (0 mLs Intravenous Stopped 12/16/22 2133)  acetaminophen (TYLENOL) tablet 1,000 mg (1,000 mg Oral Given 12/16/22 1953)  iohexol (OMNIPAQUE) 350 MG/ML injection 100 mL (100 mLs Intravenous Contrast Given 12/16/22 2140)   IMPRESSION / MDM / ASSESSMENT AND PLAN / ED COURSE  I reviewed the triage vital signs and the nursing notes.                             The patient is on the cardiac monitor to evaluate for evidence of arrhythmia and/or significant heart rate changes. Patient's presentation is most consistent with acute presentation with potential threat to life or bodily function. The Pt presents with altered mental status highly concerning for sepsis (suspected urinary versus pulmonary source). At this time, the Pt is satting well on 3 L, normotensive, and tach cardiac.  Will start empiric antibiotics and fluids.  Will administer fluids gradually with frequent reassessment. Have low suspicion for a GI, skin/soft tissue, or CNS source at this time, but will reconsider if initial workup is unremarkable.  - CBC, BMP, LFTs - UA - BCx x2, Lactate - EKG - CXR - Empiric Abx: Rocephin/azithromycin - Fluids: 1 L LR  UA showing positive nitrites  large leukocytes and bacteria concerning for urosepsis  Dispo: Admit to medicine   FINAL CLINICAL IMPRESSION(S) / ED DIAGNOSES   Final diagnoses:  Sepsis with encephalopathy without septic shock, due to unspecified organism [facility])  Urinary tract infection with hematuria, site unspecified   Rx / DC Orders   ED Discharge Orders     None      Note:  This document was prepared using Dragon voice recognition software and may include unintentional dictation errors.   Merwyn Katos, MD 12/16/22 [phone]

## 2022-12-16 NOTE — ED Triage Notes (Signed)
Pt arrived via EMS from [facility]. Pt exhibited altered mental status since yesterday per [facility] staff. Pt temp was 102 yesterday. Came in on 4L with stats at 94%. Pt wears CPAP at night. Pt complain of stomach pain.

## 2022-12-17 ENCOUNTER — Other Ambulatory Visit: Payer: Self-pay

## 2022-12-17 ENCOUNTER — Encounter: Payer: Self-pay | Admitting: Internal Medicine

## 2022-12-17 DIAGNOSIS — R6520 Severe sepsis without septic shock: Secondary | ICD-10-CM | POA: Diagnosis not present

## 2022-12-17 DIAGNOSIS — D649 Anemia, unspecified: Secondary | ICD-10-CM | POA: Diagnosis present

## 2022-12-17 DIAGNOSIS — A419 Sepsis, unspecified organism: Secondary | ICD-10-CM | POA: Diagnosis present

## 2022-12-17 DIAGNOSIS — N390 Urinary tract infection, site not specified: Secondary | ICD-10-CM | POA: Diagnosis present

## 2022-12-17 DIAGNOSIS — N179 Acute kidney failure, unspecified: Secondary | ICD-10-CM | POA: Diagnosis present

## 2022-12-17 DIAGNOSIS — E878 Other disorders of electrolyte and fluid balance, not elsewhere classified: Secondary | ICD-10-CM | POA: Diagnosis present

## 2022-12-17 LAB — BLOOD CULTURE ID PANEL (REFLEXED) - BCID2

## 2022-12-17 LAB — CBG MONITORING, ED
Glucose-Capillary: 218 mg/dL — ABNORMAL HIGH (ref 70–99)
Glucose-Capillary: 209 mg/dL — ABNORMAL HIGH (ref 70–99)
Glucose-Capillary: 214 mg/dL — ABNORMAL HIGH (ref 70–99)

## 2022-12-17 LAB — PROCALCITONIN: Procalcitonin: 37.64 ng/mL

## 2022-12-17 LAB — TYPE AND SCREEN
ABO/RH(D): O POS
Antibody Screen: NEGATIVE

## 2022-12-17 LAB — COMPREHENSIVE METABOLIC PANEL
ALT: 14 U/L (ref 0–44)
AST: 22 U/L (ref 15–41)
Albumin: 2.9 g/dL — ABNORMAL LOW (ref 3.5–5.0)
Alkaline Phosphatase: 71 U/L (ref 38–126)
Anion gap: 7 (ref 5–15)
BUN: 11 mg/dL (ref 8–23)
CO2: 27 mmol/L (ref 22–32)
Calcium: 8.2 mg/dL — ABNORMAL LOW (ref 8.9–10.3)
Chloride: 104 mmol/L (ref 98–111)
Creatinine, Ser: 0.98 mg/dL (ref 0.44–1.00)
GFR, Estimated: >60 mL/min (ref >60)
Glucose, Bld: 190 mg/dL — ABNORMAL HIGH (ref 70–99)
Potassium: 3.1 mmol/L — ABNORMAL LOW (ref 3.5–5.1)
Sodium: 138 mmol/L (ref 135–145)
Total Bilirubin: 0.9 mg/dL (ref <1.2)
Total Protein: 6 g/dL — ABNORMAL LOW (ref 6.5–8.1)

## 2022-12-17 LAB — CBC
HCT: 31.7 % — ABNORMAL LOW (ref 36.0–46.0)
Hemoglobin: 10.3 g/dL — ABNORMAL LOW (ref 12.0–15.0)
MCH: 23.1 pg — ABNORMAL LOW (ref 26.0–34.0)
MCHC: 32.5 g/dL (ref 30.0–36.0)
MCV: 71.2 fL — ABNORMAL LOW (ref 80.0–100.0)
Platelets: 167 10*3/uL (ref 150–400)
RBC: 4.45 MIL/uL (ref 3.87–5.11)
RDW: 15.6 % — ABNORMAL HIGH (ref 11.5–15.5)
WBC: 26.7 10*3/uL — ABNORMAL HIGH (ref 4.0–10.5)
nRBC: 0 % (ref 0.0–0.2)

## 2022-12-17 LAB — HEMOGLOBIN A1C
Hgb A1c MFr Bld: 7.1 % — ABNORMAL HIGH (ref 4.8–5.6)
Mean Plasma Glucose: 157.07 mg/dL

## 2022-12-17 LAB — MAGNESIUM: Magnesium: 1.7 mg/dL (ref 1.7–2.4)

## 2022-12-17 LAB — CORTISOL-AM, BLOOD: Cortisol - AM: 32 ug/dL — ABNORMAL HIGH (ref 6.7–22.6)

## 2022-12-17 LAB — PHOSPHORUS: Phosphorus: 3.2 mg/dL (ref 2.5–4.6)

## 2022-12-17 LAB — PROTIME-INR
INR: 1.4 — ABNORMAL HIGH (ref 0.8–1.2)
Prothrombin Time: 17.3 s — ABNORMAL HIGH (ref 11.4–15.2)

## 2022-12-17 LAB — HIV ANTIBODY (ROUTINE TESTING W REFLEX): HIV Screen 4th Generation wRfx: NONREACTIVE

## 2022-12-17 MED ORDER — PANTOPRAZOLE SODIUM 40 MG IV SOLR
40.0000 mg | Freq: Two times a day (BID) | INTRAVENOUS | Status: DC
  Administered 2022-12-17 – 2022-12-19 (×7): 40 mg via INTRAVENOUS
  Filled 2022-12-17 (×8): qty 10

## 2022-12-17 MED ORDER — POTASSIUM CHLORIDE CRYS ER 20 MEQ PO TBCR
30.0000 meq | EXTENDED_RELEASE_TABLET | Freq: Every day | ORAL | Status: AC
  Administered 2022-12-17 – 2022-12-18 (×2): 30 meq via ORAL
  Filled 2022-12-17: qty 2
  Filled 2022-12-17: qty 1

## 2022-12-17 MED ORDER — ACETAMINOPHEN 10 MG/ML IV SOLN
1000.0000 mg | Freq: Four times a day (QID) | INTRAVENOUS | Status: AC | PRN
  Administered 2022-12-17: 1000 mg via INTRAVENOUS

## 2022-12-17 MED ORDER — INSULIN ASPART 100 UNIT/ML IJ SOLN
0.0000 [IU] | Freq: Three times a day (TID) | INTRAMUSCULAR | Status: DC
Start: 2022-12-17 — End: 2022-12-18
  Administered 2022-12-17: 3 [IU] via SUBCUTANEOUS
  Administered 2022-12-18: 2 [IU] via SUBCUTANEOUS
  Filled 2022-12-17 (×2): qty 1

## 2022-12-17 MED ORDER — ACETAMINOPHEN 500 MG PO TABS
1000.0000 mg | ORAL_TABLET | Freq: Four times a day (QID) | ORAL | Status: DC | PRN
  Administered 2022-12-17 – 2022-12-18 (×2): 1000 mg via ORAL
  Filled 2022-12-17 (×3): qty 2

## 2022-12-17 MED ORDER — NICOTINE 21 MG/24HR TD PT24
21.0000 mg | MEDICATED_PATCH | Freq: Every day | TRANSDERMAL | Status: DC
  Filled 2022-12-17 (×4): qty 1

## 2022-12-17 NOTE — ED Notes (Addendum)
Jon Billings, NP notified of temp of 102, tylenol order requested.

## 2022-12-17 NOTE — Assessment & Plan Note (Addendum)
    Latest Ref Rng & Units 12/16/2022   11:50 PM 12/16/2022    6:47 PM 07/20/2022   12:14 PM  CBC  WBC 4.0 - 10.5 K/uL 26.7  26.6  7.7   Hemoglobin 12.0 - 15.0 g/dL 60.4  54.0  98.1   Hematocrit 36.0 - 46.0 % 31.7  36.6  39.8   Platelets 150 - 400 K/uL 167  189  253   Is new since June of this year. Will obtain stool occult. IV PPI therapy. Type and screen. Additional evaluation based on results.

## 2022-12-17 NOTE — Assessment & Plan Note (Addendum)
Will get pharmacy on board for trend management and correction.

## 2022-12-17 NOTE — ED Notes (Signed)
Lab notified and to draw blood

## 2022-12-17 NOTE — ED Notes (Signed)
Chux pads saturated with urine. Perineal care provided and clean, dry chux placed under pt.

## 2022-12-17 NOTE — ED Notes (Signed)
20 gauge in Left forearm cleaned w/ chlorhexidine and dressing replaced. Pt given ice water per request.

## 2022-12-17 NOTE — Progress Notes (Addendum)
Progress Note    Christy [name]  WUJ:[id number] DOB: [birth year]  DOA: 12/16/2022 PCP: Ellan Lambert, NP      Brief Narrative:    Medical records reviewed and are as summarized below:  Christy [name] is a 67 y.o. 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 In the ED, she was febrile with a temperature of 103.3 F, tachycardic with heart rate of 137, tachypnea with heart rate in the 30s but she was hypertensive.  Initial lactic acid was 2.3 and it went up to 2.9.  WBC was 26.6.  Procalcitonin was 37.64.  She was found to have severe sepsis secondary to acute UTI and E. coli bacteremia.      Assessment/Plan:   Principal Problem:   Severe sepsis (HCC) Active Problems:   Acute metabolic encephalopathy   Urinary tract infection   Current tobacco use   Hypertension   Controlled type 2 diabetes mellitus without complication, without long-term current use of insulin (HCC)   Anemia   Electrolyte abnormality   AKI (acute kidney injury) (HCC)   Body mass index is 43.07 kg/m.  (Morbid obesity)    Severe sepsis secondary to acute UTI and E. coli bacteremia: Continue IV ceftriaxone.  Discontinue IV azithromycin.  Follow-up blood culture sensitivity report and urine culture report.   Hypokalemia: Replete potassium and monitor levels.   AKI: Improved   Acute metabolic encephalopathy: Improved   Type 2 diabetes mellitus: Glimepiride and metformin have been held.  NovoLog as needed for hyperglycemia.   Hypertension: Continue Toprol-XL.  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      Diet Order             DIET DYS 2 Room service appropriate? Yes; Fluid consistency: Thin  Diet effective now                            Consultants: None  Procedures: None    Medications:    aspirin EC  81 mg Oral Daily   cyanocobalamin  1,000 mcg Oral Daily   heparin  5,000 Units Subcutaneous Q8H   insulin aspart  0-9 Units Subcutaneous TID WC   loratadine  10 mg Oral Daily   nicotine  21 mg Transdermal Daily   oxybutynin  5 mg Oral TID   pantoprazole (PROTONIX) IV  40 mg Intravenous Q12H   potassium chloride  30 mEq Oral Daily   sodium chloride flush  3 mL Intravenous Q12H   trihexyphenidyl  2 mg Oral Daily   Continuous Infusions:  cefTRIAXone (ROCEPHIN)  IV Stopped (12/16/22 2040)   lactated ringers 150 mL/hr at 12/17/22 1205     Anti-infectives (From admission, onward)    Start     Dose/Rate Route Frequency Ordered Stop   12/16/22 1945  cefTRIAXone (ROCEPHIN) 2 g in sodium chloride 0.9 %  100 mL IVPB        2 g 200 mL/hr over 30 Minutes Intravenous Every 24 hours 12/16/22 1933 12/21/22 1944   12/16/22 1945  azithromycin (ZITHROMAX) 500 mg in sodium chloride 0.9 % 250 mL IVPB  Status:  Discontinued        500 mg 250 mL/hr over 60 Minutes Intravenous Every 24 hours 12/16/22 1933 12/17/22 0915              Family Communication/Anticipated D/C date and plan/Code Status   DVT prophylaxis: heparin injection 5,000 Units Start: 12/16/22 2215     Code Status: Full Code  Family Communication: None Disposition Plan: Plan to discharge to ALF   Status is: Inpatient Remains inpatient  appropriate because: Severe sepsis       Subjective:   Interval events noted.  She had fever with Tmax of 101.4 this morning.  She does not report any complaints.  Objective:    Vitals:   12/17/22 0600 12/17/22 0806 12/17/22 0827 12/17/22 0946  BP:  108/83  (!) 118/55  Pulse:  (!) 115 (!) 110 100  Resp:  (!) 24  20  Temp: (!) 101.4 F (38.6 C)   98 F (36.7 C)  TempSrc: Oral   Oral  SpO2: 97% 98%  97%  Height:  5\' 7"  (1.702 m)     No data found.  No intake or output data in the 24 hours ending 12/17/22 1245 There were no vitals filed for this visit.  Exam:  GEN: NAD SKIN: No rash EYES: EOMI ENT: MMM CV: RRR PULM: CTA B ABD: soft, obese, NT, +BS CNS: AAO x 3, expressive dysphasia, non focal, resting tremors of b/l hands  EXT: No edema or tenderness       Data Reviewed:   I have personally reviewed following labs and imaging studies:  Labs: Labs show the following:   Basic Metabolic Panel: Recent Labs  Lab 12/16/22 1847 12/16/22 2302 12/17/22 1200  NA 133*  --  138  K 3.2*  --  3.1*  CL 96*  --  104  CO2 23  --  27  GLUCOSE 279*  --  190*  BUN 13  --  11  CREATININE 1.01* 1.28* 0.98  CALCIUM 9.4  --  8.2*  PHOS  --   --  3.2   GFR CrCl cannot be calculated (Unknown ideal weight.). Liver Function Tests: Recent Labs  Lab 12/16/22 1847 12/17/22 1200  AST 24 22  ALT 17 14  ALKPHOS 79 71  BILITOT 2.0* 0.9  PROT 7.8 6.0*  ALBUMIN 3.9 2.9*   No results for input(s): "LIPASE", "AMYLASE" in the last 168 hours. No results for input(s): "AMMONIA" in the last 168 hours. Coagulation profile Recent Labs  Lab 12/16/22 1847 12/17/22 0714  INR 1.2 1.4*    CBC: Recent Labs  Lab 12/16/22 1847 12/16/22 2350  WBC 26.6* 26.7*  NEUTROABS 22.5*  --   HGB 11.7* 10.3*  HCT 36.6 31.7*  MCV 71.2* 71.2*  PLT 189 167   Cardiac Enzymes: No results for input(s): "CKTOTAL", "CKMB", "CKMBINDEX", "TROPONINI" in the last 168 hours. BNP (last 3  results) No results for input(s): "PROBNP" in the last 8760 hours. CBG: Recent Labs  Lab 12/17/22 1010  GLUCAP 218*   D-Dimer: No results for input(s): "DDIMER" in the last 72 hours. Hgb A1c: No results for input(s): "HGBA1C" in the last 72 hours. Lipid Profile: No results for input(s): "CHOL", "HDL", "LDLCALC", "TRIG", "CHOLHDL", "LDLDIRECT"  in the last 72 hours. Thyroid function studies: No results for input(s): "TSH", "T4TOTAL", "T3FREE", "THYROIDAB" in the last 72 hours.  Invalid input(s): "FREET3" Anemia work up: No results for input(s): "VITAMINB12", "FOLATE", "FERRITIN", "TIBC", "IRON", "RETICCTPCT" in the last 72 hours. Sepsis Labs: Recent Labs  Lab 12/16/22 1847 12/16/22 2100 12/16/22 2350 12/17/22 0714  PROCALCITON  --   --   --  37.64  WBC 26.6*  --  26.7*  --   LATICACIDVEN 2.3* 2.9*  --   --     Microbiology Recent Results (from the past 240 hour(s))  Resp panel by RT-PCR (RSV, Flu A&B, Covid) Anterior Nasal Swab     Status: None   Collection Time: 12/16/22  6:47 PM   Specimen: Anterior Nasal Swab  Result Value Ref Range Status   SARS Coronavirus 2 by RT PCR NEGATIVE NEGATIVE Final    Comment: (NOTE) SARS-CoV-2 target nucleic acids are NOT DETECTED.  The SARS-CoV-2 RNA is generally detectable in upper respiratory specimens during the acute phase of infection. The lowest concentration of SARS-CoV-2 viral copies this assay can detect is 138 copies/mL. A negative result does not preclude SARS-Cov-2 infection and should not be used as the sole basis for treatment or other patient management decisions. A negative result may occur with  improper specimen collection/handling, submission of specimen other than nasopharyngeal swab, presence of viral mutation(s) within the areas targeted by this assay, and inadequate number of viral copies(<138 copies/mL). A negative result must be combined with clinical observations, patient history, and  epidemiological information. The expected result is Negative.  Fact Sheet for Patients:  BloggerCourse.com  Fact Sheet for Healthcare Providers:  SeriousBroker.it  This test is no t yet approved or cleared by the [place] FDA and  has been authorized for detection and/or diagnosis of SARS-CoV-2 by FDA under an Emergency Use Authorization (EUA). This EUA will remain  in effect (meaning this test can be used) for the duration of the COVID-19 declaration under Section 564(b)(1) of the Act, 21 U.S.C.section 360bbb-3(b)(1), unless the authorization is terminated  or revoked sooner.       Influenza A by PCR NEGATIVE NEGATIVE Final   Influenza B by PCR NEGATIVE NEGATIVE Final    Comment: (NOTE) The Xpert Xpress SARS-CoV-2/FLU/RSV plus assay is intended as an aid in the diagnosis of influenza from Nasopharyngeal swab specimens and should not be used as a sole basis for treatment. Nasal washings and aspirates are unacceptable for Xpert Xpress SARS-CoV-2/FLU/RSV testing.  Fact Sheet for Patients: BloggerCourse.com  Fact Sheet for Healthcare Providers: SeriousBroker.it  This test is not yet approved or cleared by the [place] FDA and has been authorized for detection and/or diagnosis of SARS-CoV-2 by FDA under an Emergency Use Authorization (EUA). This EUA will remain in effect (meaning this test can be used) for the duration of the COVID-19 declaration under Section 564(b)(1) of the Act, 21 U.S.C. section 360bbb-3(b)(1), unless the authorization is terminated or revoked.     Resp Syncytial Virus by PCR NEGATIVE NEGATIVE Final    Comment: (NOTE) Fact Sheet for Patients: BloggerCourse.com  Fact Sheet for Healthcare Providers: SeriousBroker.it  This test is not yet approved or cleared by the [place] FDA and has been  authorized for detection and/or diagnosis of SARS-CoV-2 by FDA under an Emergency Use Authorization (EUA). This EUA will remain in effect (meaning this test can be used) for the duration of the COVID-19 declaration under Section 564(b)(1) of the Act, 21 U.S.C. section 360bbb-3(b)(1), unless  the authorization is terminated or revoked.  Performed at [facility], [address] [place], [place] [address]   Culture, blood (Routine x 2)     Status: None (Preliminary result)   Collection Time: 12/16/22  6:59 PM   Specimen: BLOOD  Result Value Ref Range Status   Specimen Description BLOOD BLOOD LEFT WRIST  Final   Special Requests   Final    BOTTLES DRAWN AEROBIC AND ANAEROBIC Blood Culture adequate volume   Culture  Setup Time   Final    GRAM NEGATIVE RODS IN BOTH AEROBIC AND ANAEROBIC BOTTLES CRITICAL RESULT CALLED TO, READ BACK BY AND VERIFIED WITH: EMILY STEINBOCK AT 2536 12/17/22.PMF Performed at [facility], [address] [place], [place] [address]    Culture GRAM NEGATIVE RODS  Final   Report Status PENDING  Incomplete  Culture, blood (Routine x 2)     Status: None (Preliminary result)   Collection Time: 12/16/22  7:04 PM   Specimen: BLOOD  Result Value Ref Range Status   Specimen Description BLOOD LEFT ANTECUBITAL  Final   Special Requests   Final    BOTTLES DRAWN AEROBIC AND ANAEROBIC Blood Culture results may not be optimal due to an excessive volume of blood received in culture bottles   Culture  Setup Time   Final    Organism ID to follow GRAM NEGATIVE RODS IN BOTH AEROBIC AND ANAEROBIC BOTTLES CRITICAL RESULT CALLED TO, READ BACK BY AND VERIFIED WITH: EMILY STEINBOCK AT 4742 12/17/22.PMF Performed at [facility], [address] [place], [place] [address]    Culture GRAM NEGATIVE RODS  Final   Report Status PENDING  Incomplete  Blood Culture ID Panel (Reflexed)     Status: Abnormal   Collection Time: 12/16/22  7:04 PM  Result Value  Ref Range Status   Enterococcus faecalis NOT DETECTED NOT DETECTED Final   Enterococcus Faecium NOT DETECTED NOT DETECTED Final   Listeria monocytogenes NOT DETECTED NOT DETECTED Final   Staphylococcus species NOT DETECTED NOT DETECTED Final   Staphylococcus aureus (BCID) NOT DETECTED NOT DETECTED Final   Staphylococcus epidermidis NOT DETECTED NOT DETECTED Final   Staphylococcus lugdunensis NOT DETECTED NOT DETECTED Final   Streptococcus species NOT DETECTED NOT DETECTED Final   Streptococcus agalactiae NOT DETECTED NOT DETECTED Final   Streptococcus pneumoniae NOT DETECTED NOT DETECTED Final   Streptococcus pyogenes NOT DETECTED NOT DETECTED Final   A.calcoaceticus-baumannii NOT DETECTED NOT DETECTED Final   Bacteroides fragilis NOT DETECTED NOT DETECTED Final   Enterobacterales DETECTED (A) NOT DETECTED Final    Comment: Enterobacterales represent a large order of gram negative bacteria, not a single organism. CRITICAL RESULT CALLED TO, READ BACK BY AND VERIFIED WITH: EMILY STEINBOCK AT 8756 12/17/22.PMF    Enterobacter cloacae complex NOT DETECTED NOT DETECTED Final   Escherichia coli DETECTED (A) NOT DETECTED Final    Comment: CRITICAL RESULT CALLED TO, READ BACK BY AND VERIFIED WITH: EMILY STEINBOCK AT 4332 12/17/22.PMF    Klebsiella aerogenes NOT DETECTED NOT DETECTED Final   Klebsiella oxytoca NOT DETECTED NOT DETECTED Final   Klebsiella pneumoniae NOT DETECTED NOT DETECTED Final   Proteus species NOT DETECTED NOT DETECTED Final   Salmonella species NOT DETECTED NOT DETECTED Final   Serratia marcescens NOT DETECTED NOT DETECTED Final   Haemophilus influenzae NOT DETECTED NOT DETECTED Final   Neisseria meningitidis NOT DETECTED NOT DETECTED Final   Pseudomonas aeruginosa NOT DETECTED NOT DETECTED Final   Stenotrophomonas maltophilia NOT DETECTED NOT DETECTED  Final   Candida albicans NOT DETECTED NOT DETECTED Final   Candida auris NOT DETECTED NOT DETECTED Final   Candida  glabrata NOT DETECTED NOT DETECTED Final   Candida krusei NOT DETECTED NOT DETECTED Final   Candida parapsilosis NOT DETECTED NOT DETECTED Final   Candida tropicalis NOT DETECTED NOT DETECTED Final   Cryptococcus neoformans/gattii NOT DETECTED NOT DETECTED Final   CTX-M ESBL NOT DETECTED NOT DETECTED Final   Carbapenem resistance IMP NOT DETECTED NOT DETECTED Final   Carbapenem resistance KPC NOT DETECTED NOT DETECTED Final   Carbapenem resistance NDM NOT DETECTED NOT DETECTED Final   Carbapenem resist OXA 48 LIKE NOT DETECTED NOT DETECTED Final   Carbapenem resistance VIM NOT DETECTED NOT DETECTED Final    Comment: Performed at [facility], [address] [place], [place] [address]    Procedures and diagnostic studies:  CT Angio Chest PE W/Cm &/Or Wo Cm  Result Date: 12/16/2022 CLINICAL DATA:  Fevers and possible sepsis, initial encounter EXAM: CT ANGIOGRAPHY CHEST WITH CONTRAST TECHNIQUE: Multidetector CT imaging of the chest was performed using the standard protocol during bolus administration of intravenous contrast. Multiplanar CT image reconstructions and MIPs were obtained to evaluate the vascular anatomy. RADIATION DOSE REDUCTION: This exam was performed according to the departmental dose-optimization program which includes automated exposure control, adjustment of the mA and/or kV according to patient size and/or use of iterative reconstruction technique. CONTRAST:  OMNIPAQUE IOHEXOL 350 MG/ML SOLN COMPARISON:  Chest x-ray from earlier in the same day. FINDINGS: Cardiovascular: Thoracic aorta is within normal limits. No cardiac enlargement is noted. The pulmonary artery shows a normal branching pattern bilaterally. No definitive filling defect to suggest pulmonary embolism is noted. No significant coronary calcifications are noted. Mediastinum/Nodes: Thoracic inlet is within normal limits. No hilar or mediastinal adenopathy is noted. The esophagus as visualized is within  normal limits. Lungs/Pleura: Lungs are well aerated bilaterally. No focal infiltrate or effusion is seen. Upper Abdomen: Visualized upper abdomen is unremarkable. Musculoskeletal: Bony structures appear within normal limits. Review of the MIP images confirms the above findings. IMPRESSION: No evidence of pulmonary emboli. No acute abnormality seen. Electronically Signed   By: Alcide Clever M.D.   On: 12/16/2022 21:50   DG Chest 1 View  Result Date: 12/16/2022 CLINICAL DATA:  Sepsis, altered mental status EXAM: CHEST  1 VIEW COMPARISON:  07/20/2022 FINDINGS: Lungs are clear. Heart size and mediastinal contours are within normal limits. No effusion. Visualized bones unremarkable. IMPRESSION: No acute cardiopulmonary disease. Electronically Signed   By: Corlis Leak M.D.   On: 12/16/2022 19:56               LOS: 1 day   Karinne Schmader  [facility]   Pager on [email or web address]. If 7PM-7AM, please contact night-coverage at [email or web address]     12/17/2022, 12:45 PM

## 2022-12-17 NOTE — Assessment & Plan Note (Addendum)
Patient during  my assessment at bedside is awake alert oriented, has baseline tremors at rest.  No documented history of Parkinson's.  Exam is nonfocal otherwise patient is weak at baseline.  Follows commands and is cooperative.

## 2022-12-17 NOTE — Assessment & Plan Note (Addendum)
Patient currently continued on Rocephin and azithromycin as initiated in the emergency room.

## 2022-12-17 NOTE — Assessment & Plan Note (Addendum)
Patient meets severe sepsis criteria with vitals white count source of infection being her urine lactic acidosis. CBC    Component Value Date/Time   WBC 26.7 (H) 12/16/2022 2350   RBC 4.45 12/16/2022 2350   HGB 10.3 (L) 12/16/2022 2350   HGB 13.1 07/25/2018 1157   HCT 31.7 (L) 12/16/2022 2350   HCT 41.6 07/25/2018 1157   PLT 167 12/16/2022 2350   PLT 315 07/25/2018 1157   MCV 71.2 (L) 12/16/2022 2350   MCV 70 (L) 07/25/2018 1157   MCV 68 (L) 03/16/2013 1256   MCH 23.1 (L) 12/16/2022 2350   MCHC 32.5 12/16/2022 2350   RDW 15.6 (H) 12/16/2022 2350   RDW 16.2 (H) 07/25/2018 1157   RDW 17.1 (H) 03/16/2013 1256   LYMPHSABS 0.7 12/16/2022 1847   LYMPHSABS 1.8 07/25/2018 1157   MONOABS 2.7 (H) 12/16/2022 1847   EOSABS 0.0 12/16/2022 1847   EOSABS 0.4 07/25/2018 1157   BASOSABS 0.1 12/16/2022 1847   BASOSABS 0.1 07/25/2018 1157    12/16/22 18:47 12/16/22 21:00  Lactic Acid, Venous 2.3 (HH) 2.9 (HH)    azithromycin Stopped (12/16/22 2040)   cefTRIAXone (ROCEPHIN)  IV Stopped (12/16/22 2040)   lactated ringers 150 mL/hr at 12/16/22 1951   No intake or output data in the 24 hours ending 12/17/22 1610

## 2022-12-17 NOTE — ED Notes (Signed)
Pt taking po ice water well and watching TV. Denies any complaints and remains pleasant.

## 2022-12-17 NOTE — Assessment & Plan Note (Addendum)
Vitals:   12/16/22 1839 12/16/22 2040 12/16/22 2100 12/16/22 2130  BP: (!) 172/74 133/81 135/65 124/64   12/16/22 2200 12/16/22 2230 12/16/22 2300 12/17/22 0000  BP: (!) 118/57 122/60 120/67 113/61   12/17/22 0030 12/17/22 0100 12/17/22 0130  BP: (!) 124/57 (!) 130/59 114/61  Blood pressure has been fluctuating patient did have hypotension at 1 point where her systolic was in the 80s, but that was transient and brief and based on repeat vitals is resolved. Will therefore hold patient's diuretic therapy metoprolol. Will also hold patient's Cozaar. As needed hydralazine to be ordered if indicated as needed.

## 2022-12-17 NOTE — ED Notes (Signed)
Pt brief cleaned of urine. New brief, chux, gown, blankets, and pillowcase applied. Hospital socks and sacral Mepilex applied. Perineum intact, skin on sacrum, back, heels, is warm, dry, intact at this time. Purewick reapplied, suction confirmed on .

## 2022-12-17 NOTE — Assessment & Plan Note (Addendum)
Nicotine patch. Counseling once patient is stable.

## 2022-12-17 NOTE — Assessment & Plan Note (Addendum)
Will currently hold patient's home regimen of Amaryl Cozaar and metformin to prevent hypoglycemia. Patient will be managed on glycemic protocol .  Last A1c 4 years ago based on our documentation was 8 we will repeat an A1c.

## 2022-12-17 NOTE — Progress Notes (Signed)
PHARMACY - PHYSICIAN COMMUNICATION CRITICAL VALUE ALERT - BLOOD CULTURE IDENTIFICATION (BCID)  Christy [name] is an 67 y.o. female who presented to [facility] on 12/16/2022 with a chief complaint of fever and altered mental status  Assessment: 11/21 blood culture with GNR in both sets, BCID detects E coli (no resistance).  Possible source is UTI  Name of physician (or Provider) Contacted: Dr Myriam Forehand  Current antibiotics: ceftriaxone/azithromycin  Changes to prescribed antibiotics recommended:  Patient is on recommended antibiotics - No changes needed (? Stop azithromycin)  Results for orders placed or performed during the hospital encounter of 12/16/22  Blood Culture ID Panel (Reflexed) (Collected: 12/16/2022  7:04 PM)  Result Value Ref Range   Enterococcus faecalis NOT DETECTED NOT DETECTED   Enterococcus Faecium NOT DETECTED NOT DETECTED   Listeria monocytogenes NOT DETECTED NOT DETECTED   Staphylococcus species NOT DETECTED NOT DETECTED   Staphylococcus aureus (BCID) NOT DETECTED NOT DETECTED   Staphylococcus epidermidis NOT DETECTED NOT DETECTED   Staphylococcus lugdunensis NOT DETECTED NOT DETECTED   Streptococcus species NOT DETECTED NOT DETECTED   Streptococcus agalactiae NOT DETECTED NOT DETECTED   Streptococcus pneumoniae NOT DETECTED NOT DETECTED   Streptococcus pyogenes NOT DETECTED NOT DETECTED   A.calcoaceticus-baumannii NOT DETECTED NOT DETECTED   Bacteroides fragilis NOT DETECTED NOT DETECTED   Enterobacterales DETECTED (A) NOT DETECTED   Enterobacter cloacae complex NOT DETECTED NOT DETECTED   Escherichia coli DETECTED (A) NOT DETECTED   Klebsiella aerogenes NOT DETECTED NOT DETECTED   Klebsiella oxytoca NOT DETECTED NOT DETECTED   Klebsiella pneumoniae NOT DETECTED NOT DETECTED   Proteus species NOT DETECTED NOT DETECTED   Salmonella species NOT DETECTED NOT DETECTED   Serratia marcescens NOT DETECTED NOT DETECTED   Haemophilus influenzae NOT DETECTED NOT  DETECTED   Neisseria meningitidis NOT DETECTED NOT DETECTED   Pseudomonas aeruginosa NOT DETECTED NOT DETECTED   Stenotrophomonas maltophilia NOT DETECTED NOT DETECTED   Candida albicans NOT DETECTED NOT DETECTED   Candida auris NOT DETECTED NOT DETECTED   Candida glabrata NOT DETECTED NOT DETECTED   Candida krusei NOT DETECTED NOT DETECTED   Candida parapsilosis NOT DETECTED NOT DETECTED   Candida tropicalis NOT DETECTED NOT DETECTED   Cryptococcus neoformans/gattii NOT DETECTED NOT DETECTED   CTX-M ESBL NOT DETECTED NOT DETECTED   Carbapenem resistance IMP NOT DETECTED NOT DETECTED   Carbapenem resistance KPC NOT DETECTED NOT DETECTED   Carbapenem resistance NDM NOT DETECTED NOT DETECTED   Carbapenem resist OXA 48 LIKE NOT DETECTED NOT DETECTED   Carbapenem resistance VIM NOT DETECTED NOT DETECTED   Juliette Alcide, PharmD, BCPS, BCIDP Work Cell: [phone] 12/17/2022 8:42 AM

## 2022-12-18 DIAGNOSIS — R7881 Bacteremia: Secondary | ICD-10-CM | POA: Diagnosis present

## 2022-12-18 DIAGNOSIS — R6520 Severe sepsis without septic shock: Secondary | ICD-10-CM | POA: Diagnosis not present

## 2022-12-18 LAB — BASIC METABOLIC PANEL
Anion gap: 14 (ref 5–15)
BUN: 9 mg/dL (ref 8–23)
CO2: 22 mmol/L (ref 22–32)
Calcium: 9.1 mg/dL (ref 8.9–10.3)
Chloride: 99 mmol/L (ref 98–111)
Creatinine, Ser: 0.9 mg/dL (ref 0.44–1.00)
GFR, Estimated: >60 mL/min (ref >60)
Glucose, Bld: 261 mg/dL — ABNORMAL HIGH (ref 70–99)
Potassium: 3.6 mmol/L (ref 3.5–5.1)
Sodium: 135 mmol/L (ref 135–145)

## 2022-12-18 LAB — CBC WITH DIFFERENTIAL/PLATELET
Abs Immature Granulocytes: 0.14 10*3/uL — ABNORMAL HIGH (ref 0.00–0.07)
Basophils Absolute: 0.1 10*3/uL (ref 0.0–0.1)
Basophils Relative: 0 %
Eosinophils Absolute: 0.1 10*3/uL (ref 0.0–0.5)
Eosinophils Relative: 0 %
HCT: 34.8 % — ABNORMAL LOW (ref 36.0–46.0)
Hemoglobin: 10.7 g/dL — ABNORMAL LOW (ref 12.0–15.0)
Immature Granulocytes: 1 %
Lymphocytes Relative: 4 %
Lymphs Abs: 0.7 10*3/uL (ref 0.7–4.0)
MCH: 22.7 pg — ABNORMAL LOW (ref 26.0–34.0)
MCHC: 30.7 g/dL (ref 30.0–36.0)
MCV: 73.9 fL — ABNORMAL LOW (ref 80.0–100.0)
Monocytes Absolute: 2 10*3/uL — ABNORMAL HIGH (ref 0.1–1.0)
Monocytes Relative: 10 %
Neutro Abs: 16.5 10*3/uL — ABNORMAL HIGH (ref 1.7–7.7)
Neutrophils Relative %: 85 %
Platelets: 167 10*3/uL (ref 150–400)
RBC: 4.71 MIL/uL (ref 3.87–5.11)
RDW: 16.1 % — ABNORMAL HIGH (ref 11.5–15.5)
WBC: 19.4 10*3/uL — ABNORMAL HIGH (ref 4.0–10.5)
nRBC: 0 % (ref 0.0–0.2)

## 2022-12-18 LAB — LACTIC ACID, PLASMA: Lactic Acid, Venous: 1.1 mmol/L (ref 0.5–1.9)

## 2022-12-18 LAB — GLUCOSE, CAPILLARY
Glucose-Capillary: 228 mg/dL — ABNORMAL HIGH (ref 70–99)
Glucose-Capillary: 251 mg/dL — ABNORMAL HIGH (ref 70–99)
Glucose-Capillary: 268 mg/dL — ABNORMAL HIGH (ref 70–99)

## 2022-12-18 LAB — MAGNESIUM: Magnesium: 2.2 mg/dL (ref 1.7–2.4)

## 2022-12-18 LAB — PROCALCITONIN: Procalcitonin: 24.37 ng/mL

## 2022-12-18 LAB — CBG MONITORING, ED: Glucose-Capillary: 179 mg/dL — ABNORMAL HIGH (ref 70–99)

## 2022-12-18 MED ORDER — INSULIN ASPART 100 UNIT/ML IJ SOLN
0.0000 [IU] | Freq: Three times a day (TID) | INTRAMUSCULAR | Status: DC
  Administered 2022-12-18: 8 [IU] via SUBCUTANEOUS
  Administered 2022-12-19 (×2): 3 [IU] via SUBCUTANEOUS
  Administered 2022-12-19: 8 [IU] via SUBCUTANEOUS
  Administered 2022-12-20: 3 [IU] via SUBCUTANEOUS
  Filled 2022-12-18 (×4): qty 1

## 2022-12-18 MED ORDER — LOSARTAN POTASSIUM 50 MG PO TABS
50.0000 mg | ORAL_TABLET | Freq: Every day | ORAL | Status: DC
  Administered 2022-12-18 – 2022-12-20 (×3): 50 mg via ORAL
  Filled 2022-12-18 (×3): qty 1

## 2022-12-18 MED ORDER — INSULIN ASPART 100 UNIT/ML IJ SOLN
0.0000 [IU] | Freq: Every day | INTRAMUSCULAR | Status: DC
  Administered 2022-12-18: 3 [IU] via SUBCUTANEOUS
  Filled 2022-12-18 (×2): qty 1

## 2022-12-18 MED ORDER — SODIUM CHLORIDE 0.9 % IV SOLN
1.0000 g | Freq: Three times a day (TID) | INTRAVENOUS | Status: DC
  Administered 2022-12-18 – 2022-12-19 (×3): 1 g via INTRAVENOUS
  Filled 2022-12-18 (×4): qty 20

## 2022-12-18 MED ORDER — TRAMADOL HCL 50 MG PO TABS: 50.0000 mg | ORAL_TABLET | Freq: Two times a day (BID) | ORAL | Status: DC | PRN

## 2022-12-18 MED ORDER — FUROSEMIDE 40 MG PO TABS
40.0000 mg | ORAL_TABLET | Freq: Two times a day (BID) | ORAL | Status: DC
  Administered 2022-12-18 – 2022-12-20 (×4): 40 mg via ORAL
  Filled 2022-12-18 (×4): qty 1

## 2022-12-18 MED ORDER — METOPROLOL SUCCINATE ER 25 MG PO TB24
12.5000 mg | ORAL_TABLET | Freq: Every day | ORAL | Status: DC
  Administered 2022-12-18 – 2022-12-20 (×3): 12.5 mg via ORAL
  Filled 2022-12-18 (×3): qty 1

## 2022-12-18 NOTE — Progress Notes (Addendum)
Progress Note    Christy [name]  ZOX:[id number] DOB: 11-03-1955  DOA: 12/16/2022 PCP: Ellan Lambert, NP      Brief Narrative:    Medical records reviewed and are as summarized below:  Christy [name] is a 67 y.o. 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 In the ED, she was febrile with a temperature of 103.3 F, tachycardic with heart rate of 137, tachypnea with heart rate in the 30s but she was hypertensive.  Initial lactic acid was 2.3 and it went up to 2.9.  WBC was 26.6.  Procalcitonin was 37.64.  She was found to have severe sepsis secondary to acute UTI and E. coli bacteremia.      Assessment/Plan:   Principal Problem:   Severe sepsis (HCC) Active Problems:   E coli bacteremia   Urinary tract infection   Acute metabolic encephalopathy   Current tobacco use   Hypertension   Controlled type 2 diabetes mellitus without complication, without long-term current use of insulin (HCC)   Anemia   Electrolyte abnormality   AKI (acute kidney injury) (HCC)   Body mass index is 43.82 kg/m.  (Morbid obesity)    Severe sepsis secondary to acute UTI and E. coli bacteremia: Patient is still having fevers.  Change IV ceftriaxone to IV meropenem pending urine and blood culture sensitivity report.    Hypokalemia: Improved   AKI: Improved   Acute metabolic encephalopathy: Improved   Type 2 diabetes mellitus: Glimepiride and metformin have been held.  NovoLog as needed for hyperglycemia.   Hypertension: BP is elevated.  Continue Toprol-XL.  Restart losartan and Lasix.   History of dysphagia noted on chart review: Consult speech therapist for further  evaluation   OSA on CPAP at night   Osteoarthritis bilateral knees: Restart tramadol as needed for pain.   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      Diet Order             DIET DYS 2 Room service appropriate? Yes; Fluid consistency: Thin  Diet effective now                            Consultants: None  Procedures: None    Medications:    aspirin EC  81 mg Oral Daily   cyanocobalamin  1,000 mcg Oral Daily   furosemide  40 mg Oral BID   heparin  5,000 Units Subcutaneous Q8H   insulin aspart  0-9 Units Subcutaneous TID WC   loratadine  10 mg Oral Daily   losartan  50 mg Oral Daily   metoprolol succinate  12.5 mg Oral Daily   nicotine  21 mg Transdermal Daily   oxybutynin  5 mg Oral TID   pantoprazole (PROTONIX) IV  40 mg Intravenous Q12H   sodium chloride flush  3 mL Intravenous Q12H   trihexyphenidyl  2 mg Oral Daily   Continuous Infusions:  cefTRIAXone (ROCEPHIN)  IV Stopped (12/17/22 2216)     Anti-infectives (From admission, onward)    Start     Dose/Rate Route Frequency Ordered Stop   12/16/22 1945  cefTRIAXone (ROCEPHIN) 2 g in sodium chloride 0.9 % 100 mL IVPB        2 g 200 mL/hr over 30 Minutes Intravenous Every 24 hours 12/16/22 1933 12/21/22 1944   12/16/22 1945  azithromycin (ZITHROMAX) 500 mg in sodium chloride 0.9 % 250 mL IVPB  Status:  Discontinued        500 mg 250 mL/hr over 60 Minutes Intravenous Every 24 hours 12/16/22 1933 12/17/22 0915              Family Communication/Anticipated  D/C date and plan/Code Status   DVT prophylaxis: heparin injection 5,000 Units Start: 12/16/22 2215     Code Status: Full Code  Family Communication: None Disposition Plan: Plan to discharge to ALF   Status is: Inpatient Remains inpatient appropriate because: Severe sepsis       Subjective:   She is still having fevers.  Overnight she had a Tmax of 101.8 F.  She complains of pain in bilateral knees from arthritis.  She requested tramadol for pain.  She says she has been taking this at the nursing facility.  No other complaints.  Objective:    Vitals:   12/18/22 0748 12/18/22 0929 12/18/22 0942 12/18/22 1149  BP: 138/84 (!) 154/97  (!) 155/87  Pulse: 100 (!) 109  (!) 107  Resp: (!) 30 18  16   Temp:  100.1 F (37.8 C)  (!) 100.9 F (38.3 C)  TempSrc:    Oral  SpO2: 97% 99%  99%  Weight:   126.9 kg   Height:       No data found.   Intake/Output Summary (Last 24 hours) at 12/18/2022 1212 Last data filed at 12/18/2022 0653 Gross per 24 hour  Intake --  Output 1800 ml  Net -1800 ml   Filed Weights   12/18/22 0942  Weight: 126.9 kg    Exam:  GEN: NAD SKIN: Warm and dry EYES: No pallor or icterus ENT: MMM CV: RRR PULM: CTA B ABD: soft, obese, NT, +BS CNS: AAO x 3, non focal.  Resting tremors of bilateral hands EXT: Bilateral knee tenderness without swelling or erythema.       Data Reviewed:   I have personally reviewed following labs and imaging studies:  Labs: Labs show the following:   Basic Metabolic Panel: Recent Labs  Lab 12/16/22 1847 12/16/22 2302 12/17/22 1200 12/18/22 0935  NA 133*  --  138 135  K 3.2*  --  3.1* 3.6  CL 96*  --  104 99  CO2 23  --  27 22  GLUCOSE 279*  --  190* 261*  BUN 13  --  11 9  CREATININE 1.01* 1.28* 0.98 0.90  CALCIUM 9.4  --  8.2* 9.1  MG  --   --  1.7 2.2  PHOS  --   --  3.2  --    GFR Estimated Creatinine Clearance: 81.7 mL/min (by C-G formula based on SCr of 0.9 mg/dL). Liver Function  Tests: Recent Labs  Lab 12/16/22 1847 12/17/22 1200  AST 24 22  ALT 17 14  ALKPHOS 79 71  BILITOT 2.0* 0.9  PROT 7.8 6.0*  ALBUMIN 3.9 2.9*   No results for input(s): "LIPASE", "AMYLASE" in the last 168  hours. No results for input(s): "AMMONIA" in the last 168 hours. Coagulation profile Recent Labs  Lab 12/16/22 1847 12/17/22 0714  INR 1.2 1.4*    CBC: Recent Labs  Lab 12/16/22 1847 12/16/22 2350 12/18/22 0935  WBC 26.6* 26.7* 19.4*  NEUTROABS 22.5*  --  16.5*  HGB 11.7* 10.3* 10.7*  HCT 36.6 31.7* 34.8*  MCV 71.2* 71.2* 73.9*  PLT 189 167 167   Cardiac Enzymes: No results for input(s): "CKTOTAL", "CKMB", "CKMBINDEX", "TROPONINI" in the last 168 hours. BNP (last 3 results) No results for input(s): "PROBNP" in the last 8760 hours. CBG: Recent Labs  Lab 12/17/22 1010 12/17/22 1710 12/17/22 2153 12/18/22 0733 12/18/22 1140  GLUCAP 218* 209* 214* 179* 228*   D-Dimer: No results for input(s): "DDIMER" in the last 72 hours. Hgb A1c: Recent Labs    12/16/22 1847  HGBA1C 7.1*   Lipid Profile: No results for input(s): "CHOL", "HDL", "LDLCALC", "TRIG", "CHOLHDL", "LDLDIRECT" in the last 72 hours. Thyroid function studies: No results for input(s): "TSH", "T4TOTAL", "T3FREE", "THYROIDAB" in the last 72 hours.  Invalid input(s): "FREET3" Anemia work up: No results for input(s): "VITAMINB12", "FOLATE", "FERRITIN", "TIBC", "IRON", "RETICCTPCT" in the last 72 hours. Sepsis Labs: Recent Labs  Lab 12/16/22 1847 12/16/22 2100 12/16/22 2350 12/17/22 0714 12/18/22 0321 12/18/22 0935  PROCALCITON  --   --   --  37.64  --  24.37  WBC 26.6*  --  26.7*  --   --  19.4*  LATICACIDVEN 2.3* 2.9*  --   --  1.1  --     Microbiology Recent Results (from the past 240 hour(s))  Resp panel by RT-PCR (RSV, Flu A&B, Covid) Anterior Nasal Swab     Status: None   Collection Time: 12/16/22  6:47 PM   Specimen: Anterior Nasal Swab  Result Value Ref Range Status   SARS  Coronavirus 2 by RT PCR NEGATIVE NEGATIVE Final    Comment: (NOTE) SARS-CoV-2 target nucleic acids are NOT DETECTED.  The SARS-CoV-2 RNA is generally detectable in upper respiratory specimens during the acute phase of infection. The lowest concentration of SARS-CoV-2 viral copies this assay can detect is 138 copies/mL. A negative result does not preclude SARS-Cov-2 infection and should not be used as the sole basis for treatment or other patient management decisions. A negative result may occur with  improper specimen collection/handling, submission of specimen other than nasopharyngeal swab, presence of viral mutation(s) within the areas targeted by this assay, and inadequate number of viral copies(<138 copies/mL). A negative result must be combined with clinical observations, patient history, and epidemiological information. The expected result is Negative.  Fact Sheet for Patients:  BloggerCourse.com  Fact Sheet for Healthcare Providers:  SeriousBroker.it  This test is no t yet approved or cleared by the [place] FDA and  has been authorized for detection and/or diagnosis of SARS-CoV-2 by FDA under an Emergency Use Authorization (EUA). This EUA will remain  in effect (meaning this test can be used) for the duration of the COVID-19 declaration under Section 564(b)(1) of the Act, 21 U.S.C.section 360bbb-3(b)(1), unless the authorization is terminated  or revoked sooner.       Influenza A by PCR NEGATIVE NEGATIVE Final   Influenza B by PCR NEGATIVE NEGATIVE Final    Comment: (NOTE) The Xpert Xpress SARS-CoV-2/FLU/RSV plus assay is intended as an aid in the diagnosis of influenza from Nasopharyngeal swab specimens and should not be used as a sole basis for treatment. Nasal washings and aspirates are  unacceptable for Xpert Xpress SARS-CoV-2/FLU/RSV testing.  Fact Sheet for  Patients: BloggerCourse.com  Fact Sheet for Healthcare Providers: SeriousBroker.it  This test is not yet approved or cleared by the [place] FDA and has been authorized for detection and/or diagnosis of SARS-CoV-2 by FDA under an Emergency Use Authorization (EUA). This EUA will remain in effect (meaning this test can be used) for the duration of the COVID-19 declaration under Section 564(b)(1) of the Act, 21 U.S.C. section 360bbb-3(b)(1), unless the authorization is terminated or revoked.     Resp Syncytial Virus by PCR NEGATIVE NEGATIVE Final    Comment: (NOTE) Fact Sheet for Patients: BloggerCourse.com  Fact Sheet for Healthcare Providers: SeriousBroker.it  This test is not yet approved or cleared by the [place] FDA and has been authorized for detection and/or diagnosis of SARS-CoV-2 by FDA under an Emergency Use Authorization (EUA). This EUA will remain in effect (meaning this test can be used) for the duration of the COVID-19 declaration under Section 564(b)(1) of the Act, 21 U.S.C. section 360bbb-3(b)(1), unless the authorization is terminated or revoked.  Performed at [facility], [address] [place], [place] [address]   Culture, blood (Routine x 2)     Status: None (Preliminary result)   Collection Time: 12/16/22  6:59 PM   Specimen: BLOOD  Result Value Ref Range Status   Specimen Description   Final    BLOOD BLOOD LEFT WRIST Performed at [facility], [address] [place], [place] [address]    Special Requests   Final    BOTTLES DRAWN AEROBIC AND ANAEROBIC Blood Culture adequate volume Performed at [facility], [address] [place], [place] [address]    Culture  Setup Time   Final    GRAM NEGATIVE RODS IN BOTH AEROBIC AND ANAEROBIC BOTTLES CRITICAL RESULT CALLED TO, READ BACK BY AND VERIFIED WITH: EMILY  STEINBOCK AT 6644 12/17/22.PMF Performed at [facility], [address], [place], [place] [address]    Culture   Final    Romie Minus NEGATIVE RODS IDENTIFICATION TO FOLLOW Performed at [facility], [address] [place], [place] [address]    Report Status PENDING  Incomplete  Urine Culture     Status: Abnormal (Preliminary result)   Collection Time: 12/16/22  6:59 PM   Specimen: Urine, Random  Result Value Ref Range Status   Specimen Description   Final    URINE, RANDOM Performed at [facility], [address] [place], [place] [address]    Special Requests   Final    NONE Reflexed from [id number] Performed at [facility], [address] [place], [place] [address]    Culture >=100,000 COLONIES/mL GRAM NEGATIVE RODS (A)  Final   Report Status PENDING  Incomplete  Culture, blood (Routine x 2)     Status: Abnormal (Preliminary result)   Collection Time: 12/16/22  7:04 PM   Specimen: BLOOD  Result Value Ref Range Status   Specimen Description   Final    BLOOD LEFT ANTECUBITAL Performed at [facility], [address] [place], [place] [address]    Special Requests   Final    BOTTLES DRAWN AEROBIC AND ANAEROBIC Blood Culture results may not be optimal due to an excessive volume of blood received in culture bottles Performed at [facility], [address] [place], [place] [address]    Culture  Setup Time   Final    GRAM NEGATIVE RODS IN BOTH AEROBIC AND ANAEROBIC  BOTTLES CRITICAL RESULT CALLED TO, READ BACK BY AND VERIFIED WITH: EMILY STEINBOCK AT 1610 12/17/22.PMF    Culture (A)  Final    ESCHERICHIA COLI SUSCEPTIBILITIES TO FOLLOW Performed at [facility], [address] [place], [place] [address]    Report Status PENDING  Incomplete  Blood Culture ID Panel (Reflexed)     Status: Abnormal   Collection Time: 12/16/22  7:04 PM  Result Value Ref Range Status   Enterococcus faecalis NOT DETECTED NOT DETECTED Final    Enterococcus Faecium NOT DETECTED NOT DETECTED Final   Listeria monocytogenes NOT DETECTED NOT DETECTED Final   Staphylococcus species NOT DETECTED NOT DETECTED Final   Staphylococcus aureus (BCID) NOT DETECTED NOT DETECTED Final   Staphylococcus epidermidis NOT DETECTED NOT DETECTED Final   Staphylococcus lugdunensis NOT DETECTED NOT DETECTED Final   Streptococcus species NOT DETECTED NOT DETECTED Final   Streptococcus agalactiae NOT DETECTED NOT DETECTED Final   Streptococcus pneumoniae NOT DETECTED NOT DETECTED Final   Streptococcus pyogenes NOT DETECTED NOT DETECTED Final   A.calcoaceticus-baumannii NOT DETECTED NOT DETECTED Final   Bacteroides fragilis NOT DETECTED NOT DETECTED Final   Enterobacterales DETECTED (A) NOT DETECTED Final    Comment: Enterobacterales represent a large order of gram negative bacteria, not a single organism. CRITICAL RESULT CALLED TO, READ BACK BY AND VERIFIED WITH: EMILY STEINBOCK AT 4098 12/17/22.PMF    Enterobacter cloacae complex NOT DETECTED NOT DETECTED Final   Escherichia coli DETECTED (A) NOT DETECTED Final    Comment: CRITICAL RESULT CALLED TO, READ BACK BY AND VERIFIED WITH: EMILY STEINBOCK AT 1191 12/17/22.PMF    Klebsiella aerogenes NOT DETECTED NOT DETECTED Final   Klebsiella oxytoca NOT DETECTED NOT DETECTED Final   Klebsiella pneumoniae NOT DETECTED NOT DETECTED Final   Proteus species NOT DETECTED NOT DETECTED Final   Salmonella species NOT DETECTED NOT DETECTED Final   Serratia marcescens NOT DETECTED NOT DETECTED Final   Haemophilus influenzae NOT DETECTED NOT DETECTED Final   Neisseria meningitidis NOT DETECTED NOT DETECTED Final   Pseudomonas aeruginosa NOT DETECTED NOT DETECTED Final   Stenotrophomonas maltophilia NOT DETECTED NOT DETECTED Final   Candida albicans NOT DETECTED NOT DETECTED Final   Candida auris NOT DETECTED NOT DETECTED Final   Candida glabrata NOT DETECTED NOT DETECTED Final   Candida krusei NOT DETECTED NOT  DETECTED Final   Candida parapsilosis NOT DETECTED NOT DETECTED Final   Candida tropicalis NOT DETECTED NOT DETECTED Final   Cryptococcus neoformans/gattii NOT DETECTED NOT DETECTED Final   CTX-M ESBL NOT DETECTED NOT DETECTED Final   Carbapenem resistance IMP NOT DETECTED NOT DETECTED Final   Carbapenem resistance KPC NOT DETECTED NOT DETECTED Final   Carbapenem resistance NDM NOT DETECTED NOT DETECTED Final   Carbapenem resist OXA 48 LIKE NOT DETECTED NOT DETECTED Final   Carbapenem resistance VIM NOT DETECTED NOT DETECTED Final    Comment: Performed at [facility], [address] [place], [place] [address]    Procedures and diagnostic studies:  CT Angio Chest PE W/Cm &/Or Wo Cm  Result Date: 12/16/2022 CLINICAL DATA:  Fevers and possible sepsis, initial encounter EXAM: CT ANGIOGRAPHY CHEST WITH CONTRAST TECHNIQUE: Multidetector CT imaging of the chest was performed using the standard protocol during bolus administration of intravenous contrast. Multiplanar CT image reconstructions and MIPs were obtained to evaluate the vascular anatomy. RADIATION DOSE REDUCTION: This exam was performed according to the departmental dose-optimization program which includes automated exposure control, adjustment of the mA and/or kV according to  patient size and/or use of iterative reconstruction technique. CONTRAST:  OMNIPAQUE IOHEXOL 350 MG/ML SOLN COMPARISON:  Chest x-ray from earlier in the same day. FINDINGS: Cardiovascular: Thoracic aorta is within normal limits. No cardiac enlargement is noted. The pulmonary artery shows a normal branching pattern bilaterally. No definitive filling defect to suggest pulmonary embolism is noted. No significant coronary calcifications are noted. Mediastinum/Nodes: Thoracic inlet is within normal limits. No hilar or mediastinal adenopathy is noted. The esophagus as visualized is within normal limits. Lungs/Pleura: Lungs are well aerated bilaterally. No focal  infiltrate or effusion is seen. Upper Abdomen: Visualized upper abdomen is unremarkable. Musculoskeletal: Bony structures appear within normal limits. Review of the MIP images confirms the above findings. IMPRESSION: No evidence of pulmonary emboli. No acute abnormality seen. Electronically Signed   By: Alcide Clever M.D.   On: 12/16/2022 21:50   DG Chest 1 View  Result Date: 12/16/2022 CLINICAL DATA:  Sepsis, altered mental status EXAM: CHEST  1 VIEW COMPARISON:  07/20/2022 FINDINGS: Lungs are clear. Heart size and mediastinal contours are within normal limits. No effusion. Visualized bones unremarkable. IMPRESSION: No acute cardiopulmonary disease. Electronically Signed   By: Corlis Leak M.D.   On: 12/16/2022 19:56               LOS: 2 days   Carlie Corpus  [facility]   Pager on [email or web address]. If 7PM-7AM, please contact night-coverage at [email or web address]     12/18/2022, 12:12 PM

## 2022-12-18 NOTE — Consult Note (Signed)
Pharmacy Antibiotic Note  Christy [name] is a 67 y.o. female admitted on 12/16/2022 with sepsis with encephalopathy with a suspected urinary source. Patient was initially started on ceftriaxone, but given patient still febrile will switch to meropenem until blood and urine culture sensitivities result. Pharmacy has been consulted for meropenem dosing.  Today, 12/18/2022 WBC down trending; 26.7 > 19.4 Febrile, Tmax 100.9 PCT 37.64 > 24.37  SCr 0.9 today with estimated CrCl 81.7 mL/min  Blood culture - E.coli with sensitivities pending  Urine culture - E.coli with sensitivities pending   Plan: Start meropenem 1 gm Q8H based on current renal function  F/u blood and urine culture sensitivities for de-escalation  Pharmacy will continue to monitor and dose adjust appropriately   Height: 5\' 7"  (170.2 cm) Weight: 126.9 kg (279 lb 12.2 oz) IBW/kg (Calculated) : 61.6  Temp (24hrs), Avg:100.3 F (37.9 C), Min:98.2 F (36.8 C), Max:103.1 F (39.5 C)  Recent Labs  Lab 12/16/22 1847 12/16/22 2100 12/16/22 2302 12/16/22 2350 12/17/22 1200 12/18/22 0321 12/18/22 0935  WBC 26.6*  --   --  26.7*  --   --  19.4*  CREATININE 1.01*  --  1.28*  --  0.98  --  0.90  LATICACIDVEN 2.3* 2.9*  --   --   --  1.1  --     Estimated Creatinine Clearance: 81.7 mL/min (by C-G formula based on SCr of 0.9 mg/dL).    Allergies  Allergen Reactions   Benzoyl Peroxide    Cephalexin Itching   Other Diarrhea    Milk containing products (dairy) causes diarrhea   Peroxide [Hydrogen Peroxide] Swelling   Shellfish Allergy Nausea And Vomiting   Antimicrobials this admission: Azithromycin 11/21 x 1 Ceftriaxone 11/21 >> 11/22 Meropenem 11/23 >>   Dose adjustments this admission:  Microbiology results: 11/21 BCx: E.coli - sensitivities pending  11/21 UCx: E.coli - sensitivities pending   Thank you for allowing pharmacy to be a part of this patient's care.  Littie Deeds, PharmD Pharmacy Resident   12/18/2022 12:17 PM

## 2022-12-18 NOTE — ED Notes (Signed)
Victorino Dike RN aware of assigned bed

## 2022-12-18 NOTE — ED Notes (Signed)
Provided perineal care, and changed incontinent pad, No other needs  at present.

## 2022-12-18 NOTE — Evaluation (Addendum)
Clinical/Bedside Swallow Evaluation Patient Details  Name: Christy [name] MRN: [id number] Date of Birth: 30-Mar-1955  Today's Date: 12/18/2022 Time: SLP Start Time (ACUTE ONLY): 1125 SLP Stop Time (ACUTE ONLY): 1225 SLP Time Calculation (min) (ACUTE ONLY): 60 min  Past Medical History:  Past Medical History:  Diagnosis Date   Arthritis    Asthma    Diabetes mellitus without complication (HCC)    Patient takes Metformin   Edema    Genital herpes    GERD (gastroesophageal reflux disease)    Hyperlipidemia    Hypertension    Malaise and fatigue    Mental status alteration    Schizophrenia (HCC)    Past Surgical History:  Past Surgical History:  Procedure Laterality Date   ABDOMINAL HYSTERECTOMY     due to fibroids in 1997   CHOLECYSTECTOMY     COLONOSCOPY WITH PROPOFOL N/A 08/15/2015   Procedure: COLONOSCOPY WITH PROPOFOL;  Surgeon: Christena Deem, MD;  Location: [facility] ENDOSCOPY;  Service: Endoscopy;  Laterality: N/A;   ESOPHAGOGASTRODUODENOSCOPY (EGD) WITH PROPOFOL N/A 08/15/2015   Procedure: ESOPHAGOGASTRODUODENOSCOPY (EGD) WITH PROPOFOL;  Surgeon: Christena Deem, MD;  Location: [facility] ENDOSCOPY;  Service: Endoscopy;  Laterality: N/A;   FOOT SURGERY Right    GANGLION CYST EXCISION     TONSILLECTOMY     UPPER GI ENDOSCOPY  07/31/13   mild chronic inflammation and reactive gastropathy-no need for another EGD repeat   HPI:  Pt is a 67 y.o. 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  She resides at [facility].   CT of Chest:  Lungs are well aerated bilaterally. No focal  infiltrate or effusion is seen.  Admiting Dx: severe sepsis secondary to acute UTI  and E. coli bacteremia.    Assessment / Plan / Recommendation  Clinical Impression   Pt seen for BSE today. Pt awake, verbal and communicated in basic conversation appropriately. She answered basic questions re: self; followed instructions w/ cue. Suspected Vascular Dementia per chart. She also helped to sit herself upright/forward in bed for po intake.  Pt endorsed s/s of Esophageal phase Dysmotility w/ specific c/o difficulty swallowing "meats and breads". She has NOT been seen by a GI per her report.  Pt on [place] o2 2-3L; afebrile.   Pt appears to present w/ grossly functional oropharyngeal phase swallowing w/ a slightly modified diet (food) consistency d/t Missing Dentition, w/ No overt oropharyngeal phase dysphagia noted. Pt consumed po trials w/ No overt clinical s/s of aspiration during po intake when following general aspiration precautions.  Pt appears at reduced risk for aspiration/aspiration pneumonia when following general aspiration precautions (Baseline for pt -- see MBSS in 2022, dyskinesia baseline) and using a slightly modified diet d/t Missing Dentition.  Pt does have challenging factors that could impact her oropharyngeal swallowing to include deconditioning/ weakness, UE tremors/dyskinesia hampering her UE movements and self-feeding abilities, and Cognitive decline (ANY Cognitive decline can impact the timing and awareness during swallowing). Pt also needs some support w/ tray setup and sitting up for meals. These factors can increase risk for aspiration, dysphagia as well as decreased oral intake overall.  During po trials, pt consumed all consistencies w/ no overt coughing (except 1x when slurping the last of the juice via  straw), decline in vocal quality, or change in respiratory presentation during/post trials. O2 sats remained in upper 90s when checked. Oral phase appeared grossly [facility] w/ timely bolus management and control of bolus propulsion for A-P transfer for swallowing.  Increased mastication time noted w/ increased textured boluses. Oral clearing achieved w/ all trial consistencies w/ Time given -- moistened, soft foods given.  OM Exam appeared grossly [facility] w/ no gross unilateral weakness noted. Speech Clear. Pt fed self w/ setup and support.   Recommend a Mech soft consistency diet w/ well-moistened foods and MINCED meats; Thin liquids -- carefully monitor straw use, and pt should help to Hold Cup when drinking. Recommend general aspiration precautions, tray setup and sitting up support. Reduce distractions and talking at meals. Feeding support as needed d/t shaky UEs. REFLUX precautions. Pills WHOLE in Puree for safer, easier swallowing.  Education given on Pills in Puree; food consistencies and easy to eat options; general aspiration precautions to pt. Pt's oropharyngeal swallow appears at her Baseline.  In setting of suspected Esophageal phase Dysmotility, monitor and recommend f/u w/ GI for Esophageal phase Dysmotility and c/o Globus w/ meats/breads.  MD to reconsult if any new needs arise. NSG updated, agreed. MD updated. Recommend Dietician f/u for support. SLP Visit Diagnosis: Dysphagia, unspecified (R13.10) (suspect Esophageal phase Dysmotility - REFLUX s/s reported; Globus w/ meats/breads. Pt w/ min lengthy mastication time w/ solids d/t missing Dentition.)    Aspiration Risk  Mild aspiration risk;Risk for inadequate nutrition/hydration (reduced when following general aspiration precautions)    Diet Recommendation   Thin;Dysphagia 3 (mechanical soft) (meats MINCED; gravies) = a Mech soft consistency diet w/ well-moistened foods and MINCED meats; Thin liquids -- carefully monitor straw use, and pt should help to Hold Cup when drinking. Recommend general aspiration precautions, tray setup and sitting up support. Reduce distractions and talking at meals. Feeding support as needed d/t shaky UEs. REFLUX precautions.   Medication Administration: Whole meds with  puree (as needed for ease, safety)    Other  Recommendations Recommended Consults: Consider GI evaluation;Consider esophageal assessment (Dietician f/u) Oral Care Recommendations: Oral care BID;Oral care before and after PO;Staff/trained caregiver to provide oral care (support)    Recommendations for follow up therapy are one component of a multi-disciplinary discharge planning process, led by the attending physician.  Recommendations may be updated based on patient status, additional functional criteria and insurance authorization.  Follow up Recommendations No SLP follow up      Assistance Recommended at Discharge  Intermittent>full for setup and support at meals  Functional Status Assessment Patient has not had a recent decline in their functional status  Frequency and Duration  (n/a)   (n/a)       Prognosis Prognosis for improved oropharyngeal function: Fair Barriers to Reach Goals: Cognitive deficits;Time post onset;Severity of deficits Barriers/Prognosis Comment: suspect Esophageal phase Dysmotility - REFLUX s/s reported; Globus w/ meats/breads. Pt w/ min lengthy mastication time w/ solids d/t missing Dentition. Oral Dyskinesia baseline.      Swallow Study   General Date of Onset: 12/16/22 HPI: Pt is a 67 y.o. 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  She resides at [facility].   CT of Chest:  Lungs are well aerated bilaterally. No focal  infiltrate or effusion is seen.  Admiting Dx: severe sepsis secondary to acute UTI and E. coli bacteremia. Type of Study: Bedside Swallow Evaluation Previous Swallow Assessment: MBSS in 04/2020: mild oropharyngeal  dysphagia with function appearing similar to previous MBS in 2017. Dyskinesia baseline. Shallow laryngeal penetration w/ larger sips of thin and nectar liquids. Regular diet w/ thin liquids rec'd then. Diet Prior to this Study: Dysphagia 2 (finely chopped);Thin liquids (Level 0) (per MD at admit) Temperature Spikes Noted: No (WBC trending down) Respiratory Status: Nasal cannula (2-3L) History of Recent Intubation: No Behavior/Cognition: Alert;Cooperative;Pleasant mood;Confused;Distractible;Requires cueing (baseline vascular dementia suspected per chart) Oral Cavity Assessment: Within Functional Limits Oral Care Completed by SLP: Yes Oral Cavity - Dentition: Missing dentition;Poor condition Vision: Functional for self-feeding Self-Feeding Abilities: Able to feed self;Needs assist;Needs set up (shaky UEs baseline) Patient Positioning: Upright in bed (needed full support w/ sitting up/forward) Baseline Vocal Quality: Normal Volitional Cough: Strong Volitional Swallow: Able to elicit    Oral/Motor/Sensory Function Overall Oral Motor/Sensory Function: Mild impairment (baseline oral dyskinesia; strength WFL)   Ice Chips Ice chips: Within functional limits Presentation: Spoon (fed; 2 trials)   Thin Liquid Thin Liquid: Within functional limits Presentation: Self Fed;Straw (~7-8 ozs w/ mild throat clear/cough x1 when slurping last of the juice via straw) Other Comments: water, juice    Nectar Thick Nectar Thick Liquid: Not tested   Honey Thick Honey Thick Liquid: Not tested   Puree Puree: Within functional limits Presentation: Self Fed;Spoon (supported, 4 ozs)   Solid     Solid: Impaired (min increased mastication time/effort d/t missing dentition) Presentation: Self Fed;Spoon (supported, 9 trials) Oral Phase Impairments: Impaired mastication (missing dentition) Pharyngeal Phase Impairments:  (none) Other Comments: moistened well         Jerilynn Som, MS, CCC-SLP Speech Language  Pathologist Rehab Services; [facility] [place] [phone] (ascom) Lillionna Nabi 12/18/2022,1:49 PM

## 2022-12-19 DIAGNOSIS — R6520 Severe sepsis without septic shock: Secondary | ICD-10-CM | POA: Diagnosis not present

## 2022-12-19 LAB — CBC WITH DIFFERENTIAL/PLATELET
Abs Immature Granulocytes: 0.25 10*3/uL — ABNORMAL HIGH (ref 0.00–0.07)
Basophils Absolute: 0.1 10*3/uL (ref 0.0–0.1)
Basophils Relative: 1 %
Eosinophils Absolute: 0.3 10*3/uL (ref 0.0–0.5)
Eosinophils Relative: 2 %
HCT: 29.9 % — ABNORMAL LOW (ref 36.0–46.0)
Hemoglobin: 9.5 g/dL — ABNORMAL LOW (ref 12.0–15.0)
Immature Granulocytes: 2 %
Lymphocytes Relative: 8 %
Lymphs Abs: 1.3 10*3/uL (ref 0.7–4.0)
MCH: 22.6 pg — ABNORMAL LOW (ref 26.0–34.0)
MCHC: 31.8 g/dL (ref 30.0–36.0)
MCV: 71 fL — ABNORMAL LOW (ref 80.0–100.0)
Monocytes Absolute: 1.6 10*3/uL — ABNORMAL HIGH (ref 0.1–1.0)
Monocytes Relative: 10 %
Neutro Abs: 12.8 10*3/uL — ABNORMAL HIGH (ref 1.7–7.7)
Neutrophils Relative %: 77 %
Platelets: 181 10*3/uL (ref 150–400)
RBC: 4.21 MIL/uL (ref 3.87–5.11)
RDW: 15.6 % — ABNORMAL HIGH (ref 11.5–15.5)
WBC: 16.4 10*3/uL — ABNORMAL HIGH (ref 4.0–10.5)
nRBC: 0 % (ref 0.0–0.2)

## 2022-12-19 LAB — BASIC METABOLIC PANEL
Anion gap: 10 (ref 5–15)
BUN: 11 mg/dL (ref 8–23)
CO2: 26 mmol/L (ref 22–32)
Calcium: 9.3 mg/dL (ref 8.9–10.3)
Chloride: 101 mmol/L (ref 98–111)
Creatinine, Ser: 0.84 mg/dL (ref 0.44–1.00)
GFR, Estimated: >60 mL/min (ref >60)
Glucose, Bld: 204 mg/dL — ABNORMAL HIGH (ref 70–99)
Potassium: 3.8 mmol/L (ref 3.5–5.1)
Sodium: 137 mmol/L (ref 135–145)

## 2022-12-19 LAB — CULTURE, BLOOD (ROUTINE X 2): Special Requests: ADEQUATE

## 2022-12-19 LAB — URINE CULTURE: Culture: >=100000 — AB

## 2022-12-19 LAB — GLUCOSE, CAPILLARY
Glucose-Capillary: 187 mg/dL — ABNORMAL HIGH (ref 70–99)
Glucose-Capillary: 169 mg/dL — ABNORMAL HIGH (ref 70–99)
Glucose-Capillary: 273 mg/dL — ABNORMAL HIGH (ref 70–99)
Glucose-Capillary: 198 mg/dL — ABNORMAL HIGH (ref 70–99)

## 2022-12-19 MED ORDER — LEVOFLOXACIN 500 MG PO TABS
500.0000 mg | ORAL_TABLET | Freq: Every day | ORAL | Status: DC
  Administered 2022-12-19 – 2022-12-20 (×2): 500 mg via ORAL
  Filled 2022-12-19 (×3): qty 1

## 2022-12-19 NOTE — Evaluation (Signed)
Physical Therapy Evaluation Patient Details Name: Christy [name] MRN: [id number] DOB: December 16, 1955 Today's Date: 12/19/2022  History of Present Illness  Christy [name] is a 67 y.o. 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 She was found to have severe sepsis secondary to acute UTI and E. coli bacteremia.  Clinical Impression  Patient received in bed eating lunch. She is agreeable to PT session. Patient is very polite and pleasant. She is mod I with bed mobility. Stands from low bed with min A to boost up and get balanced. She is able to ambulate 3 feet to recliner with RW and min A. Patient feels too weak to attempt ambulating further at this time. She will continue to benefit from skilled PT to improve strength, endurance and safety.         If plan is discharge home, recommend the following: A little help with walking and/or transfers;A little help with bathing/dressing/bathroom;Assist for transportation;Assistance with cooking/housework;Help with stairs or ramp for entrance;Direct supervision/assist for medications management;Assistance with feeding   Can travel by private vehicle    no    Equipment Recommendations None recommended by PT  Recommendations for Other Services       Functional Status Assessment Patient has had a recent decline in their functional status and demonstrates the ability to make significant improvements in function in a reasonable and predictable amount of time.     Precautions / Restrictions Precautions Precautions: Fall Restrictions Weight Bearing Restrictions: No      Mobility  Bed Mobility Overal bed mobility: Modified Independent              General bed mobility comments: increased time needed and bed rails. No physical assist provided    Transfers Overall transfer level: Needs assistance Equipment used: Rolling walker (2 wheels) Transfers: Sit to/from Stand Sit to Stand: Min assist           General transfer comment: min A to boost up to standing    Ambulation/Gait Ambulation/Gait assistance: Contact guard assist Gait Distance (Feet): 3 Feet Assistive device: Rolling walker (2 wheels) Gait Pattern/deviations: Step-to pattern, Decreased step length - right, Decreased step length - left, Decreased stride length Gait velocity: decr     General Gait Details: patient reports she feels very weak and declines ambulating further than to recliner at this time. After getting up to recliner O2 sats on room air were 97%. Patient left on room air RN notified.  Stairs            Wheelchair Mobility     Tilt Bed    Modified Rankin (Stroke Patients Only)       Balance Overall balance assessment: Needs assistance Sitting-balance support: Feet supported Sitting balance-Leahy Scale: Good     Standing balance support: Bilateral upper extremity supported, During functional activity, Reliant on assistive device for balance Standing balance-Leahy Scale: Fair                               Pertinent Vitals/Pain Pain Assessment Pain Assessment: No/denies pain    Home Living Family/patient expects to be discharged to:: Assisted living  Home Equipment: Rollator (4 wheels)      Prior Function Prior Level of Function : Independent/Modified Independent             Mobility Comments: ambulates with rollator at baseline. Staff assists with meals and medications       Extremity/Trunk Assessment   Upper Extremity Assessment Upper Extremity Assessment: Generalized weakness    Lower Extremity Assessment Lower Extremity Assessment: Generalized weakness    Cervical /  Trunk Assessment Cervical / Trunk Assessment: Normal  Communication   Communication Communication: No apparent difficulties Cueing Techniques: Verbal cues  Cognition Arousal: Alert Behavior During Therapy: WFL for tasks assessed/performed Overall Cognitive Status: Within Functional Limits for tasks assessed                                 General Comments: very pleasant        General Comments      Exercises     Assessment/Plan    PT Assessment Patient needs continued PT services  PT Problem List Decreased strength;Decreased activity tolerance;Decreased balance;Decreased mobility       PT Treatment Interventions DME instruction;Gait training;Therapeutic exercise;Balance training;Functional mobility training;Therapeutic activities;Patient/family education    PT Goals (Current goals can be found in the Care Plan section)  Acute Rehab PT Goals Patient Stated Goal: return to ALF PT Goal Formulation: With patient Time For Goal Achievement: 01/02/23 Potential to Achieve Goals: Good    Frequency Min 1X/week     Co-evaluation               AM-PAC PT "6 Clicks" Mobility  Outcome Measure Help needed turning from your back to your side while in a flat bed without using bedrails?: A Little Help needed moving from lying on your back to sitting on the side of a flat bed without using bedrails?: A Little Help needed moving to and from a bed to a chair (including a wheelchair)?: A Little Help needed standing up from a chair using your arms (e.g., wheelchair or bedside chair)?: A Little Help needed to walk in hospital room?: A Lot Help needed climbing 3-5 steps with a railing? : A Lot 6 Click Score: 16    End of Session Equipment Utilized During Treatment: Gait belt;Oxygen Activity Tolerance: Patient limited by fatigue Patient left: in chair;with call bell/phone within reach;with chair alarm set Nurse Communication: Mobility status PT Visit Diagnosis:  Unsteadiness on feet (R26.81);Other abnormalities of gait and mobility (R26.89);Muscle weakness (generalized) (M62.81);Difficulty in walking, not elsewhere classified (R26.2)    Time: 6045-4098 PT Time Calculation (min) (ACUTE ONLY): 22 min   Charges:   PT Evaluation $PT Eval Moderate Complexity: 1 Mod PT Treatments $Therapeutic Activity: 8-22 mins PT General Charges $$ ACUTE PT VISIT: 1 Visit         Coltrane Tugwell, PT, GCS 12/19/22,2:15 PM

## 2022-12-19 NOTE — Progress Notes (Addendum)
Progress Note    Christy [name]  RJJ:[id number] DOB: 06/21/1955  DOA: 12/16/2022 PCP: Ellan Lambert, NP      Brief Narrative:    Medical records reviewed and are as summarized below:  Christy [name] is a 67 y.o. 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 In the ED, she was febrile with a temperature of 103.3 F, tachycardic with heart rate of 137, tachypnea with heart rate in the 30s but she was hypertensive.  Initial lactic acid was 2.3 and it went up to 2.9.  WBC was 26.6.  Procalcitonin was 37.64.  She was found to have severe sepsis secondary to acute UTI and E. coli bacteremia.      Assessment/Plan:   Principal Problem:   Severe sepsis (HCC) Active Problems:   E coli bacteremia   Urinary tract infection   Acute metabolic encephalopathy   Current tobacco use   Hypertension   Controlled type 2 diabetes mellitus without complication, without long-term current use of insulin (HCC)   Anemia   Electrolyte abnormality   AKI (acute kidney injury) (HCC)   Body mass index is 43.82 kg/m.  (Morbid obesity)    Severe sepsis secondary to acute E. coli UTI and E. coli bacteremia: Fever appears to have subsided.  Change IV meropenem to oral Levaquin based on sensitivity report.     Hypokalemia: Improved   AKI: Improved   Acute metabolic encephalopathy: Improved   Type 2 diabetes mellitus: Glimepiride and metformin have been held.  NovoLog as needed for hyperglycemia.   Hypertension:  Continue Toprol-XL, losartan and Lasix.     History of dysphagia noted on chart review: Speech therapist recommended dysphagia 3 diet.     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   Transfer from progressive cardiac unit to MedSurg.   Diet Order             DIET DYS 3 Room service appropriate? Yes with Assist; Fluid consistency: Thin  Diet effective now                            Consultants: None  Procedures: None    Medications:    aspirin EC  81 mg Oral Daily   cyanocobalamin  1,000 mcg Oral Daily   furosemide  40 mg Oral BID   heparin  5,000 Units Subcutaneous Q8H   insulin aspart  0-15 Units Subcutaneous TID WC   insulin aspart  0-5 Units Subcutaneous QHS   levofloxacin  500 mg Oral Daily   loratadine  10 mg Oral Daily   losartan  50 mg Oral Daily   metoprolol succinate  12.5 mg Oral Daily   nicotine  21 mg Transdermal Daily   oxybutynin  5  mg Oral TID   pantoprazole (PROTONIX) IV  40 mg Intravenous Q12H   sodium chloride flush  3 mL Intravenous Q12H   trihexyphenidyl  2 mg Oral Daily   Continuous Infusions:     Anti-infectives (From admission, onward)    Start     Dose/Rate Route Frequency Ordered Stop   12/19/22 1245  levofloxacin (LEVAQUIN) tablet 500 mg        500 mg Oral Daily 12/19/22 1146 12/23/22 0959   12/18/22 1400  meropenem (MERREM) 1 g in sodium chloride 0.9 % 100 mL IVPB  Status:  Discontinued        1 g 200 mL/hr over 30 Minutes Intravenous Every 8 hours 12/18/22 1235 12/19/22 1145   12/16/22 1945  cefTRIAXone (ROCEPHIN) 2 g in sodium  chloride 0.9 % 100 mL IVPB  Status:  Discontinued        2 g 200 mL/hr over 30 Minutes Intravenous Every 24 hours 12/16/22 1933 12/18/22 1215   12/16/22 1945  azithromycin (ZITHROMAX) 500 mg in sodium chloride 0.9 % 250 mL IVPB  Status:  Discontinued        500 mg 250 mL/hr over 60 Minutes Intravenous Every 24 hours 12/16/22 1933 12/17/22 0915              Family Communication/Anticipated D/C date and plan/Code Status   DVT prophylaxis: heparin injection 5,000 Units Start: 12/16/22 2215     Code Status: Full Code  Family Communication: None Disposition Plan: Plan to discharge to ALF   Status is: Inpatient Remains inpatient appropriate because: Severe sepsis       Subjective:   No fever overnight.  He still has some pain in the knees.  No other complaints.  Objective:    Vitals:   12/18/22 2000 12/18/22 2243 12/19/22 0400 12/19/22 0845  BP: 135/75 133/66 (!) 142/76 (!) 152/80  Pulse: 100 100 99 84  Resp:  16 16 17   Temp: 98.6 F (37 C) 98.5 F (36.9 C) 98.6 F (37 C) 98.1 F (36.7 C)  TempSrc:      SpO2: 97% 97% 99% 100%  Weight:      Height:       No data found.   Intake/Output Summary (Last 24 hours) at 12/19/2022 1146 Last data filed at 12/19/2022 0800 Gross per 24 hour  Intake 500 ml  Output 800 ml  Net -300 ml   Filed Weights   12/18/22 0942  Weight: 126.9 kg    Exam:  GEN: NAD SKIN: Warm and dry EYES: No pallor or icterus ENT: MMM CV: RRR PULM: CTA B ABD: soft, obese, NT, +BS CNS: Sleepy but arousable and communicative, non focal EXT: Mild bilateral knee tenderness but no swelling or erythema      Data Reviewed:   I have personally reviewed following labs and imaging studies:  Labs: Labs show the following:   Basic Metabolic Panel: Recent Labs  Lab 12/16/22 1847 12/16/22 2302 12/17/22 1200 12/18/22 0935 12/19/22 0416  NA 133*  --  138 135 137  K 3.2*  --  3.1* 3.6 3.8  CL 96*  --  104 99 101  CO2 23  --  27  22 26   GLUCOSE 279*  --  190* 261* 204*  BUN 13  --  11 9 11   CREATININE 1.01* 1.28* 0.98 0.90 0.84  CALCIUM 9.4  --  8.2* 9.1 9.3  MG  --   --  1.7 2.2  --   PHOS  --   --  3.2  --   --    GFR Estimated Creatinine Clearance: 87.5 mL/min (by C-G formula based on SCr of 0.84 mg/dL). Liver Function Tests: Recent Labs  Lab 12/16/22 1847 12/17/22 1200  AST 24 22  ALT 17 14  ALKPHOS 79 71  BILITOT 2.0* 0.9  PROT 7.8 6.0*  ALBUMIN 3.9 2.9*   No results for input(s): "LIPASE", "AMYLASE" in the last 168 hours. No results for input(s): "AMMONIA" in the last 168 hours. Coagulation profile Recent Labs  Lab 12/16/22 1847 12/17/22 0714  INR 1.2 1.4*    CBC: Recent Labs  Lab 12/16/22 1847 12/16/22 2350 12/18/22 0935 12/19/22 0416  WBC 26.6* 26.7* 19.4* 16.4*  NEUTROABS 22.5*  --  16.5* 12.8*  HGB 11.7* 10.3* 10.7* 9.5*  HCT 36.6 31.7* 34.8* 29.9*  MCV 71.2* 71.2* 73.9* 71.0*  PLT 189 167 167 181   Cardiac Enzymes: No results for input(s): "CKTOTAL", "CKMB", "CKMBINDEX", "TROPONINI" in the last 168 hours. BNP (last 3 results) No results for input(s): "PROBNP" in the last 8760 hours. CBG: Recent Labs  Lab 12/18/22 0733 12/18/22 1140 12/18/22 1620 12/18/22 2139 12/19/22 0837  GLUCAP 179* 228* 251* 268* 187*   D-Dimer: No results for input(s): "DDIMER" in the last 72 hours. Hgb A1c: Recent Labs    12/16/22 1847  HGBA1C 7.1*   Lipid Profile: No results for input(s): "CHOL", "HDL", "LDLCALC", "TRIG", "CHOLHDL", "LDLDIRECT" in the last 72 hours. Thyroid function studies: No results for input(s): "TSH", "T4TOTAL", "T3FREE", "THYROIDAB" in the last 72 hours.  Invalid input(s): "FREET3" Anemia work up: No results for input(s): "VITAMINB12", "FOLATE", "FERRITIN", "TIBC", "IRON", "RETICCTPCT" in the last 72 hours. Sepsis Labs: Recent Labs  Lab 12/16/22 1847 12/16/22 2100 12/16/22 2350 12/17/22 0714 12/18/22 0321 12/18/22 0935 12/19/22 0416  PROCALCITON  --    --   --  37.64  --  24.37  --   WBC 26.6*  --  26.7*  --   --  19.4* 16.4*  LATICACIDVEN 2.3* 2.9*  --   --  1.1  --   --     Microbiology Recent Results (from the past 240 hour(s))  Resp panel by RT-PCR (RSV, Flu A&B, Covid) Anterior Nasal Swab     Status: None   Collection Time: 12/16/22  6:47 PM   Specimen: Anterior Nasal Swab  Result Value Ref Range Status   SARS Coronavirus 2 by RT PCR NEGATIVE NEGATIVE Final    Comment: (NOTE) SARS-CoV-2 target nucleic acids are NOT DETECTED.  The SARS-CoV-2 RNA is generally detectable in upper respiratory specimens during the acute phase of infection. The lowest concentration of SARS-CoV-2 viral copies this assay can detect is 138 copies/mL. A negative result does not preclude SARS-Cov-2 infection and should not be used as the sole basis for treatment or other patient management decisions. A negative result may occur with  improper specimen collection/handling, submission of specimen other than nasopharyngeal swab, presence of viral mutation(s) within the areas targeted by this assay, and inadequate number of viral copies(<138 copies/mL). A negative result must be combined with clinical observations, patient history, and epidemiological information. The expected result is Negative.  Fact Sheet for Patients:  BloggerCourse.com  Fact Sheet for Healthcare Providers:  SeriousBroker.it  This test is no t yet approved or cleared by the [place] FDA and  has been authorized for detection and/or diagnosis of SARS-CoV-2 by FDA under an Emergency Use Authorization (EUA). This EUA will remain  in effect (meaning this test can be used)  for the duration of the COVID-19 declaration under Section 564(b)(1) of the Act, 21 U.S.C.section 360bbb-3(b)(1), unless the authorization is terminated  or revoked sooner.       Influenza A by PCR NEGATIVE NEGATIVE Final   Influenza B by PCR NEGATIVE  NEGATIVE Final    Comment: (NOTE) The Xpert Xpress SARS-CoV-2/FLU/RSV plus assay is intended as an aid in the diagnosis of influenza from Nasopharyngeal swab specimens and should not be used as a sole basis for treatment. Nasal washings and aspirates are unacceptable for Xpert Xpress SARS-CoV-2/FLU/RSV testing.  Fact Sheet for Patients: BloggerCourse.com  Fact Sheet for Healthcare Providers: SeriousBroker.it  This test is not yet approved or cleared by the [place] FDA and has been authorized for detection and/or diagnosis of SARS-CoV-2 by FDA under an Emergency Use Authorization (EUA). This EUA will remain in effect (meaning this test can be used) for the duration of the COVID-19 declaration under Section 564(b)(1) of the Act, 21 U.S.C. section 360bbb-3(b)(1), unless the authorization is terminated or revoked.     Resp Syncytial Virus by PCR NEGATIVE NEGATIVE Final    Comment: (NOTE) Fact Sheet for Patients: BloggerCourse.com  Fact Sheet for Healthcare Providers: SeriousBroker.it  This test is not yet approved or cleared by the [place] FDA and has been authorized for detection and/or diagnosis of SARS-CoV-2 by FDA under an Emergency Use Authorization (EUA). This EUA will remain in effect (meaning this test can be used) for the duration of the COVID-19 declaration under Section 564(b)(1) of the Act, 21 U.S.C. section 360bbb-3(b)(1), unless the authorization is terminated or revoked.  Performed at [facility], [address] [place], [place] [address]   Culture, blood (Routine x 2)     Status: Abnormal   Collection Time: 12/16/22  6:59 PM   Specimen: BLOOD  Result Value Ref Range Status   Specimen Description BLOOD BLOOD LEFT WRIST  Final   Special Requests   Final    BOTTLES DRAWN AEROBIC AND ANAEROBIC Blood Culture adequate volume   Culture  Setup  Time   Final    GRAM NEGATIVE RODS IN BOTH AEROBIC AND ANAEROBIC BOTTLES CRITICAL RESULT CALLED TO, READ BACK BY AND VERIFIED WITH: EMILY STEINBOCK AT 6045 12/17/22.PMF    Culture (A)  Final    ESCHERICHIA COLI SUSCEPTIBILITIES PERFORMED ON PREVIOUS CULTURE WITHIN THE LAST 5 DAYS.    Report Status 12/19/2022 FINAL  Final  Urine Culture     Status: Abnormal   Collection Time: 12/16/22  6:59 PM   Specimen: Urine, Random  Result Value Ref Range Status   Specimen Description   Final    URINE, RANDOM Performed at [facility], [address] [place], [place] [address]    Special Requests   Final    NONE Reflexed from [id number] Performed at [facility], [address] [place], [place] [address]    Culture >=100,000 COLONIES/mL ESCHERICHIA COLI (A)  Final   Report Status 12/19/2022 FINAL  Final   Organism ID, Bacteria ESCHERICHIA COLI (A)  Final      Susceptibility   Escherichia coli - MIC*    AMPICILLIN >=32 RESISTANT Resistant     CEFAZOLIN <=4 SENSITIVE Sensitive     CEFEPIME <=0.12 SENSITIVE Sensitive     CEFTRIAXONE <=0.25 SENSITIVE Sensitive     CIPROFLOXACIN <=0.25 SENSITIVE Sensitive     GENTAMICIN <=1 SENSITIVE Sensitive     IMIPENEM <=0.25 SENSITIVE Sensitive     NITROFURANTOIN <=16 SENSITIVE Sensitive  TRIMETH/SULFA >=320 RESISTANT Resistant     AMPICILLIN/SULBACTAM 16 INTERMEDIATE Intermediate     PIP/TAZO <=4 SENSITIVE Sensitive ug/mL    * >=100,000 COLONIES/mL ESCHERICHIA COLI  Culture, blood (Routine x 2)     Status: Abnormal   Collection Time: 12/16/22  7:04 PM   Specimen: BLOOD  Result Value Ref Range Status   Specimen Description   Final    BLOOD LEFT ANTECUBITAL Performed at [facility], [address] [place], [place] [address]    Special Requests   Final    BOTTLES DRAWN AEROBIC AND ANAEROBIC Blood Culture results may not be optimal due to an excessive volume of blood received in culture bottles Performed at [facility], [address] [place], [place] [address]    Culture  Setup Time   Final    GRAM NEGATIVE RODS IN BOTH AEROBIC AND ANAEROBIC BOTTLES CRITICAL RESULT CALLED TO, READ BACK BY AND VERIFIED WITH: EMILY STEINBOCK AT 5621 12/17/22.PMF Performed at [facility], [address], [place], [place] [address]    Culture ESCHERICHIA COLI (A)  Final   Report Status 12/19/2022 FINAL  Final   Organism ID, Bacteria ESCHERICHIA COLI  Final   Organism ID, Bacteria ESCHERICHIA COLI  Final      Susceptibility   Escherichia coli - MIC*    AMPICILLIN >=32 RESISTANT Resistant     CEFEPIME <=0.12 SENSITIVE Sensitive     CEFTAZIDIME <=1 SENSITIVE Sensitive     CEFTRIAXONE <=0.25 SENSITIVE Sensitive     CIPROFLOXACIN <=0.25 SENSITIVE Sensitive     GENTAMICIN <=1 SENSITIVE Sensitive     IMIPENEM <=0.25 SENSITIVE Sensitive     TRIMETH/SULFA >=320 RESISTANT Resistant     AMPICILLIN/SULBACTAM 16 INTERMEDIATE Intermediate     PIP/TAZO <=4 SENSITIVE Sensitive ug/mL   Escherichia coli - KIRBY BAUER*    CEFAZOLIN RESISTANT Resistant     * ESCHERICHIA COLI    ESCHERICHIA COLI  Blood Culture ID Panel (Reflexed)     Status: Abnormal   Collection Time: 12/16/22  7:04 PM  Result Value Ref Range Status   Enterococcus faecalis NOT DETECTED NOT DETECTED Final   Enterococcus Faecium NOT DETECTED NOT DETECTED Final   Listeria monocytogenes NOT DETECTED NOT DETECTED Final   Staphylococcus species NOT DETECTED NOT DETECTED Final   Staphylococcus aureus (BCID) NOT DETECTED NOT DETECTED Final   Staphylococcus epidermidis NOT DETECTED NOT DETECTED Final   Staphylococcus lugdunensis NOT DETECTED NOT DETECTED Final   Streptococcus species NOT DETECTED NOT DETECTED Final   Streptococcus agalactiae NOT DETECTED NOT DETECTED Final   Streptococcus pneumoniae NOT DETECTED NOT DETECTED Final   Streptococcus pyogenes NOT DETECTED NOT DETECTED Final   A.calcoaceticus-baumannii NOT DETECTED NOT DETECTED Final    Bacteroides fragilis NOT DETECTED NOT DETECTED Final   Enterobacterales DETECTED (A) NOT DETECTED Final    Comment: Enterobacterales represent a large order of gram negative bacteria, not a single organism. CRITICAL RESULT CALLED TO, READ BACK BY AND VERIFIED WITH: EMILY STEINBOCK AT 7846 12/17/22.PMF    Enterobacter cloacae complex NOT DETECTED NOT DETECTED Final   Escherichia coli DETECTED (A) NOT DETECTED Final    Comment: CRITICAL RESULT CALLED TO, READ BACK BY AND VERIFIED WITH: EMILY STEINBOCK AT 9629 12/17/22.PMF    Klebsiella aerogenes NOT DETECTED NOT DETECTED Final   Klebsiella oxytoca NOT DETECTED NOT DETECTED Final   Klebsiella pneumoniae NOT DETECTED NOT DETECTED Final   Proteus species NOT DETECTED NOT DETECTED Final   Salmonella species NOT DETECTED NOT  DETECTED Final   Serratia marcescens NOT DETECTED NOT DETECTED Final   Haemophilus influenzae NOT DETECTED NOT DETECTED Final   Neisseria meningitidis NOT DETECTED NOT DETECTED Final   Pseudomonas aeruginosa NOT DETECTED NOT DETECTED Final   Stenotrophomonas maltophilia NOT DETECTED NOT DETECTED Final   Candida albicans NOT DETECTED NOT DETECTED Final   Candida auris NOT DETECTED NOT DETECTED Final   Candida glabrata NOT DETECTED NOT DETECTED Final   Candida krusei NOT DETECTED NOT DETECTED Final   Candida parapsilosis NOT DETECTED NOT DETECTED Final   Candida tropicalis NOT DETECTED NOT DETECTED Final   Cryptococcus neoformans/gattii NOT DETECTED NOT DETECTED Final   CTX-M ESBL NOT DETECTED NOT DETECTED Final   Carbapenem resistance IMP NOT DETECTED NOT DETECTED Final   Carbapenem resistance KPC NOT DETECTED NOT DETECTED Final   Carbapenem resistance NDM NOT DETECTED NOT DETECTED Final   Carbapenem resist OXA 48 LIKE NOT DETECTED NOT DETECTED Final   Carbapenem resistance VIM NOT DETECTED NOT DETECTED Final    Comment: Performed at [facility], [address] [place], [place] [address]    Procedures and  diagnostic studies:  No results found.             LOS: 3 days   Kiley Torrence  [facility]   Pager on [email or web address]. If 7PM-7AM, please contact night-coverage at [email or web address]     12/19/2022, 11:46 AM

## 2022-12-19 NOTE — Plan of Care (Signed)
Problem: Fluid Volume: Goal: Hemodynamic stability will improve Outcome: Progressing

## 2022-12-20 DIAGNOSIS — B9620 Unspecified Escherichia coli [E. coli] as the cause of diseases classified elsewhere: Secondary | ICD-10-CM | POA: Diagnosis not present

## 2022-12-20 DIAGNOSIS — R7881 Bacteremia: Secondary | ICD-10-CM

## 2022-12-20 DIAGNOSIS — R6520 Severe sepsis without septic shock: Secondary | ICD-10-CM | POA: Diagnosis not present

## 2022-12-20 LAB — CBC WITH DIFFERENTIAL/PLATELET
Abs Immature Granulocytes: 0.37 10*3/uL — ABNORMAL HIGH (ref 0.00–0.07)
Basophils Absolute: 0.1 10*3/uL (ref 0.0–0.1)
Basophils Relative: 1 %
Eosinophils Absolute: 0.3 10*3/uL (ref 0.0–0.5)
Eosinophils Relative: 3 %
HCT: 29.8 % — ABNORMAL LOW (ref 36.0–46.0)
Hemoglobin: 9.6 g/dL — ABNORMAL LOW (ref 12.0–15.0)
Immature Granulocytes: 3 %
Lymphocytes Relative: 13 %
Lymphs Abs: 1.5 10*3/uL (ref 0.7–4.0)
MCH: 22.7 pg — ABNORMAL LOW (ref 26.0–34.0)
MCHC: 32.2 g/dL (ref 30.0–36.0)
MCV: 70.4 fL — ABNORMAL LOW (ref 80.0–100.0)
Monocytes Absolute: 1.6 10*3/uL — ABNORMAL HIGH (ref 0.1–1.0)
Monocytes Relative: 14 %
Neutro Abs: 7.2 10*3/uL (ref 1.7–7.7)
Neutrophils Relative %: 66 %
Platelets: 230 10*3/uL (ref 150–400)
RBC: 4.23 MIL/uL (ref 3.87–5.11)
RDW: 15.4 % (ref 11.5–15.5)
WBC: 11 10*3/uL — ABNORMAL HIGH (ref 4.0–10.5)
nRBC: 0 % (ref 0.0–0.2)

## 2022-12-20 LAB — IRON AND TIBC
Iron: 49 ug/dL (ref 28–170)
Saturation Ratios: 23 % (ref 10.4–31.8)
TIBC: 213 ug/dL — ABNORMAL LOW (ref 250–450)
UIBC: 164 ug/dL

## 2022-12-20 LAB — FERRITIN: Ferritin: 257 ng/mL (ref 11–307)

## 2022-12-20 LAB — GLUCOSE, CAPILLARY: Glucose-Capillary: 177 mg/dL — ABNORMAL HIGH (ref 70–99)

## 2022-12-20 MED ORDER — LEVOFLOXACIN 500 MG PO TABS: 750.0000 mg | ORAL_TABLET | Freq: Every day | ORAL | Status: DC

## 2022-12-20 MED ORDER — PANTOPRAZOLE SODIUM 40 MG PO TBEC: 40.0000 mg | DELAYED_RELEASE_TABLET | Freq: Two times a day (BID) | ORAL | Status: DC

## 2022-12-20 MED ORDER — LEVOFLOXACIN 750 MG PO TABS: 750.0000 mg | ORAL_TABLET | Freq: Every day | ORAL | 0 refills | Status: AC

## 2022-12-20 MED ORDER — ENOXAPARIN SODIUM 60 MG/0.6ML IJ SOSY: 60.0000 mg | PREFILLED_SYRINGE | INTRAMUSCULAR | Status: DC

## 2022-12-20 NOTE — Plan of Care (Signed)
Problem: Clinical Measurements: Goal: Diagnostic test results will improve Outcome: Progressing

## 2022-12-20 NOTE — TOC Transition Note (Addendum)
Transition of Care [facility]) - CM/SW Discharge Note   Patient Details  Name: Christy [name] MRN: [id number] Date of Birth: October 18, 1955  Transition of Care [facility] CM/SW Contact:  Darolyn Rua, LCSW Phone Number: 12/20/2022, 12:23 PM   Clinical Narrative:     Patient to discharge home today to [facility],   CSW spoke with patient regarding discharge planning she reports her cousin is coming to bring her clothes, reports not having a ride back to [facility]  CSW spoke to tammy with  [facility] she reports they are able to transport today, requests call when patient is ready. Patient aware.   Patient is active with [facility], Morrie Sheldon with [facility] informed of dc today.   Final next level of care: Assisted Living Barriers to Discharge: No Barriers Identified   Patient Goals and CMS Choice CMS Medicare.gov Compare Post Acute Care list provided to:: Patient Choice offered to / list presented to : Patient  Discharge Placement                      Patient and family notified of of transfer: 12/20/22  Discharge Plan and Services Additional resources added to the After Visit Summary for                                       Social Determinants of Health (SDOH) Interventions SDOH Screenings   Food Insecurity: No Food Insecurity (12/17/2022)  Housing: Low Risk  (12/17/2022)  Transportation Needs: No Transportation Needs (12/17/2022)  Utilities: Not At Risk (12/17/2022)  Tobacco Use: High Risk (12/17/2022)     Readmission Risk Interventions     No data to display

## 2022-12-20 NOTE — Care Management Important Message (Signed)
Important Message  Patient Details  Name: LILIENNE HILBERG MRN: [id number] Date of Birth: Jun 23, 1955   Important Message Given:  Yes - Medicare IM     Olegario Messier A Daeja Helderman 12/20/2022, 2:11 PM

## 2022-12-20 NOTE — Progress Notes (Signed)
PHARMACIST - PHYSICIAN COMMUNICATION  DR:   Lurene Shadow  CONCERNING: IV to Oral Route Change Policy  RECOMMENDATION: This patient is receiving pantoprazole 40 mg BID by the intravenous route.  Based on criteria approved by the Pharmacy and Therapeutics Committee, the intravenous medication(s) is/are being converted to the equivalent oral dose form(s).   DESCRIPTION: These criteria include: The patient is eating (either orally or via tube) and/or has been taking other orally administered medications for a least 24 hours The patient has no evidence of active gastrointestinal bleeding or impaired GI absorption (gastrectomy, short bowel, patient on TNA or NPO).  If you have questions about this conversion, please contact the Pharmacy Department  []   [phone] )  [facility] [x]   [phone] )  [facility] []   [phone] )  [facility] []   [phone] )  [facility] []   [phone] )  Ilene Qua   Effie Shy, PharmD Pharmacy Resident  12/20/2022 9:59 AM

## 2022-12-20 NOTE — Progress Notes (Signed)
Spoke with [name], poa. Informed her of patients discharge back to [facility] today.

## 2022-12-20 NOTE — Discharge Summary (Signed)
Physician Discharge Summary   Patient: Christy [name] MRN: [id number] DOB: 09-24-1955  Admit date:     12/16/2022  Discharge date: 12/20/22  Discharge Physician: Lurene Shadow   PCP: Ellan Lambert, NP   Recommendations at discharge:   Follow-up with PCP in 1 week  Discharge Diagnoses: Principal Problem:   Severe sepsis (HCC) Active Problems:   E coli bacteremia   Urinary tract infection   Acute metabolic encephalopathy   Current tobacco use   Hypertension   Controlled type 2 diabetes mellitus without complication, without long-term current use of insulin (HCC)   Anemia   Electrolyte abnormality   AKI (acute kidney injury) (HCC)  Resolved Problems:   * No resolved hospital problems. *  Hospital Course:  Christy [name] is a 67 y.o. 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 In the ED, she was febrile with a temperature of 103.3 F, tachycardic with heart rate of 137, tachypnea with heart rate in the 30s but she was hypertensive.  Initial lactic acid was 2.3 and it went up to 2.9.  WBC was 26.6.  Procalcitonin was 37.64.   She was found to have severe sepsis secondary to acute UTI and E. coli bacteremia.      Assessment and Plan:   Severe sepsis secondary to acute E. coli UTI and E. coli bacteremia: Fever has subsided.  Previously treated with IV ceftriaxone and meropenem.  She will be discharged on oral Levaquin for 2 more days to complete 7 days of treatment.      Hypokalemia: Improved     AKI: Improved     Acute metabolic encephalopathy: Improved     Type 2 diabetes mellitus: Resume glimepiride and metformin at discharge.      Hypertension:  Continue  Toprol-XL, losartan and Lasix.       History of dysphagia noted on chart review: Speech therapist recommended dysphagia 3 diet.       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     Her condition is improved and she is deemed stable for discharge today.  She was very appreciative of the care she received in the hospital.       Consultants: None Procedures performed: None  Disposition: Assisted living Diet recommendation:  Discharge Diet Orders (From admission, onward)     Start     Ordered   12/20/22 0000  DIET DYS 3       Comments: Carb modified diet  Question:  Fluid consistency:  Answer:  Thin   12/20/22 1210           Dysphagia type 3 thin Liquid, carb modified diet DISCHARGE MEDICATION: Allergies as of 12/20/2022       Reactions   Benzoyl Peroxide  Cephalexin Itching   Other Diarrhea   Milk containing products (dairy) causes diarrhea   Peroxide [hydrogen Peroxide] Swelling   Shellfish Allergy Nausea And Vomiting        Medication List     STOP taking these medications    valACYclovir 1000 MG tablet Commonly known as: VALTREX       TAKE these medications    accu-chek softclix lancets Check sugar once daily, DX E11.9   Accu-Chek Softclix Lancets lancets Check sugar once daily  DX E11.9   acetaminophen 500 MG tablet Commonly known as: TYLENOL Take 2 tablets by mouth in the  morning, at noon, and at bedtime.   aspirin EC 81 MG tablet Take 1 tablet by mouth daily.   cyanocobalamin 1000 MCG tablet Commonly known as: VITAMIN B12 Take 1,000 mcg by mouth daily.   dicyclomine 10 MG capsule Commonly known as: BENTYL Take 1 capsule (10 mg total) by mouth 2 (two) times daily.   diphenoxylate-atropine 2.5-0.025 MG tablet Commonly known as: LOMOTIL Take 1 tablet by mouth 2 (two) times daily as needed for diarrhea or loose stools.   FLUPHENAZINE DECANOATE IJ Inject 25 mg into the muscle every 21 ( twenty-one) days.   furosemide 40 MG tablet Commonly known as: LASIX Take 1 tablet (40 mg total) by mouth 2 (two) times daily.   glimepiride 4 MG tablet Commonly known as: AMARYL Two tablets daily with breakfast   glucose blood test strip Commonly known as: Accu-Chek Aviva Check sugar once daily DX E11.9   Icy Hot Original Pain Relief 2.5 % Gel Generic drug: Menthol (Topical Analgesic) Apply 1 application topically in the morning, at noon, and at bedtime. To both knees   levofloxacin 750 MG tablet Commonly known as: LEVAQUIN Take 1 tablet (750 mg total) by mouth daily for 2 days. Start taking on: December 21, 2022   loratadine 10 MG tablet Commonly known as: CLARITIN Take 1 tablet (10 mg total) by mouth daily.   losartan 50 MG tablet Commonly known as: COZAAR Take 1 tablet (50 mg total) by mouth daily.   metFORMIN 1000 MG tablet Commonly known as: GLUCOPHAGE Take 1 tablet (1,000 mg total) by mouth 2 (two) times daily with a meal.   metoprolol succinate 25 MG 24 hr tablet Commonly known as: TOPROL-XL Take 0.5 tablets (12.5 mg total) by mouth daily.   Multi-Vitamin Gummies Chew Chew 2 tablets by mouth daily at 12 noon.   olopatadine 0.1 % ophthalmic solution Commonly known as: PATANOL Place 1 drop into both eyes daily at 12 noon.   ondansetron 4 MG disintegrating tablet Commonly known as: ZOFRAN-ODT Take 1 tablet (4 mg total) by mouth every 8  (eight) hours as needed for nausea or vomiting.   oxybutynin 5 MG tablet Commonly known as: DITROPAN Take 1 tablet by mouth in the morning, at noon, and at bedtime.   pantoprazole 40 MG tablet Commonly known as: PROTONIX Take 40 mg by mouth daily.   potassium chloride 10 MEQ tablet Commonly known as: KLOR-CON Take 1 tablet by mouth daily.   simvastatin 10 MG tablet Commonly known as: ZOCOR Take 1 tablet (10 mg total) by mouth daily.   traMADol 50 MG tablet Commonly known as: ULTRAM Take 1 tablet by mouth in the morning, at noon, and at bedtime.  Give 1 tablet q 12h prn breakthrough pain. Hold for lethargy   trihexyphenidyl 2 MG tablet Commonly known as: ARTANE Take 1 tablet by mouth daily.  Discharge Exam: Filed Weights   12/18/22 0942  Weight: 126.9 kg   GEN: NAD SKIN: Warm and dry EYES: No pallor or icterus ENT: MMM CV: RRR PULM: CTA B ABD: soft, obese, NT, +BS CNS: AAO x 3, non focal, resting tremors of bilateral hands EXT: No edema or tenderness   Condition at discharge: good  The results of significant diagnostics from this hospitalization (including imaging, microbiology, ancillary and laboratory) are listed below for reference.   Imaging Studies: CT Angio Chest PE W/Cm &/Or Wo Cm  Result Date: 12/16/2022 CLINICAL DATA:  Fevers and possible sepsis, initial encounter EXAM: CT ANGIOGRAPHY CHEST WITH CONTRAST TECHNIQUE: Multidetector CT imaging of the chest was performed using the standard protocol during bolus administration of intravenous contrast. Multiplanar CT image reconstructions and MIPs were obtained to evaluate the vascular anatomy. RADIATION DOSE REDUCTION: This exam was performed according to the departmental dose-optimization program which includes automated exposure control, adjustment of the mA and/or kV according to patient size and/or use of iterative reconstruction technique. CONTRAST:  OMNIPAQUE IOHEXOL 350 MG/ML SOLN COMPARISON:   Chest x-ray from earlier in the same day. FINDINGS: Cardiovascular: Thoracic aorta is within normal limits. No cardiac enlargement is noted. The pulmonary artery shows a normal branching pattern bilaterally. No definitive filling defect to suggest pulmonary embolism is noted. No significant coronary calcifications are noted. Mediastinum/Nodes: Thoracic inlet is within normal limits. No hilar or mediastinal adenopathy is noted. The esophagus as visualized is within normal limits. Lungs/Pleura: Lungs are well aerated bilaterally. No focal infiltrate or effusion is seen. Upper Abdomen: Visualized upper abdomen is unremarkable. Musculoskeletal: Bony structures appear within normal limits. Review of the MIP images confirms the above findings. IMPRESSION: No evidence of pulmonary emboli. No acute abnormality seen. Electronically Signed   By: Alcide Clever M.D.   On: 12/16/2022 21:50   DG Chest 1 View  Result Date: 12/16/2022 CLINICAL DATA:  Sepsis, altered mental status EXAM: CHEST  1 VIEW COMPARISON:  07/20/2022 FINDINGS: Lungs are clear. Heart size and mediastinal contours are within normal limits. No effusion. Visualized bones unremarkable. IMPRESSION: No acute cardiopulmonary disease. Electronically Signed   By: Corlis Leak M.D.   On: 12/16/2022 19:56    Microbiology: Results for orders placed or performed during the hospital encounter of 12/16/22  Resp panel by RT-PCR (RSV, Flu A&B, Covid) Anterior Nasal Swab     Status: None   Collection Time: 12/16/22  6:47 PM   Specimen: Anterior Nasal Swab  Result Value Ref Range Status   SARS Coronavirus 2 by RT PCR NEGATIVE NEGATIVE Final    Comment: (NOTE) SARS-CoV-2 target nucleic acids are NOT DETECTED.  The SARS-CoV-2 RNA is generally detectable in upper respiratory specimens during the acute phase of infection. The lowest concentration of SARS-CoV-2 viral copies this assay can detect is 138 copies/mL. A negative result does not preclude  SARS-Cov-2 infection and should not be used as the sole basis for treatment or other patient management decisions. A negative result may occur with  improper specimen collection/handling, submission of specimen other than nasopharyngeal swab, presence of viral mutation(s) within the areas targeted by this assay, and inadequate number of viral copies(<138 copies/mL). A negative result must be combined with clinical observations, patient history, and epidemiological information. The expected result is Negative.  Fact Sheet for Patients:  BloggerCourse.com  Fact Sheet for Healthcare Providers:  SeriousBroker.it  This test is no t yet approved or cleared by the [place] and  has been authorized for  detection and/or diagnosis of SARS-CoV-2 by FDA under an Emergency Use Authorization (EUA). This EUA will remain  in effect (meaning this test can be used) for the duration of the COVID-19 declaration under Section 564(b)(1) of the Act, 21 U.S.C.section 360bbb-3(b)(1), unless the authorization is terminated  or revoked sooner.       Influenza A by PCR NEGATIVE NEGATIVE Final   Influenza B by PCR NEGATIVE NEGATIVE Final    Comment: (NOTE) The Xpert Xpress SARS-CoV-2/FLU/RSV plus assay is intended as an aid in the diagnosis of influenza from Nasopharyngeal swab specimens and should not be used as a sole basis for treatment. Nasal washings and aspirates are unacceptable for Xpert Xpress SARS-CoV-2/FLU/RSV testing.  Fact Sheet for Patients: BloggerCourse.com  Fact Sheet for Healthcare Providers: SeriousBroker.it  This test is not yet approved or cleared by the [place] FDA and has been authorized for detection and/or diagnosis of SARS-CoV-2 by FDA under an Emergency Use Authorization (EUA). This EUA will remain in effect (meaning this test can be used) for the duration of  the COVID-19 declaration under Section 564(b)(1) of the Act, 21 U.S.C. section 360bbb-3(b)(1), unless the authorization is terminated or revoked.     Resp Syncytial Virus by PCR NEGATIVE NEGATIVE Final    Comment: (NOTE) Fact Sheet for Patients: BloggerCourse.com  Fact Sheet for Healthcare Providers: SeriousBroker.it  This test is not yet approved or cleared by the [place] FDA and has been authorized for detection and/or diagnosis of SARS-CoV-2 by FDA under an Emergency Use Authorization (EUA). This EUA will remain in effect (meaning this test can be used) for the duration of the COVID-19 declaration under Section 564(b)(1) of the Act, 21 U.S.C. section 360bbb-3(b)(1), unless the authorization is terminated or revoked.  Performed at [facility], [address] [place], [place] [address]   Culture, blood (Routine x 2)     Status: Abnormal   Collection Time: 12/16/22  6:59 PM   Specimen: BLOOD  Result Value Ref Range Status   Specimen Description BLOOD BLOOD LEFT WRIST  Final   Special Requests   Final    BOTTLES DRAWN AEROBIC AND ANAEROBIC Blood Culture adequate volume   Culture  Setup Time   Final    GRAM NEGATIVE RODS IN BOTH AEROBIC AND ANAEROBIC BOTTLES CRITICAL RESULT CALLED TO, READ BACK BY AND VERIFIED WITH: EMILY STEINBOCK AT 0981 12/17/22.PMF    Culture (A)  Final    ESCHERICHIA COLI SUSCEPTIBILITIES PERFORMED ON PREVIOUS CULTURE WITHIN THE LAST 5 DAYS.    Report Status 12/19/2022 FINAL  Final  Urine Culture     Status: Abnormal   Collection Time: 12/16/22  6:59 PM   Specimen: Urine, Random  Result Value Ref Range Status   Specimen Description   Final    URINE, RANDOM Performed at [facility], [address] [place], [place] [address]    Special Requests   Final    NONE Reflexed from [id number] Performed at [facility], [address] [place], [place] [address]     Culture >=100,000 COLONIES/mL ESCHERICHIA COLI (A)  Final   Report Status 12/19/2022 FINAL  Final   Organism ID, Bacteria ESCHERICHIA COLI (A)  Final      Susceptibility   Escherichia coli - MIC*    AMPICILLIN >=32 RESISTANT Resistant     CEFAZOLIN <=4 SENSITIVE Sensitive     CEFEPIME <=0.12 SENSITIVE Sensitive     CEFTRIAXONE <=0.25 SENSITIVE Sensitive     CIPROFLOXACIN <=0.25 SENSITIVE Sensitive  GENTAMICIN <=1 SENSITIVE Sensitive     IMIPENEM <=0.25 SENSITIVE Sensitive     NITROFURANTOIN <=16 SENSITIVE Sensitive     TRIMETH/SULFA >=320 RESISTANT Resistant     AMPICILLIN/SULBACTAM 16 INTERMEDIATE Intermediate     PIP/TAZO <=4 SENSITIVE Sensitive ug/mL    * >=100,000 COLONIES/mL ESCHERICHIA COLI  Culture, blood (Routine x 2)     Status: Abnormal   Collection Time: 12/16/22  7:04 PM   Specimen: BLOOD  Result Value Ref Range Status   Specimen Description   Final    BLOOD LEFT ANTECUBITAL Performed at [facility], [address] [place], [place] [address]    Special Requests   Final    BOTTLES DRAWN AEROBIC AND ANAEROBIC Blood Culture results may not be optimal due to an excessive volume of blood received in culture bottles Performed at [facility], [address] [place], [place] [address]    Culture  Setup Time   Final    GRAM NEGATIVE RODS IN BOTH AEROBIC AND ANAEROBIC BOTTLES CRITICAL RESULT CALLED TO, READ BACK BY AND VERIFIED WITH: EMILY STEINBOCK AT 1914 12/17/22.PMF Performed at [facility], [address], [place], [place] [address]    Culture ESCHERICHIA COLI (A)  Final   Report Status 12/19/2022 FINAL  Final   Organism ID, Bacteria ESCHERICHIA COLI  Final   Organism ID, Bacteria ESCHERICHIA COLI  Final      Susceptibility   Escherichia coli - MIC*    AMPICILLIN >=32 RESISTANT Resistant     CEFEPIME <=0.12 SENSITIVE Sensitive     CEFTAZIDIME <=1 SENSITIVE Sensitive     CEFTRIAXONE <=0.25 SENSITIVE Sensitive     CIPROFLOXACIN  <=0.25 SENSITIVE Sensitive     GENTAMICIN <=1 SENSITIVE Sensitive     IMIPENEM <=0.25 SENSITIVE Sensitive     TRIMETH/SULFA >=320 RESISTANT Resistant     AMPICILLIN/SULBACTAM 16 INTERMEDIATE Intermediate     PIP/TAZO <=4 SENSITIVE Sensitive ug/mL   Escherichia coli - KIRBY BAUER*    CEFAZOLIN RESISTANT Resistant     * ESCHERICHIA COLI    ESCHERICHIA COLI  Blood Culture ID Panel (Reflexed)     Status: Abnormal   Collection Time: 12/16/22  7:04 PM  Result Value Ref Range Status   Enterococcus faecalis NOT DETECTED NOT DETECTED Final   Enterococcus Faecium NOT DETECTED NOT DETECTED Final   Listeria monocytogenes NOT DETECTED NOT DETECTED Final   Staphylococcus species NOT DETECTED NOT DETECTED Final   Staphylococcus aureus (BCID) NOT DETECTED NOT DETECTED Final   Staphylococcus epidermidis NOT DETECTED NOT DETECTED Final   Staphylococcus lugdunensis NOT DETECTED NOT DETECTED Final   Streptococcus species NOT DETECTED NOT DETECTED Final   Streptococcus agalactiae NOT DETECTED NOT DETECTED Final   Streptococcus pneumoniae NOT DETECTED NOT DETECTED Final   Streptococcus pyogenes NOT DETECTED NOT DETECTED Final   A.calcoaceticus-baumannii NOT DETECTED NOT DETECTED Final   Bacteroides fragilis NOT DETECTED NOT DETECTED Final   Enterobacterales DETECTED (A) NOT DETECTED Final    Comment: Enterobacterales represent a large order of gram negative bacteria, not a single organism. CRITICAL RESULT CALLED TO, READ BACK BY AND VERIFIED WITH: EMILY STEINBOCK AT 6213 12/17/22.PMF    Enterobacter cloacae complex NOT DETECTED NOT DETECTED Final   Escherichia coli DETECTED (A) NOT DETECTED Final    Comment: CRITICAL RESULT CALLED TO, READ BACK BY AND VERIFIED WITH: EMILY STEINBOCK AT 0865 12/17/22.PMF    Klebsiella aerogenes NOT DETECTED NOT DETECTED Final   Klebsiella oxytoca NOT DETECTED NOT DETECTED Final  Klebsiella pneumoniae NOT DETECTED NOT DETECTED Final   Proteus species NOT DETECTED NOT  DETECTED Final   Salmonella species NOT DETECTED NOT DETECTED Final   Serratia marcescens NOT DETECTED NOT DETECTED Final   Haemophilus influenzae NOT DETECTED NOT DETECTED Final   Neisseria meningitidis NOT DETECTED NOT DETECTED Final   Pseudomonas aeruginosa NOT DETECTED NOT DETECTED Final   Stenotrophomonas maltophilia NOT DETECTED NOT DETECTED Final   Candida albicans NOT DETECTED NOT DETECTED Final   Candida auris NOT DETECTED NOT DETECTED Final   Candida glabrata NOT DETECTED NOT DETECTED Final   Candida krusei NOT DETECTED NOT DETECTED Final   Candida parapsilosis NOT DETECTED NOT DETECTED Final   Candida tropicalis NOT DETECTED NOT DETECTED Final   Cryptococcus neoformans/gattii NOT DETECTED NOT DETECTED Final   CTX-M ESBL NOT DETECTED NOT DETECTED Final   Carbapenem resistance IMP NOT DETECTED NOT DETECTED Final   Carbapenem resistance KPC NOT DETECTED NOT DETECTED Final   Carbapenem resistance NDM NOT DETECTED NOT DETECTED Final   Carbapenem resist OXA 48 LIKE NOT DETECTED NOT DETECTED Final   Carbapenem resistance VIM NOT DETECTED NOT DETECTED Final    Comment: Performed at [facility], [address] [place], [place] [address]    Labs: CBC: Recent Labs  Lab 12/16/22 1847 12/16/22 2350 12/18/22 0935 12/19/22 0416 12/20/22 0339  WBC 26.6* 26.7* 19.4* 16.4* 11.0*  NEUTROABS 22.5*  --  16.5* 12.8* 7.2  HGB 11.7* 10.3* 10.7* 9.5* 9.6*  HCT 36.6 31.7* 34.8* 29.9* 29.8*  MCV 71.2* 71.2* 73.9* 71.0* 70.4*  PLT 189 167 167 181 230   Basic Metabolic Panel: Recent Labs  Lab 12/16/22 1847 12/16/22 2302 12/17/22 1200 12/18/22 0935 12/19/22 0416  NA 133*  --  138 135 137  K 3.2*  --  3.1* 3.6 3.8  CL 96*  --  104 99 101  CO2 23  --  27 22 26   GLUCOSE 279*  --  190* 261* 204*  BUN 13  --  11 9 11   CREATININE 1.01* 1.28* 0.98 0.90 0.84  CALCIUM 9.4  --  8.2* 9.1 9.3  MG  --   --  1.7 2.2  --   PHOS  --   --  3.2  --   --    Liver Function  Tests: Recent Labs  Lab 12/16/22 1847 12/17/22 1200  AST 24 22  ALT 17 14  ALKPHOS 79 71  BILITOT 2.0* 0.9  PROT 7.8 6.0*  ALBUMIN 3.9 2.9*   CBG: Recent Labs  Lab 12/19/22 0837 12/19/22 1209 12/19/22 1637 12/19/22 2121 12/20/22 0859  GLUCAP 187* 169* 273* 198* 177*    Discharge time spent: greater than 30 minutes.  Signed: Lurene Shadow, MD [facility] 12/20/2022

## 2022-12-20 NOTE — Progress Notes (Signed)
IV team consult placed for PIV access.  This RN to bedside x 3, first interaction pt requesting to finish breakfast first. 2nd interaction while assessing pt inquiring about the need for PIV access due to her understanding she is being discharged today and no IV meds. This Rn spoke to primary RN, who did not confirm pt will be discharged. 3rd interaction, pt stating the doctor said she did not need an IV. Pt pleasant and with no IV meds ordered at this time. Secure chat to RN to please re-consult after speaking with patient and patient agreeable for IV start.

## 2022-12-20 NOTE — Progress Notes (Addendum)
Mobility Specialist - Progress Note   12/20/22 1200  Mobility  Activity Ambulated with assistance in hallway  Level of Assistance Contact guard assist, steadying assist  Assistive Device Front wheel walker  Distance Ambulated (ft) 200 ft  Range of Motion/Exercises Active;Right leg;Left leg  Activity Response Tolerated well  $Mobility charge 1 Mobility     Pt sitting in recliner upon arrival, utilizing RA. Pt pleasantly agreed to activity. Completed STS x3 from chair with maxA +2 and extra time. Once standing, pt able to shift weight anteriorly and ambulate in hallway with CGA. 2 seated rest breaks while completing lap around nurses station. VC to stay close to RW. Pt reports feeling weak in BLE but no other complaints. Pt left in chair with alarm set, needs in reach.    Christy [name] Mobility Specialist 12/20/22, 12:21 PM

## 2022-12-21 LAB — VITAMIN B12: Vitamin B-12: 664 pg/mL (ref 180–914)

## 2023-01-16 ENCOUNTER — Emergency Department
Admission: EM | Admit: 2023-01-16 | Discharge: 2023-01-16 | Disposition: A | Discharge status: Skilled Nursing Facility | Payer: Medicare Other | Attending: Emergency Medicine | Admitting: Emergency Medicine

## 2023-01-16 ENCOUNTER — Other Ambulatory Visit: Payer: Self-pay

## 2023-01-16 ENCOUNTER — Encounter: Payer: Self-pay | Admitting: Emergency Medicine

## 2023-01-16 ENCOUNTER — Emergency Department: Payer: Medicare Other

## 2023-01-16 DIAGNOSIS — E119 Type 2 diabetes mellitus without complications: Secondary | ICD-10-CM | POA: Insufficient documentation

## 2023-01-16 DIAGNOSIS — I10 Essential (primary) hypertension: Secondary | ICD-10-CM | POA: Insufficient documentation

## 2023-01-16 DIAGNOSIS — R0789 Other chest pain: Secondary | ICD-10-CM | POA: Diagnosis not present

## 2023-01-16 DIAGNOSIS — J45909 Unspecified asthma, uncomplicated: Secondary | ICD-10-CM | POA: Diagnosis not present

## 2023-01-16 DIAGNOSIS — R079 Chest pain, unspecified: Secondary | ICD-10-CM

## 2023-01-16 LAB — TROPONIN I (HIGH SENSITIVITY)
Troponin I (High Sensitivity): 5 ng/L (ref <18)
Troponin I (High Sensitivity): 5 ng/L (ref <18)

## 2023-01-16 LAB — BASIC METABOLIC PANEL
Anion gap: 9 (ref 5–15)
BUN: 7 mg/dL — ABNORMAL LOW (ref 8–23)
CO2: 31 mmol/L (ref 22–32)
Calcium: 9.6 mg/dL (ref 8.9–10.3)
Chloride: 97 mmol/L — ABNORMAL LOW (ref 98–111)
Creatinine, Ser: 0.75 mg/dL (ref 0.44–1.00)
GFR, Estimated: >60 mL/min (ref >60)
Glucose, Bld: 141 mg/dL — ABNORMAL HIGH (ref 70–99)
Potassium: 3.3 mmol/L — ABNORMAL LOW (ref 3.5–5.1)
Sodium: 137 mmol/L (ref 135–145)

## 2023-01-16 LAB — CBC
HCT: 37.1 % (ref 36.0–46.0)
Hemoglobin: 11.2 g/dL — ABNORMAL LOW (ref 12.0–15.0)
MCH: 22.7 pg — ABNORMAL LOW (ref 26.0–34.0)
MCHC: 30.2 g/dL (ref 30.0–36.0)
MCV: 75.3 fL — ABNORMAL LOW (ref 80.0–100.0)
Platelets: 264 10*3/uL (ref 150–400)
RBC: 4.93 MIL/uL (ref 3.87–5.11)
RDW: 16.7 % — ABNORMAL HIGH (ref 11.5–15.5)
WBC: 8.8 10*3/uL (ref 4.0–10.5)
nRBC: 0 % (ref 0.0–0.2)

## 2023-01-16 MED ORDER — ACETAMINOPHEN 325 MG PO TABS: 650.0000 mg | ORAL_TABLET | Freq: Once | ORAL | Status: DC

## 2023-01-16 MED ORDER — ASPIRIN 81 MG PO CHEW
324.0000 mg | CHEWABLE_TABLET | Freq: Once | ORAL | Status: AC
  Administered 2023-01-16: 324 mg via ORAL
  Filled 2023-01-16: qty 4

## 2023-01-16 NOTE — ED Triage Notes (Signed)
Pt arrived via EMS from [facility] with Chest pain 10/10 that started this AM. Pt sts that she previously had this before. Pt did have an aspirin this morning while having chest pain. Pt sts that the pain is steady. Per EMS, VS WNL.

## 2023-01-16 NOTE — ED Notes (Signed)
RN notified pt POA, [name] of pt departure from the ED back to [facility] due to [facility] not answering the phone.

## 2023-01-16 NOTE — ED Provider Notes (Signed)
[facility] Provider Note    Event Date/Time   First MD Initiated Contact with Patient 01/16/23 1148     (approximate)   History   Chest Pain   HPI  Christy [name] is a 67 y.o. 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  The patient denies any dizziness currently.  She has no leg swelling.  She states that she has had episodes of pain like this before.  She has no cough or fever.  Reviewed the past medical records.  The patient was admitted last month with altered mental status and was found to be septic due to an acute UTI and E. coli bacteremia.   Physical Exam   Triage Vital Signs: ED Triage Vitals  Encounter Vitals Group     BP 01/16/23 1124 130/64     Systolic BP Percentile --      Diastolic BP Percentile --      Pulse Rate 01/16/23 1124 71     Resp 01/16/23 1124 18     Temp 01/16/23 1124 98.3 F (36.8 C)     Temp src --      SpO2 01/16/23 1124 96 %     Weight 01/16/23 1121 282 lb (127.9 kg)     Height 01/16/23 1121 5\' 6"  (1.676 m)     Head Circumference --      Peak Flow --      Pain Score 01/16/23 1121 (S) 10     Pain Loc --      Pain Education --      Exclude from Growth Chart --     Most recent vital signs: Vitals:   01/16/23 1124 01/16/23 1602  BP: 130/64 (!) 145/81  Pulse: 71 73  Resp: 18 17  Temp: 98.3 F (36.8 C) 98.1 F (36.7 C)  SpO2: 96% 98%    General: Awake, well-appearing, no distress.  CV:  Good peripheral perfusion.  Resp:  Normal effort.  Lungs CTAB. Abd:  No distention.  Other:  Mild chest wall tenderness reproducing the pain.  No calf or popliteal edema or tenderness.   ED Results / Procedures / Treatments   Labs (all labs  ordered are listed, but only abnormal results are displayed) Labs Reviewed  BASIC METABOLIC PANEL - Abnormal; Notable for the following components:      Result Value   Potassium 3.3 (*)    Chloride 97 (*)    Glucose, Bld 141 (*)    BUN 7 (*)    All other components within normal limits  CBC - Abnormal; Notable for the following components:   Hemoglobin 11.2 (*)    MCV 75.3 (*)    MCH 22.7 (*)    RDW 16.7 (*)    All other components within normal limits  TROPONIN I (HIGH SENSITIVITY)  TROPONIN I (HIGH SENSITIVITY)     EKG  ED ECG REPORT I, Dionne Bucy, the attending physician, personally viewed and interpreted this ECG.  Date: 01/16/2023 EKG Time: 1123 Rate: 74 Rhythm: normal sinus rhythm QRS Axis: normal Intervals: normal ST/T Wave abnormalities: normal Narrative Interpretation: no evidence of acute ischemia    RADIOLOGY  Chest x-ray: I independently viewed and interpreted the images; there is no focal consolidation or edema   PROCEDURES:  Critical Care performed: No  Procedures   MEDICATIONS ORDERED IN ED: Medications  aspirin chewable tablet 324 mg (324 mg Oral Given 01/16/23 1313)     IMPRESSION / MDM / ASSESSMENT AND PLAN / ED COURSE  I reviewed the triage vital signs and the nursing notes.  67 year old female with PMH as noted above presents with somewhat atypical, nonexertional chest pain that started this morning in which she has experienced previously.  On exam her vital signs are normal and the patient is overall well-appearing.  There is some reproducible tenderness on exam.  EKG is nonischemic.  Chest x-ray shows no acute findings.  Differential diagnosis includes, but is not limited to, costochondritis or other musculoskeletal chest wall pain, GERD, less likely ACS.  Although I cannot rule out the patient for PE by [facility] due to her age, she has no specific risk factors or DVT symptoms.  Given the lack of tachycardia or hypoxia I have a low  suspicion for PE.  There is no clinical evidence for aortic dissection or other vascular etiology.  We will obtain cardiac enzymes, basic labs, and reassess.  Patient's presentation is most consistent with acute complicated illness / injury requiring diagnostic workup.  ----------------------------------------- 4:12 PM on 01/16/2023 -----------------------------------------  BMP shows no significant abnormalities.  CBC is also within normal limits.  Troponins are negative x 2.  The patient states that her pain has almost completely resolved.  She is stable for discharge at this time.  I counseled her on the results of the workup and plan of care.  I also discussed with her family members.  I gave strict return precautions and she expressed understanding.   FINAL CLINICAL IMPRESSION(S) / ED DIAGNOSES   Final diagnoses:  Nonspecific chest pain     Rx / DC Orders   ED Discharge Orders     None        Note:  This document was prepared using Dragon voice recognition software and may include unintentional dictation errors.    Dionne Bucy, MD 01/16/23 [phone]

## 2023-01-16 NOTE — ED Notes (Signed)
[facility] called for transport back to [facility] on [address], spoke with [employer].

## 2023-08-06 ENCOUNTER — Emergency Department

## 2023-08-06 ENCOUNTER — Other Ambulatory Visit: Payer: Self-pay

## 2023-08-06 ENCOUNTER — Emergency Department
Admission: EM | Admit: 2023-08-06 | Discharge: 2023-08-06 | Disposition: A | Discharge status: Home / Self Care | Attending: Emergency Medicine | Admitting: Emergency Medicine

## 2023-08-06 DIAGNOSIS — M25562 Pain in left knee: Secondary | ICD-10-CM | POA: Diagnosis not present

## 2023-08-06 DIAGNOSIS — S8001XA Contusion of right knee, initial encounter: Secondary | ICD-10-CM | POA: Diagnosis not present

## 2023-08-06 DIAGNOSIS — M545 Low back pain, unspecified: Secondary | ICD-10-CM | POA: Diagnosis not present

## 2023-08-06 DIAGNOSIS — M25561 Pain in right knee: Secondary | ICD-10-CM

## 2023-08-06 DIAGNOSIS — W08XXXA Fall from other furniture, initial encounter: Secondary | ICD-10-CM | POA: Diagnosis not present

## 2023-08-06 DIAGNOSIS — W19XXXA Unspecified fall, initial encounter: Secondary | ICD-10-CM

## 2023-08-06 MED ORDER — TRAMADOL HCL 50 MG PO TABS
50.0000 mg | ORAL_TABLET | ORAL | Status: AC
  Administered 2023-08-06: 50 mg via ORAL
  Filled 2023-08-06: qty 1

## 2023-08-06 MED ORDER — ACETAMINOPHEN 500 MG PO TABS
1000.0000 mg | ORAL_TABLET | ORAL | Status: AC
  Administered 2023-08-06: 1000 mg via ORAL
  Filled 2023-08-06: qty 2

## 2023-08-06 NOTE — ED Notes (Signed)
 Called x2 to give report and secure a ride for pt. No answer at this time.

## 2023-08-06 NOTE — ED Triage Notes (Signed)
 See first nurse note. Pt from [facility], [facility]. Pt complains of lower back pain and bilateral knee pain after sliding onto floor onto buttocks from couch this morning. States this happened because her knees buckled.

## 2023-08-06 NOTE — ED Notes (Signed)
 Patient is resting on stretcher, awakens easily.

## 2023-08-06 NOTE — ED Notes (Signed)
 Patient is out of the room and in imaging.

## 2023-08-06 NOTE — ED Notes (Signed)
 Attempted to call another number Jon, patient's family member, gave and it was answered and then hung up.

## 2023-08-06 NOTE — ED Notes (Signed)
 Spoke with POA at this time Jon. Discussed POC and pending d/c back to facility. Jon would like to be notified once pt leaves [facility] ED back to facility.

## 2023-08-06 NOTE — Discharge Instructions (Signed)
 X-ray showed no broken bones to your knees and no evidence of ligament damage.  Please follow-up with orthopedics as needed.  Please keep your legs wrapped as this will help with compression and you can ice the area for 10 to 15 minutes at a time as needed.

## 2023-08-06 NOTE — ED Notes (Addendum)
 Attempted to call facility at this time to give report and to secure pt ride. No answer.

## 2023-08-06 NOTE — ED Triage Notes (Signed)
 First Nurse Note: Pt via [facility] from [facility]. Stated she stood up, legs got weak, sat back onto the couch and then slid out of the couch. Pt c/o back pain. Denies head injury. Pt has a hx of dementia. Pt is A&Ox4 and NAD.  127/90 BP  76 HR  97% on RA

## 2023-08-06 NOTE — ED Provider Notes (Signed)
  Physical Exam  BP (!) 102/53   Pulse 74   Temp 98.3 F (36.8 C) (Oral)   Resp 20   Ht 5' 4 (1.626 m)   Wt 124.7 kg   SpO2 94%   BMI 47.20 kg/m   Physical Exam  Procedures  Procedures  ED Course / MDM   Clinical Course as of 08/06/23 1303  Sat Aug 06, 2023  1251 MR KNEE RIGHT WO CONTRAST No acute injuries [DW]    Clinical Course User Index [DW] Malvina Alm DASEN, MD   Medical Decision Making Amount and/or Complexity of Data Reviewed Radiology: ordered. Decision-making details documented in ED Course.  Risk OTC drugs. Prescription drug management.   Received sign out on patient.  68 year old female presenting today for ground-level fall with injury to bilateral knees.  Patient seen by initial provider with x-rays to bilateral knees and lumbar/thoracic spine.  No obvious fractures seen but there was concern for high riding patella on the right side and follow-up MRI was ordered to rule out patella tendon injury.  MRI negative for any acute injuries.  Patient otherwise stable for discharge back home.  Given strict return precautions and follow-up with PCP.       Malvina Alm DASEN, MD 08/06/23 [phone]

## 2023-08-06 NOTE — ED Notes (Signed)
 Patient taken to imaging.

## 2023-08-06 NOTE — ED Notes (Signed)
 Attempted to call facility two more times and left a voicemail.

## 2023-08-06 NOTE — ED Notes (Signed)
 Patient is resting comfortably on stretcher. Door is open for safety, both rails are up. Waiting for transport.

## 2023-08-06 NOTE — ED Notes (Signed)
 [facility] is here for transport.

## 2023-08-06 NOTE — ED Notes (Signed)
 Contacted patient's family member Jon who gave other numbers to [facility]. No one answered. Will arrange for transport throught [facility].

## 2023-08-06 NOTE — ED Notes (Signed)
 Pt dressed out into gown for MRI.

## 2023-08-06 NOTE — ED Provider Notes (Signed)
 [facility] Provider Note    Event Date/Time   First MD Initiated Contact with Patient 08/06/23 [phone]     (approximate)   History   Back Pain   HPI  Christy [name] is a 68 y.o. female   history of chronic kidney disease schizophrenia and prior history of UTI sepsis  Patient reports she lives at assisted living center.  She got up today to get dressed, and she started to lose her balance.  She had her assistant helping her, but she reports that she is very heavy.  She fell forward onto her knees and then also sort of slid down with her back up against the bed.  She is not strike her head did not injure anything else.  She otherwise feels okay but reports she is very sore in both knees.  She does take tramadol  for pain.  I do not think I broke anything she reports but soreness there and also some over her lower to mid back.  No difficulty breathing no abdominal pain.  Does not report feeling any recent illnesses or fevers.  She is feeling fine but reports that she loses balance fairly easily and today fell forward getting dressed      Physical Exam   Triage Vital Signs: ED Triage Vitals  Encounter Vitals Group     BP 08/06/23 0808 (!) 102/53     Girls Systolic BP Percentile --      Girls Diastolic BP Percentile --      Boys Systolic BP Percentile --      Boys Diastolic BP Percentile --      Pulse Rate 08/06/23 0807 74     Resp 08/06/23 0807 20     Temp 08/06/23 0807 98.3 F (36.8 C)     Temp Source 08/06/23 0807 Oral     SpO2 08/06/23 0807 94 %     Weight 08/06/23 0809 275 lb (124.7 kg)     Height 08/06/23 0809 5' 4 (1.626 m)     Head Circumference --      Peak Flow --      Pain Score 08/06/23 0806 10     Pain Loc --      Pain Education --      Exclude from Growth Chart --     Most recent vital signs: Vitals:   08/06/23 0807 08/06/23 0808  BP:  (!) 102/53  Pulse: 74   Resp: 20   Temp: 98.3 F (36.8 C)   SpO2: 94%       General: Awake, no distress.  She is very pleasant.  She speaks with a slight stuttered speech. CV:  Good peripheral perfusion.  Normal tones and rate Resp:  Normal effort.  Clear with normal work of breathing Abd:  No distention.  Soft nontender nondistended.  Abdominal obesity Other:  To flex and extend both lower extremities but reports soreness over the anterior knees.  She wears knee braces and compression stockings these were removed.  She has slight contusion over the right anterior patella region but no hematoma and demonstrates good range of motion.  No visual abnormality over the left knee but she reports it is sore to touch.  Denies pain through the hips foot ankles.  Able to sit up on her own, and shows me that she has soreness in her lower thoracic to upper lumbar area but seems to be more paraspinous and no obvious central tenderness to palpation over the midline  spine.  Normal sensation of the lower extremities bilateral.  Moves upper extremities well without injury.  Normocephalic atraumatic.  Very pleasant in no distress   ED Results / Procedures / Treatments   Labs (all labs ordered are listed, but only abnormal results are displayed) Labs Reviewed - No data to display   EKG     RADIOLOGY  DG Thoracic Spine 2 View Result Date: 08/06/2023 CLINICAL DATA: Fall.  Pain. EXAM: THORACIC SPINE 2 VIEWS COMPARISON:  09/26/2008 FINDINGS: Assessment of the thoracic spine is limited by demineralization. No discernible fracture evident in the thoracic spine. Frontal film shows no abnormal paraspinal line is suggests hematoma. Diffuse loss of disc height evident. IMPRESSION: Limited assessment of the thoracic spine due to demineralization. No discernible fracture. If there is high clinical concern for fracture, CT or MRI could be used to further evaluate. Electronically Signed   By: Camellia Candle M.D.   On: 08/06/2023 10:47   DG Lumbar Spine 2-3 Views Result Date:  08/06/2023 CLINICAL DATA:  Fall.  Pain. EXAM: LUMBAR SPINE - 2-3 VIEW COMPARISON:  09/26/2008 FINDINGS: Two views study shows no evidence of an acute fracture or subluxation. Loss of disc height noted L4-5 and L5-S1. SI joints unremarkable. Bones are diffusely demineralized. IMPRESSION: Degenerative changes without acute bony findings. Electronically Signed   By: Camellia Candle M.D.   On: 08/06/2023 10:45   DG Knee Complete 4 Views Right Result Date: 08/06/2023 CLINICAL DATA:  Fall.  Pain. EXAM: RIGHT KNEE - COMPLETE 4+ VIEW COMPARISON:  03/27/2018 FINDINGS: No evidence for an acute fracture. Tricompartmental degenerative spurring evident. The patella is high riding on the lateral film. No discrete patellar tendon evident and apparent soft tissue edema is noted in the infrapatellar soft tissues. No worrisome lytic or sclerotic osseous abnormality. No substantial joint effusion. IMPRESSION: 1. High riding patella on the lateral film with no discrete patellar tendon evident and apparent soft tissue edema. Findings suggest patellar tendon rupture. Correlate clinically. MRI of the knee could be used to further evaluate as warranted 2. No acute bony findings. 3. Tricompartmental degenerative spurring. Electronically Signed   By: Camellia Candle M.D.   On: 08/06/2023 10:44   DG Knee Complete 4 Views Left Result Date: 08/06/2023 CLINICAL DATA:  Fall.  Pain. EXAM: LEFT KNEE - COMPLETE 4+ VIEW COMPARISON:  03/27/2018 FINDINGS: No evidence for an acute fracture. No subluxation or dislocation. No joint effusion. Tricompartmental degenerative changes noted, most advanced in the lateral compartment. IMPRESSION: Tricompartmental degenerative changes without acute bony findings. Electronically Signed   By: Camellia Candle M.D.   On: 08/06/2023 10:40   ----------------------------------------- 11:27 AM on 08/06/2023 -----------------------------------------   X-rays interpreted by me grossly negative for fracture.  I agree  with hide right sided patella.  In the clinical context of ecchymosis in this area and pain through range of motion I think it is prudent to exclude patellar tendon rupture.  MRI of the right knee has been ordered.  Patient agreeable advise she does not have any metal implants or metal implanted devices that she is aware of include pacemaker.   PROCEDURES:  Critical Care performed: No  Procedures   MEDICATIONS ORDERED IN ED: Medications  traMADol  (ULTRAM ) tablet 50 mg (50 mg Oral Given 08/06/23 0932)  acetaminophen  (TYLENOL ) tablet 1,000 mg (1,000 mg Oral Given 08/06/23 0931)     IMPRESSION / MDM / ASSESSMENT AND PLAN / ED COURSE  I reviewed the triage vital signs and the nursing notes.  Differential diagnosis includes, but is not limited to, injury suffered from fall.  Patient reports losing her balance with a low risk mechanism falling onto her knees and back not striking her head.  She is awake alert very pleasant.  Will give tramadol  obtain x-rays.  Low pretest probability for fractures.  Patient reports normal state of health she is alert and oriented without acute distress.  Patient's presentation is most consistent with acute complicated illness / injury requiring diagnostic workup.   ----------------------------------------- 11:27 AM on 08/06/2023 ----------------------------------------- Pain well-controlled.  Patient understand agreeable with plan for MRI of the right knee.  Ongoing care assigned to Dr. Malvina with plan to follow-up on MRI right knee and reassessment for disposition thereafter.  Patient does currently live at assisted living and typical mobilities with a walker.       FINAL CLINICAL IMPRESSION(S) / ED DIAGNOSES   Final diagnoses:  Fall, initial encounter  Acute pain of both knees     Rx / DC Orders   ED Discharge Orders     None        Note:  This document was prepared using Dragon voice recognition software and  may include unintentional dictation errors.   Dicky Anes, MD 08/06/23 1128

## 2023-08-20 ENCOUNTER — Emergency Department
Admission: EM | Admit: 2023-08-20 | Discharge: 2023-08-20 | Disposition: A | Discharge status: Skilled Nursing Facility | Attending: Emergency Medicine | Admitting: Emergency Medicine

## 2023-08-20 ENCOUNTER — Other Ambulatory Visit: Payer: Self-pay

## 2023-08-20 ENCOUNTER — Emergency Department

## 2023-08-20 DIAGNOSIS — E1122 Type 2 diabetes mellitus with diabetic chronic kidney disease: Secondary | ICD-10-CM | POA: Diagnosis not present

## 2023-08-20 DIAGNOSIS — I129 Hypertensive chronic kidney disease with stage 1 through stage 4 chronic kidney disease, or unspecified chronic kidney disease: Secondary | ICD-10-CM | POA: Diagnosis not present

## 2023-08-20 DIAGNOSIS — E860 Dehydration: Secondary | ICD-10-CM | POA: Insufficient documentation

## 2023-08-20 DIAGNOSIS — R4182 Altered mental status, unspecified: Secondary | ICD-10-CM | POA: Insufficient documentation

## 2023-08-20 DIAGNOSIS — R5383 Other fatigue: Secondary | ICD-10-CM | POA: Diagnosis present

## 2023-08-20 DIAGNOSIS — N189 Chronic kidney disease, unspecified: Secondary | ICD-10-CM | POA: Insufficient documentation

## 2023-08-20 LAB — COMPREHENSIVE METABOLIC PANEL WITH GFR
ALT: 10 U/L (ref 0–44)
AST: 14 U/L — ABNORMAL LOW (ref 15–41)
Albumin: 3.3 g/dL — ABNORMAL LOW (ref 3.5–5.0)
Alkaline Phosphatase: 86 U/L (ref 38–126)
Anion gap: 12 (ref 5–15)
BUN: 8 mg/dL (ref 8–23)
CO2: 28 mmol/L (ref 22–32)
Calcium: 9.6 mg/dL (ref 8.9–10.3)
Chloride: 99 mmol/L (ref 98–111)
Creatinine, Ser: 0.85 mg/dL (ref 0.44–1.00)
GFR, Estimated: >60 mL/min (ref >60)
Glucose, Bld: 173 mg/dL — ABNORMAL HIGH (ref 70–99)
Potassium: 3.4 mmol/L — ABNORMAL LOW (ref 3.5–5.1)
Sodium: 139 mmol/L (ref 135–145)
Total Bilirubin: 0.7 mg/dL (ref 0.0–1.2)
Total Protein: 6 g/dL — ABNORMAL LOW (ref 6.5–8.1)

## 2023-08-20 LAB — RESP PANEL BY RT-PCR (RSV, FLU A&B, COVID)  RVPGX2
Influenza A by PCR: NEGATIVE
Influenza B by PCR: NEGATIVE
Resp Syncytial Virus by PCR: NEGATIVE
SARS Coronavirus 2 by RT PCR: NEGATIVE

## 2023-08-20 LAB — URINALYSIS, W/ REFLEX TO CULTURE (INFECTION SUSPECTED)
Bacteria, UA: NONE SEEN
Bilirubin Urine: NEGATIVE
Glucose, UA: NEGATIVE mg/dL
Hgb urine dipstick: NEGATIVE
Ketones, ur: NEGATIVE mg/dL
Leukocytes,Ua: NEGATIVE
Nitrite: NEGATIVE
Protein, ur: NEGATIVE mg/dL
Specific Gravity, Urine: 1.01 (ref 1.005–1.030)
pH: 5 (ref 5.0–8.0)

## 2023-08-20 LAB — CBC WITH DIFFERENTIAL/PLATELET
Abs Immature Granulocytes: 0.05 K/uL (ref 0.00–0.07)
Basophils Absolute: 0 K/uL (ref 0.0–0.1)
Basophils Relative: 0 %
Eosinophils Absolute: 2.7 K/uL — ABNORMAL HIGH (ref 0.0–0.5)
Eosinophils Relative: 28 %
HCT: 38.5 % (ref 36.0–46.0)
Hemoglobin: 11.6 g/dL — ABNORMAL LOW (ref 12.0–15.0)
Immature Granulocytes: 1 %
Lymphocytes Relative: 14 %
Lymphs Abs: 1.4 K/uL (ref 0.7–4.0)
MCH: 21.3 pg — ABNORMAL LOW (ref 26.0–34.0)
MCHC: 30.1 g/dL (ref 30.0–36.0)
MCV: 70.8 fL — ABNORMAL LOW (ref 80.0–100.0)
Monocytes Absolute: 1 K/uL (ref 0.1–1.0)
Monocytes Relative: 10 %
Neutro Abs: 4.6 K/uL (ref 1.7–7.7)
Neutrophils Relative %: 47 %
Platelets: 199 K/uL (ref 150–400)
RBC: 5.44 MIL/uL — ABNORMAL HIGH (ref 3.87–5.11)
RDW: 17 % — ABNORMAL HIGH (ref 11.5–15.5)
Smear Review: NORMAL
WBC: 9.8 K/uL (ref 4.0–10.5)
nRBC: 0 % (ref 0.0–0.2)

## 2023-08-20 LAB — LACTIC ACID, PLASMA: Lactic Acid, Venous: 1.6 mmol/L (ref 0.5–1.9)

## 2023-08-20 MED ORDER — SODIUM CHLORIDE 0.9 % IV SOLN: Freq: Once | INTRAVENOUS | Status: AC

## 2023-08-20 NOTE — ED Notes (Signed)
 Pt's HPOA, Jon, provided an update.

## 2023-08-20 NOTE — ED Notes (Signed)
 Pt's POA, Jon, provided an update.

## 2023-08-20 NOTE — ED Notes (Signed)
 2 different facility numbers attempted w/o answer.

## 2023-08-20 NOTE — ED Triage Notes (Signed)
 Per EMS, Pt, from [facility], presents with increased AMS and fatigue.  Pt c/o bilateral knee pain and fatigue.  Pt was seen recently for similar and diagnosed w/ a UTI.    Pt is A&Ox2 at baseline.

## 2023-08-20 NOTE — Discharge Instructions (Addendum)
 Patient had a very reassuring workup in the emergency department, she did receive IV fluids.  She appears to be at her baseline suspect mild dehydration as the cause

## 2023-08-20 NOTE — ED Notes (Signed)
 Pt wanted to leave in a gown.  All belongings, paperwork, and report given to transport.

## 2023-08-20 NOTE — ED Provider Notes (Addendum)
 [facility] Provider Note    Event Date/Time   First MD Initiated Contact with Patient 08/20/23 [phone]     (approximate)   History   Altered Mental Status and Fatigue  HPI  Christy [name] is a 68 y.o. female with history of diabetes, hypertension schizophrenia, CKD, sepsis who presents with fatigue, difficulty to arouse this morning.  Overall she reports she feels tired but has no specific complaints.  Review of medical records demonstrates admission for E. coli bacteremia on November 24 with similar presentation.     Physical Exam   Triage Vital Signs: ED Triage Vitals [08/20/23 0753]  Encounter Vitals Group     BP      Girls Systolic BP Percentile      Girls Diastolic BP Percentile      Boys Systolic BP Percentile      Boys Diastolic BP Percentile      Pulse      Resp      Temp      Temp src      SpO2 90 %     Weight      Height      Head Circumference      Peak Flow      Pain Score      Pain Loc      Pain Education      Exclude from Growth Chart     Most recent vital signs: Vitals:   08/20/23 1005 08/20/23 1006  BP: (!) 124/56   Pulse: 60 64  Resp: 17 17  Temp:    SpO2: 98% 98%     General: Awake, no distress.  Answering questions appropriately CV:  Good peripheral perfusion.  Resp:  Normal effort.  Clear to auscultation bilaterally Abd:  No distention.  Soft, nontender, no CVA tenderness Other:  No rash noted Cranial nerves II through XII are normal, normal strength in all extremities   ED Results / Procedures / Treatments   Labs (all labs ordered are listed, but only abnormal results are displayed) Labs Reviewed  COMPREHENSIVE METABOLIC PANEL WITH GFR - Abnormal; Notable for the following components:      Result Value   Potassium 3.4 (*)    Glucose, Bld 173 (*)    Total Protein 6.0 (*)    Albumin 3.3 (*)    AST 14 (*)    All other components within normal limits  CBC WITH DIFFERENTIAL/PLATELET - Abnormal; Notable  for the following components:   RBC 5.44 (*)    Hemoglobin 11.6 (*)    MCV 70.8 (*)    MCH 21.3 (*)    RDW 17.0 (*)    Eosinophils Absolute 2.7 (*)    All other components within normal limits  URINALYSIS, W/ REFLEX TO CULTURE (INFECTION SUSPECTED) - Abnormal; Notable for the following components:   Color, Urine YELLOW (*)    APPearance CLEAR (*)    All other components within normal limits  RESP PANEL BY RT-PCR (RSV, FLU A&B, COVID)  [id number]  CULTURE, BLOOD (ROUTINE X 2)  CULTURE, BLOOD (ROUTINE X 2)  LACTIC ACID, PLASMA  PATHOLOGIST SMEAR REVIEW     EKG  ED ECG REPORT I, Lamar Price, the attending physician, personally viewed and interpreted this ECG.  Date: 08/20/2023  Rhythm: normal sinus rhythm QRS Axis: normal Intervals: Abnormal ST/T Wave abnormalities: Nonspecific changes Narrative Interpretation: no evidence of acute ischemia    RADIOLOGY Chest x-ray viewed interpreted by me, without acute abnormality,  confirmed by radiology    PROCEDURES:  Critical Care performed:   Procedures   MEDICATIONS ORDERED IN ED: Medications  0.9 %  sodium chloride  infusion (0 mLs Intravenous Stopped 08/20/23 1002)     IMPRESSION / MDM / ASSESSMENT AND PLAN / ED COURSE  I reviewed the triage vital signs and the nursing notes. Patient's presentation is most consistent with acute presentation with potential threat to life or bodily function.  Patient presents with symptoms as noted above, difficult to arouse this morning, mental status appears to be appropriate at this time.  No neurodeficits to suggest CVA.  Suspicious for metabolic derangement, will obtain labs, chest x-ray, urinalysis to evaluate for infection. ----------------------------------------- 9:21 AM on 08/20/2023 ----------------------------------------- Thus far workup is quite reassuring, urinalysis is unremarkable, lactic acid is normal, CBC CMP reassuring.  Patient is receiving IV fluids.  COVID test is  negative  ----------------------------------------- 10:14 AM on 08/20/2023 ----------------------------------------- Patient remains quite well-appearing, blood pressure has normalized, she is certainly at her baseline with no altered mental status or confusion.  No neurodeficits, no indication for admission at this time, appropriate for discharge with strict return precautions      FINAL CLINICAL IMPRESSION(S) / ED DIAGNOSES   Final diagnoses:  Dehydration     Rx / DC Orders   ED Discharge Orders     None        Note:  This document was prepared using Dragon voice recognition software and may include unintentional dictation errors.   Arlander Charleston, MD 08/20/23 1015    Arlander Charleston, MD 08/20/23 [phone]

## 2023-08-22 LAB — PATHOLOGIST SMEAR REVIEW

## 2023-08-25 LAB — CULTURE, BLOOD (ROUTINE X 2)
Culture: NO GROWTH
Culture: NO GROWTH

## 2023-12-08 ENCOUNTER — Emergency Department

## 2023-12-08 ENCOUNTER — Other Ambulatory Visit: Payer: Self-pay

## 2023-12-08 ENCOUNTER — Inpatient Hospital Stay
Admission: EM | Admit: 2023-12-08 | Discharge: 2023-12-11 | DRG: 872 | Disposition: A | Discharge status: Home Health | Attending: Emergency Medicine | Admitting: Emergency Medicine

## 2023-12-08 DIAGNOSIS — A419 Sepsis, unspecified organism: Secondary | ICD-10-CM | POA: Diagnosis not present

## 2023-12-08 DIAGNOSIS — R6510 Systemic inflammatory response syndrome (SIRS) of non-infectious origin without acute organ dysfunction: Secondary | ICD-10-CM | POA: Diagnosis present

## 2023-12-08 DIAGNOSIS — K219 Gastro-esophageal reflux disease without esophagitis: Secondary | ICD-10-CM | POA: Diagnosis present

## 2023-12-08 DIAGNOSIS — Z90710 Acquired absence of both cervix and uterus: Secondary | ICD-10-CM | POA: Diagnosis not present

## 2023-12-08 DIAGNOSIS — M109 Gout, unspecified: Secondary | ICD-10-CM | POA: Diagnosis present

## 2023-12-08 DIAGNOSIS — Z7982 Long term (current) use of aspirin: Secondary | ICD-10-CM | POA: Diagnosis not present

## 2023-12-08 DIAGNOSIS — E875 Hyperkalemia: Secondary | ICD-10-CM | POA: Diagnosis present

## 2023-12-08 DIAGNOSIS — Z91011 Allergy to milk products, unspecified: Secondary | ICD-10-CM

## 2023-12-08 DIAGNOSIS — Z7984 Long term (current) use of oral hypoglycemic drugs: Secondary | ICD-10-CM

## 2023-12-08 DIAGNOSIS — Z881 Allergy status to other antibiotic agents status: Secondary | ICD-10-CM | POA: Diagnosis not present

## 2023-12-08 DIAGNOSIS — E785 Hyperlipidemia, unspecified: Secondary | ICD-10-CM | POA: Diagnosis present

## 2023-12-08 DIAGNOSIS — W19XXXA Unspecified fall, initial encounter: Secondary | ICD-10-CM

## 2023-12-08 DIAGNOSIS — Z8619 Personal history of other infectious and parasitic diseases: Secondary | ICD-10-CM | POA: Diagnosis not present

## 2023-12-08 DIAGNOSIS — W06XXXA Fall from bed, initial encounter: Secondary | ICD-10-CM | POA: Diagnosis present

## 2023-12-08 DIAGNOSIS — G20A1 Parkinson's disease without dyskinesia, without mention of fluctuations: Secondary | ICD-10-CM | POA: Diagnosis present

## 2023-12-08 DIAGNOSIS — R6520 Severe sepsis without septic shock: Secondary | ICD-10-CM | POA: Diagnosis present

## 2023-12-08 DIAGNOSIS — J45909 Unspecified asthma, uncomplicated: Secondary | ICD-10-CM | POA: Diagnosis present

## 2023-12-08 DIAGNOSIS — K5900 Constipation, unspecified: Secondary | ICD-10-CM | POA: Diagnosis present

## 2023-12-08 DIAGNOSIS — Z833 Family history of diabetes mellitus: Secondary | ICD-10-CM | POA: Diagnosis not present

## 2023-12-08 DIAGNOSIS — M1990 Unspecified osteoarthritis, unspecified site: Secondary | ICD-10-CM | POA: Diagnosis present

## 2023-12-08 DIAGNOSIS — E119 Type 2 diabetes mellitus without complications: Secondary | ICD-10-CM | POA: Diagnosis present

## 2023-12-08 DIAGNOSIS — E66812 Obesity, class 2: Secondary | ICD-10-CM | POA: Diagnosis present

## 2023-12-08 DIAGNOSIS — F17210 Nicotine dependence, cigarettes, uncomplicated: Secondary | ICD-10-CM | POA: Diagnosis present

## 2023-12-08 DIAGNOSIS — Z6839 Body mass index (BMI) 39.0-39.9, adult: Secondary | ICD-10-CM

## 2023-12-08 DIAGNOSIS — Z9049 Acquired absence of other specified parts of digestive tract: Secondary | ICD-10-CM

## 2023-12-08 DIAGNOSIS — I10 Essential (primary) hypertension: Secondary | ICD-10-CM | POA: Diagnosis present

## 2023-12-08 DIAGNOSIS — Z888 Allergy status to other drugs, medicaments and biological substances status: Secondary | ICD-10-CM

## 2023-12-08 DIAGNOSIS — Z91013 Allergy to seafood: Secondary | ICD-10-CM

## 2023-12-08 DIAGNOSIS — F209 Schizophrenia, unspecified: Secondary | ICD-10-CM | POA: Diagnosis present

## 2023-12-08 DIAGNOSIS — Z79899 Other long term (current) drug therapy: Secondary | ICD-10-CM

## 2023-12-08 HISTORY — DX: Dysphagia, unspecified: R13.10

## 2023-12-08 HISTORY — DX: Sleep apnea, unspecified: G47.30

## 2023-12-08 HISTORY — DX: Drug induced subacute dyskinesia: G24.01

## 2023-12-08 HISTORY — DX: Dementia in other diseases classified elsewhere, unspecified severity, without behavioral disturbance, psychotic disturbance, mood disturbance, and anxiety: F02.80

## 2023-12-08 HISTORY — DX: Overactive bladder: N32.81

## 2023-12-08 HISTORY — DX: Parkinson's disease without dyskinesia, without mention of fluctuations: G20.A1

## 2023-12-08 LAB — RESPIRATORY PANEL BY PCR

## 2023-12-08 LAB — GLUCOSE, CAPILLARY
Glucose-Capillary: 197 mg/dL — ABNORMAL HIGH (ref 70–99)
Glucose-Capillary: 147 mg/dL — ABNORMAL HIGH (ref 70–99)
Glucose-Capillary: 161 mg/dL — ABNORMAL HIGH (ref 70–99)

## 2023-12-08 LAB — PROTIME-INR
INR: 1 (ref 0.8–1.2)
Prothrombin Time: 14 s (ref 11.4–15.2)

## 2023-12-08 LAB — COMPREHENSIVE METABOLIC PANEL WITH GFR
ALT: 10 U/L (ref 0–44)
AST: 18 U/L (ref 15–41)
Albumin: 4.1 g/dL (ref 3.5–5.0)
Alkaline Phosphatase: 99 U/L (ref 38–126)
Anion gap: 10 (ref 5–15)
BUN: 10 mg/dL (ref 8–23)
CO2: 29 mmol/L (ref 22–32)
Calcium: 9.5 mg/dL (ref 8.9–10.3)
Chloride: 100 mmol/L (ref 98–111)
Creatinine, Ser: 0.98 mg/dL (ref 0.44–1.00)
GFR, Estimated: >60 mL/min (ref >60)
Glucose, Bld: 143 mg/dL — ABNORMAL HIGH (ref 70–99)
Potassium: 4.1 mmol/L (ref 3.5–5.1)
Sodium: 139 mmol/L (ref 135–145)
Total Bilirubin: 0.5 mg/dL (ref 0.0–1.2)
Total Protein: 6.5 g/dL (ref 6.5–8.1)

## 2023-12-08 LAB — HEMOGLOBIN A1C
Hgb A1c MFr Bld: 7.6 % — ABNORMAL HIGH (ref 4.8–5.6)
Mean Plasma Glucose: 171.42 mg/dL

## 2023-12-08 LAB — URINALYSIS, W/ REFLEX TO CULTURE (INFECTION SUSPECTED)
Bacteria, UA: NONE SEEN
Bilirubin Urine: NEGATIVE
Glucose, UA: NEGATIVE mg/dL
Hgb urine dipstick: NEGATIVE
Ketones, ur: NEGATIVE mg/dL
Leukocytes,Ua: NEGATIVE
Nitrite: NEGATIVE
Protein, ur: NEGATIVE mg/dL
Specific Gravity, Urine: 1.033 — ABNORMAL HIGH (ref 1.005–1.030)
pH: 7 (ref 5.0–8.0)

## 2023-12-08 LAB — RESP PANEL BY RT-PCR (RSV, FLU A&B, COVID)  RVPGX2
Influenza A by PCR: NEGATIVE
Influenza B by PCR: NEGATIVE
Resp Syncytial Virus by PCR: NEGATIVE
SARS Coronavirus 2 by RT PCR: NEGATIVE

## 2023-12-08 LAB — MRSA NEXT GEN BY PCR, NASAL: MRSA by PCR Next Gen: DETECTED — AB

## 2023-12-08 LAB — CBC WITH DIFFERENTIAL/PLATELET
Abs Immature Granulocytes: 0.05 K/uL (ref 0.00–0.07)
Basophils Absolute: 0.1 K/uL (ref 0.0–0.1)
Basophils Relative: 1 %
Eosinophils Absolute: 0.1 K/uL (ref 0.0–0.5)
Eosinophils Relative: 1 %
HCT: 39.2 % (ref 36.0–46.0)
Hemoglobin: 12.2 g/dL (ref 12.0–15.0)
Immature Granulocytes: 1 %
Lymphocytes Relative: 5 %
Lymphs Abs: 0.5 K/uL — ABNORMAL LOW (ref 0.7–4.0)
MCH: 21.9 pg — ABNORMAL LOW (ref 26.0–34.0)
MCHC: 31.1 g/dL (ref 30.0–36.0)
MCV: 70.5 fL — ABNORMAL LOW (ref 80.0–100.0)
Monocytes Absolute: 1.1 K/uL — ABNORMAL HIGH (ref 0.1–1.0)
Monocytes Relative: 10 %
Neutro Abs: 9.1 K/uL — ABNORMAL HIGH (ref 1.7–7.7)
Neutrophils Relative %: 82 %
Platelets: 191 K/uL (ref 150–400)
RBC: 5.56 MIL/uL — ABNORMAL HIGH (ref 3.87–5.11)
RDW: 16.6 % — ABNORMAL HIGH (ref 11.5–15.5)
WBC: 10.9 K/uL — ABNORMAL HIGH (ref 4.0–10.5)
nRBC: 0 % (ref 0.0–0.2)

## 2023-12-08 LAB — LACTIC ACID, PLASMA
Lactic Acid, Venous: 2.1 mmol/L (ref 0.5–1.9)
Lactic Acid, Venous: 1.1 mmol/L (ref 0.5–1.9)

## 2023-12-08 MED ORDER — INSULIN ASPART 100 UNIT/ML IJ SOLN
0.0000 [IU] | Freq: Three times a day (TID) | INTRAMUSCULAR | Status: DC
  Administered 2023-12-08: 2 [IU] via SUBCUTANEOUS
  Administered 2023-12-08 – 2023-12-09 (×2): 3 [IU] via SUBCUTANEOUS
  Administered 2023-12-09: 1 [IU] via SUBCUTANEOUS
  Administered 2023-12-09: 3 [IU] via SUBCUTANEOUS
  Administered 2023-12-10: 2 [IU] via SUBCUTANEOUS
  Administered 2023-12-10: 3 [IU] via SUBCUTANEOUS
  Administered 2023-12-10 – 2023-12-11 (×2): 2 [IU] via SUBCUTANEOUS
  Filled 2023-12-08: qty 1
  Filled 2023-12-08: qty 2
  Filled 2023-12-08: qty 3
  Filled 2023-12-08: qty 2
  Filled 2023-12-08: qty 3
  Filled 2023-12-08: qty 2
  Filled 2023-12-08 (×2): qty 3

## 2023-12-08 MED ORDER — ONDANSETRON HCL 4 MG PO TABS: 4.0000 mg | ORAL_TABLET | Freq: Four times a day (QID) | ORAL | Status: DC | PRN

## 2023-12-08 MED ORDER — SIMVASTATIN 20 MG PO TABS
10.0000 mg | ORAL_TABLET | Freq: Every day | ORAL | Status: DC
  Administered 2023-12-08 – 2023-12-11 (×4): 10 mg via ORAL
  Filled 2023-12-08 (×5): qty 1

## 2023-12-08 MED ORDER — FLUPHENAZINE HCL 1 MG PO TABS
1.0000 mg | ORAL_TABLET | Freq: Three times a day (TID) | ORAL | Status: DC
  Administered 2023-12-08 – 2023-12-11 (×9): 1 mg via ORAL
  Filled 2023-12-08 (×11): qty 1

## 2023-12-08 MED ORDER — PANTOPRAZOLE SODIUM 40 MG PO TBEC
40.0000 mg | DELAYED_RELEASE_TABLET | Freq: Every day | ORAL | Status: DC
  Administered 2023-12-08 – 2023-12-11 (×4): 40 mg via ORAL
  Filled 2023-12-08 (×5): qty 1

## 2023-12-08 MED ORDER — ENOXAPARIN SODIUM 40 MG/0.4ML IJ SOSY: 40.0000 mg | PREFILLED_SYRINGE | INTRAMUSCULAR | Status: DC

## 2023-12-08 MED ORDER — OXYCODONE HCL 5 MG PO TABS
5.0000 mg | ORAL_TABLET | ORAL | Status: DC | PRN
  Administered 2023-12-08 – 2023-12-10 (×4): 5 mg via ORAL
  Filled 2023-12-08 (×4): qty 1

## 2023-12-08 MED ORDER — ENOXAPARIN SODIUM 60 MG/0.6ML IJ SOSY
50.0000 mg | PREFILLED_SYRINGE | INTRAMUSCULAR | Status: DC
  Administered 2023-12-08 – 2023-12-10 (×3): 50 mg via SUBCUTANEOUS
  Filled 2023-12-08 (×4): qty 0.6

## 2023-12-08 MED ORDER — TORSEMIDE 20 MG PO TABS
30.0000 mg | ORAL_TABLET | Freq: Two times a day (BID) | ORAL | Status: DC
  Administered 2023-12-08: 30 mg via ORAL
  Filled 2023-12-08 (×2): qty 2

## 2023-12-08 MED ORDER — ALBUTEROL SULFATE (2.5 MG/3ML) 0.083% IN NEBU: 2.5000 mg | INHALATION_SOLUTION | RESPIRATORY_TRACT | Status: DC | PRN

## 2023-12-08 MED ORDER — LACTATED RINGERS IV BOLUS
500.0000 mL | Freq: Once | INTRAVENOUS | Status: AC
  Administered 2023-12-08: 500 mL via INTRAVENOUS

## 2023-12-08 MED ORDER — SODIUM CHLORIDE 0.9 % IV SOLN
1.0000 g | Freq: Once | INTRAVENOUS | Status: AC
  Administered 2023-12-08: 1 g via INTRAVENOUS
  Filled 2023-12-08: qty 20

## 2023-12-08 MED ORDER — FERROUS SULFATE 325 (65 FE) MG PO TABS
325.0000 mg | ORAL_TABLET | Freq: Every day | ORAL | Status: DC
  Administered 2023-12-09 – 2023-12-11 (×3): 325 mg via ORAL
  Filled 2023-12-08 (×3): qty 1

## 2023-12-08 MED ORDER — INSULIN GLARGINE-YFGN 100 UNIT/ML ~~LOC~~ SOLN
18.0000 [IU] | Freq: Every day | SUBCUTANEOUS | Status: DC
  Administered 2023-12-08 – 2023-12-10 (×3): 18 [IU] via SUBCUTANEOUS
  Filled 2023-12-08 (×4): qty 0.18

## 2023-12-08 MED ORDER — GABAPENTIN 100 MG PO CAPS
100.0000 mg | ORAL_CAPSULE | Freq: Every day | ORAL | Status: DC
  Administered 2023-12-08 – 2023-12-10 (×3): 100 mg via ORAL
  Filled 2023-12-08 (×3): qty 1

## 2023-12-08 MED ORDER — ASPIRIN 81 MG PO TBEC
81.0000 mg | DELAYED_RELEASE_TABLET | Freq: Every day | ORAL | Status: DC
  Administered 2023-12-08 – 2023-12-11 (×4): 81 mg via ORAL
  Filled 2023-12-08 (×5): qty 1

## 2023-12-08 MED ORDER — PRIMIDONE 50 MG PO TABS
12.5000 mg | ORAL_TABLET | Freq: Every day | ORAL | Status: DC
  Administered 2023-12-08 – 2023-12-10 (×3): 12.5 mg via ORAL
  Filled 2023-12-08 (×3): qty 0.25

## 2023-12-08 MED ORDER — SODIUM CHLORIDE 0.9 % IV SOLN
1.0000 g | Freq: Three times a day (TID) | INTRAVENOUS | Status: DC
  Administered 2023-12-08 – 2023-12-10 (×5): 1 g via INTRAVENOUS
  Filled 2023-12-08 (×6): qty 20

## 2023-12-08 MED ORDER — OXYCODONE HCL 5 MG PO TABS
5.0000 mg | ORAL_TABLET | ORAL | Status: AC
  Administered 2023-12-08: 5 mg via ORAL
  Filled 2023-12-08: qty 1

## 2023-12-08 MED ORDER — INSULIN ASPART 100 UNIT/ML IJ SOLN: 0.0000 [IU] | Freq: Every day | INTRAMUSCULAR | Status: DC

## 2023-12-08 MED ORDER — ACETAMINOPHEN 650 MG RE SUPP: 650.0000 mg | Freq: Four times a day (QID) | RECTAL | Status: DC | PRN

## 2023-12-08 MED ORDER — FOLIC ACID 1 MG PO TABS
1.0000 mg | ORAL_TABLET | Freq: Every day | ORAL | Status: DC
  Administered 2023-12-08 – 2023-12-11 (×4): 1 mg via ORAL
  Filled 2023-12-08 (×5): qty 1

## 2023-12-08 MED ORDER — TRAZODONE HCL 50 MG PO TABS
25.0000 mg | ORAL_TABLET | Freq: Every evening | ORAL | Status: DC | PRN
Start: 2023-12-08 — End: 2023-12-11
  Administered 2023-12-08 – 2023-12-09 (×2): 25 mg via ORAL
  Filled 2023-12-08 (×2): qty 1

## 2023-12-08 MED ORDER — METOPROLOL TARTRATE 25 MG PO TABS
12.5000 mg | ORAL_TABLET | Freq: Two times a day (BID) | ORAL | Status: DC
  Administered 2023-12-08: 12.5 mg via ORAL
  Filled 2023-12-08 (×3): qty 1

## 2023-12-08 MED ORDER — ACETAMINOPHEN 325 MG PO TABS
650.0000 mg | ORAL_TABLET | Freq: Four times a day (QID) | ORAL | Status: DC | PRN
  Administered 2023-12-08 – 2023-12-09 (×2): 650 mg via ORAL
  Filled 2023-12-08 (×2): qty 2

## 2023-12-08 MED ORDER — ONDANSETRON HCL 4 MG/2ML IJ SOLN
4.0000 mg | Freq: Four times a day (QID) | INTRAMUSCULAR | Status: DC | PRN
  Administered 2023-12-10: 4 mg via INTRAVENOUS
  Filled 2023-12-08: qty 2

## 2023-12-08 MED ORDER — ACETAMINOPHEN 500 MG PO TABS
1000.0000 mg | ORAL_TABLET | ORAL | Status: AC
  Administered 2023-12-08: 1000 mg via ORAL
  Filled 2023-12-08: qty 2

## 2023-12-08 MED ORDER — LOSARTAN POTASSIUM 50 MG PO TABS
50.0000 mg | ORAL_TABLET | Freq: Every day | ORAL | Status: DC
  Administered 2023-12-08: 50 mg via ORAL
  Filled 2023-12-08 (×3): qty 1

## 2023-12-08 MED ORDER — IOHEXOL 350 MG/ML SOLN
80.0000 mL | Freq: Once | INTRAVENOUS | Status: AC | PRN
  Administered 2023-12-08: 80 mL via INTRAVENOUS

## 2023-12-08 MED ORDER — ALLOPURINOL 100 MG PO TABS
100.0000 mg | ORAL_TABLET | Freq: Every day | ORAL | Status: DC
  Administered 2023-12-08 – 2023-12-11 (×4): 100 mg via ORAL
  Filled 2023-12-08 (×4): qty 1

## 2023-12-08 NOTE — ED Triage Notes (Signed)
 Pt bib EMS from [facility] after experiencing a fall this morning. Pt reports that she got bounced out of bed by the mattress. Reports hitting right side and back on dresser with 10/10 pain. Per EMS pt was tachy and febrile with a temp of 102 axillary.

## 2023-12-08 NOTE — Plan of Care (Signed)
  Problem: Education: Goal: Ability to describe self-care measures that may prevent or decrease complications (Diabetes Survival Skills Education) will improve Outcome: Progressing   Problem: Coping: Goal: Ability to adjust to condition or change in health will improve Outcome: Progressing   Problem: Fluid Volume: Goal: Ability to maintain a balanced intake and output will improve Outcome: Progressing   Problem: Health Behavior/Discharge Planning: Goal: Ability to identify and utilize available resources and services will improve Outcome: Progressing   Problem: Metabolic: Goal: Ability to maintain appropriate glucose levels will improve Outcome: Progressing   Problem: Nutritional: Goal: Maintenance of adequate nutrition will improve Outcome: Progressing   Problem: Skin Integrity: Goal: Risk for impaired skin integrity will decrease Outcome: Progressing   Problem: Tissue Perfusion: Goal: Adequacy of tissue perfusion will improve Outcome: Progressing   Problem: Education: Goal: Knowledge of General Education information will improve Description: Including pain rating scale, medication(s)/side effects and non-pharmacologic comfort measures Outcome: Progressing   Problem: Health Behavior/Discharge Planning: Goal: Ability to manage health-related needs will improve Outcome: Progressing   Problem: Activity: Goal: Risk for activity intolerance will decrease Outcome: Progressing   Problem: Nutrition: Goal: Adequate nutrition will be maintained Outcome: Progressing   Problem: Coping: Goal: Level of anxiety will decrease Outcome: Progressing   Problem: Elimination: Goal: Will not experience complications related to bowel motility Outcome: Progressing   Problem: Safety: Goal: Ability to remain free from injury will improve Outcome: Progressing   Problem: Skin Integrity: Goal: Risk for impaired skin integrity will decrease Outcome: Progressing

## 2023-12-08 NOTE — ED Notes (Signed)
 Placed patient on 2L [place] due to sats 90%

## 2023-12-08 NOTE — H&P (Addendum)
 History and Physical  Christy [name] FMW:[id number] DOB: 07/15/1955 DOA: 12/08/2023  PCP: Bertrum Charlie CROME, MD   Chief Complaint: Fall out of bed  HPI: Christy [name] is a 68 y.o. female with medical history significant for non-insulin -dependent type 2 diabetes, GERD, hypertension, hyperlipidemia, schizophrenia and Parkinson's disease being admitted to the hospital with severe sepsis.  Patient lives in assisted living facility, this morning states that she fell out of bed, the mattress bounced her out of the bed.  She denies any recent illness, though she had a slight cough for the last 24 hours which was nonproductive.  She denies any known fevers, chills, nausea, vomiting, sick contacts.  Specifically denies any dysuria, abdominal pain, diarrhea or any other changes in bowel or bladder habits.  On arrival to emergency department, the patient was noted to be tachycardic, febrile and lab workup shows minimally elevated leukocytosis and lactic acid.  She was referred for hospitalist admission.  Review of Systems: Please see HPI for pertinent positives and negatives. A complete 10 system review of systems are otherwise negative.  Past Medical History:  Diagnosis Date   Arthritis    Asthma    Diabetes mellitus without complication (HCC)    Patient takes Metformin    Edema    Genital herpes    GERD (gastroesophageal reflux disease)    Hyperlipidemia    Hypertension    Malaise and fatigue    Mental status alteration    Parkinson disease (HCC)    Schizophrenia (HCC)    Past Surgical History:  Procedure Laterality Date   ABDOMINAL HYSTERECTOMY     due to fibroids in 1997   CHOLECYSTECTOMY     COLONOSCOPY WITH PROPOFOL  N/A 08/15/2015   Procedure: COLONOSCOPY WITH PROPOFOL ;  Surgeon: Gladis RAYMOND Mariner, MD;  Location: [facility] ENDOSCOPY;  Service: Endoscopy;  Laterality: N/A;   ESOPHAGOGASTRODUODENOSCOPY (EGD) WITH PROPOFOL  N/A 08/15/2015   Procedure: ESOPHAGOGASTRODUODENOSCOPY (EGD) WITH  PROPOFOL ;  Surgeon: Gladis RAYMOND Mariner, MD;  Location: [facility] ENDOSCOPY;  Service: Endoscopy;  Laterality: N/A;   FOOT SURGERY Right    GANGLION CYST EXCISION     TONSILLECTOMY     UPPER GI ENDOSCOPY  07/31/13   mild chronic inflammation and reactive gastropathy-no need for another EGD repeat   Social History:  reports that she has been smoking cigarettes. She has never used smokeless tobacco. She reports current alcohol use. She reports that she does not use drugs.  Allergies  Allergen Reactions   Benzoyl Peroxide    Cephalexin Itching   Other Diarrhea    Milk containing products (dairy) causes diarrhea   Peroxide [Hydrogen Peroxide] Swelling   Shellfish Allergy Nausea And Vomiting    Family History  Problem Relation Age of Onset   Diabetes Mother    Pulmonary embolism Mother    Diabetes Father      Prior to Admission medications   Medication Sig Start Date End Date Taking? Authorizing Provider  ACCU-CHEK SOFTCLIX LANCETS lancets Check sugar once daily  DX E11.9 01/10/17   Bertrum Charlie CROME, MD  acetaminophen  (TYLENOL ) 500 MG tablet Take 2 tablets by mouth in the morning, at noon, and at bedtime.    [provider]  aspirin  81 MG EC tablet Take 1 tablet by mouth daily.    [provider]  dicyclomine  (BENTYL ) 10 MG capsule Take 1 capsule (10 mg total) by mouth 2 (two) times daily. 09/09/17 09/10/20  Bertrum Charlie CROME, MD  diphenoxylate -atropine  (LOMOTIL ) 2.5-0.025 MG tablet Take 1 tablet by  mouth 2 (two) times daily as needed for diarrhea or loose stools.    [provider]  FLUPHENAZINE DECANOATE IJ Inject 25 mg into the muscle every 21 ( twenty-one) days.    [provider]  furosemide  (LASIX ) 40 MG tablet Take 1 tablet (40 mg total) by mouth 2 (two) times daily. 04/28/18   Bertrum Charlie CROME, MD  glimepiride  (AMARYL ) 4 MG tablet Two tablets daily with breakfast 11/23/17   Bertrum Charlie CROME, MD  glucose blood (ACCU-CHEK AVIVA) test strip Check  sugar once daily DX E11.9 12/21/16   Bertrum Charlie CROME, MD  Lancet Devices [facility]) lancets Check sugar once daily, DX E11.9 08/13/15   Bertrum Charlie CROME, MD  loratadine  (CLARITIN ) 10 MG tablet Take 1 tablet (10 mg total) by mouth daily. 08/17/18   Bertrum Charlie CROME, MD  losartan  (COZAAR ) 50 MG tablet Take 1 tablet (50 mg total) by mouth daily. 07/25/18   Bertrum Charlie CROME, MD  Menthol, Topical Analgesic, (ICY HOT ORIGINAL PAIN RELIEF) 2.5 % GEL Apply 1 application topically in the morning, at noon, and at bedtime. To both knees    [provider]  metFORMIN  (GLUCOPHAGE ) 1000 MG tablet Take 1 tablet (1,000 mg total) by mouth 2 (two) times daily with a meal. 07/18/18   Bertrum Charlie CROME, MD  metoprolol  succinate (TOPROL -XL) 25 MG 24 hr tablet Take 0.5 tablets (12.5 mg total) by mouth daily. 11/10/17   Bertrum Charlie CROME, MD  Multiple Vitamins-Minerals (MULTI-VITAMIN GUMMIES) CHEW Chew 2 tablets by mouth daily at 12 noon.    [provider]  olopatadine  (PATANOL) 0.1 % ophthalmic solution Place 1 drop into both eyes daily at 12 noon.    [provider]  ondansetron  (ZOFRAN -ODT) 4 MG disintegrating tablet Take 1 tablet (4 mg total) by mouth every 8 (eight) hours as needed for nausea or vomiting. 05/29/22   Menshew, Candida LULLA Kings, PA-C  oxybutynin  (DITROPAN ) 5 MG tablet Take 1 tablet by mouth in the morning, at noon, and at bedtime.    [provider]  pantoprazole  (PROTONIX ) 40 MG tablet Take 40 mg by mouth daily.    [provider]  potassium chloride  (KLOR-CON ) 10 MEQ tablet Take 1 tablet by mouth daily.    [provider]  simvastatin  (ZOCOR ) 10 MG tablet Take 1 tablet (10 mg total) by mouth daily. 01/20/18   Bertrum Charlie CROME, MD  traMADol  (ULTRAM ) 50 MG tablet Take 1 tablet by mouth in the morning, at noon, and at bedtime.  Give 1 tablet q 12h prn breakthrough pain. Hold for lethargy    [provider]  trihexyphenidyl   (ARTANE ) 2 MG tablet Take 1 tablet by mouth daily.    [provider]  vitamin B-12 (CYANOCOBALAMIN ) 1000 MCG tablet Take 1,000 mcg by mouth daily.    [provider]    Physical Exam: BP 131/64 Comment: 2L [place]  Pulse 93 Comment: 2L [place]  Temp (!) 101 F (38.3 C) (Oral)   Resp (!) 21 Comment: 2L [place]  Ht 5' 4 (1.626 m)   Wt 104.3 kg   SpO2 95% Comment: 2L [place]  BMI 39.47 kg/m  General:  Alert, oriented, calm, in no acute distress, resting comfortably on room air Cardiovascular: RRR, no murmurs or rubs, no peripheral edema  Respiratory: clear to auscultation bilaterally, no wheezes, no crackles  Abdomen: soft, nontender, nondistended, normal bowel tones heard  Skin: dry, no rashes  Musculoskeletal: no joint effusions, normal range of motion  Psychiatric: appropriate affect, normal speech  Neurologic: extraocular muscles intact, clear speech, moving all extremities with intact sensorium         Labs on Admission:  Basic Metabolic Panel: Recent Labs  Lab 12/08/23 0659  NA 139  K 4.1  CL 100  CO2 29  GLUCOSE 143*  BUN 10  CREATININE 0.98  CALCIUM 9.5   Liver Function Tests: Recent Labs  Lab 12/08/23 0659  AST 18  ALT 10  ALKPHOS 99  BILITOT 0.5  PROT 6.5  ALBUMIN 4.1   No results for input(s): LIPASE, AMYLASE in the last 168 hours. No results for input(s): AMMONIA in the last 168 hours. CBC: Recent Labs  Lab 12/08/23 0659  WBC 10.9*  NEUTROABS 9.1*  HGB 12.2  HCT 39.2  MCV 70.5*  PLT 191   Cardiac Enzymes: No results for input(s): CKTOTAL, CKMB, CKMBINDEX, TROPONINI in the last 168 hours. BNP (last 3 results) No results for input(s): BNP in the last 8760 hours.  ProBNP (last 3 results) No results for input(s): PROBNP in the last 8760 hours.  CBG: No results for input(s): GLUCAP in the last 168 hours.  Radiological Exams on Admission: CT T-SPINE NO CHARGE Addendum Date: 12/08/2023 ADDENDUM REPORT: 12/08/2023 09:17  ADDENDUM: Mild diffuse sclerosis with thickened trabeculae involving T10 unchanged from 2018 likely due to a hemangioma. Electronically Signed   By: Toribio Agreste M.D.   On: 12/08/2023 09:17   Result Date: 12/08/2023 CLINICAL DATA:  Fall this morning out of bed.  Injury to mid back. EXAM: CT THORACIC SPINE WITHOUT CONTRAST TECHNIQUE: Multidetector CT images of the thoracic were obtained using the standard protocol without intravenous contrast. RADIATION DOSE REDUCTION: This exam was performed according to the departmental dose-optimization program which includes automated exposure control, adjustment of the mA and/or kV according to patient size and/or use of iterative reconstruction technique. COMPARISON:  None Available. FINDINGS: Alignment: Normal. Vertebrae: Mild spondylosis throughout the thoracic spine. Vertebral body heights are maintained. No evidence of acute compression fracture. No significant neural foraminal narrowing. No evidence of spinal canal stenosis. Paraspinal and other soft tissues: Normal. Disc levels: Mild disc space narrowing at multiple levels of the mid to lower thoracic spine. IMPRESSION: 1. No acute findings. 2. Mild spondylosis throughout the thoracic spine. Electronically Signed: By: Toribio Agreste M.D. On: 12/08/2023 08:53   CT Chest Wo Contrast Result Date: 12/08/2023 CLINICAL DATA:  Fall this morning from bed. Blunt trauma to right back. EXAM: CT CHEST WITHOUT CONTRAST, CT ABDOMEN AND PELVIS WITH CONTRAST TECHNIQUE: Multidetector CT imaging of the chest was performed without contrast as CT abdomen and pelvis was performed following the standard protocol during bolus administration of intravenous contrast. RADIATION DOSE REDUCTION: This exam was performed according to the departmental dose-optimization program which includes automated exposure control, adjustment of the mA and/or kV according to patient size and/or use of iterative reconstruction technique. CONTRAST:  80mL  OMNIPAQUE  IOHEXOL  350 MG/ML SOLN COMPARISON:  CT chest 12/16/2022 and CT abdomen/pelvis 05/29/2022, chest CT 04/28/2015 FINDINGS: CT CHEST FINDINGS Cardiovascular: Heart is normal size. Thoracic aorta is normal in caliber. Pulmonary arterial system is unremarkable in this noncontrast exam. Remaining vascular structures are normal. Mediastinum/Nodes: No evidence of mediastinal hemorrhage. No adenopathy over the mediastinum or hilar regions. Few small calcified lymph nodes over the right hilum and subcarinal region. Remaining mediastinal structures are unremarkable. Lungs/Pleura: Lungs are adequately inflated without acute airspace process or effusion. No pneumothorax. 5-6 mm nodule over the left lower lobe unchanged from  2017 and therefore considered benign. Small airways are normal. 5 mm density along the right lateral wall of the lower trachea without significant change from 12/16/2022. Musculoskeletal: No acute fracture. Mild sclerosis of the the T10 vertebral body unchanged from 2018 likely due to hemangioma. CT ABDOMEN PELVIS FINDINGS Hepatobiliary: Previous cholecystectomy. Liver and biliary tree are normal. Pancreas: Normal. Spleen: Normal. Adrenals/Urinary Tract: Adrenal glands are normal. Kidneys are normal in size without hydronephrosis or nephrolithiasis. Ureters and bladder are normal. Stomach/Bowel: Stomach and small bowel are normal. Appendix is normal. Mild fecal retention throughout the colon which is otherwise unremarkable. Vascular/Lymphatic: Abdominal aorta is normal in caliber. Remaining vascular structures are unremarkable. No adenopathy. Reproductive: Status post hysterectomy. No adnexal masses. Other: No free fluid or free peritoneal air. Small umbilical hernia containing only peritoneal fat unchanged. Musculoskeletal: No acute fracture. IMPRESSION: 1. No acute findings in the chest, abdomen or pelvis. 2. 5 mm density along the right lateral wall of the lower trachea without significant change  from 12/16/2022. This may represent a small polyp. Recommend follow-up on a clinical basis. 3. 5-6 mm nodule over the left lower lobe unchanged from 2017 and therefore considered benign. 4. Small umbilical hernia containing only peritoneal fat unchanged. 5. 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 Electronically Signed   By: Toribio Agreste M.D.   On: 12/08/2023 09:16   CT ABDOMEN PELVIS W CONTRAST Result Date: 12/08/2023 CLINICAL DATA:  Fall this morning from bed. Blunt trauma to right back. EXAM: CT CHEST WITHOUT CONTRAST, CT ABDOMEN AND PELVIS WITH CONTRAST TECHNIQUE: Multidetector CT imaging of the chest was performed without contrast as CT abdomen and pelvis was performed following the standard protocol during bolus administration of intravenous contrast. RADIATION DOSE REDUCTION: This exam was performed according to the departmental dose-optimization program which includes automated exposure control, adjustment of the mA and/or kV according to patient size and/or use of iterative reconstruction technique. CONTRAST:  80mL OMNIPAQUE  IOHEXOL  350 MG/ML SOLN COMPARISON:  CT chest 12/16/2022 and CT abdomen/pelvis 05/29/2022, chest CT 04/28/2015 FINDINGS: CT CHEST FINDINGS Cardiovascular: Heart is normal size. Thoracic aorta is normal in caliber. Pulmonary arterial system is unremarkable in this noncontrast exam. Remaining vascular structures are normal. Mediastinum/Nodes: No evidence of mediastinal hemorrhage. No adenopathy over the mediastinum or hilar regions. Few small calcified lymph nodes over the right hilum and subcarinal region. Remaining mediastinal structures are unremarkable. Lungs/Pleura: Lungs are adequately inflated without acute airspace process or effusion. No pneumothorax. 5-6 mm nodule over the left lower lobe unchanged from 2017 and therefore considered benign. Small airways are normal. 5 mm density along the right lateral wall of the lower trachea without significant change  from 12/16/2022. Musculoskeletal: No acute fracture. Mild sclerosis of the the T10 vertebral body unchanged from 2018 likely due to hemangioma. CT ABDOMEN PELVIS FINDINGS Hepatobiliary: Previous cholecystectomy. Liver and biliary tree are normal. Pancreas: Normal. Spleen: Normal. Adrenals/Urinary Tract: Adrenal glands are normal. Kidneys are normal in size without hydronephrosis or nephrolithiasis. Ureters and bladder are normal. Stomach/Bowel: Stomach and small bowel are normal. Appendix is normal. Mild fecal retention throughout the colon which is otherwise unremarkable. Vascular/Lymphatic: Abdominal aorta is normal in caliber. Remaining vascular structures are unremarkable. No adenopathy. Reproductive: Status post hysterectomy. No adnexal masses. Other: No free fluid or free peritoneal air. Small umbilical hernia containing only peritoneal fat unchanged. Musculoskeletal: No acute fracture. IMPRESSION: 1. No acute findings in the chest, abdomen or pelvis. 2. 5 mm density along the right lateral wall of the  lower trachea without significant change from 12/16/2022. This may represent a small polyp. Recommend follow-up on a clinical basis. 3. 5-6 mm nodule over the left lower lobe unchanged from 2017 and therefore considered benign. 4. Small umbilical hernia containing only peritoneal fat unchanged. 5. 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 Electronically Signed   By: Toribio Agreste M.D.   On: 12/08/2023 09:16   CT Head Wo Contrast Result Date: 12/08/2023 CLINICAL DATA:  Fall this morning from bed. EXAM: CT HEAD WITHOUT CONTRAST CT CERVICAL SPINE WITHOUT CONTRAST TECHNIQUE: Multidetector CT imaging of the head and cervical spine was performed following the standard protocol without intravenous contrast. Multiplanar CT image reconstructions of the cervical spine were also generated. RADIATION DOSE REDUCTION: This exam was performed according to the departmental dose-optimization program which  includes automated exposure control, adjustment of the mA and/or kV according to patient size and/or use of iterative reconstruction technique. COMPARISON:  10/27/2020 FINDINGS: CT HEAD FINDINGS Brain: Ventricles, cisterns and other CSF spaces are normal. There is no mass, mass effect, shift of midline structures or acute hemorrhage. Mild chronic ischemic microvascular disease. Vascular: No hyperdense vessel or unexpected calcification. Skull: No acute fracture. Sinuses/Orbits: Orbits are normal symmetric. Paranasal sinuses are well developed and well aerated without air-fluid level. Mastoid air cells are clear. Other: None. CT CERVICAL SPINE FINDINGS Alignment: No posttraumatic subluxation. Skull base and vertebrae: Moderate spondylosis throughout the cervical spine to include uncovertebral joint spurring and facet arthropathy. Vertebral body heights are maintained. Non fusion of the midline posterior arch of C1. No evidence of acute fracture. Mild left-sided neural foraminal narrowing at the C3-4 and C4-5 levels. Bilateral moderate neural foraminal narrowing at the C5-6 level right worse than left. Mild left-sided neural foraminal narrowing at the C6-7 level. Soft tissues and spinal canal: Prevertebral soft tissues are normal. There is mild narrowing of the spinal canal in the AP dimension from the C3-4 level to the C5-6 level predominantly due to posterior spurring. Disc levels: Moderate disc space narrowing at the C3-4, C4-5 and C5-6 levels and also C6-7 level. Upper chest: No acute findings. Other: None. IMPRESSION: 1. No acute brain injury. 2. Mild chronic ischemic microvascular disease. 3. No acute cervical spine injury. 4. 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 Electronically Signed   By: Toribio Agreste M.D.   On: 12/08/2023 08:47   CT Cervical Spine Wo Contrast Result Date: 12/08/2023 CLINICAL  DATA:  Fall this morning from bed. EXAM: CT HEAD WITHOUT CONTRAST CT CERVICAL SPINE WITHOUT CONTRAST TECHNIQUE: Multidetector CT imaging of the head and cervical spine was performed following the standard protocol without intravenous contrast. Multiplanar CT image reconstructions of the cervical spine were also generated. RADIATION DOSE REDUCTION: This exam was performed according to the departmental dose-optimization program which includes automated exposure control, adjustment of the mA and/or kV according to patient size and/or use of iterative reconstruction technique. COMPARISON:  10/27/2020 FINDINGS: CT HEAD FINDINGS Brain: Ventricles, cisterns and other CSF spaces are normal. There is no mass, mass effect, shift of midline structures or acute hemorrhage. Mild chronic ischemic microvascular disease. Vascular: No hyperdense vessel or unexpected calcification. Skull: No acute fracture. Sinuses/Orbits: Orbits are normal symmetric. Paranasal sinuses are well developed and well aerated without air-fluid level. Mastoid air cells are clear. Other: None. CT CERVICAL SPINE FINDINGS Alignment: No posttraumatic subluxation. Skull base and vertebrae: Moderate spondylosis throughout the cervical spine to include uncovertebral joint  spurring and facet arthropathy. Vertebral body heights are maintained. Non fusion of the midline posterior arch of C1. No evidence of acute fracture. Mild left-sided neural foraminal narrowing at the C3-4 and C4-5 levels. Bilateral moderate neural foraminal narrowing at the C5-6 level right worse than left. Mild left-sided neural foraminal narrowing at the C6-7 level. Soft tissues and spinal canal: Prevertebral soft tissues are normal. There is mild narrowing of the spinal canal in the AP dimension from the C3-4 level to the C5-6 level predominantly due to posterior spurring. Disc levels: Moderate disc space narrowing at the C3-4, C4-5 and C5-6 levels and also C6-7 level. Upper chest: No acute  findings. Other: None. IMPRESSION: 1. No acute brain injury. 2. Mild chronic ischemic microvascular disease. 3. No acute cervical spine injury. 4. 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 Electronically Signed   By: Toribio Agreste M.D.   On: 12/08/2023 08:47   Assessment/Plan TIFFANNI SCARFO is a 68 y.o. female with medical history significant for non-insulin -dependent type 2 diabetes, GERD, hypertension, hyperlipidemia, schizophrenia and Parkinson's disease being admitted to the hospital with severe sepsis.  Severe sepsis-meeting criteria with fever, tachycardia, leukocytosis, suspected acute infection although source is not identified currently.  She does have a history of severe sepsis and ESBL UTI with bacteremia last year.  Initial lactate 2.1.  Extensive workup in the emergency department is negative for source. -Inpatient admission -Follow-up blood culture and urine cultures, as well as viral respiratory panel -Will place on empiric meropenem  given her history of ESBL infection -Continue fluid resuscitation, trend lactic acid  Type 2 diabetes-not insulin -dependent -Hold home glimepiride  and metformin  -Carb modified diet, with moderate dose sliding scale  Hypertension-Toprol -XL, losartan   Hyperlipidemia-Zocor   DVT prophylaxis: Lovenox      Code Status: Full Code  Consults called: None  Admission status: The appropriate patient status for this patient is INPATIENT. Inpatient status is judged to be reasonable and necessary in order to provide the required intensity of service to ensure the patient's safety. The patient's presenting symptoms, physical exam findings, and initial radiographic and laboratory data in the context of their chronic comorbidities is felt to place them at high risk for further clinical deterioration. Furthermore, it is not anticipated that the patient will be  medically stable for discharge from the hospital within 2 midnights of admission.    I certify that at the point of admission it is my clinical judgment that the patient will require inpatient hospital care spanning beyond 2 midnights from the point of admission due to high intensity of service, high risk for further deterioration and high frequency of surveillance required  Time spent: 59 minutes  Calissa Swenor CHRISTELLA Gail MD [facility] Pager [phone]  If 7PM-7AM, please contact night-coverage [email or web address] Password [email or web address]  12/08/2023, 11:14 AM

## 2023-12-08 NOTE — ED Notes (Signed)
 In and out complete. Pt tolerated well. New brief applied. Pt repositioned. CB within reach.

## 2023-12-08 NOTE — ED Notes (Signed)
 Patient reports received a flu shot yesterday

## 2023-12-08 NOTE — ED Notes (Signed)
Will draw lactic after fluids finish

## 2023-12-08 NOTE — Consult Note (Signed)
 ED Pharmacy Antibiotic Sign Off An antibiotic consult was received from an ED provider for Meropenem  per pharmacy dosing for sepsis on patient with prior hx of ESBL infection. A chart review was completed to assess appropriateness.   The following one time order(s) were placed:  Meropenem  1gm IV  Further antibiotic and/or antibiotic pharmacy consults should be ordered by the admitting provider if indicated.   Thank you for allowing pharmacy to be a part of this patient's care.   Quay Simkin Rodriguez-Guzman PharmD, BCPS 12/08/2023 11:05 AM

## 2023-12-08 NOTE — ED Notes (Signed)
 This tech initiated fall bundle, pt educated on use of call bell and pt verbalized understanding.

## 2023-12-08 NOTE — Progress Notes (Signed)
 PHARMACIST - PHYSICIAN COMMUNICATION  CONCERNING:  Enoxaparin  (Lovenox ) for DVT Prophylaxis    RECOMMENDATION: Patient was prescribed enoxaprin 40mg  q24 hours for VTE prophylaxis.   Filed Weights   12/08/23 0647 12/08/23 0747  Weight: 104.3 kg (230 lb) 104.3 kg (229 lb 15 oz)    Body mass index is 39.47 kg/m.  Estimated Creatinine Clearance: 62.8 mL/min (by C-G formula based on SCr of 0.98 mg/dL).   Based on [facility] policy patient is candidate for enoxaparin  0.5mg /kg TBW SQ every 24 hours based on BMI being >30.   DESCRIPTION: Pharmacy has adjusted enoxaparin  dose per [facility] policy.  Patient is now receiving enoxaparin  50 mg every 24 hours   Estill CHRISTELLA Lutes, PharmD, BCPS Clinical Pharmacist 12/08/2023 11:18 AM

## 2023-12-08 NOTE — ED Provider Notes (Signed)
 [facility] Provider Note    Event Date/Time   First MD Initiated Contact with Patient 12/08/23 0701     (approximate)   History   Fall   HPI  LEANNA HAMID is a 68 y.o. female history of diabetes, hypertension hyperlipidemia schizophrenia asthma  Patient she reports she fell while getting up out of bed today.  She fell sort of striking her furniture or nightstand with her right mid back.  She reports pain over the right side of her back but not right in the middle of her spine.  Runs out slightly along her ribs over the middle of the right back.  She does not think she struck her head hard  She has no pain in her hips or arms.  She reports what happened was that her bed mattress sort of slid as she was getting up causing her to fall striking her furniture but the mattress was also present somewhat protecting her fall.  No neck pain no lateral back pain no injury to the arms or legs.  Forts mostly pain along the right mid back and along the ribs    Past Medical History:  Diagnosis Date   Arthritis    Asthma    Dementia with Lewy bodies (HCC)    Diabetes mellitus without complication (HCC)    Patient takes Metformin    Drug induced subacute dyskinesia    Dysphagia    Edema    Genital herpes    GERD (gastroesophageal reflux disease)    Hyperlipidemia    Hyperlipidemia    Hypertension    Malaise and fatigue    Mental status alteration    Overactive bladder    Parkinson's disease (HCC)    Schizophrenia (HCC)    Sleep apnea    EMS reported the patient to be febrile about 102 Fahrenheit.  She was reported had a fall  Physical Exam   Triage Vital Signs: ED Triage Vitals  Encounter Vitals Group     BP 12/08/23 0646 (!) 141/83     Girls Systolic BP Percentile --      Girls Diastolic BP Percentile --      Boys Systolic BP Percentile --      Boys Diastolic BP Percentile --      Pulse Rate 12/08/23 0646 (!) 114     Resp 12/08/23 0646 20      Temp 12/08/23 0646 (!) 101.5 F (38.6 C)     Temp Source 12/08/23 0646 Oral     SpO2 12/08/23 0646 93 %     Weight 12/08/23 0647 230 lb (104.3 kg)     Height --      Head Circumference --      Peak Flow --      Pain Score 12/08/23 0647 10     Pain Loc --      Pain Education --      Exclude from Growth Chart --     Most recent vital signs: Vitals:   12/08/23 1130 12/08/23 1338  BP: 126/62 109/68  Pulse: 85 97  Resp: 16 18  Temp:  98.8 F (37.1 C)  SpO2: 94% 94%     General: Awake, no distress.  Normocephalic atraumatic very pleasant.  Does not appear in acute distress.  Patient has tremulous like movements in both arms, reports this is chronic as a side effect of her old psychiatric medicine CV:  Good peripheral perfusion.  Slight tachycardia no murmur Resp:  Normal  effort.  Clear lungs bilaterally.  Examined front chest back wall flanks etc. for evidence of injury by visual inspection and palpation.  She reports tenderness primarily along the mid thoracic right paraspinous region with slight pain over the right lateral ribs posteriorly.  There is no deformity or bruising. Abd:  No distention.  Soft nontender on the Other:  Moves extremities, able to range hips through range of motion as well as arms without pain or discomfort in any of the major joints.  There is no shortening or rotation   ED Results / Procedures / Treatments   Labs (all labs ordered are listed, but only abnormal results are displayed) Labs Reviewed  COMPREHENSIVE METABOLIC PANEL WITH GFR - Abnormal; Notable for the following components:      Result Value   Glucose, Bld 143 (*)    All other components within normal limits  LACTIC ACID, PLASMA - Abnormal; Notable for the following components:   Lactic Acid, Venous 2.1 (*)    All other components within normal limits  CBC WITH DIFFERENTIAL/PLATELET - Abnormal; Notable for the following components:   WBC 10.9 (*)    RBC 5.56 (*)    MCV 70.5 (*)    MCH  21.9 (*)    RDW 16.6 (*)    Neutro Abs 9.1 (*)    Lymphs Abs 0.5 (*)    Monocytes Absolute 1.1 (*)    All other components within normal limits  URINALYSIS, W/ REFLEX TO CULTURE (INFECTION SUSPECTED) - Abnormal; Notable for the following components:   Color, Urine YELLOW (*)    APPearance CLEAR (*)    Specific Gravity, Urine 1.033 (*)    All other components within normal limits  HEMOGLOBIN A1C - Abnormal; Notable for the following components:   Hgb A1c MFr Bld 7.6 (*)    All other components within normal limits  GLUCOSE, CAPILLARY - Abnormal; Notable for the following components:   Glucose-Capillary 197 (*)    All other components within normal limits  RESP PANEL BY RT-PCR (RSV, FLU A&B, COVID)  RVPGX2  CULTURE, BLOOD (ROUTINE X 2)  CULTURE, BLOOD (ROUTINE X 2)  RESPIRATORY PANEL BY PCR  URINE CULTURE  MRSA NEXT GEN BY PCR, NASAL  LACTIC ACID, PLASMA  PROTIME-INR   Labs demonstrate left shift leukocytosis.  She is also noted to be febrile and slightly tachypneic.  EKG  Inter by me at 6:59 AM heart rate 110 QRS 100 QTc 460 Sinus tachycardia.  No distinct acute abnormalities, patient's tremor slightly present throughout this ECG as well.   RADIOLOGY  CT T-SPINE NO CHARGE Addendum Date: 12/08/2023 ADDENDUM REPORT: 12/08/2023 09:17 ADDENDUM: Mild diffuse sclerosis with thickened trabeculae involving T10 unchanged from 2018 likely due to a hemangioma. Electronically Signed   By: Toribio Agreste M.D.   On: 12/08/2023 09:17   Result Date: 12/08/2023 CLINICAL DATA:  Fall this morning out of bed.  Injury to mid back. EXAM: CT THORACIC SPINE WITHOUT CONTRAST TECHNIQUE: Multidetector CT images of the thoracic were obtained using the standard protocol without intravenous contrast. RADIATION DOSE REDUCTION: This exam was performed according to the departmental dose-optimization program which includes automated exposure control, adjustment of the mA and/or kV according to patient size  and/or use of iterative reconstruction technique. COMPARISON:  None Available. FINDINGS: Alignment: Normal. Vertebrae: Mild spondylosis throughout the thoracic spine. Vertebral body heights are maintained. No evidence of acute compression fracture. No significant neural foraminal narrowing. No evidence of spinal canal stenosis. Paraspinal and other soft  tissues: Normal. Disc levels: Mild disc space narrowing at multiple levels of the mid to lower thoracic spine. IMPRESSION: 1. No acute findings. 2. Mild spondylosis throughout the thoracic spine. Electronically Signed: By: Toribio Agreste M.D. On: 12/08/2023 08:53   CT Chest Wo Contrast Result Date: 12/08/2023 CLINICAL DATA:  Fall this morning from bed. Blunt trauma to right back. EXAM: CT CHEST WITHOUT CONTRAST, CT ABDOMEN AND PELVIS WITH CONTRAST TECHNIQUE: Multidetector CT imaging of the chest was performed without contrast as CT abdomen and pelvis was performed following the standard protocol during bolus administration of intravenous contrast. RADIATION DOSE REDUCTION: This exam was performed according to the departmental dose-optimization program which includes automated exposure control, adjustment of the mA and/or kV according to patient size and/or use of iterative reconstruction technique. CONTRAST:  80mL OMNIPAQUE  IOHEXOL  350 MG/ML SOLN COMPARISON:  CT chest 12/16/2022 and CT abdomen/pelvis 05/29/2022, chest CT 04/28/2015 FINDINGS: CT CHEST FINDINGS Cardiovascular: Heart is normal size. Thoracic aorta is normal in caliber. Pulmonary arterial system is unremarkable in this noncontrast exam. Remaining vascular structures are normal. Mediastinum/Nodes: No evidence of mediastinal hemorrhage. No adenopathy over the mediastinum or hilar regions. Few small calcified lymph nodes over the right hilum and subcarinal region. Remaining mediastinal structures are unremarkable. Lungs/Pleura: Lungs are adequately inflated without acute airspace process or effusion. No  pneumothorax. 5-6 mm nodule over the left lower lobe unchanged from 2017 and therefore considered benign. Small airways are normal. 5 mm density along the right lateral wall of the lower trachea without significant change from 12/16/2022. Musculoskeletal: No acute fracture. Mild sclerosis of the the T10 vertebral body unchanged from 2018 likely due to hemangioma. CT ABDOMEN PELVIS FINDINGS Hepatobiliary: Previous cholecystectomy. Liver and biliary tree are normal. Pancreas: Normal. Spleen: Normal. Adrenals/Urinary Tract: Adrenal glands are normal. Kidneys are normal in size without hydronephrosis or nephrolithiasis. Ureters and bladder are normal. Stomach/Bowel: Stomach and small bowel are normal. Appendix is normal. Mild fecal retention throughout the colon which is otherwise unremarkable. Vascular/Lymphatic: Abdominal aorta is normal in caliber. Remaining vascular structures are unremarkable. No adenopathy. Reproductive: Status post hysterectomy. No adnexal masses. Other: No free fluid or free peritoneal air. Small umbilical hernia containing only peritoneal fat unchanged. Musculoskeletal: No acute fracture. IMPRESSION: 1. No acute findings in the chest, abdomen or pelvis. 2. 5 mm density along the right lateral wall of the lower trachea without significant change from 12/16/2022. This may represent a small polyp. Recommend follow-up on a clinical basis. 3. 5-6 mm nodule over the left lower lobe unchanged from 2017 and therefore considered benign. 4. Small umbilical hernia containing only peritoneal fat unchanged. 5. 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 Electronically Signed   By: Toribio Agreste M.D.   On: 12/08/2023 09:16   CT ABDOMEN PELVIS W CONTRAST Result Date: 12/08/2023 CLINICAL DATA:  Fall this morning from bed. Blunt trauma to right back. EXAM: CT CHEST WITHOUT CONTRAST, CT ABDOMEN AND PELVIS WITH CONTRAST TECHNIQUE: Multidetector CT imaging of the chest was performed without  contrast as CT abdomen and pelvis was performed following the standard protocol during bolus administration of intravenous contrast. RADIATION DOSE REDUCTION: This exam was performed according to the departmental dose-optimization program which includes automated exposure control, adjustment of the mA and/or kV according to patient size and/or use of iterative reconstruction technique. CONTRAST:  80mL OMNIPAQUE  IOHEXOL  350 MG/ML SOLN COMPARISON:  CT chest 12/16/2022 and CT abdomen/pelvis 05/29/2022, chest CT 04/28/2015 FINDINGS: CT CHEST FINDINGS Cardiovascular: Heart is normal size. Thoracic  aorta is normal in caliber. Pulmonary arterial system is unremarkable in this noncontrast exam. Remaining vascular structures are normal. Mediastinum/Nodes: No evidence of mediastinal hemorrhage. No adenopathy over the mediastinum or hilar regions. Few small calcified lymph nodes over the right hilum and subcarinal region. Remaining mediastinal structures are unremarkable. Lungs/Pleura: Lungs are adequately inflated without acute airspace process or effusion. No pneumothorax. 5-6 mm nodule over the left lower lobe unchanged from 2017 and therefore considered benign. Small airways are normal. 5 mm density along the right lateral wall of the lower trachea without significant change from 12/16/2022. Musculoskeletal: No acute fracture. Mild sclerosis of the the T10 vertebral body unchanged from 2018 likely due to hemangioma. CT ABDOMEN PELVIS FINDINGS Hepatobiliary: Previous cholecystectomy. Liver and biliary tree are normal. Pancreas: Normal. Spleen: Normal. Adrenals/Urinary Tract: Adrenal glands are normal. Kidneys are normal in size without hydronephrosis or nephrolithiasis. Ureters and bladder are normal. Stomach/Bowel: Stomach and small bowel are normal. Appendix is normal. Mild fecal retention throughout the colon which is otherwise unremarkable. Vascular/Lymphatic: Abdominal aorta is normal in caliber. Remaining vascular  structures are unremarkable. No adenopathy. Reproductive: Status post hysterectomy. No adnexal masses. Other: No free fluid or free peritoneal air. Small umbilical hernia containing only peritoneal fat unchanged. Musculoskeletal: No acute fracture. IMPRESSION: 1. No acute findings in the chest, abdomen or pelvis. 2. 5 mm density along the right lateral wall of the lower trachea without significant change from 12/16/2022. This may represent a small polyp. Recommend follow-up on a clinical basis. 3. 5-6 mm nodule over the left lower lobe unchanged from 2017 and therefore considered benign. 4. Small umbilical hernia containing only peritoneal fat unchanged. 5. 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 Electronically Signed   By: Toribio Agreste M.D.   On: 12/08/2023 09:16   CT Head Wo Contrast Result Date: 12/08/2023 CLINICAL DATA:  Fall this morning from bed. EXAM: CT HEAD WITHOUT CONTRAST CT CERVICAL SPINE WITHOUT CONTRAST TECHNIQUE: Multidetector CT imaging of the head and cervical spine was performed following the standard protocol without intravenous contrast. Multiplanar CT image reconstructions of the cervical spine were also generated. RADIATION DOSE REDUCTION: This exam was performed according to the departmental dose-optimization program which includes automated exposure control, adjustment of the mA and/or kV according to patient size and/or use of iterative reconstruction technique. COMPARISON:  10/27/2020 FINDINGS: CT HEAD FINDINGS Brain: Ventricles, cisterns and other CSF spaces are normal. There is no mass, mass effect, shift of midline structures or acute hemorrhage. Mild chronic ischemic microvascular disease. Vascular: No hyperdense vessel or unexpected calcification. Skull: No acute fracture. Sinuses/Orbits: Orbits are normal symmetric. Paranasal sinuses are well developed and well aerated without air-fluid level. Mastoid air cells are clear. Other: None. CT CERVICAL SPINE  FINDINGS Alignment: No posttraumatic subluxation. Skull base and vertebrae: Moderate spondylosis throughout the cervical spine to include uncovertebral joint spurring and facet arthropathy. Vertebral body heights are maintained. Non fusion of the midline posterior arch of C1. No evidence of acute fracture. Mild left-sided neural foraminal narrowing at the C3-4 and C4-5 levels. Bilateral moderate neural foraminal narrowing at the C5-6 level right worse than left. Mild left-sided neural foraminal narrowing at the C6-7 level. Soft tissues and spinal canal: Prevertebral soft tissues are normal. There is mild narrowing of the spinal canal in the AP dimension from the C3-4 level to the C5-6 level predominantly due to posterior spurring. Disc levels: Moderate disc space narrowing at the C3-4, C4-5 and C5-6 levels and also C6-7 level. Upper chest:  No acute findings. Other: None. IMPRESSION: 1. No acute brain injury. 2. Mild chronic ischemic microvascular disease. 3. No acute cervical spine injury. 4. 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 Electronically Signed   By: Toribio Agreste M.D.   On: 12/08/2023 08:47   CT Cervical Spine Wo Contrast Result Date: 12/08/2023 CLINICAL DATA:  Fall this morning from bed. EXAM: CT HEAD WITHOUT CONTRAST CT CERVICAL SPINE WITHOUT CONTRAST TECHNIQUE: Multidetector CT imaging of the head and cervical spine was performed following the standard protocol without intravenous contrast. Multiplanar CT image reconstructions of the cervical spine were also generated. RADIATION DOSE REDUCTION: This exam was performed according to the departmental dose-optimization program which includes automated exposure control, adjustment of the mA and/or kV according to patient size and/or use of iterative reconstruction technique. COMPARISON:  10/27/2020 FINDINGS: CT HEAD FINDINGS Brain: Ventricles,  cisterns and other CSF spaces are normal. There is no mass, mass effect, shift of midline structures or acute hemorrhage. Mild chronic ischemic microvascular disease. Vascular: No hyperdense vessel or unexpected calcification. Skull: No acute fracture. Sinuses/Orbits: Orbits are normal symmetric. Paranasal sinuses are well developed and well aerated without air-fluid level. Mastoid air cells are clear. Other: None. CT CERVICAL SPINE FINDINGS Alignment: No posttraumatic subluxation. Skull base and vertebrae: Moderate spondylosis throughout the cervical spine to include uncovertebral joint spurring and facet arthropathy. Vertebral body heights are maintained. Non fusion of the midline posterior arch of C1. No evidence of acute fracture. Mild left-sided neural foraminal narrowing at the C3-4 and C4-5 levels. Bilateral moderate neural foraminal narrowing at the C5-6 level right worse than left. Mild left-sided neural foraminal narrowing at the C6-7 level. Soft tissues and spinal canal: Prevertebral soft tissues are normal. There is mild narrowing of the spinal canal in the AP dimension from the C3-4 level to the C5-6 level predominantly due to posterior spurring. Disc levels: Moderate disc space narrowing at the C3-4, C4-5 and C5-6 levels and also C6-7 level. Upper chest: No acute findings. Other: None. IMPRESSION: 1. No acute brain injury. 2. Mild chronic ischemic microvascular disease. 3. No acute cervical spine injury. 4. 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 Electronically Signed   By: Toribio Agreste M.D.   On: 12/08/2023 08:47      PROCEDURES:  Critical Care performed: No  Procedures   IMPRESSION / MDM / ASSESSMENT AND PLAN / ED COURSE  I reviewed the triage vital signs and the nursing notes.                              Differential diagnosis includes, but is not limited to, fall possibly  mechanical nature she reports that mattress slid off the bed causing her to fall but also noted is she is febrile tachycardic and slightly tachypneic.  She reports a history of urinary tract infections often in the past sometimes without obvious symptoms.  Will check urinalysis.  Biz because of the tachypnea and also the location of injury and her age imaging of the chest head and neck are performed which are reassuring without acute injury or pneumonia or infiltrate.  Additionally given her febrile status with otherwise unknown cause CT of the abdomen pelvis is also performed and also in consideration to evaluate and exclude liver injury though seemingly highly unlikely.  Her CT of the abdomen pelvis  does not reveal source either.    Patient's presentation is most consistent with acute presentation with potential threat to life or bodily function.      Clinical Course as of 12/08/23 1646  Thu Dec 08, 2023  9143 Sepsis workup ongoing.  No source yet identified.  Lactic acid slightly increased.  Does not show evidence of acute severe hypotension or septic shock at this time.  Will give small fluid bolus [MQ]  1022 All workup thus far no clear source.  No headache no meningeal symptoms.  No obvious chest symptoms.  She is quite febrile tachycardic left shift.  Concern for bacterial infection or other acute infectious illness is elevated.  COVID and influenza testing negative.  At this juncture urinalysis also appears normal.  No obvious source.  Will discuss with and consult with hospitalist service as she does have evidence of SIRS, and infection seems likelihood [MQ]    Clinical Course User Index [MQ] Dicky Anes, MD   Given the patient's fall now also with findings concerning for SIRS, consulted with discussed case with our hospitalist.  Her workup thus far does not clearly identify a bacterial cause but she does have a history of E. coli bacteremia in the past.  Discussed with her hospitalist,  will start the patient empirically on meropenem  as they continue to follow and evaluate as to cause rule out sepsis etc.  Admitted to  Dr. Zella   FINAL CLINICAL IMPRESSION(S) / ED DIAGNOSES   Final diagnoses:  SIRS (systemic inflammatory response syndrome) (HCC)  Fall, initial encounter     Rx / DC Orders   ED Discharge Orders     None        Note:  This document was prepared using Dragon voice recognition software and may include unintentional dictation errors.   Dicky Anes, MD 12/08/23 [phone]

## 2023-12-09 ENCOUNTER — Inpatient Hospital Stay
Admit: 2023-12-09 | Discharge: 2023-12-09 | Disposition: A | Discharge status: Home / Self Care | Attending: Student | Admitting: Student

## 2023-12-09 DIAGNOSIS — A419 Sepsis, unspecified organism: Secondary | ICD-10-CM | POA: Diagnosis not present

## 2023-12-09 DIAGNOSIS — R6520 Severe sepsis without septic shock: Secondary | ICD-10-CM | POA: Diagnosis not present

## 2023-12-09 LAB — GLUCOSE, CAPILLARY
Glucose-Capillary: 119 mg/dL — ABNORMAL HIGH (ref 70–99)
Glucose-Capillary: 166 mg/dL — ABNORMAL HIGH (ref 70–99)
Glucose-Capillary: 196 mg/dL — ABNORMAL HIGH (ref 70–99)
Glucose-Capillary: 134 mg/dL — ABNORMAL HIGH (ref 70–99)

## 2023-12-09 LAB — CBC
HCT: 37.9 % (ref 36.0–46.0)
Hemoglobin: 11.7 g/dL — ABNORMAL LOW (ref 12.0–15.0)
MCH: 21.7 pg — ABNORMAL LOW (ref 26.0–34.0)
MCHC: 30.9 g/dL (ref 30.0–36.0)
MCV: 70.2 fL — ABNORMAL LOW (ref 80.0–100.0)
Platelets: 180 K/uL (ref 150–400)
RBC: 5.4 MIL/uL — ABNORMAL HIGH (ref 3.87–5.11)
RDW: 16.9 % — ABNORMAL HIGH (ref 11.5–15.5)
WBC: 5.4 K/uL (ref 4.0–10.5)
nRBC: 0 % (ref 0.0–0.2)

## 2023-12-09 LAB — PHOSPHORUS: Phosphorus: 3.8 mg/dL (ref 2.5–4.6)

## 2023-12-09 LAB — BASIC METABOLIC PANEL WITH GFR
Anion gap: 10 (ref 5–15)
BUN: 8 mg/dL (ref 8–23)
CO2: 28 mmol/L (ref 22–32)
Calcium: 9.2 mg/dL (ref 8.9–10.3)
Chloride: 101 mmol/L (ref 98–111)
Creatinine, Ser: 0.81 mg/dL (ref 0.44–1.00)
GFR, Estimated: >60 mL/min (ref >60)
Glucose, Bld: 130 mg/dL — ABNORMAL HIGH (ref 70–99)
Potassium: 3.7 mmol/L (ref 3.5–5.1)
Sodium: 140 mmol/L (ref 135–145)

## 2023-12-09 LAB — MAGNESIUM: Magnesium: 2.4 mg/dL (ref 1.7–2.4)

## 2023-12-09 LAB — URINE CULTURE: Culture: NO GROWTH

## 2023-12-09 MED ORDER — BISACODYL 5 MG PO TBEC
10.0000 mg | DELAYED_RELEASE_TABLET | Freq: Every day | ORAL | Status: DC
  Administered 2023-12-10: 10 mg via ORAL
  Filled 2023-12-09: qty 2

## 2023-12-09 MED ORDER — BISACODYL 10 MG RE SUPP
10.0000 mg | Freq: Once | RECTAL | Status: AC
  Administered 2023-12-09: 10 mg via RECTAL
  Filled 2023-12-09: qty 1

## 2023-12-09 MED ORDER — BISACODYL 10 MG RE SUPP
10.0000 mg | Freq: Every day | RECTAL | Status: DC | PRN
Start: 2023-12-09 — End: 2023-12-11

## 2023-12-09 MED ORDER — POLYETHYLENE GLYCOL 3350 17 G PO PACK
17.0000 g | PACK | Freq: Two times a day (BID) | ORAL | Status: DC
  Administered 2023-12-09 – 2023-12-11 (×5): 17 g via ORAL
  Filled 2023-12-09 (×5): qty 1

## 2023-12-09 MED ORDER — SMOG ENEMA
960.0000 mL | Freq: Once | RECTAL | Status: AC
  Administered 2023-12-10: 960 mL via RECTAL
  Filled 2023-12-09: qty 960

## 2023-12-09 NOTE — Progress Notes (Signed)
 PT Cancellation Note  Patient Details Name: Christy [name] MRN: [id number] DOB: [birth year]   Cancelled Treatment:    Reason Eval/Treat Not Completed: Patient at procedure or test/unavailable. Twice attempted to evaluate patient, both times pt unavailable. WIll attempt again at later date/time.  3:05 PM, 12/09/23 [name] Linear, PT, DPT Physical Therapist - [facility]  [phone] (ASCOM)    [name] 12/09/2023, 3:05 PM

## 2023-12-09 NOTE — Plan of Care (Signed)

## 2023-12-09 NOTE — Progress Notes (Signed)
 [facility] Progress Note  Patient: Christy [name]    FMW:[id number]  DOA: 12/08/2023     Date of Service: the patient was seen and examined on 12/09/2023  Chief Complaint  Patient presents with   Fall   Brief hospital course: YITTA GONGAWARE is a 68 y.o. female with medical history significant for non-insulin -dependent type 2 diabetes, GERD, hypertension, hyperlipidemia, schizophrenia and Parkinson's disease being admitted to the hospital with severe sepsis.  Patient lives in assisted living facility, this morning states that she fell out of bed, the mattress bounced her out of the bed.  She denies any recent illness, though she had a slight cough for the last 24 hours which was nonproductive.  She denies any known fevers, chills, nausea, vomiting, sick contacts.  Specifically denies any dysuria, abdominal pain, diarrhea or any other changes in bowel or bladder habits.  On arrival to emergency department, the patient was noted to be tachycardic, febrile and lab workup shows minimally elevated leukocytosis and lactic acid.  She was referred for hospitalist admission.    Assessment and Plan:  # Severe sepsis-meeting criteria with fever, tachycardia, leukocytosis, suspected acute infection although source is not identified currently.  She does have a history of severe sepsis and ESBL UTI with bacteremia last year.  Initial lactate 2.1.  Extensive workup in the emergency department is negative for source. -Blood culture NGTD  - urine cultures No growth  -viral respiratory panel negative - Continue empiric meropenem  given her history of ESBL infection -s/p IV fluid resuscitation, trend lactic acid 2.1>>1.1 Follow-up TTE to rule out endocarditis   # Type 2 diabetes-not insulin -dependent HbA1c 7.6, well-controlled -Hold home glimepiride  and metformin  -Carb modified diet, with moderate dose sliding scale Semglee 18 units nightly   # Hypertension-> Hypotension  11/14 Held Toprol -XL,  losartan  Monitor BP and titrate medications accordingly  # Hyperlipidemia-Zocor   # Constipation Started MiraLAX twice daily Dulcolax 10 mg p.o. nightly 11/14 as per patient she had BM more than 7 days ago Patient agreed for Dulcolax suppository followed by enema if no BM  # History of Parkinson disease Currently patient not on Sinemet Advised to follow with neurology for further management as an outpatient Resumed Primatene and gabapentin  home dose  # Schizophrenia Continue to fluphenazine 1 mg p.o. 3 times daily home dose  # Gout, continue allopurinol  Body mass index is 39.47 kg/m.  Interventions:  Diet: Carb modified diet DVT Prophylaxis: Subcutaneous Lovenox    Advance goals of care discussion: Full code  Family Communication: family was not present at bedside, at the time of interview.  The pt provided permission to discuss medical plan with the family. Opportunity was given to ask question and all questions were answered satisfactorily.   Disposition:  Pt is from ALF, admitted with fall and sepsis, still on IV abx, which precludes a safe discharge. Discharge to ALF vs SNF TBD after PT/OT eval, when stable, may need few days to improve.  Subjective: No significant events overnight, patient says that she fell last when she came to the hospital, still having some pain in the right hip area 4/10, no any other complaints.  Patient has significant constipation, last BM was more than a week ago.  No any abdominal pain.  Denies any dysuria. No chest pain or palpitation, no shortness of breath.  Physical Exam: General: NAD, lying comfortably Appear in no distress, affect appropriate Eyes: PERRLA ENT: Oral Mucosa Clear, moist  Neck: no JVD,  Cardiovascular: S1 and S2 Present,  no Murmur,  Respiratory: good respiratory effort, Bilateral Air entry equal and Decreased, no Crackles, no wheezes Abdomen: Bowel Sound present, Soft and no tenderness,  Skin: no rashes Extremities:  no Pedal edema, no calf tenderness Neurologic: without any new focal findings, resting tremors noticed. Gait not checked due to patient safety concerns  Vitals:   12/08/23 1130 12/08/23 1338 12/08/23 2010 12/09/23 0757  BP: 126/62 109/68 137/87 98/71  Pulse: 85 97 61 80  Resp: 16 18 19 19   Temp:  98.8 F (37.1 C) 97.6 F (36.4 C) 98.8 F (37.1 C)  TempSrc:  Oral Oral Oral  SpO2: 94% 94% 96% 100%  Weight:      Height:        Intake/Output Summary (Last 24 hours) at 12/09/2023 1623 Last data filed at 12/09/2023 1600 Gross per 24 hour  Intake 243.39 ml  Output --  Net 243.39 ml   Filed Weights   12/08/23 0647 12/08/23 0747  Weight: 104.3 kg 104.3 kg    Data Reviewed: I have personally reviewed and interpreted daily labs, tele strips, imagings as discussed above. I reviewed all nursing notes, pharmacy notes, vitals, pertinent old records I have discussed plan of care as described above with RN and patient/family.  CBC: Recent Labs  Lab 12/08/23 0659 12/09/23 0419  WBC 10.9* 5.4  NEUTROABS 9.1*  --   HGB 12.2 11.7*  HCT 39.2 37.9  MCV 70.5* 70.2*  PLT 191 180   Basic Metabolic Panel: Recent Labs  Lab 12/08/23 0659 12/09/23 0419  NA 139 140  K 4.1 3.7  CL 100 101  CO2 29 28  GLUCOSE 143* 130*  BUN 10 8  CREATININE 0.98 0.81  CALCIUM 9.5 9.2  MG  --  2.4  PHOS  --  3.8    Studies: No results found.  Scheduled Meds:  allopurinol  100 mg Oral Daily   aspirin  EC  81 mg Oral Daily   [START ON 12/10/2023] bisacodyl   10 mg Oral QHS   bisacodyl   10 mg Rectal Once   enoxaparin  (LOVENOX ) injection  50 mg Subcutaneous Q24H   ferrous sulfate  325 mg Oral Q breakfast   fluPHENAZine  1 mg Oral TID   folic acid  1 mg Oral Daily   gabapentin   100 mg Oral QHS   insulin  aspart  0-15 Units Subcutaneous TID WC   insulin  aspart  0-5 Units Subcutaneous QHS   insulin  glargine-yfgn  18 Units Subcutaneous QHS   pantoprazole   40 mg Oral Daily   polyethylene glycol  17  g Oral BID   primidone  12.5 mg Oral QHS   simvastatin   10 mg Oral Daily   SMOG  960 mL Rectal Once   Continuous Infusions:  meropenem  (MERREM ) IV Stopped (12/09/23 1425)   PRN Meds: acetaminophen  **OR** acetaminophen , albuterol , bisacodyl , ondansetron  **OR** ondansetron  (ZOFRAN ) IV, oxyCODONE, traZODone   Time spent: 55 minutes  Author: ELVAN SOR. MD [facility] 12/09/2023 4:23 PM  To reach On-call, see care teams to locate the attending and reach out to them via [email or web address]. If 7PM-7AM, please contact night-coverage If you still have difficulty reaching the attending provider, please page the [facility] (Director on Call) for [facility] on amion for assistance.

## 2023-12-09 NOTE — TOC CM/SW Note (Signed)
 Transition of Care [facility]) CM/SW Note   Transition of Care [facility]) - Inpatient Brief Assessment   Patient Details  Name: Christy [name] MRN: [id number] Date of Birth: 03-Oct-1955  [facility]) CM/SW Contact:    Alfonso Rummer, LCSW Phone Number: 12/09/2023, 10:53 AM   Clinical Narrative: LCSW A Figueora completed toc chart review. No tocs needs identified please contact should needs arise    Transition of Care Asessment: Insurance and Status: Insurance coverage has been reviewed Patient has primary care physician: Yes (GILBERT, RICHARD L) Home environment has been reviewed: single family home   Prior/Current Home Services: Current home services (pt lives at assist living facility) Social Drivers of Health Review: SDOH reviewed no interventions necessary Readmission risk has been reviewed: No Transition of care needs: no transition of care needs at this time

## 2023-12-09 NOTE — Progress Notes (Signed)
*  PRELIMINARY RESULTS* Echocardiogram 2D Echocardiogram has been performed.  Floydene Harder 12/09/2023, 1:57 PM

## 2023-12-09 NOTE — Progress Notes (Signed)
 OT Cancellation Note  Patient Details Name: Christy [name] MRN: [id number] DOB: 19-Nov-1955   Cancelled Treatment:    Reason Eval/Treat Not Completed: Patient declined, no reason specified;Pain limiting ability to participate. Pt politely declining upon initial attempt 2/2 pain. Just received pain meds and had not yet kicked in. Will re-attempt at later date/time as able.  [name], MPH, MS, OTR/L ascom [phone] 12/09/23, 12:32 PM

## 2023-12-10 ENCOUNTER — Other Ambulatory Visit: Payer: Self-pay

## 2023-12-10 DIAGNOSIS — R6520 Severe sepsis without septic shock: Secondary | ICD-10-CM | POA: Diagnosis not present

## 2023-12-10 DIAGNOSIS — A419 Sepsis, unspecified organism: Secondary | ICD-10-CM | POA: Diagnosis not present

## 2023-12-10 LAB — GLUCOSE, CAPILLARY
Glucose-Capillary: 133 mg/dL — ABNORMAL HIGH (ref 70–99)
Glucose-Capillary: 151 mg/dL — ABNORMAL HIGH (ref 70–99)
Glucose-Capillary: 123 mg/dL — ABNORMAL HIGH (ref 70–99)
Glucose-Capillary: 161 mg/dL — ABNORMAL HIGH (ref 70–99)

## 2023-12-10 LAB — CBC
HCT: 37.8 % (ref 36.0–46.0)
Hemoglobin: 11.8 g/dL — ABNORMAL LOW (ref 12.0–15.0)
MCH: 21.9 pg — ABNORMAL LOW (ref 26.0–34.0)
MCHC: 31.2 g/dL (ref 30.0–36.0)
MCV: 70.3 fL — ABNORMAL LOW (ref 80.0–100.0)
Platelets: 192 K/uL (ref 150–400)
RBC: 5.38 MIL/uL — ABNORMAL HIGH (ref 3.87–5.11)
RDW: 16.8 % — ABNORMAL HIGH (ref 11.5–15.5)
WBC: 6.2 K/uL (ref 4.0–10.5)
nRBC: 0 % (ref 0.0–0.2)

## 2023-12-10 LAB — BASIC METABOLIC PANEL WITH GFR
Anion gap: 7 (ref 5–15)
BUN: 8 mg/dL (ref 8–23)
CO2: 27 mmol/L (ref 22–32)
Calcium: 9.8 mg/dL (ref 8.9–10.3)
Chloride: 103 mmol/L (ref 98–111)
Creatinine, Ser: 0.66 mg/dL (ref 0.44–1.00)
GFR, Estimated: >60 mL/min (ref >60)
Glucose, Bld: 152 mg/dL — ABNORMAL HIGH (ref 70–99)
Potassium: 4 mmol/L (ref 3.5–5.1)
Sodium: 137 mmol/L (ref 135–145)

## 2023-12-10 LAB — ECHOCARDIOGRAM COMPLETE
Height: 64 in
S' Lateral: 2.7 cm
Weight: 3679.04 [oz_av]

## 2023-12-10 LAB — PHOSPHORUS: Phosphorus: 2.8 mg/dL (ref 2.5–4.6)

## 2023-12-10 LAB — MAGNESIUM: Magnesium: 2.4 mg/dL (ref 1.7–2.4)

## 2023-12-10 LAB — VITAMIN D 25 HYDROXY (VIT D DEFICIENCY, FRACTURES): Vit D, 25-Hydroxy: 27.27 ng/mL — ABNORMAL LOW (ref 30–100)

## 2023-12-10 MED ORDER — BISACODYL 5 MG PO TBEC
10.0000 mg | DELAYED_RELEASE_TABLET | Freq: Every day | ORAL | 0 refills | Status: AC
  Filled 2023-12-10: qty 30, 15d supply, fill #0

## 2023-12-10 MED ORDER — CIPROFLOXACIN HCL 500 MG PO TABS
500.0000 mg | ORAL_TABLET | Freq: Two times a day (BID) | ORAL | Status: DC
  Administered 2023-12-10 – 2023-12-11 (×3): 500 mg via ORAL
  Filled 2023-12-10 (×4): qty 1

## 2023-12-10 MED ORDER — POLYETHYLENE GLYCOL 3350 17 GM/SCOOP PO POWD
17.0000 g | Freq: Two times a day (BID) | ORAL | 0 refills | Status: AC
  Filled 2023-12-10: qty 238, 7d supply, fill #0

## 2023-12-10 MED ORDER — MUPIROCIN 2 % EX OINT
TOPICAL_OINTMENT | Freq: Two times a day (BID) | CUTANEOUS | Status: DC
  Filled 2023-12-10: qty 22

## 2023-12-10 MED ORDER — BISACODYL 10 MG RE SUPP
10.0000 mg | Freq: Every day | RECTAL | 0 refills | Status: AC | PRN
  Filled 2023-12-10: qty 12, 12d supply, fill #0

## 2023-12-10 MED ORDER — MUPIROCIN 2 % EX OINT: TOPICAL_OINTMENT | Freq: Two times a day (BID) | CUTANEOUS | Status: AC

## 2023-12-10 MED ORDER — HYDRALAZINE HCL 10 MG PO TABS: 10.0000 mg | ORAL_TABLET | Freq: Three times a day (TID) | ORAL | Status: AC

## 2023-12-10 MED ORDER — DOXYCYCLINE MONOHYDRATE 100 MG PO TABS
100.0000 mg | ORAL_TABLET | Freq: Two times a day (BID) | ORAL | 0 refills | Status: AC
  Filled 2023-12-10: qty 10, 5d supply, fill #0

## 2023-12-10 MED ORDER — CIPROFLOXACIN HCL 500 MG PO TABS
500.0000 mg | ORAL_TABLET | Freq: Two times a day (BID) | ORAL | 0 refills | Status: AC
  Filled 2023-12-10: qty 10, 5d supply, fill #0

## 2023-12-10 NOTE — Discharge Summary (Signed)
 [facility] Discharge Summary   Patient: LIVIER HENDEL FMW:[id number]  PCP: Bertrum Charlie CROME, MD  Date of admission: 12/08/2023   Date of discharge:  12/10/2023     Discharge Diagnoses:  Principal Problem:   Severe sepsis [facility])   Admitted From: ALF Disposition:  ALF/ILF back to same facility  Recommendations for Outpatient Follow-up:  Follow-up with PCP, need to follow-up with an MD in 1 to 2 days, continue to monitor BP and titrate medications accordingly.  Blood pressure was soft during hospital stay so all antihypertensive medications were held, blood pressure is improving so resumed medications on discharge, monitor BP and titrate accordingly. No shortness of infection, empirically treated with antibiotics, discharging on Cipro and doxycycline  for 5 days more. Continue as needed meds for pain control. Follow up LABS/TEST:     Follow-up Information     Bertrum Charlie CROME, MD Follow up in 1 week(s).   Specialty: Family Medicine Contact information: [address] [place] [address] [phone]                Diet recommendation: Cardiac diet  Activity: The patient is advised to gradually reintroduce usual activities, as tolerated  Discharge Condition: stable  Code Status: Full code   History of present illness: As per the H and P dictated on admission.  Hospital Course:   LESHAE MCCLAY is a 68 y.o. female with medical history significant for non-insulin -dependent type 2 diabetes, GERD, hypertension, hyperlipidemia, schizophrenia and Parkinson's disease being admitted to the hospital with severe sepsis.  Patient lives in assisted living facility, this morning states that she fell out of bed, the mattress bounced her out of the bed.  She denies any recent illness, though she had a slight cough for the last 24 hours which was nonproductive.  She denies any known fevers, chills, nausea, vomiting, sick contacts.  Specifically denies any dysuria,  abdominal pain, diarrhea or any other changes in bowel or bladder habits.  On arrival to emergency department, the patient was noted to be tachycardic, febrile and lab workup shows minimally elevated leukocytosis and lactic acid.  She was referred for hospitalist admission.     Assessment and Plan:   # Severe sepsis-meeting criteria with fever, tachycardia, leukocytosis, suspected acute infection although source is not identified currently.  She does have a history of severe sepsis and ESBL UTI with bacteremia last year.  Initial lactate 2.1.  Extensive workup in the emergency department is negative for source. Blood culture NGTD, urine cultures No growth  -viral respiratory panel negative S/p empiric meropenem  given her history of ESBL infection -s/p IV fluid resuscitation, trend lactic acid 2.1>>1.1 TTE: LVEF 55 -60%, no any other significant findings. 11/15 patient remained afebrile, leukocytosis resolved, cultures negative so de-escalated antibiotics.  Discontinued meropenem . Started ciprofloxacin 500 mg p.o. twice daily for 5 days and discharged on doxycycline  100 mg p.o. twice daily for 5 days empirically.  Continue mupirocin for MRSA naris x 5 days   # Type 2 diabetes-not insulin -dependent HbA1c 7.6, well-controlled. Resumed Lantus, glimepiride  and metformin  home meds on discharge.  Continue medium carb modified diet and monitor CBG.  # Hypertension-> Hypotension  11/14 Held Toprol -XL, losartan  11/15 blood pressure still soft, continue to monitor and resume home meds when patient becomes hypertensive.  # Hyperlipidemia-Zocor    # Constipation Started MiraLAX twice daily Dulcolax 10 mg p.o. nightly 11/14 as per patient she had BM more than 7 days ago Patient agreed for Dulcolax suppository followed by enema if  no BM 11/15 moved bowels, constipation resolved   # History of Parkinson disease Currently patient not on Sinemet Advised to follow with neurology for further management as  an outpatient Resumed Primatene and gabapentin  home dose   # Schizophrenia:Continue fluphenazine 1 mg p.o. 3 times daily home dose and other home meds # Gout, continue allopurinol    # Obesity class II Body mass index is 39.47 kg/m.  Nutrition Interventions:Calorie restricted diet and daily exercise advised to lose body weight.  Lifestyle modification discussed.  Patient was ambulatory without any assistance.  On the day of the discharge the patient's vitals were stable, and no other acute medical condition were reported by patient. the patient was felt safe to be discharge at ALF/ILF.  Consultants: None Procedures: None  Discharge Exam: General: Appear in no distress, Oral Mucosa Clear, moist. Cardiovascular: S1 and S2 Present, no Murmur, Respiratory: normal respiratory effort, Bilateral Air entry present and no Crackles, no wheezes Abdomen: Bowel Sound present, Soft and no tenderness. Extremities: no Pedal edema, no calf tenderness Neurology: Alert and oriented, no focal deficits. affect appropriate.  Filed Weights   12/08/23 0647 12/08/23 0747  Weight: 104.3 kg 104.3 kg   Vitals:   12/10/23 0542 12/10/23 0734  BP: 117/74 124/67  Pulse: 83 75  Resp: 16 17  Temp: 98 F (36.7 C) 98.5 F (36.9 C)  SpO2: 94% 98%    DISCHARGE MEDICATION: Allergies as of 12/10/2023       Reactions   Benzoyl Peroxide    Cephalexin Itching   Metformin  And Related    Milk-related Compounds Diarrhea   Other Diarrhea   Milk containing products (dairy) causes diarrhea   Peroxide [hydrogen Peroxide] Swelling   Shellfish Allergy Nausea And Vomiting        Medication List     TAKE these medications    accu-chek softclix lancets Check sugar once daily, DX E11.9   Accu-Chek Softclix Lancets lancets Check sugar once daily  DX E11.9   acetaminophen  325 MG tablet Commonly known as: TYLENOL  Take 650 mg by mouth every 4 (four) hours as needed for mild pain (pain score 1-3) or  fever.   allopurinol 100 MG tablet Commonly known as: ZYLOPRIM Take 100 mg by mouth daily.   aspirin  EC 81 MG tablet Take 1 tablet by mouth daily.   Austedo XR 24 MG Tb24 Generic drug: Deutetrabenazine ER Take 24 mg by mouth every morning. Take along with one 6 mg tablet for total 30 mg once daily   Austedo XR 6 MG Tb24 Generic drug: Deutetrabenazine ER Take 6 mg by mouth every morning. Take along with one 24 mg tablet for total 30 mg once daily   bisacodyl  10 MG suppository Commonly known as: DULCOLAX Place 1 suppository (10 mg total) rectally daily as needed for severe constipation.   bisacodyl  5 MG EC tablet Commonly known as: DULCOLAX Take 2 tablets (10 mg total) by mouth at bedtime.   ciprofloxacin 500 MG tablet Commonly known as: CIPRO Take 1 tablet (500 mg total) by mouth 2 (two) times daily for 5 days.   doxycycline  100 MG tablet Commonly known as: ADOXA Take 1 tablet (100 mg total) by mouth 2 (two) times daily for 5 days.   ferrous sulfate 325 (65 FE) MG tablet Take 325 mg by mouth daily with breakfast.   fluPHENAZine 1 MG tablet Commonly known as: PROLIXIN Take 1 mg by mouth 3 (three) times daily.   folic acid 1 MG tablet Commonly known  as: FOLVITE Take 1 mg by mouth daily.   gabapentin  100 MG capsule Commonly known as: NEURONTIN  Take 100 mg by mouth at bedtime.   glucose blood test strip Commonly known as: Accu-Chek Aviva Check sugar once daily DX E11.9   hydrALAZINE 10 MG tablet Commonly known as: APRESOLINE Take 1 tablet (10 mg total) by mouth 3 (three) times daily. Hold if SBP less than 130 mmHg What changed: additional instructions   Icy Hot Original Pain Relief 2.5 % Gel Generic drug: Menthol (Topical Analgesic) Apply 1 application topically in the morning, at noon, and at bedtime. To both knees   insulin  glargine 100 UNIT/ML injection Commonly known as: LANTUS Inject 18 Units into the skin at bedtime.   losartan  50 MG tablet Commonly  known as: COZAAR  Take 1 tablet (50 mg total) by mouth daily.   magnesium  oxide 400 (240 Mg) MG tablet Commonly known as: MAG-OX Take 400 mg by mouth daily.   metoprolol  tartrate 25 MG tablet Commonly known as: LOPRESSOR  Take 12.5 mg by mouth 2 (two) times daily.   Multi-Vitamin Gummies Chew Chew 2 tablets by mouth daily at 12 noon.   mupirocin ointment 2 % Commonly known as: BACTROBAN Place into the nose 2 (two) times daily for 5 days.   nystatin powder Apply 1 Application topically 3 (three) times daily as needed (Redness).   pantoprazole  40 MG tablet Commonly known as: PROTONIX  Take 40 mg by mouth daily.   polyethylene glycol 17 g packet Commonly known as: MIRALAX / GLYCOLAX Take 17 g by mouth 2 (two) times daily.   Potassium Chloride  ER 20 MEQ Tbcr Take 20 mEq by mouth daily.   primidone 50 MG tablet Commonly known as: MYSOLINE Take 12.5 mg by mouth at bedtime.   simvastatin  10 MG tablet Commonly known as: ZOCOR  Take 1 tablet (10 mg total) by mouth daily.   torsemide 20 MG tablet Commonly known as: DEMADEX Take 30 mg by mouth 2 (two) times daily.   traMADol  50 MG tablet Commonly known as: ULTRAM  Take 1 tablet by mouth in the morning, at noon, and at bedtime.  Give 1 tablet q 12h prn breakthrough pain. Hold for lethargy   Trulicity 4.5 MG/0.5ML Soaj Generic drug: Dulaglutide Inject 4.5 mg into the skin once a week.       Allergies  Allergen Reactions   Benzoyl Peroxide    Cephalexin Itching   Metformin  And Related    Milk-Related Compounds Diarrhea   Other Diarrhea    Milk containing products (dairy) causes diarrhea   Peroxide [Hydrogen Peroxide] Swelling   Shellfish Allergy Nausea And Vomiting   Discharge Instructions     Call MD for:  difficulty breathing, headache or visual disturbances   Complete by: As directed    Call MD for:  extreme fatigue   Complete by: As directed    Call MD for:  persistant dizziness or light-headedness   Complete  by: As directed    Call MD for:  persistant nausea and vomiting   Complete by: As directed    Call MD for:  severe uncontrolled pain   Complete by: As directed    Call MD for:  temperature >100.4   Complete by: As directed    Diet - low sodium heart healthy   Complete by: As directed    Diet Carb Modified   Complete by: As directed    Discharge instructions   Complete by: As directed    Follow-up with PCP, need to  follow-up with an MD in 1 to 2 days, continue to monitor BP and titrate medications accordingly.  Blood pressure was soft during hospital stay so all antihypertensive medications were held, blood pressure is improving so resumed medications on discharge, monitor BP and titrate accordingly. No shortness of infection, empirically treated with antibiotics, discharging on Cipro and doxycycline  for 5 days more. Continue as needed meds for pain control.   Increase activity slowly   Complete by: As directed        The results of significant diagnostics from this hospitalization (including imaging, microbiology, ancillary and laboratory) are listed below for reference.    Significant Diagnostic Studies: ECHOCARDIOGRAM COMPLETE Result Date: 12/10/2023    ECHOCARDIOGRAM REPORT   Patient Name:   YANA SCHORR Date of Exam: 12/09/2023 Medical Rec #:  [id number]     Height:       64.0 in Accession #:    [id number]    Weight:       229.9 lb Date of Birth:  30-Aug-1955     BSA:          2.076 m Patient Age:    68 years      BP:           98/71 mmHg Patient Gender: F             HR:           80 bpm. Exam Location:  [facility] Procedure: 2D Echo, Cardiac Doppler and Color Doppler (Both Spectral and Color            Flow Doppler were utilized during procedure). Indications:     Endocarditis I38  History:         Patient has prior history of Echocardiogram examinations, most                  recent 11/23/2017. Risk Factors:Diabetes, Hypertension and                  Sleep Apnea.  Sonographer:     Christopher Furnace Referring Phys:  [id number] ELVAN SOR Diagnosing Phys: Cara JONETTA Lovelace MD  Sonographer Comments: Technically challenging study due to limited acoustic windows, no apical window and no subcostal window. IMPRESSIONS  1. Inferior posterior hypo.  2. Left ventricular ejection fraction, by estimation, is 55 to 60%. The left ventricle has normal function. The left ventricle demonstrates regional wall motion abnormalities (see scoring diagram/findings for description). Left ventricular diastolic function could not be evaluated.  3. Right ventricular systolic function is normal. The right ventricular size is normal.  4. The mitral valve is abnormal. Trivial mitral valve regurgitation.  5. The aortic valve is normal in structure. Aortic valve regurgitation is not visualized. Conclusion(s)/Recommendation(s): Poor windows for evaluation of left ventricular function by transthoracic echocardiography. Would recommend an alternative means of evaluation. FINDINGS  Left Ventricle: Left ventricular ejection fraction, by estimation, is 55 to 60%. The left ventricle has normal function. The left ventricle demonstrates regional wall motion abnormalities. Strain was performed and the global longitudinal strain is indeterminate. The left ventricular internal cavity size was normal in size. There is borderline left ventricular hypertrophy. Left ventricular diastolic function could not be evaluated. Right Ventricle: The right ventricular size is normal. No increase in right ventricular wall thickness. Right ventricular systolic function is normal. Left Atrium: Left atrial size was normal in size. Right Atrium: Right atrial size was normal in size. Pericardium: There is no evidence of pericardial effusion. Mitral Valve: The  mitral valve is abnormal. There is moderate calcification of the mitral valve leaflet(s). Trivial mitral valve regurgitation. Tricuspid Valve: The tricuspid valve is normal in structure. Tricuspid valve  regurgitation is trivial. Aortic Valve: The aortic valve is normal in structure. Aortic valve regurgitation is not visualized. Pulmonic Valve: The pulmonic valve was normal in structure. Pulmonic valve regurgitation is not visualized. Aorta: The ascending aorta was not well visualized. IAS/Shunts: No atrial level shunt detected by color flow Doppler. Additional Comments: Inferior posterior hypo. 3D was performed not requiring image post processing on an independent workstation and was indeterminate.  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 Electronically Signed   By: Toribio Agreste M.D.   On: 12/08/2023 09:17   Result Date: 12/08/2023 CLINICAL DATA:  Fall this morning out of bed.  Injury to mid back. EXAM: CT THORACIC SPINE WITHOUT CONTRAST TECHNIQUE: Multidetector CT images of the thoracic were obtained using the standard protocol without intravenous contrast. RADIATION DOSE REDUCTION: This exam was performed according to the departmental dose-optimization program which includes automated exposure control, adjustment of the mA and/or kV according to patient size and/or use of iterative reconstruction technique. COMPARISON:  None Available. FINDINGS: Alignment: Normal. Vertebrae: Mild spondylosis throughout the thoracic spine. Vertebral body heights are maintained. No evidence of acute compression fracture. No significant neural foraminal narrowing. No  evidence of spinal canal stenosis. Paraspinal and other soft tissues: Normal. Disc levels: Mild disc space narrowing at multiple levels of the mid to lower thoracic spine. IMPRESSION: 1. No acute findings. 2. Mild spondylosis throughout the thoracic spine. Electronically Signed: By: Toribio Agreste M.D. On: 12/08/2023 08:53   CT Chest Wo Contrast Result Date: 12/08/2023 CLINICAL DATA:  Fall this morning from bed. Blunt trauma to right back. EXAM: CT CHEST WITHOUT CONTRAST, CT ABDOMEN AND PELVIS WITH CONTRAST TECHNIQUE: Multidetector CT imaging of the chest was performed without contrast as CT abdomen and pelvis was performed following the standard protocol during bolus administration of intravenous contrast. RADIATION DOSE REDUCTION: This exam was performed according to the departmental dose-optimization program which includes automated exposure control, adjustment of the mA and/or kV according to patient size and/or use of iterative reconstruction technique. CONTRAST:  80mL OMNIPAQUE  IOHEXOL  350 MG/ML SOLN COMPARISON:  CT chest 12/16/2022 and CT abdomen/pelvis 05/29/2022, chest CT 04/28/2015 FINDINGS: CT CHEST FINDINGS Cardiovascular: Heart is normal size. Thoracic aorta is normal in caliber. Pulmonary arterial system is unremarkable in this noncontrast exam. Remaining vascular structures are normal. Mediastinum/Nodes: No evidence of mediastinal hemorrhage. No adenopathy over the mediastinum or hilar regions. Few small calcified lymph nodes over the right hilum and subcarinal region. Remaining mediastinal structures are unremarkable. Lungs/Pleura: Lungs are adequately  inflated without acute airspace process or effusion. No pneumothorax. 5-6 mm nodule over the left lower lobe unchanged from 2017 and therefore considered benign. Small airways are normal. 5 mm density along the right lateral wall of the lower trachea without significant change from 12/16/2022. Musculoskeletal: No acute fracture. Mild sclerosis of  the the T10 vertebral body unchanged from 2018 likely due to hemangioma. CT ABDOMEN PELVIS FINDINGS Hepatobiliary: Previous cholecystectomy. Liver and biliary tree are normal. Pancreas: Normal. Spleen: Normal. Adrenals/Urinary Tract: Adrenal glands are normal. Kidneys are normal in size without hydronephrosis or nephrolithiasis. Ureters and bladder are normal. Stomach/Bowel: Stomach and small bowel are normal. Appendix is normal. Mild fecal retention throughout the colon which is otherwise unremarkable. Vascular/Lymphatic: Abdominal aorta is normal in caliber. Remaining vascular structures are unremarkable. No adenopathy. Reproductive: Status post hysterectomy. No adnexal masses. Other: No free fluid or free peritoneal air. Small umbilical hernia containing only peritoneal fat unchanged. Musculoskeletal: No acute fracture. IMPRESSION: 1. No acute findings in the chest, abdomen or pelvis. 2. 5 mm density along the right lateral wall of the lower trachea without significant change from 12/16/2022. This may represent a small polyp. Recommend follow-up on a clinical basis. 3. 5-6 mm nodule over the left lower lobe unchanged from 2017 and therefore considered benign. 4. Small umbilical hernia containing only peritoneal fat unchanged. 5. 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 Electronically Signed   By: Toribio Agreste M.D.   On: 12/08/2023 09:16   CT ABDOMEN PELVIS W CONTRAST Result Date: 12/08/2023 CLINICAL DATA:  Fall this morning from bed. Blunt trauma to right back. EXAM: CT CHEST WITHOUT CONTRAST, CT ABDOMEN AND PELVIS WITH CONTRAST TECHNIQUE: Multidetector CT imaging of the chest was performed without contrast as CT abdomen and pelvis was performed following the standard protocol during bolus administration of intravenous contrast. RADIATION DOSE REDUCTION: This exam was performed according to the departmental dose-optimization program which includes automated exposure control, adjustment  of the mA and/or kV according to patient size and/or use of iterative reconstruction technique. CONTRAST:  80mL OMNIPAQUE  IOHEXOL  350 MG/ML SOLN COMPARISON:  CT chest 12/16/2022 and CT abdomen/pelvis 05/29/2022, chest CT 04/28/2015 FINDINGS: CT CHEST FINDINGS Cardiovascular: Heart is normal size. Thoracic aorta is normal in caliber. Pulmonary arterial system is unremarkable in this noncontrast exam. Remaining vascular structures are normal. Mediastinum/Nodes: No evidence of mediastinal hemorrhage. No adenopathy over the mediastinum or hilar regions. Few small calcified lymph nodes over the right hilum and subcarinal region. Remaining mediastinal structures are unremarkable. Lungs/Pleura: Lungs are adequately inflated without acute airspace process or effusion. No pneumothorax. 5-6 mm nodule over the left lower lobe unchanged from 2017 and therefore considered benign. Small airways are normal. 5 mm density along the right lateral wall of the lower trachea without significant change from 12/16/2022. Musculoskeletal: No acute fracture. Mild sclerosis of the the T10 vertebral body unchanged from 2018 likely due to hemangioma. CT ABDOMEN PELVIS FINDINGS Hepatobiliary: Previous cholecystectomy. Liver and biliary tree are normal. Pancreas: Normal. Spleen: Normal. Adrenals/Urinary Tract: Adrenal glands are normal. Kidneys are normal in size without hydronephrosis or nephrolithiasis. Ureters and bladder are normal. Stomach/Bowel: Stomach and small bowel are normal. Appendix is normal. Mild fecal retention throughout the colon which is otherwise unremarkable. Vascular/Lymphatic: Abdominal aorta is normal in caliber. Remaining vascular structures are unremarkable. No adenopathy. Reproductive: Status post hysterectomy. No adnexal masses. Other: No free fluid or free peritoneal air. Small umbilical hernia containing only peritoneal fat unchanged. Musculoskeletal: No acute fracture. IMPRESSION: 1. No  acute findings in the chest,  abdomen or pelvis. 2. 5 mm density along the right lateral wall of the lower trachea without significant change from 12/16/2022. This may represent a small polyp. Recommend follow-up on a clinical basis. 3. 5-6 mm nodule over the left lower lobe unchanged from 2017 and therefore considered benign. 4. Small umbilical hernia containing only peritoneal fat unchanged. 5. 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 Electronically Signed   By: Toribio Agreste M.D.   On: 12/08/2023 09:16   CT Head Wo Contrast Result Date: 12/08/2023 CLINICAL DATA:  Fall this morning from bed. EXAM: CT HEAD WITHOUT CONTRAST CT CERVICAL SPINE WITHOUT CONTRAST TECHNIQUE: Multidetector CT imaging of the head and cervical spine was performed following the standard protocol without intravenous contrast. Multiplanar CT image reconstructions of the cervical spine were also generated. RADIATION DOSE REDUCTION: This exam was performed according to the departmental dose-optimization program which includes automated exposure control, adjustment of the mA and/or kV according to patient size and/or use of iterative reconstruction technique. COMPARISON:  10/27/2020 FINDINGS: CT HEAD FINDINGS Brain: Ventricles, cisterns and other CSF spaces are normal. There is no mass, mass effect, shift of midline structures or acute hemorrhage. Mild chronic ischemic microvascular disease. Vascular: No hyperdense vessel or unexpected calcification. Skull: No acute fracture. Sinuses/Orbits: Orbits are normal symmetric. Paranasal sinuses are well developed and well aerated without air-fluid level. Mastoid air cells are clear. Other: None. CT CERVICAL SPINE FINDINGS Alignment: No posttraumatic subluxation. Skull base and vertebrae: Moderate spondylosis throughout the cervical spine to include uncovertebral joint spurring and facet arthropathy. Vertebral body heights are maintained. Non fusion of the midline posterior arch of C1. No evidence of acute  fracture. Mild left-sided neural foraminal narrowing at the C3-4 and C4-5 levels. Bilateral moderate neural foraminal narrowing at the C5-6 level right worse than left. Mild left-sided neural foraminal narrowing at the C6-7 level. Soft tissues and spinal canal: Prevertebral soft tissues are normal. There is mild narrowing of the spinal canal in the AP dimension from the C3-4 level to the C5-6 level predominantly due to posterior spurring. Disc levels: Moderate disc space narrowing at the C3-4, C4-5 and C5-6 levels and also C6-7 level. Upper chest: No acute findings. Other: None. IMPRESSION: 1. No acute brain injury. 2. Mild chronic ischemic microvascular disease. 3. No acute cervical spine injury. 4. 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 Electronically Signed   By: Toribio Agreste M.D.   On: 12/08/2023 08:47   CT Cervical Spine Wo Contrast Result Date: 12/08/2023 CLINICAL DATA:  Fall this morning from bed. EXAM: CT HEAD WITHOUT CONTRAST CT CERVICAL SPINE WITHOUT CONTRAST TECHNIQUE: Multidetector CT imaging of the head and cervical spine was performed following the standard protocol without intravenous contrast. Multiplanar CT image reconstructions of the cervical spine were also generated. RADIATION DOSE REDUCTION: This exam was performed according to the departmental dose-optimization program which includes automated exposure control, adjustment of the mA and/or kV according to patient size and/or use of iterative reconstruction technique. COMPARISON:  10/27/2020 FINDINGS: CT HEAD FINDINGS Brain: Ventricles, cisterns and other CSF spaces are normal. There is no mass, mass effect, shift of midline structures or acute hemorrhage. Mild chronic ischemic microvascular disease. Vascular: No hyperdense vessel or unexpected calcification. Skull: No acute fracture. Sinuses/Orbits: Orbits are normal symmetric.  Paranasal sinuses are well developed and well aerated without air-fluid level. Mastoid air cells are clear. Other: None. CT CERVICAL  SPINE FINDINGS Alignment: No posttraumatic subluxation. Skull base and vertebrae: Moderate spondylosis throughout the cervical spine to include uncovertebral joint spurring and facet arthropathy. Vertebral body heights are maintained. Non fusion of the midline posterior arch of C1. No evidence of acute fracture. Mild left-sided neural foraminal narrowing at the C3-4 and C4-5 levels. Bilateral moderate neural foraminal narrowing at the C5-6 level right worse than left. Mild left-sided neural foraminal narrowing at the C6-7 level. Soft tissues and spinal canal: Prevertebral soft tissues are normal. There is mild narrowing of the spinal canal in the AP dimension from the C3-4 level to the C5-6 level predominantly due to posterior spurring. Disc levels: Moderate disc space narrowing at the C3-4, C4-5 and C5-6 levels and also C6-7 level. Upper chest: No acute findings. Other: None. IMPRESSION: 1. No acute brain injury. 2. Mild chronic ischemic microvascular disease. 3. No acute cervical spine injury. 4. 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 Electronically Signed   By: Toribio Agreste M.D.   On: 12/08/2023 08:47    Microbiology: Recent Results (from the past 240 hours)  Culture, blood (Routine x 2)     Status: None (Preliminary result)   Collection Time: 12/08/23  6:59 AM   Specimen: BLOOD  Result Value Ref Range Status   Specimen Description BLOOD LEFT ANTECUBITAL  Final   Special Requests   Final    BOTTLES DRAWN AEROBIC AND ANAEROBIC Blood Culture adequate volume   Culture   Final    NO GROWTH 2 DAYS Performed at [facility], [address], [place], [place] [address]    Report Status PENDING  Incomplete  Culture, blood (Routine x 2)     Status: None  (Preliminary result)   Collection Time: 12/08/23  7:27 AM   Specimen: BLOOD  Result Value Ref Range Status   Specimen Description BLOOD BLOOD LEFT HAND  Final   Special Requests   Final    BOTTLES DRAWN AEROBIC AND ANAEROBIC Blood Culture results may not be optimal due to an inadequate volume of blood received in culture bottles   Culture   Final    NO GROWTH 2 DAYS Performed at [facility], [address] [place], [place] [address]    Report Status PENDING  Incomplete  Resp panel by RT-PCR (RSV, Flu A&B, Covid) Anterior Nasal Swab     Status: None   Collection Time: 12/08/23  7:27 AM   Specimen: Anterior Nasal Swab  Result Value Ref Range Status   SARS Coronavirus 2 by RT PCR NEGATIVE NEGATIVE Final    Comment: (NOTE) SARS-CoV-2 target nucleic acids are NOT DETECTED.  The SARS-CoV-2 RNA is generally detectable in upper respiratory specimens during the acute phase of infection. The lowest concentration of SARS-CoV-2 viral copies this assay can detect is 138 copies/mL. A negative result does not preclude SARS-Cov-2 infection and should not be used as the sole basis for treatment or other patient management decisions. A negative result may occur with  improper specimen collection/handling, submission of specimen other than nasopharyngeal swab, presence of viral mutation(s) within the areas targeted by this assay, and inadequate number of viral copies(<138 copies/mL). A negative result must be combined with clinical observations, patient history, and epidemiological information. The expected result is Negative.  Fact Sheet for Patients:  bloggercourse.com  Fact Sheet for Healthcare Providers:  seriousbroker.it  This test is no t yet approved or cleared by the [place]  FDA and  has been authorized for detection and/or diagnosis of SARS-CoV-2 by FDA under an Emergency Use Authorization (EUA). This EUA will remain   in effect (meaning this test can be used) for the duration of the COVID-19 declaration under Section 564(b)(1) of the Act, 21 U.S.C.section 360bbb-3(b)(1), unless the authorization is terminated  or revoked sooner.       Influenza A by PCR NEGATIVE NEGATIVE Final   Influenza B by PCR NEGATIVE NEGATIVE Final    Comment: (NOTE) The Xpert Xpress SARS-CoV-2/FLU/RSV plus assay is intended as an aid in the diagnosis of influenza from Nasopharyngeal swab specimens and should not be used as a sole basis for treatment. Nasal washings and aspirates are unacceptable for Xpert Xpress SARS-CoV-2/FLU/RSV testing.  Fact Sheet for Patients: bloggercourse.com  Fact Sheet for Healthcare Providers: seriousbroker.it  This test is not yet approved or cleared by the [place]  FDA and has been authorized for detection and/or diagnosis of SARS-CoV-2 by FDA under an Emergency Use Authorization (EUA). This EUA will remain in effect (meaning this test can be used) for the duration of the COVID-19 declaration under Section 564(b)(1) of the Act, 21 U.S.C. section 360bbb-3(b)(1), unless the authorization is terminated or revoked.     Resp Syncytial Virus by PCR NEGATIVE NEGATIVE Final    Comment: (NOTE) Fact Sheet for Patients: bloggercourse.com  Fact Sheet for Healthcare Providers: seriousbroker.it  This test is not yet approved or cleared by the [place]  FDA and has been authorized for detection and/or diagnosis of SARS-CoV-2 by FDA under an Emergency Use Authorization (EUA). This EUA will remain in effect (meaning this test can be used) for the duration of the COVID-19 declaration under Section 564(b)(1) of the Act, 21 U.S.C. section 360bbb-3(b)(1), unless the authorization is terminated or revoked.  Performed at [facility], [address] [place], [place] [address]    Respiratory (~20 pathogens) panel by PCR     Status: None   Collection Time: 12/08/23 11:44 AM   Specimen: Nasopharyngeal Swab; Respiratory  Result Value Ref Range Status   Adenovirus NOT DETECTED NOT DETECTED Final   Coronavirus 229E NOT DETECTED NOT DETECTED Final    Comment: (NOTE) The Coronavirus on the Respiratory Panel, DOES NOT test for the novel  Coronavirus (2019 nCoV)    Coronavirus HKU1 NOT DETECTED NOT DETECTED Final   Coronavirus NL63 NOT DETECTED NOT DETECTED Final   Coronavirus OC43 NOT DETECTED NOT DETECTED Final   Metapneumovirus NOT DETECTED NOT DETECTED Final   Rhinovirus / Enterovirus NOT DETECTED NOT DETECTED Final   Influenza A NOT DETECTED NOT DETECTED Final   Influenza B NOT DETECTED NOT DETECTED Final   Parainfluenza Virus 1 NOT DETECTED NOT DETECTED Final   Parainfluenza Virus 2 NOT DETECTED NOT DETECTED Final   Parainfluenza Virus 3 NOT DETECTED NOT DETECTED Final   Parainfluenza Virus 4 NOT DETECTED NOT DETECTED Final   Respiratory Syncytial Virus NOT DETECTED NOT DETECTED Final   Bordetella pertussis NOT DETECTED NOT DETECTED Final   Bordetella Parapertussis NOT DETECTED NOT DETECTED Final   Chlamydophila pneumoniae NOT DETECTED NOT DETECTED Final   Mycoplasma pneumoniae NOT DETECTED NOT DETECTED Final    Comment: Performed at [facility], [address] [place], [place] [address]  Urine Culture     Status: None   Collection Time: 12/08/23 11:44 AM   Specimen: Urine, Clean Catch  Result Value Ref Range Status   Specimen Description   Final    URINE, CLEAN CATCH Performed at [facility], [address], [place], [place] [address]    Special Requests   Final    NONE Performed at [facility], [address] [place], [place] [address]    Culture   Final    NO GROWTH Performed at [facility], [address], [place], [place] [address]    Report Status 12/09/2023 FINAL  Final  MRSA Next Gen by PCR, Nasal      Status: Abnormal   Collection Time: 12/08/23  2:00 PM   Specimen: Nasal Mucosa; Nasal Swab  Result Value Ref Range Status   MRSA by PCR Next Gen DETECTED (A) NOT DETECTED Final    Comment: RESULT CALLED TO, READ BACK BY AND VERIFIED WITH: AIDA BESONG @1750  ON 12/08/23 SKL (NOTE) The GeneXpert MRSA Assay (FDA approved for NASAL specimens only), is one component of a comprehensive MRSA colonization surveillance program. It is not intended to diagnose MRSA infection nor to guide or monitor treatment for MRSA infections. Test performance is not FDA approved in patients less than 57 years old. Performed at [facility], [address] [place], [place] [address]      Labs: CBC: Recent Labs  Lab 12/08/23 [phone] 12/09/23 0419 12/10/23 0442  WBC 10.9* 5.4 6.2  NEUTROABS 9.1*  --   --   HGB 12.2 11.7* 11.8*  HCT 39.2 37.9 37.8  MCV 70.5* 70.2* 70.3*  PLT 191 180 192   Basic Metabolic Panel: Recent Labs  Lab 12/08/23 0659 12/09/23 0419 12/10/23 0442  NA 139 140 137  K 4.1 3.7 4.0  CL 100 101 103  CO2 29 28 27   GLUCOSE 143* 130* 152*  BUN 10 8 8   CREATININE 0.98 0.81 0.66  CALCIUM 9.5 9.2 9.8  MG  --  2.4 2.4  PHOS  --  3.8 2.8   Liver Function Tests: Recent Labs  Lab 12/08/23 0659  AST 18  ALT 10  ALKPHOS 99  BILITOT 0.5  PROT 6.5  ALBUMIN 4.1   No results for input(s): LIPASE, AMYLASE in the last 168 hours. No results for input(s): AMMONIA in the last 168 hours. Cardiac Enzymes: No results for input(s): CKTOTAL, CKMB, CKMBINDEX, TROPONINI in the last 168 hours. BNP (last 3 results) No results for input(s): BNP in the last 8760 hours. CBG: Recent Labs  Lab 12/09/23 1156 12/09/23 1642 12/09/23 2140 12/10/23 0731 12/10/23 1137  GLUCAP 166* 196* 134* 133* 151*    Time spent: 35 minutes  Signed:  Elvan Sor  [facility] 12/10/2023 1:38 PM

## 2023-12-10 NOTE — Evaluation (Signed)
 Physical Therapy Evaluation Patient Details Name: Christy [name] MRN: [id number] DOB: [birth year] Today's Date: 12/10/2023  History of Present Illness  Christy [name] is a 68 y.o. female history of diabetes, hypertension hyperlipidemia schizophrenia asthma  Clinical Impression  Patient noted to be in supine position at PT arrival in room, for an initial PT evaluation due to a decline in functional status, with baseline mobility reported as modI with history of falls, and currently requiring CGA for bed mobility and minA for transfers; progressive ambulation will likely require x2 for safety due to impaired cognition at baseline and body mass with high rate of LOB. The patient is A&O x 3 not entirely aware of situation, presenting with good willingness to work with PT and goals of going home, with discharge expectations that include HHPT. The patient resides in a assisted living facility. Gait was deferred due to pt. LE weakness. Activity restrictions include impaired prolonged standing and activities that requires prolonged walking, and the overall clinical impression is that the patient presents with mild to moderate mobility limitations secondary to recurrent falls. Recommended skilled PT will address safety, mobility, and discharge planning. PT recommendation to d/c patient to HHPT at assisted living facility upon medical clearance.        If plan is discharge home, recommend the following: A little help with walking and/or transfers;A little help with bathing/dressing/bathroom;Assist for transportation;Help with stairs or ramp for entrance   Can travel by private vehicle        Equipment Recommendations Other (comment) (TBD\)  Recommendations for Other Services       Functional Status Assessment Patient has had a recent decline in their functional status and/or demonstrates limited ability to make significant improvements in function in a reasonable and predictable amount of time      Precautions / Restrictions Precautions Precautions: Fall Restrictions Weight Bearing Restrictions Per Provider Order: No      Mobility  Bed Mobility Overal bed mobility: Needs Assistance Bed Mobility: Supine to Sit     Supine to sit: Contact guard     General bed mobility comments: vc for movement sequence    Transfers Overall transfer level: Needs assistance Equipment used: Rolling walker (2 wheels) Transfers: Bed to chair/wheelchair/BSC   Stand pivot transfers: Min assist, Contact guard assist         General transfer comment: first attempt patient states that knee gave out and after abruptly sat back on BSC uncontrolled    Ambulation/Gait               General Gait Details: deferred due to lower extremity weakness  Stairs            Wheelchair Mobility     Tilt Bed    Modified Rankin (Stroke Patients Only)       Balance Overall balance assessment: Needs assistance Sitting-balance support: Feet supported Sitting balance-Leahy Scale: Good     Standing balance support: During functional activity, Reliant on assistive device for balance Standing balance-Leahy Scale: Poor                               Pertinent Vitals/Pain Pain Assessment Pain Assessment: 0-10 Pain Score: 3  Pain Location: low back Pain Descriptors / Indicators: Sore Pain Intervention(s): Limited activity within patient's tolerance, Monitored during session, Repositioned    Home Living Family/patient expects to be discharged to:: Assisted living  Home Equipment: Rollator (4 wheels)      Prior Function Prior Level of Function : Independent/Modified Independent                     Extremity/Trunk Assessment        Lower Extremity Assessment Lower Extremity Assessment: Generalized weakness    Cervical / Trunk Assessment Cervical / Trunk Assessment: Normal  Communication   Communication Communication: No  apparent difficulties    Cognition Arousal: Alert Behavior During Therapy: WFL for tasks assessed/performed, Flat affect   PT - Cognitive impairments: History of cognitive impairments                         Following commands: Impaired Following commands impaired: Follows multi-step commands inconsistently     Cueing Cueing Techniques: Verbal cues, Tactile cues     General Comments      Exercises     Assessment/Plan    PT Assessment Patient needs continued PT services  PT Problem List Decreased strength;Decreased activity tolerance;Decreased balance;Decreased mobility;Decreased safety awareness;Obesity       PT Treatment Interventions DME instruction;Gait training;Functional mobility training;Therapeutic activities;Therapeutic exercise;Balance training;Neuromuscular re-education;Patient/family education    PT Goals (Current goals can be found in the Care Plan section)  Acute Rehab PT Goals Patient Stated Goal: Pt wants to go back home PT Goal Formulation: With patient Time For Goal Achievement: 12/24/23 Potential to Achieve Goals: Good    Frequency Min 2X/week     Co-evaluation               AM-PAC PT 6 Clicks Mobility  Outcome Measure Help needed turning from your back to your side while in a flat bed without using bedrails?: None Help needed moving from lying on your back to sitting on the side of a flat bed without using bedrails?: None Help needed moving to and from a bed to a chair (including a wheelchair)?: A Little Help needed standing up from a chair using your arms (e.g., wheelchair or bedside chair)?: A Little Help needed to walk in hospital room?: A Lot Help needed climbing 3-5 steps with a railing? : Total 6 Click Score: 17    End of Session Equipment Utilized During Treatment: Gait belt Activity Tolerance: Patient tolerated treatment well;Patient limited by fatigue Patient left: in chair;with call bell/phone within reach;with  chair alarm set;with nursing/sitter in room Nurse Communication: Mobility status PT Visit Diagnosis: Unsteadiness on feet (R26.81);Other abnormalities of gait and mobility (R26.89);Repeated falls (R29.6);Muscle weakness (generalized) (M62.81);History of falling (Z91.81);Difficulty in walking, not elsewhere classified (R26.2)    Time: 8994-8970 PT Time Calculation (min) (ACUTE ONLY): 24 min   Charges:   PT Evaluation $PT Eval Low Complexity: 1 Low   PT General Charges $$ ACUTE PT VISIT: 1 Visit         Sherlean Lesches DPT, PT    Rastus Borton A Jenni Thew 12/10/2023, 10:40 AM

## 2023-12-10 NOTE — TOC Progression Note (Addendum)
 Transition of Care [facility]) - Progression Note    Patient Details  Name: Christy [name] MRN: [id number] Date of Birth: January 10, 1956  Transition of Care [facility] CM/SW Contact  Alvia Olam Fabry, RN Phone Number: 12/10/2023, 2:01 PM  Clinical Narrative:    RNCM informed pt ready for discharge. RNCM attempted to contact representative with [facility] (Tammy [phone]) however only able to leave a HIPAA voice message requesting a call back. RNCM also reached out to the relative noted in [facility] [phone] unsuccessful.  Dr. Von provided another contact but RNCM will need permission from the facility for the return of pt. RNCM did not contact this relative at this time awaiting a response from Tammy at this facility. [name],[name] (Relative) [phone]   TOC will continue to follow up accordingly with call backs.                    Expected Discharge Plan and Services         Expected Discharge Date: 12/10/23                                     Social Drivers of Health (SDOH) Interventions SDOH Screenings   Food Insecurity: No Food Insecurity (12/08/2023)  Housing: Low Risk  (12/08/2023)  Transportation Needs: No Transportation Needs (12/08/2023)  Utilities: Not At Risk (12/08/2023)  Social Connections: Socially Isolated (12/08/2023)  Tobacco Use: High Risk (12/08/2023)    Readmission Risk Interventions     No data to display

## 2023-12-10 NOTE — Evaluation (Signed)
 Occupational Therapy Evaluation Patient Details Name: Christy [name] MRN: [id number] DOB: [birth year] Today's Date: 12/10/2023   History of Present Illness   Christy [name] is a 68 y.o. female who was admitted with severe sepsis. Pt. PMHx includes:Parkinson's Disease, diabetes, hypertension, hyperlipidemia, schizophrenia, and  asthma     Clinical Impressions Pt. presents with bilateral hand tremors, weakness, limited activity tolerance, and limited functional mobility which limits the ability to complete basic ADL and IADL functioning. Pt. resides at [facility]. Pt. required assist with morning ADL care, and IADL tasks from staff. Pt. has meals in a common dining area, and uses a rollator walker within the facility. Pt. reports no pain, however has soreness discomfort at her lateral right side, and back. Pt. Requires MinA UE ADLs, and min/modA LE ADLs.  Pt. education was provided about proximal stabilization through her forearms for more distal motor control 2/2 tremors. Pt. will benefit from OT services for ADL training, A/E training, UE there. Ex., neuromuscular re-education, and Pt./caregiver education compensatory strategies for bilateral hand tremors..    If plan is discharge home, recommend the following:   A lot of help with bathing/dressing/bathroom;A little help with walking and/or transfers;Assistance with cooking/housework;Direct supervision/assist for medications management     Functional Status Assessment   Patient has had a recent decline in their functional status and demonstrates the ability to make significant improvements in function in a reasonable and predictable amount of time.     Equipment Recommendations         Recommendations for Other Services         Precautions/Restrictions   Precautions Precautions: Fall Restrictions Weight Bearing Restrictions Per Provider Order: No     Mobility Bed Mobility Overal bed mobility: Needs Assistance              General bed mobility comments: Pt. was up in the recliner chair upon arrival    Transfers Overall transfer level: Needs assistance Equipment used: Rolling walker (2 wheels) Transfers: Bed to chair/wheelchair/BSC   Stand pivot transfers: Min assist, Contact guard assist                Balance                                           ADL either performed or assessed with clinical judgement   ADL Overall ADL's : Needs assistance/impaired Eating/Feeding: Independent   Grooming: Set up;Supervision/safety           Upper Body Dressing : Set up;Minimal assistance   Lower Body Dressing: Set up;Minimal assistance;Moderate assistance                       Vision Baseline Vision/History: 1 Wears glasses Patient Visual Report: No change from baseline       Perception         Praxis         Pertinent Vitals/Pain Pain Assessment Pain Location: discomfort/soreness in the lateral right side, and trunk. Not rated.     Extremity/Trunk Assessment Upper Extremity Assessment Upper Extremity Assessment: Overall WFL for tasks assessed           Communication Communication Communication: No apparent difficulties   Cognition Arousal: Alert Behavior During Therapy: WFL for tasks assessed/performed, Flat affect  Following commands: Impaired Following commands impaired: Follows multi-step commands inconsistently     Cueing  General Comments   Cueing Techniques: Verbal cues;Tactile cues      Exercises     Shoulder Instructions      Home Living Family/patient expects to be discharged to:: Assisted living                                        Prior Functioning/Environment Prior Level of Function : Independent/Modified Independent             Mobility Comments: Ambulates with a rollator ADLs Comments: Staff assists with bathing, showers, and  dressing. Pt. eats meals in a common dining area.    OT Problem List:     OT Treatment/Interventions: Self-care/ADL training;Therapeutic exercise;DME and/or AE instruction;Energy conservation;Patient/family education;Neuromuscular education;Therapeutic activities      OT Goals(Current goals can be found in the care plan section)   Acute Rehab OT Goals Patient Stated Goal: To return to [facility] OT Goal Formulation: With patient Time For Goal Achievement: 12/24/23 Potential to Achieve Goals: Good   OT Frequency:  Min 2X/week    Co-evaluation              AM-PAC OT 6 Clicks Daily Activity     Outcome Measure Help from another person eating meals?: None Help from another person taking care of personal grooming?: A Little Help from another person toileting, which includes using toliet, bedpan, or urinal?: A Little Help from another person bathing (including washing, rinsing, drying)?: A Lot Help from another person to put on and taking off regular upper body clothing?: A Little Help from another person to put on and taking off regular lower body clothing?: A Lot 6 Click Score: 17   End of Session Equipment Utilized During Treatment: Gait belt  Activity Tolerance: Patient tolerated treatment well Patient left:    OT Visit Diagnosis: Muscle weakness (generalized) (M62.81);History of falling (Z91.81)                Time: 8599-8576 OT Time Calculation (min): 23 min Charges:  OT General Charges $OT Visit: 1 Visit OT Evaluation $OT Eval Moderate Complexity: 1 Mod Richardson Otter, MS, OTR/L Christy [name] 12/10/2023, 2:36 PM

## 2023-12-10 NOTE — Progress Notes (Signed)
 [facility] Progress Note  Patient: Christy [name]    FMW:[id number]  DOA: 12/08/2023     Date of Service: the patient was seen and examined on 12/10/2023  Chief Complaint  Patient presents with   Fall   Brief hospital course: Christy [name] is a 68 y.o. female with medical history significant for non-insulin -dependent type 2 diabetes, GERD, hypertension, hyperlipidemia, schizophrenia and Parkinson's disease being admitted to the hospital with severe sepsis.  Patient lives in assisted living facility, this morning states that she fell out of bed, the mattress bounced her out of the bed.  She denies any recent illness, though she had a slight cough for the last 24 hours which was nonproductive.  She denies any known fevers, chills, nausea, vomiting, sick contacts.  Specifically denies any dysuria, abdominal pain, diarrhea or any other changes in bowel or bladder habits.  On arrival to emergency department, the patient was noted to be tachycardic, febrile and lab workup shows minimally elevated leukocytosis and lactic acid.  She was referred for hospitalist admission.    Assessment and Plan:  # Severe sepsis-meeting criteria with fever, tachycardia, leukocytosis, suspected acute infection although source is not identified currently.  She does have a history of severe sepsis and ESBL UTI with bacteremia last year.  Initial lactate 2.1.  Extensive workup in the emergency department is negative for source. -Blood culture NGTD  - urine cultures No growth  -viral respiratory panel negative - Continue empiric meropenem  given her history of ESBL infection -s/p IV fluid resuscitation, trend lactic acid 2.1>>1.1 TTE: LVEF 55 -60%, no any other significant findings. 11/15 patient remained afebrile, leukocytosis resolved, cultures negative so de-escalated antibiotics.  Discontinued meropenem . Started ciprofloxacin 500 mg p.o. twice daily for 5 days and discharged on doxycycline  100 mg p.o. twice  daily for 5 days empirically.  Continue mupirocin for MRSA naris x 5 days  # Type 2 diabetes-not insulin -dependent HbA1c 7.6, well-controlled -Hold home glimepiride  and metformin  -Carb modified diet, with moderate dose sliding scale Semglee 18 units nightly   # Hypertension-> Hypotension  11/14 Held Toprol -XL, losartan  Monitor BP and titrate medications accordingly  # Hyperlipidemia-Zocor   # Constipation Started MiraLAX twice daily Dulcolax 10 mg p.o. nightly 11/14 as per patient she had BM more than 7 days ago Patient agreed for Dulcolax suppository followed by enema if no BM 11/15 moved bowels, constipation resolved  # History of Parkinson disease Currently patient not on Sinemet Advised to follow with neurology for further management as an outpatient Resumed Primatene and gabapentin  home dose  # Schizophrenia Continue to fluphenazine 1 mg p.o. 3 times daily home dose  # Gout, continue allopurinol  Body mass index is 39.47 kg/m.  Interventions:  Diet: Carb modified diet DVT Prophylaxis: Subcutaneous Lovenox    Advance goals of care discussion: Full code  Family Communication: family was not present at bedside, at the time of interview.  The pt provided permission to discuss medical plan with the family. Opportunity was given to ask question and all questions were answered satisfactorily.   Disposition:  Pt is from ALF, admitted with fall and sepsis, s/p IV antibiotics, transition to oral antibiotics today as there is no source of infection.   Discharge to ALF, clinically stable, medically optimized.  TOC consulted to check with ALF if they can accept the patient today.  Discharge med rec done and DC summary done   Subjective: No significant events overnight.  Patient was sitting comfortably on the recliner and she was eating  food.  Denied any active issues, no complaints   Physical Exam: General: NAD, lying comfortably Appear in no distress, affect  appropriate Eyes: PERRLA ENT: Oral Mucosa Clear, moist  Neck: no JVD,  Cardiovascular: S1 and S2 Present, no Murmur,  Respiratory: good respiratory effort, Bilateral Air entry equal and Decreased, no Crackles, no wheezes Abdomen: Bowel Sound present, Soft and no tenderness,  Skin: no rashes Extremities: no Pedal edema, no calf tenderness Neurologic: without any new focal findings, resting tremors noticed. Gait not checked due to patient safety concerns  Vitals:   12/09/23 2059 12/10/23 0542 12/10/23 0734 12/10/23 1547  BP: (!) 144/67 117/74 124/67 98/68  Pulse: 85 83 75 89  Resp: 16 16 17 18   Temp: 98 F (36.7 C) 98 F (36.7 C) 98.5 F (36.9 C) 98.2 F (36.8 C)  TempSrc:   Oral Oral  SpO2: 95% 94% 98% 99%  Weight:      Height:        Intake/Output Summary (Last 24 hours) at 12/10/2023 1631 Last data filed at 12/10/2023 0254 Gross per 24 hour  Intake 620 ml  Output --  Net 620 ml   Filed Weights   12/08/23 0647 12/08/23 0747  Weight: 104.3 kg 104.3 kg    Data Reviewed: I have personally reviewed and interpreted daily labs, tele strips, imagings as discussed above. I reviewed all nursing notes, pharmacy notes, vitals, pertinent old records I have discussed plan of care as described above with RN and patient/family.  CBC: Recent Labs  Lab 12/08/23 0659 12/09/23 0419 12/10/23 0442  WBC 10.9* 5.4 6.2  NEUTROABS 9.1*  --   --   HGB 12.2 11.7* 11.8*  HCT 39.2 37.9 37.8  MCV 70.5* 70.2* 70.3*  PLT 191 180 192   Basic Metabolic Panel: Recent Labs  Lab 12/08/23 0659 12/09/23 0419 12/10/23 0442  NA 139 140 137  K 4.1 3.7 4.0  CL 100 101 103  CO2 29 28 27   GLUCOSE 143* 130* 152*  BUN 10 8 8   CREATININE 0.98 0.81 0.66  CALCIUM 9.5 9.2 9.8  MG  --  2.4 2.4  PHOS  --  3.8 2.8    Studies: No results found.  Scheduled Meds:  allopurinol  100 mg Oral Daily   aspirin  EC  81 mg Oral Daily   bisacodyl   10 mg Oral QHS   ciprofloxacin  500 mg Oral BID    enoxaparin  (LOVENOX ) injection  50 mg Subcutaneous Q24H   ferrous sulfate  325 mg Oral Q breakfast   fluPHENAZine  1 mg Oral TID   folic acid  1 mg Oral Daily   gabapentin   100 mg Oral QHS   insulin  aspart  0-15 Units Subcutaneous TID WC   insulin  aspart  0-5 Units Subcutaneous QHS   insulin  glargine-yfgn  18 Units Subcutaneous QHS   mupirocin ointment   Nasal BID   pantoprazole   40 mg Oral Daily   polyethylene glycol  17 g Oral BID   primidone  12.5 mg Oral QHS   simvastatin   10 mg Oral Daily   Continuous Infusions:   PRN Meds: acetaminophen  **OR** acetaminophen , albuterol , bisacodyl , ondansetron  **OR** ondansetron  (ZOFRAN ) IV, oxyCODONE, traZODone   Time spent: 40 minutes  Author: ELVAN SOR. MD [facility] 12/10/2023 4:31 PM  To reach On-call, see care teams to locate the attending and reach out to them via [email or web address]. If 7PM-7AM, please contact night-coverage If you still have difficulty reaching the attending provider, please page the [facility] (  Director on Call) for [facility] on amion for assistance.

## 2023-12-10 NOTE — Plan of Care (Signed)
  Problem: Education: Goal: Ability to describe self-care measures that may prevent or decrease complications (Diabetes Survival Skills Education) will improve Outcome: Progressing Goal: Individualized Educational Video(s) Outcome: Progressing   Problem: Nutritional: Goal: Maintenance of adequate nutrition will improve Outcome: Progressing Goal: Progress toward achieving an optimal weight will improve Outcome: Progressing   Problem: Skin Integrity: Goal: Risk for impaired skin integrity will decrease Outcome: Progressing   Problem: Activity: Goal: Risk for activity intolerance will decrease Outcome: Progressing   Problem: Coping: Goal: Level of anxiety will decrease Outcome: Progressing   Problem: Safety: Goal: Ability to remain free from injury will improve Outcome: Progressing   Problem: Skin Integrity: Goal: Risk for impaired skin integrity will decrease Outcome: Progressing

## 2023-12-11 DIAGNOSIS — R6510 Systemic inflammatory response syndrome (SIRS) of non-infectious origin without acute organ dysfunction: Principal | ICD-10-CM

## 2023-12-11 DIAGNOSIS — E119 Type 2 diabetes mellitus without complications: Secondary | ICD-10-CM

## 2023-12-11 DIAGNOSIS — W19XXXA Unspecified fall, initial encounter: Secondary | ICD-10-CM

## 2023-12-11 DIAGNOSIS — I10 Essential (primary) hypertension: Secondary | ICD-10-CM

## 2023-12-11 LAB — CBC
HCT: 37.4 % (ref 36.0–46.0)
Hemoglobin: 11.7 g/dL — ABNORMAL LOW (ref 12.0–15.0)
MCH: 22 pg — ABNORMAL LOW (ref 26.0–34.0)
MCHC: 31.3 g/dL (ref 30.0–36.0)
MCV: 70.2 fL — ABNORMAL LOW (ref 80.0–100.0)
Platelets: 194 K/uL (ref 150–400)
RBC: 5.33 MIL/uL — ABNORMAL HIGH (ref 3.87–5.11)
RDW: 17.1 % — ABNORMAL HIGH (ref 11.5–15.5)
WBC: 6.9 K/uL (ref 4.0–10.5)
nRBC: 0 % (ref 0.0–0.2)

## 2023-12-11 LAB — BASIC METABOLIC PANEL WITH GFR
Anion gap: 6 (ref 5–15)
BUN: 7 mg/dL — ABNORMAL LOW (ref 8–23)
CO2: 28 mmol/L (ref 22–32)
Calcium: 9.7 mg/dL (ref 8.9–10.3)
Chloride: 105 mmol/L (ref 98–111)
Creatinine, Ser: 0.68 mg/dL (ref 0.44–1.00)
GFR, Estimated: >60 mL/min (ref >60)
Glucose, Bld: 129 mg/dL — ABNORMAL HIGH (ref 70–99)
Potassium: 5.2 mmol/L — ABNORMAL HIGH (ref 3.5–5.1)
Sodium: 140 mmol/L (ref 135–145)

## 2023-12-11 LAB — GLUCOSE, CAPILLARY: Glucose-Capillary: 129 mg/dL — ABNORMAL HIGH (ref 70–99)

## 2023-12-11 LAB — PHOSPHORUS: Phosphorus: 3.2 mg/dL (ref 2.5–4.6)

## 2023-12-11 LAB — MAGNESIUM: Magnesium: 2.5 mg/dL — ABNORMAL HIGH (ref 1.7–2.4)

## 2023-12-11 MED ORDER — SODIUM ZIRCONIUM CYCLOSILICATE 10 G PO PACK
10.0000 g | PACK | Freq: Every day | ORAL | Status: DC
  Administered 2023-12-11: 10 g via ORAL
  Filled 2023-12-11: qty 1

## 2023-12-11 NOTE — TOC Transition Note (Signed)
 Transition of Care [facility]) - Discharge Note   Patient Details  Name: Christy [name] MRN: [id number] Date of Birth: [birth year]  [facility]) CM/SW Contact:  Racheal LITTIE Schimke, RN Phone Number: 12/11/2023, 12:01 PM      Final next level of care: Assisted Living Barriers to Discharge: Barriers Resolved   Patient Goals and CMS Choice Patient states their goals for this hospitalization and ongoing recovery are:: Return to [facility]   Choice offered to / list presented to : NA      Discharge Placement                Patient to be transferred to facility by: ALF Staff Member Eboni Name of family member notified: Jon Patient and family notified of of transfer: 12/11/23  Discharge Plan and Services Additional resources added to the After Visit Summary for                  DME Arranged: N/A DME Agency: NA       HH Arranged: NA HH Agency: NA        Social Drivers of Health (SDOH) Interventions SDOH Screenings   Food Insecurity: No Food Insecurity (12/08/2023)  Housing: Low Risk  (12/08/2023)  Transportation Needs: No Transportation Needs (12/08/2023)  Utilities: Not At Risk (12/08/2023)  Social Connections: Socially Isolated (12/08/2023)  Tobacco Use: High Risk (12/08/2023)     Readmission Risk Interventions     No data to display

## 2023-12-11 NOTE — TOC Progression Note (Addendum)
 Transition of Care [facility]) - Progression Note    Patient Details  Name: Christy [name] MRN: [id number] Date of Birth: 21-Jun-1955  [facility]) CM/SW Contact  Racheal LITTIE Schimke, RN Phone Number: 12/11/2023, 9:33 AM  Clinical Narrative: Attempted to call [facility] Tammy left voice message to return call. Called relative who provided me with administrator number [phone], NIS. Called relative back to inform her, was given Eboni number [phone]. Spoke with Eboni who will pick patient up at noon today, will notify MD and RN.  9:50am: Eboni returned call, will transport patient back to the facility today at 12n. MD and RN notified.                     Expected Discharge Plan and Services         Expected Discharge Date: 12/10/23                                     Social Drivers of Health (SDOH) Interventions SDOH Screenings   Food Insecurity: No Food Insecurity (12/08/2023)  Housing: Low Risk  (12/08/2023)  Transportation Needs: No Transportation Needs (12/08/2023)  Utilities: Not At Risk (12/08/2023)  Social Connections: Socially Isolated (12/08/2023)  Tobacco Use: High Risk (12/08/2023)    Readmission Risk Interventions     No data to display

## 2023-12-11 NOTE — Progress Notes (Signed)
 [facility] Progress Note  Patient: Christy [name]    FMW:[id number]  DOA: 12/08/2023     Date of Service: the patient was seen and examined on 12/11/2023  Chief Complaint  Patient presents with   Fall   Brief hospital course: Christy [name] is a 68 y.o. female with medical history significant for non-insulin -dependent type 2 diabetes, GERD, hypertension, hyperlipidemia, schizophrenia and Parkinson's disease being admitted to the hospital with severe sepsis.  Patient lives in assisted living facility, this morning states that she fell out of bed, the mattress bounced her out of the bed.  She denies any recent illness, though she had a slight cough for the last 24 hours which was nonproductive.  She denies any known fevers, chills, nausea, vomiting, sick contacts.  Specifically denies any dysuria, abdominal pain, diarrhea or any other changes in bowel or bladder habits.  On arrival to emergency department, the patient was noted to be tachycardic, febrile and lab workup shows minimally elevated leukocytosis and lactic acid.  She was referred for hospitalist admission.  11/16: Hemodynamically stable, unable to leave yesterday as her ALS could not pick her up.  Mild hyperkalemia with potassium of 5.2 with normal renal functions today , she was given a dose of Lokelma and need to have a follow-up with primary care provider. You have Assessment and Plan:  # Severe sepsis-meeting criteria with fever, tachycardia, leukocytosis, suspected acute infection although source is not identified currently.  She does have a history of severe sepsis and ESBL UTI with bacteremia last year.  Initial lactate 2.1.  Extensive workup in the emergency department is negative for source. -Blood culture NGTD  - urine cultures No growth  -viral respiratory panel negative - Continue empiric meropenem  given her history of ESBL infection -s/p IV fluid resuscitation, trend lactic acid 2.1>>1.1 TTE: LVEF 55 -60%, no any  other significant findings. 11/15 patient remained afebrile, leukocytosis resolved, cultures negative so de-escalated antibiotics.  Discontinued meropenem . Started ciprofloxacin 500 mg p.o. twice daily for 5 days and discharged on doxycycline  100 mg p.o. twice daily for 5 days empirically.  Continue mupirocin for MRSA naris x 5 days  # Type 2 diabetes-not insulin -dependent HbA1c 7.6, well-controlled -Hold home glimepiride  and metformin  -Carb modified diet, with moderate dose sliding scale Semglee 18 units nightly   # Hypertension-> Hypotension  11/14 Held Toprol -XL, losartan  Monitor BP and titrate medications accordingly  # Hyperlipidemia-Zocor   # Constipation Started MiraLAX twice daily Dulcolax 10 mg p.o. nightly 11/14 as per patient she had BM more than 7 days ago Patient agreed for Dulcolax suppository followed by enema if no BM 11/15 moved bowels, constipation resolved  # History of Parkinson disease Currently patient not on Sinemet Advised to follow with neurology for further management as an outpatient Resumed Primatene and gabapentin  home dose  # Schizophrenia Continue to fluphenazine 1 mg p.o. 3 times daily home dose  # Gout, continue allopurinol  Body mass index is 39.47 kg/m.  Interventions:  Diet: Carb modified diet DVT Prophylaxis: Subcutaneous Lovenox    Advance goals of care discussion: Full code  Family Communication: family was not present at bedside, at the time of interview.    Disposition: ALF yeah yeah  Subjective: Patient was seen and examined today.  No new concern.  Physical Exam: General.  Obese lady, in no acute distress.  Significant tremors Pulmonary.  Lungs clear bilaterally, normal respiratory effort. CV.  Regular rate and rhythm, no JVD, rub or murmur. Abdomen.  Soft, nontender, nondistended, BS  positive. CNS.  Alert and oriented .  No focal neurologic deficit. Extremities.  No edema,  pulses intact and symmetrical.  Vitals:    12/10/23 1547 12/10/23 1947 12/11/23 0328 12/11/23 0808  BP: 98/68 (!) 150/88 (!) 139/97 (!) 101/36  Pulse: 89 78 81 73  Resp: 18 15 16 18   Temp: 98.2 F (36.8 C) 98.2 F (36.8 C) 98.1 F (36.7 C) 97.9 F (36.6 C)  TempSrc: Oral  Oral Oral  SpO2: 99% 99% 95% 95%  Weight:      Height:       No intake or output data in the 24 hours ending 12/11/23 1221  Filed Weights   12/08/23 0647 12/08/23 0747  Weight: 104.3 kg 104.3 kg    Data Reviewed: I have personally reviewed and interpreted daily labs, tele strips, imagings as discussed above. I reviewed all nursing notes, pharmacy notes, vitals, pertinent old records I have discussed plan of care as described above with RN and patient/family.  CBC: Recent Labs  Lab 12/08/23 0659 12/09/23 0419 12/10/23 0442 12/11/23 0527  WBC 10.9* 5.4 6.2 6.9  NEUTROABS 9.1*  --   --   --   HGB 12.2 11.7* 11.8* 11.7*  HCT 39.2 37.9 37.8 37.4  MCV 70.5* 70.2* 70.3* 70.2*  PLT 191 180 192 194   Basic Metabolic Panel: Recent Labs  Lab 12/08/23 0659 12/09/23 0419 12/10/23 0442 12/11/23 0527  NA 139 140 137 140  K 4.1 3.7 4.0 5.2*  CL 100 101 103 105  CO2 29 28 27 28   GLUCOSE 143* 130* 152* 129*  BUN 10 8 8  7*  CREATININE 0.98 0.81 0.66 0.68  CALCIUM 9.5 9.2 9.8 9.7  MG  --  2.4 2.4 2.5*  PHOS  --  3.8 2.8 3.2    Studies: No results found.  Scheduled Meds:  allopurinol  100 mg Oral Daily   aspirin  EC  81 mg Oral Daily   bisacodyl   10 mg Oral QHS   ciprofloxacin  500 mg Oral BID   enoxaparin  (LOVENOX ) injection  50 mg Subcutaneous Q24H   ferrous sulfate  325 mg Oral Q breakfast   fluPHENAZine  1 mg Oral TID   folic acid  1 mg Oral Daily   gabapentin   100 mg Oral QHS   insulin  aspart  0-15 Units Subcutaneous TID WC   insulin  aspart  0-5 Units Subcutaneous QHS   insulin  glargine-yfgn  18 Units Subcutaneous QHS   mupirocin ointment   Nasal BID   pantoprazole   40 mg Oral Daily   polyethylene glycol  17 g Oral BID   primidone   12.5 mg Oral QHS   simvastatin   10 mg Oral Daily   sodium zirconium cyclosilicate  10 g Oral Daily   Continuous Infusions:   PRN Meds: acetaminophen  **OR** acetaminophen , albuterol , bisacodyl , ondansetron  **OR** ondansetron  (ZOFRAN ) IV, oxyCODONE, traZODone   Time spent: 40 minutes  Author: Amaryllis Dare, MD [facility] 12/11/2023 12:21 PM  To reach On-call, see care teams to locate the attending and reach out to them via [email or web address]. If 7PM-7AM, please contact night-coverage If you still have difficulty reaching the attending provider, please page the [facility] (Director on Call) for [facility] on amion for assistance.

## 2023-12-11 NOTE — Plan of Care (Signed)

## 2023-12-13 LAB — CULTURE, BLOOD (ROUTINE X 2)
Culture: NO GROWTH
Culture: NO GROWTH
Special Requests: ADEQUATE

## 2024-02-01 ENCOUNTER — Emergency Department
Admission: EM | Admit: 2024-02-01 | Discharge: 2024-02-02 | Disposition: A | Discharge status: Nursing Home (Includes ICF) | Attending: Emergency Medicine | Admitting: Emergency Medicine

## 2024-02-01 ENCOUNTER — Emergency Department

## 2024-02-01 ENCOUNTER — Encounter: Payer: Self-pay | Admitting: *Deleted

## 2024-02-01 ENCOUNTER — Other Ambulatory Visit: Payer: Self-pay

## 2024-02-01 DIAGNOSIS — R112 Nausea with vomiting, unspecified: Secondary | ICD-10-CM | POA: Insufficient documentation

## 2024-02-01 DIAGNOSIS — K5900 Constipation, unspecified: Secondary | ICD-10-CM | POA: Insufficient documentation

## 2024-02-01 DIAGNOSIS — D72829 Elevated white blood cell count, unspecified: Secondary | ICD-10-CM | POA: Insufficient documentation

## 2024-02-01 DIAGNOSIS — R109 Unspecified abdominal pain: Secondary | ICD-10-CM | POA: Diagnosis present

## 2024-02-01 LAB — COMPREHENSIVE METABOLIC PANEL WITH GFR
ALT: 9 U/L (ref 0–44)
AST: 15 U/L (ref 15–41)
Albumin: 4.3 g/dL (ref 3.5–5.0)
Alkaline Phosphatase: 103 U/L (ref 38–126)
Anion gap: 11 (ref 5–15)
BUN: 11 mg/dL (ref 8–23)
CO2: 29 mmol/L (ref 22–32)
Calcium: 10.2 mg/dL (ref 8.9–10.3)
Chloride: 98 mmol/L (ref 98–111)
Creatinine, Ser: 0.82 mg/dL (ref 0.44–1.00)
GFR, Estimated: >60 mL/min
Glucose, Bld: 178 mg/dL — ABNORMAL HIGH (ref 70–99)
Potassium: 3.8 mmol/L (ref 3.5–5.1)
Sodium: 138 mmol/L (ref 135–145)
Total Bilirubin: 0.8 mg/dL (ref 0.0–1.2)
Total Protein: 7.1 g/dL (ref 6.5–8.1)

## 2024-02-01 LAB — URINALYSIS, ROUTINE W REFLEX MICROSCOPIC
Bilirubin Urine: NEGATIVE
Glucose, UA: NEGATIVE mg/dL
Hgb urine dipstick: NEGATIVE
Ketones, ur: 5 mg/dL — AB
Leukocytes,Ua: NEGATIVE
Nitrite: NEGATIVE
Protein, ur: NEGATIVE mg/dL
Specific Gravity, Urine: 1.016 (ref 1.005–1.030)
pH: 7 (ref 5.0–8.0)

## 2024-02-01 LAB — CBC
HCT: 41.7 % (ref 36.0–46.0)
Hemoglobin: 12.9 g/dL (ref 12.0–15.0)
MCH: 21.8 pg — ABNORMAL LOW (ref 26.0–34.0)
MCHC: 30.9 g/dL (ref 30.0–36.0)
MCV: 70.6 fL — ABNORMAL LOW (ref 80.0–100.0)
Platelets: 214 K/uL (ref 150–400)
RBC: 5.91 MIL/uL — ABNORMAL HIGH (ref 3.87–5.11)
RDW: 16.6 % — ABNORMAL HIGH (ref 11.5–15.5)
WBC: 11.1 K/uL — ABNORMAL HIGH (ref 4.0–10.5)
nRBC: 0 % (ref 0.0–0.2)

## 2024-02-01 LAB — LIPASE, BLOOD: Lipase: 12 U/L (ref 11–51)

## 2024-02-01 MED ORDER — IOHEXOL 350 MG/ML SOLN
100.0000 mL | Freq: Once | INTRAVENOUS | Status: AC | PRN
  Administered 2024-02-01: 100 mL via INTRAVENOUS

## 2024-02-01 NOTE — ED Triage Notes (Signed)
 First Nurse Note: Pt via [facility] from [facility]. States the facility has been having a GI virus going around. Pt c/o NV. Pt is A&Ox4 and NAD  EMS reports:  150/90 BP 250 CBG  110  20 RR 96% on RA

## 2024-02-01 NOTE — ED Provider Notes (Signed)
 "  [facility] Provider Note    Event Date/Time   First MD Initiated Contact with Patient 02/01/24 2128     (approximate)   History   Abdominal Pain and Emesis   HPI  Christy [name] is a 69 y.o. female who presents from living facility today because of concerns for nausea vomiting and abdominal pain.  Patient states that her symptoms started this morning.  She has had multiple episodes of nonbloody emesis.  She denies any associated diarrhea.  This has been accompanied by upper abdominal pain.  Is across her whole upper abdomen.  She denies any fevers although states she had some chills today.  She does state that there is a stomach bug going around her facility.     Physical Exam   Triage Vital Signs: ED Triage Vitals  Encounter Vitals Group     BP 02/01/24 1650 (!) 154/78     Girls Systolic BP Percentile --      Girls Diastolic BP Percentile --      Boys Systolic BP Percentile --      Boys Diastolic BP Percentile --      Pulse Rate 02/01/24 1650 (!) 103     Resp 02/01/24 1650 18     Temp 02/01/24 1650 99.2 F (37.3 C)     Temp Source 02/01/24 1650 Oral     SpO2 02/01/24 1650 95 %     Weight 02/01/24 1647 260 lb (117.9 kg)     Height 02/01/24 1647 5' 4 (1.626 m)     Head Circumference --      Peak Flow --      Pain Score 02/01/24 1647 10     Pain Loc --      Pain Education --      Exclude from Growth Chart --     Most recent vital signs: Vitals:   02/01/24 1650  BP: (!) 154/78  Pulse: (!) 103  Resp: 18  Temp: 99.2 F (37.3 C)  SpO2: 95%   General: Awake, alert, oriented. CV:  Good peripheral perfusion. Regular rate and rhythm. Resp:  Normal effort. Lungs clear. Abd:  No distention. Tender to palpation in the upper abdomen.  ED Results / Procedures / Treatments   Labs (all labs ordered are listed, but only abnormal results are displayed) Labs Reviewed  COMPREHENSIVE METABOLIC PANEL WITH GFR - Abnormal; Notable for the following  components:      Result Value   Glucose, Bld 178 (*)    All other components within normal limits  CBC - Abnormal; Notable for the following components:   WBC 11.1 (*)    RBC 5.91 (*)    MCV 70.6 (*)    MCH 21.8 (*)    RDW 16.6 (*)    All other components within normal limits  LIPASE, BLOOD  URINALYSIS, ROUTINE W REFLEX MICROSCOPIC     EKG  None   RADIOLOGY CT pending at time of sign out.   PROCEDURES:  Critical Care performed: No    MEDICATIONS ORDERED IN ED: Medications - No data to display   IMPRESSION / MDM / ASSESSMENT AND PLAN / ED COURSE  I reviewed the triage vital signs and the nursing notes.                              Differential diagnosis includes, but is not limited to, gastroenteritis, SBO, pancreatitis, hepatitis  Patient's  presentation is most consistent with acute presentation with potential threat to life or bodily function.   Patient presented to the emergency department today from living facility because of concerns for nausea vomiting and abdominal pain.  On exam patient does have tenderness to the upper abdomen but.  Her abdomen is soft.  Patient without significant leukocytosis.  Given abdominal tenderness and vomiting will obtain CT scan.  CT scan pending at time of sign out.   FINAL CLINICAL IMPRESSION(S) / ED DIAGNOSES   Final diagnoses:  Nausea and vomiting, unspecified vomiting type  Abdominal pain, unspecified abdominal location    Note:  This document was prepared using Dragon voice recognition software and may include unintentional dictation errors.MERLINDA Floy Roberts, MD 02/01/24 2328  "

## 2024-02-01 NOTE — ED Triage Notes (Addendum)
 Pt brought in via ems from [facility].  Pt has abd pain with vomiting.  No diarrhea  Pt states gi virus is going around  pt alert.

## 2024-02-01 NOTE — ED Provider Notes (Signed)
 11:00 PM  Assumed care at shift change.  Patient here with abdominal pain, vomiting.  CT abdomen pelvis pending.  12:15 AM Pt's CT scan reviewed and interpreted by myself and the radiologist and shows no acute abnormality.  She does have constipation.  Will discharge on MiraLAX  and Colace.  She has wall thickening of her bladder but urine does not appear infected today.  I feel she is safe to be discharged.  She is drinking here without difficulty.  Will discharge with prescription Zofran .   Lasaro Primm, Josette SAILOR, DO 02/02/24 [phone]

## 2024-02-02 ENCOUNTER — Other Ambulatory Visit: Payer: Self-pay

## 2024-02-02 ENCOUNTER — Other Ambulatory Visit (HOSPITAL_COMMUNITY): Payer: Self-pay

## 2024-02-02 MED ORDER — POLYETHYLENE GLYCOL 3350 17 G PO PACK: 17.0000 g | PACK | Freq: Two times a day (BID) | ORAL | 0 refills | Status: AC

## 2024-02-02 MED ORDER — ONDANSETRON 4 MG PO TBDP: 4.0000 mg | ORAL_TABLET | Freq: Four times a day (QID) | ORAL | 0 refills | Status: AC | PRN

## 2024-02-02 MED ORDER — DOCUSATE SODIUM 100 MG PO CAPS: 100.0000 mg | ORAL_CAPSULE | Freq: Two times a day (BID) | ORAL | 0 refills | Status: AC

## 2024-02-02 NOTE — ED Notes (Signed)
 Called back to [facility], spoke w/ Mable again who advised that they were wanting [facility] to arrange transport for pt to come back to their facility.

## 2024-02-02 NOTE — ED Notes (Signed)
 [facility] here to pick up pt and take her back to [facility].

## 2024-02-02 NOTE — ED Notes (Signed)
 Called [facility] @ [phone] and spoke w/ Mable Sickle.  Telephone report given to facility, and made them aware that pt was being discharged back to them.  Mable advised that she was going to call her on call administrator and see if there was someone available to come and pick pt up from the ER.  Tasia asked for me to call her back in about 5-10 mins.  Will call back for transportation update for pt's discharge.

## 2024-02-02 NOTE — Discharge Instructions (Addendum)
 You may take Tylenol  over-the-counter 1000 mg every 6 hours as needed for pain.   I recommend that you increase your water and fiber intake. If you are not able to eat foods high in fiber, you may use Benefiber or Metamucil over-the-counter. I also recommend you use MiraLAX  1-2 times a day and Colace 100 mg twice a day to help with bowel movements. These medications are over the counter.  You may use other over-the-counter medications such as Dulcolax, Fleet enemas, magnesium  citrate as needed for constipation. Please note that some of these medications may cause you to have abdominal cramping which is normal. If you develop severe abdominal pain, fever (temperature of 100.4 or higher), persistent vomiting, distention of your abdomen, unable to have a bowel movement for 5 days or are not passing gas, please return to the hospital.
